# Patient Record
Sex: Female | Born: 1950
Health system: Southern US, Community
[De-identification: ages and names within clinical notes are randomized; demographics above are authoritative.]

## PROBLEM LIST (undated history)

## (undated) DIAGNOSIS — B002 Herpesviral gingivostomatitis and pharyngotonsillitis: Secondary | ICD-10-CM

## (undated) DIAGNOSIS — J309 Allergic rhinitis, unspecified: Secondary | ICD-10-CM

## (undated) DIAGNOSIS — E039 Hypothyroidism, unspecified: Secondary | ICD-10-CM

## (undated) DIAGNOSIS — T7840XA Allergy, unspecified, initial encounter: Secondary | ICD-10-CM

## (undated) DIAGNOSIS — J449 Chronic obstructive pulmonary disease, unspecified: Secondary | ICD-10-CM

## (undated) DIAGNOSIS — H269 Unspecified cataract: Secondary | ICD-10-CM

## (undated) DIAGNOSIS — X32XXXA Exposure to sunlight, initial encounter: Secondary | ICD-10-CM

## (undated) DIAGNOSIS — Z8709 Personal history of other diseases of the respiratory system: Secondary | ICD-10-CM

## (undated) DIAGNOSIS — M8580 Other specified disorders of bone density and structure, unspecified site: Secondary | ICD-10-CM

## (undated) DIAGNOSIS — J45909 Unspecified asthma, uncomplicated: Secondary | ICD-10-CM

## (undated) DIAGNOSIS — T783XXA Angioneurotic edema, initial encounter: Secondary | ICD-10-CM

## (undated) DIAGNOSIS — R911 Solitary pulmonary nodule: Secondary | ICD-10-CM

## (undated) HISTORY — DX: Hypothyroidism, unspecified: E03.9

## (undated) HISTORY — DX: Allergic rhinitis, unspecified: J30.9

## (undated) HISTORY — DX: Allergy, unspecified, initial encounter: T78.40XA

## (undated) HISTORY — PX: COLONOSCOPY: SHX174

## (undated) HISTORY — DX: Angioneurotic edema, initial encounter: T78.3XXA

## (undated) HISTORY — DX: Unspecified cataract: H26.9

## (undated) HISTORY — DX: Other specified disorders of bone density and structure, unspecified site: M85.80

## (undated) HISTORY — PX: OTHER SURGICAL HISTORY: SHX169

## (undated) HISTORY — DX: Chronic obstructive pulmonary disease, unspecified: J44.9

## (undated) HISTORY — DX: Herpesviral gingivostomatitis and pharyngotonsillitis: B00.2

## (undated) HISTORY — DX: Solitary pulmonary nodule: R91.1

## (undated) HISTORY — DX: Exposure to sunlight, initial encounter: X32.XXXA

## (undated) HISTORY — DX: Unspecified asthma, uncomplicated: J45.909

## (undated) HISTORY — DX: Personal history of other diseases of the respiratory system: Z87.09

---
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
## 2006-01-23 DIAGNOSIS — B002 Herpesviral gingivostomatitis and pharyngotonsillitis: Secondary | ICD-10-CM

## 2006-01-23 HISTORY — DX: Herpesviral gingivostomatitis and pharyngotonsillitis: B00.2

## 2008-05-14 LAB — HM MAMMOGRAPHY

## 2009-11-25 LAB — HM MAMMOGRAPHY

## 2011-01-24 DIAGNOSIS — E039 Hypothyroidism, unspecified: Secondary | ICD-10-CM

## 2011-01-24 HISTORY — DX: Hypothyroidism, unspecified: E03.9

## 2011-02-09 LAB — HM MAMMOGRAPHY

## 2011-07-11 DIAGNOSIS — T783XXA Angioneurotic edema, initial encounter: Secondary | ICD-10-CM

## 2011-07-11 HISTORY — DX: Angioneurotic edema, initial encounter: T78.3XXA

## 2012-03-19 LAB — HM MAMMOGRAPHY

## 2012-03-26 LAB — HM MAMMOGRAPHY

## 2013-08-06 ENCOUNTER — Encounter: Payer: Self-pay | Admitting: Internal Medicine

## 2013-09-03 ENCOUNTER — Encounter: Payer: Self-pay | Admitting: Internal Medicine

## 2013-09-11 ENCOUNTER — Ambulatory Visit: Payer: Self-pay | Admitting: Internal Medicine

## 2013-11-06 ENCOUNTER — Ambulatory Visit (INDEPENDENT_AMBULATORY_CARE_PROVIDER_SITE_OTHER): Payer: BC Managed Care – PPO | Admitting: Internal Medicine

## 2013-11-06 ENCOUNTER — Encounter: Payer: Self-pay | Admitting: Internal Medicine

## 2013-11-06 ENCOUNTER — Other Ambulatory Visit (INDEPENDENT_AMBULATORY_CARE_PROVIDER_SITE_OTHER): Payer: BC Managed Care – PPO

## 2013-11-06 VITALS — BP 152/88 | HR 68 | Temp 97.7°F | Ht 66.0 in | Wt 127.5 lb

## 2013-11-06 DIAGNOSIS — Z0000 Encounter for general adult medical examination without abnormal findings: Secondary | ICD-10-CM

## 2013-11-06 DIAGNOSIS — R030 Elevated blood-pressure reading, without diagnosis of hypertension: Secondary | ICD-10-CM | POA: Insufficient documentation

## 2013-11-06 DIAGNOSIS — Z124 Encounter for screening for malignant neoplasm of cervix: Secondary | ICD-10-CM

## 2013-11-06 DIAGNOSIS — B002 Herpesviral gingivostomatitis and pharyngotonsillitis: Secondary | ICD-10-CM | POA: Insufficient documentation

## 2013-11-06 DIAGNOSIS — IMO0001 Reserved for inherently not codable concepts without codable children: Secondary | ICD-10-CM

## 2013-11-06 DIAGNOSIS — E039 Hypothyroidism, unspecified: Secondary | ICD-10-CM

## 2013-11-06 LAB — CBC WITH DIFFERENTIAL/PLATELET
Basophils Absolute: 0 10*3/uL (ref 0.0–0.1)
Basophils Relative: 0.6 % (ref 0.0–3.0)
Eosinophils Absolute: 0.4 10*3/uL (ref 0.0–0.7)
Eosinophils Relative: 6.2 % — ABNORMAL HIGH (ref 0.0–5.0)
HCT: 45.3 % (ref 36.0–46.0)
Hemoglobin: 15.3 g/dL — ABNORMAL HIGH (ref 12.0–15.0)
Lymphocytes Relative: 20.9 % (ref 12.0–46.0)
Lymphs Abs: 1.2 10*3/uL (ref 0.7–4.0)
MCHC: 33.7 g/dL (ref 30.0–36.0)
MCV: 92.8 fl (ref 78.0–100.0)
Monocytes Absolute: 0.5 10*3/uL (ref 0.1–1.0)
Monocytes Relative: 8.5 % (ref 3.0–12.0)
Neutro Abs: 3.7 10*3/uL (ref 1.4–7.7)
Neutrophils Relative %: 63.8 % (ref 43.0–77.0)
Platelets: 245 10*3/uL (ref 150.0–400.0)
RBC: 4.89 Mil/uL (ref 3.87–5.11)
RDW: 12.9 % (ref 11.5–15.5)
WBC: 5.7 10*3/uL (ref 4.0–10.5)

## 2013-11-06 LAB — LIPID PANEL
Cholesterol: 206 mg/dL — ABNORMAL HIGH (ref 0–200)
HDL: 72.9 mg/dL (ref >39.00)
LDL Cholesterol: 122 mg/dL — ABNORMAL HIGH (ref 0–99)
NonHDL: 133.1
Total CHOL/HDL Ratio: 3
Triglycerides: 55 mg/dL (ref 0.0–149.0)
VLDL: 11 mg/dL (ref 0.0–40.0)

## 2013-11-06 LAB — T4, FREE: Free T4: 1.17 ng/dL (ref 0.60–1.60)

## 2013-11-06 LAB — URINALYSIS, ROUTINE W REFLEX MICROSCOPIC
Bilirubin Urine: NEGATIVE
Ketones, ur: NEGATIVE
Leukocytes, UA: NEGATIVE
Nitrite: NEGATIVE
Specific Gravity, Urine: <=1.005 — AB (ref 1.000–1.030)
Total Protein, Urine: NEGATIVE
Urine Glucose: NEGATIVE
Urobilinogen, UA: 0.2 (ref 0.0–1.0)
pH: 7 (ref 5.0–8.0)

## 2013-11-06 LAB — BASIC METABOLIC PANEL
BUN: 13 mg/dL (ref 6–23)
CO2: 24 mEq/L (ref 19–32)
Calcium: 9.4 mg/dL (ref 8.4–10.5)
Chloride: 105 mEq/L (ref 96–112)
Creatinine, Ser: 0.7 mg/dL (ref 0.4–1.2)
GFR: 86.59 mL/min (ref >60.00)
Glucose, Bld: 104 mg/dL — ABNORMAL HIGH (ref 70–99)
Potassium: 3.9 mEq/L (ref 3.5–5.1)
Sodium: 141 mEq/L (ref 135–145)

## 2013-11-06 LAB — HEPATIC FUNCTION PANEL
ALT: 25 U/L (ref 0–35)
AST: 28 U/L (ref 0–37)
Albumin: 4.1 g/dL (ref 3.5–5.2)
Alkaline Phosphatase: 59 U/L (ref 39–117)
Bilirubin, Direct: 0.2 mg/dL (ref 0.0–0.3)
Total Bilirubin: 1.1 mg/dL (ref 0.2–1.2)
Total Protein: 7.5 g/dL (ref 6.0–8.3)

## 2013-11-06 LAB — TSH: TSH: 2.64 u[IU]/mL (ref 0.35–4.50)

## 2013-11-06 NOTE — Patient Instructions (Addendum)
It was good to see you today.  We have reviewed your prior records including labs and tests today  Your annual flu shot was given and/or updated today.  We'll have you screened for colon cancer with COLOGUARD (stool DNA testing) - this packet will be sent to your home by the testing company. Complete instructions as in the box, and we will contact you with the results once available.  Other Health Maintenance reviewed - all other recommended immunizations and age-appropriate screenings are up-to-date.  Test(s) ordered today. Your results will be released to [facility] (or called to you) after review, usually within 72hours after test completion. If any changes need to be made, you will be notified at that same time.  Medications reviewed and updated, no changes recommended at this time. Will look to reduce medications as we are able  we'll make referral to gynecology (or let us know who you would like to see). Our office will contact you regarding appointment(s) once made.  Monitor your blood pressure at home and call if >140/85  Please schedule followup in 12 months for annual exam and labs, call sooner if problems.   Health Maintenance Adopting a healthy lifestyle and getting preventive care can go a long way to promote health and wellness. Talk with your health care provider about what schedule of regular examinations is right for you. This is a good chance for you to check in with your provider about disease prevention and staying healthy. In between checkups, there are plenty of things you can do on your own. Experts have done a lot of research about which lifestyle changes and preventive measures are most likely to keep you healthy. Ask your health care provider for more information. WEIGHT AND DIET  Eat a healthy diet  Be sure to include plenty of vegetables, fruits, low-fat dairy products, and lean protein.  Do not eat a lot of foods high in solid fats, added sugars, or  salt.  Get regular exercise. This is one of the most important things you can do for your health.  Most adults should exercise for at least 150 minutes each week. The exercise should increase your heart rate and make you sweat (moderate-intensity exercise).  Most adults should also do strengthening exercises at least twice a week. This is in addition to the moderate-intensity exercise.  Maintain a healthy weight  Body mass index (BMI) is a measurement that can be used to identify possible weight problems. It estimates body fat based on height and weight. Your health care provider can help determine your BMI and help you achieve or maintain a healthy weight.  For females 43 years of age and older:   A BMI below 18.5 is considered underweight.  A BMI of 18.5 to 24.9 is normal.  A BMI of 25 to 29.9 is considered overweight.  A BMI of 30 and above is considered obese.  Watch levels of cholesterol and blood lipids  You should start having your blood tested for lipids and cholesterol at 64 years of age, then have this test every 5 years.  You may need to have your cholesterol levels checked more often if:  Your lipid or cholesterol levels are high.  You are older than 64 years of age.  You are at high risk for heart disease.  CANCER SCREENING   Lung Cancer  Lung cancer screening is recommended for adults 61-93 years old who are at high risk for lung cancer because of a history of smoking.  A yearly low-dose CT scan of the lungs is recommended for people who:  Currently smoke.  Have quit within the past 15 years.  Have at least a 30-pack-year history of smoking. A pack year is smoking an average of one pack of cigarettes a day for 1 year.  Yearly screening should continue until it has been 15 years since you quit.  Yearly screening should stop if you develop a health problem that would prevent you from having lung cancer treatment.  Breast Cancer  Practice breast  self-awareness. This means understanding how your breasts normally appear and feel.  It also means doing regular breast self-exams. Let your health care provider know about any changes, no matter how small.  If you are in your 20s or 30s, you should have a clinical breast exam (CBE) by a health care provider every 1-3 years as part of a regular health exam.  If you are 46 or older, have a CBE every year. Also consider having a breast X-ray (mammogram) every year.  If you have a family history of breast cancer, talk to your health care provider about genetic screening.  If you are at high risk for breast cancer, talk to your health care provider about having an MRI and a mammogram every year.  Breast cancer gene (BRCA) assessment is recommended for women who have family members with BRCA-related cancers. BRCA-related cancers include:  Breast.  Ovarian.  Tubal.  Peritoneal cancers.  Results of the assessment will determine the need for genetic counseling and BRCA1 and BRCA2 testing. Cervical Cancer Routine pelvic examinations to screen for cervical cancer are no longer recommended for nonpregnant women who are considered low risk for cancer of the pelvic organs (ovaries, uterus, and vagina) and who do not have symptoms. A pelvic examination may be necessary if you have symptoms including those associated with pelvic infections. Ask your health care provider if a screening pelvic exam is right for you.   The Pap test is the screening test for cervical cancer for women who are considered at risk.  If you had a hysterectomy for a problem that was not cancer or a condition that could lead to cancer, then you no longer need Pap tests.  If you are older than 65 years, and you have had normal Pap tests for the past 10 years, you no longer need to have Pap tests.  If you have had past treatment for cervical cancer or a condition that could lead to cancer, you need Pap tests and screening for  cancer for at least 20 years after your treatment.  If you no longer get a Pap test, assess your risk factors if they change (such as having a new sexual partner). This can affect whether you should start being screened again.  Some women have medical problems that increase their chance of getting cervical cancer. If this is the case for you, your health care provider may recommend more frequent screening and Pap tests.  The human papillomavirus (HPV) test is another test that may be used for cervical cancer screening. The HPV test looks for the virus that can cause cell changes in the cervix. The cells collected during the Pap test can be tested for HPV.  The HPV test can be used to screen women 47 years of age and older. Getting tested for HPV can extend the interval between normal Pap tests from three to five years.  An HPV test also should be used to screen women of any  age who have unclear Pap test results.  After 64 years of age, women should have HPV testing as often as Pap tests.  Colorectal Cancer  This type of cancer can be detected and often prevented.  Routine colorectal cancer screening usually begins at 64 years of age and continues through 63 years of age.  Your health care provider may recommend screening at an earlier age if you have risk factors for colon cancer.  Your health care provider may also recommend using home test kits to check for hidden blood in the stool.  A small camera at the end of a tube can be used to examine your colon directly (sigmoidoscopy or colonoscopy). This is done to check for the earliest forms of colorectal cancer.  Routine screening usually begins at age 72.  Direct examination of the colon should be repeated every 5-10 years through 64 years of age. However, you may need to be screened more often if early forms of precancerous polyps or small growths are found. Skin Cancer  Check your skin from head to toe regularly.  Tell your health  care provider about any new moles or changes in moles, especially if there is a change in a mole's shape or color.  Also tell your health care provider if you have a mole that is larger than the size of a pencil eraser.  Always use sunscreen. Apply sunscreen liberally and repeatedly throughout the day.  Protect yourself by wearing long sleeves, pants, a wide-brimmed hat, and sunglasses whenever you are outside. HEART DISEASE, DIABETES, AND HIGH BLOOD PRESSURE   Have your blood pressure checked at least every 1-2 years. High blood pressure causes heart disease and increases the risk of stroke.  If you are between 60 years and 81 years old, ask your health care provider if you should take aspirin to prevent strokes.  Have regular diabetes screenings. This involves taking a blood sample to check your fasting blood sugar level.  If you are at a normal weight and have a low risk for diabetes, have this test once every three years after 64 years of age.  If you are overweight and have a high risk for diabetes, consider being tested at a younger age or more often. PREVENTING INFECTION  Hepatitis B  If you have a higher risk for hepatitis B, you should be screened for this virus. You are considered at high risk for hepatitis B if:  You were born in a country where hepatitis B is common. Ask your health care provider which countries are considered high risk.  Your parents were born in a high-risk country, and you have not been immunized against hepatitis B (hepatitis B vaccine).  You have HIV or AIDS.  You use needles to inject street drugs.  You live with someone who has hepatitis B.  You have had sex with someone who has hepatitis B.  You get hemodialysis treatment.  You take certain medicines for conditions, including cancer, organ transplantation, and autoimmune conditions. Hepatitis C  Blood testing is recommended for:  Everyone born from [birth year] through 1966.  Anyone with known  risk factors for hepatitis C. Sexually transmitted infections (STIs)  You should be screened for sexually transmitted infections (STIs) including gonorrhea and chlamydia if:  You are sexually active and are younger than 64 years of age.  You are older than 64 years of age and your health care provider tells you that you are at risk for this type of infection.  Your sexual  activity has changed since you were last screened and you are at an increased risk for chlamydia or gonorrhea. Ask your health care provider if you are at risk.  If you do not have HIV, but are at risk, it may be recommended that you take a prescription medicine daily to prevent HIV infection. This is called pre-exposure prophylaxis (PrEP). You are considered at risk if:  You are sexually active and do not regularly use condoms or know the HIV status of your partner(s).  You take drugs by injection.  You are sexually active with a partner who has HIV. Talk with your health care provider about whether you are at high risk of being infected with HIV. If you choose to begin PrEP, you should first be tested for HIV. You should then be tested every 3 months for as long as you are taking PrEP.  PREGNANCY   If you are premenopausal and you may become pregnant, ask your health care provider about preconception counseling.  If you may become pregnant, take 400 to 800 micrograms (mcg) of folic acid every day.  If you want to prevent pregnancy, talk to your health care provider about birth control (contraception). OSTEOPOROSIS AND MENOPAUSE   Osteoporosis is a disease in which the bones lose minerals and strength with aging. This can result in serious bone fractures. Your risk for osteoporosis can be identified using a bone density scan.  If you are 27 years of age or older, or if you are at risk for osteoporosis and fractures, ask your health care provider if you should be screened.  Ask your health care provider whether you  should take a calcium or vitamin D supplement to lower your risk for osteoporosis.  Menopause may have certain physical symptoms and risks.  Hormone replacement therapy may reduce some of these symptoms and risks. Talk to your health care provider about whether hormone replacement therapy is right for you.  HOME CARE INSTRUCTIONS   Schedule regular health, dental, and eye exams.  Stay current with your immunizations.   Do not use any tobacco products including cigarettes, chewing tobacco, or electronic cigarettes.  If you are pregnant, do not drink alcohol.  If you are breastfeeding, limit how much and how often you drink alcohol.  Limit alcohol intake to no more than 1 drink per day for nonpregnant women. One drink equals 12 ounces of beer, 5 ounces of wine, or 1 ounces of hard liquor.  Do not use street drugs.  Do not share needles.  Ask your health care provider for help if you need support or information about quitting drugs.  Tell your health care provider if you often feel depressed.  Tell your health care provider if you have ever been abused or do not feel safe at home. Document Released: 07/25/2010 Document Revised: 05/26/2013 Document Reviewed: 12/11/2012 [facility] Patient Information 2015 [place], [place]. This information is not intended to replace advice given to you by your health care provider. Make sure you discuss any questions you have with your health care provider.

## 2013-11-06 NOTE — Progress Notes (Signed)
Pre visit review using our clinic review tool, if applicable. No additional management support is needed unless otherwise documented below in the visit note. 

## 2013-11-06 NOTE — Assessment & Plan Note (Signed)
Remains unchanged on recheck Suspect element of whitecoat hypertension as patient reports home blood pressure 120s over 70s Patient will continue to monitor at home and send copy of log via my chart Patient agrees to contact us if systolic greater than 756 and given family history of hypertension

## 2013-11-06 NOTE — Progress Notes (Signed)
Subjective:    Patient ID: Tina Lucas, female    DOB: 07-30-1950, 63 y.o.   MRN: [id number]  HPI  patient is here today for annual physical. Patient feels well and has no complaints.  Also reviewed chronic medical issues and interval medical events Would very much like to reduce prescription medications, specifically thyroid Embraces holistic approach to health -physically active with conscientious diet  Past Medical History  Diagnosis Date  . Hypothyroid 2013  . History of asthma 1956-2000  . 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 Primary HSV infection of mouth 2008  . Allergic rhinitis    Family History  Problem Relation Age of Onset  . Osteoarthritis Father   . Hypertension Father   . Breast cancer Mother 49  . Leukemia Maternal Aunt 45   History  Substance Use Topics  . Smoking status: Not on file  . Smokeless tobacco: Not on file  . Alcohol Use: Not on file    Review of Systems  Constitutional: Negative for fatigue and unexpected weight change.  Respiratory: Negative for cough, shortness of breath and wheezing.   Cardiovascular: Negative for chest pain, palpitations and leg swelling.  Gastrointestinal: Negative for nausea, abdominal pain and diarrhea.  Neurological: Negative for dizziness, weakness, light-headedness and headaches.  Psychiatric/Behavioral: Negative for dysphoric mood. The patient is not nervous/anxious.   All other systems reviewed and are negative.      Objective:   Physical Exam  BP 152/88  Pulse 68  Temp(Src) 97.7 F (36.5 C) (Oral)  Ht 5\' 6"  (1.676 m)  Wt 127 lb 8 oz (57.834 kg)  BMI 20.59 kg/m2  SpO2 97% Wt Readings from Last 3 Encounters:  11/06/13 127 lb 8 oz (57.834 kg)   Constitutional: She appears well-developed and well-nourished. No distress.  HENT: Head: Normocephalic and atraumatic. Ears: B TMs ok, no erythema or effusion; Nose: Nose normal. Mouth/Throat:  Oropharynx is clear and moist. No oropharyngeal exudate.  Eyes: Conjunctivae and EOM are normal. Pupils are equal, round, and reactive to light. No scleral icterus.  Neck: Normal range of motion. Neck supple. No JVD present. No thyromegaly present.  Cardiovascular: Normal rate, regular rhythm and normal heart sounds.  No murmur heard. No BLE edema. Pulmonary/Chest: Effort normal and breath sounds normal. No respiratory distress. She has no wheezes.  Abdominal: Soft. Bowel sounds are normal. She exhibits no distension. There is no tenderness. no masses GU/breast: defer to gyn Musculoskeletal: Normal range of motion, no joint effusions. No gross deformities Neurological: She is alert and oriented to person, place, and time. No cranial nerve deficit. Coordination, balance, strength, speech and gait are normal.  Skin: eczema type changes below eyebrows and below eyes. Remaining skin is warm and dry. No rash noted. No erythema.  Psychiatric: She has a normal mood and affect. Her behavior is normal. Judgment and thought content normal.    No results found for this basename: WBC,  HGB,  HCT,  PLT,  GLUCOSE,  CHOL,  TRIG,  HDL,  LDLDIRECT,  LDLCALC,  ALT,  AST,  NA,  K,  CL,  CREATININE,  BUN,  CO2,  TSH,  PSA,  INR,  GLUF,  HGBA1C,  MICROALBUR    Patient was never admitted.     Assessment & Plan:   CPX/z00.00 - Patient has been counseled on age-appropriate routine health concerns for screening and prevention. These are reviewed and up-to-date. Immunizations are up-to-date or declined. Labs ordered and reviewed.  Problem  List Items Addressed This Visit   Elevated blood pressure     Remains unchanged on recheck Suspect element of whitecoat hypertension as patient reports home blood pressure 120s over 70s Patient will continue to monitor at home and send copy of log via my chart Patient agrees to contact us if systolic greater than 720 and given family history of hypertension    Hypothyroid        Diagnosed 2013 remains skeptical of need for replacement given complications with skin/rash which onset after initiation of levothyroxin Check TSH and free T4, reduce dose as able No results found for this basename: TSH       Relevant Medications      levothyroxine (SYNTHROID, LEVOTHROID) 50 MCG tablet   Other Relevant Orders      TSH      T4, free    Other Visit Diagnoses   Routine general medical examination at a health care facility    -  Primary    Relevant Orders       Basic metabolic panel       CBC with Differential       Hepatic function panel       Lipid panel       TSH       Urinalysis, Routine w reflex microscopic    Screening for cervical cancer        Relevant Orders       Ambulatory referral to Obstetrics / Gynecology

## 2013-11-06 NOTE — Assessment & Plan Note (Signed)
Diagnosed 2013 remains skeptical of need for replacement given complications with skin/rash which onset after initiation of levothyroxin Check TSH and free T4, reduce dose as able No results found for this basename: TSH

## 2013-11-07 ENCOUNTER — Other Ambulatory Visit: Payer: Self-pay | Admitting: *Deleted

## 2013-11-07 MED ORDER — LEVOTHYROXINE SODIUM 25 MCG PO TABS: 25.0000 ug | ORAL_TABLET | Freq: Every day | ORAL | Status: DC

## 2013-11-07 NOTE — Telephone Encounter (Signed)
Called pt gave her md response concerning labs. MD reduce synthroid to 25 mcg. Pt needing script sent to [facility]...Johny Chess

## 2013-12-12 ENCOUNTER — Encounter: Payer: Self-pay | Admitting: Internal Medicine

## 2014-02-10 ENCOUNTER — Other Ambulatory Visit (INDEPENDENT_AMBULATORY_CARE_PROVIDER_SITE_OTHER): Payer: BC Managed Care – PPO

## 2014-02-10 ENCOUNTER — Encounter: Payer: Self-pay | Admitting: Family

## 2014-02-10 ENCOUNTER — Ambulatory Visit (INDEPENDENT_AMBULATORY_CARE_PROVIDER_SITE_OTHER): Payer: BC Managed Care – PPO | Admitting: Family

## 2014-02-10 VITALS — BP 142/80 | HR 75 | Temp 98.4°F | Resp 18 | Ht 67.5 in | Wt 129.2 lb

## 2014-02-10 DIAGNOSIS — R42 Dizziness and giddiness: Secondary | ICD-10-CM

## 2014-02-10 LAB — TSH: TSH: 3.96 u[IU]/mL (ref 0.35–4.50)

## 2014-02-10 NOTE — Assessment & Plan Note (Signed)
Idiopathic dizziness at this time. Possibly related to previous illness. Obtain thyroid to rule out cause. Dix-Hallpike maneuver with some increased sensation with head straight. Discussed use of over-the-counter meclizine as needed. Follow-up if symptoms worsen or fail to improve.

## 2014-02-10 NOTE — Patient Instructions (Signed)
Thank you for choosing [facility].  Summary/Instructions:  Referrals have been made during this visit. You should expect to hear back from our schedulers in about 7-10 days in regards to establishing an appointment with the specialists we discussed.   If your symptoms worsen or fail to improve, please contact our office for further instruction, or in case of emergency go directly to the emergency room at the closest medical facility.   Dizziness Dizziness is a common problem. It is a feeling of unsteadiness or light-headedness. You may feel like you are about to faint. Dizziness can lead to injury if you stumble or fall. A person of any age group can suffer from dizziness, but dizziness is more common in older adults. CAUSES  Dizziness can be caused by many different things, including:  Middle ear problems.  Standing for too long.  Infections.  An allergic reaction.  Aging.  An emotional response to something, such as the sight of blood.  Side effects of medicines.  Tiredness.  Problems with circulation or blood pressure.  Excessive use of alcohol or medicines, or illegal drug use.  Breathing too fast (hyperventilation).  An irregular heart rhythm (arrhythmia).  A low red blood cell count (anemia).  Pregnancy.  Vomiting, diarrhea, fever, or other illnesses that cause body fluid loss (dehydration).  Diseases or conditions such as Parkinson's disease, high blood pressure (hypertension), diabetes, and thyroid problems.  Exposure to extreme heat. DIAGNOSIS  Your health care provider will ask about your symptoms, perform a physical exam, and perform an electrocardiogram (ECG) to record the electrical activity of your heart. Your health care provider may also perform other heart or blood tests to determine the cause of your dizziness. These may include:  Transthoracic echocardiogram (TTE). During echocardiography, sound waves are used to evaluate how blood flows  through your heart.  Transesophageal echocardiogram (TEE).  Cardiac monitoring. This allows your health care provider to monitor your heart rate and rhythm in real time.  Holter monitor. This is a portable device that records your heartbeat and can help diagnose heart arrhythmias. It allows your health care provider to track your heart activity for several days if needed.  Stress tests by exercise or by giving medicine that makes the heart beat faster. TREATMENT  Treatment of dizziness depends on the cause of your symptoms and can vary greatly. HOME CARE INSTRUCTIONS   Drink enough fluids to keep your urine clear or pale yellow. This is especially important in very hot weather. In older adults, it is also important in cold weather.  Take your medicine exactly as directed if your dizziness is caused by medicines. When taking blood pressure medicines, it is especially important to get up slowly.  Rise slowly from chairs and steady yourself until you feel okay.  In the morning, first sit up on the side of the bed. When you feel okay, stand slowly while holding onto something until you know your balance is fine.  Move your legs often if you need to stand in one place for a long time. Tighten and relax your muscles in your legs while standing.  Have someone stay with you for 1-2 days if dizziness continues to be a problem. Do this until you feel you are well enough to stay alone. Have the person call your health care provider if he or she notices changes in you that are concerning.  Do not drive or use heavy machinery if you feel dizzy.  Do not drink alcohol. [place]  CARE IF:   Your dizziness or light-headedness gets worse.  You feel nauseous or vomit.  You have problems talking, walking, or using your arms, hands, or legs.  You feel weak.  You are not thinking clearly or you have trouble forming sentences. It may take a friend or family member to notice this.  You  have chest pain, abdominal pain, shortness of breath, or sweating.  Your vision changes.  You notice any bleeding.  You have side effects from medicine that seems to be getting worse rather than better. MAKE SURE YOU:   Understand these instructions.  Will watch your condition.  Will get help right away if you are not doing well or get worse. Document Released: 07/05/2000 Document Revised: 01/14/2013 Document Reviewed: 07/29/2010 [facility] Patient Information 2015 [place], [place]. This information is not intended to replace advice given to you by your health care provider. Make sure you discuss any questions you have with your health care provider.

## 2014-02-10 NOTE — Progress Notes (Signed)
Pre visit review using our clinic review tool, if applicable. No additional management support is needed unless otherwise documented below in the visit note. 

## 2014-02-10 NOTE — Progress Notes (Signed)
   Subjective:    Patient ID: Tina Lucas, female    DOB: [birth year], 64 y.o.   MRN: [id number]  Chief Complaint  Patient presents with  . Light headed    has been feeling off balance for about 2 weeks, she said she thinks it might have veritgo, has even gotten so light headed that she has thrown up at one point    HPI:  Tina Lucas is a 64 y.o. female who presents today for an acute dizziness.  Dizziness and associated symptoms of disquilibrium and lightheadednes are described as feeling off balance has been going on for about 1.5 weeks. Intensity has been high enough that she threw up at one point. Indicates she was sick approximately 10-15 days ago.  No Known Allergies   Current Outpatient Prescriptions on File Prior to Visit  Medication Sig Dispense Refill  . Biotin 3 MG TABS Take by mouth 2 (two) times a week.    . Calcium Carbonate-Vitamin D (CALTRATE 600+D) 600-400 MG-UNIT per tablet Take 1 tablet by mouth daily.    . cholecalciferol (VITAMIN D) 400 UNITS TABS tablet Take 400 Units by mouth.    . famciclovir (FAMVIR) 500 MG tablet Take 500 mg by mouth 2 (two) times a week.    . fexofenadine (ALLEGRA) 180 MG tablet Take 180 mg by mouth as needed for allergies or rhinitis.    . Lactobacillus (ACIDOPHILUS/BIFIDUS PO) Take by mouth daily.    Marland Kitchen levothyroxine (SYNTHROID, LEVOTHROID) 25 MCG tablet Take 1 tablet (25 mcg total) by mouth daily before breakfast. 90 tablet 3  . Misc Natural Products (TART CHERRY ADVANCED PO) Take 1,000 mg by mouth.    . Multiple Vitamins-Minerals (ONE-A-DAY WOMENS 50+ ADVANTAGE PO) Take by mouth daily.     No current facility-administered medications on file prior to visit.     Review of Systems  Constitutional: Negative for fever and chills.  HENT: Negative for ear pain.   Respiratory: Negative for chest tightness and shortness of breath.   Cardiovascular: Negative for chest pain, palpitations and leg swelling.  Neurological: Positive for dizziness.  Negative for syncope and weakness.      Objective:    BP 142/80 mmHg  Pulse 75  Temp(Src) 98.4 F (36.9 C) (Oral)  Resp 18  Ht 5' 7.5" (1.715 m)  Wt 129 lb 3.2 oz (58.605 kg)  BMI 19.93 kg/m2  SpO2 95% Nursing note and vital signs reviewed.  Physical Exam  Constitutional: She is oriented to person, place, and time. She appears well-developed and well-nourished. No distress.  HENT:  Right Ear: Hearing, tympanic membrane, external ear and ear canal normal.  Left Ear: Hearing, tympanic membrane, external ear and ear canal normal.  Mouth/Throat: Oropharynx is clear and moist.  Eyes: Conjunctivae and EOM are normal. Pupils are equal, round, and reactive to light.  Cardiovascular: Normal rate, regular rhythm, normal heart sounds and intact distal pulses.   Pulmonary/Chest: Effort normal and breath sounds normal.  Neurological: She is alert and oriented to person, place, and time. She has normal reflexes.  Skin: Skin is warm and dry.  Psychiatric: She has a normal mood and affect. Her behavior is normal. Judgment and thought content normal.       Assessment & Plan:

## 2014-02-20 ENCOUNTER — Encounter: Payer: Self-pay | Admitting: Internal Medicine

## 2014-02-20 DIAGNOSIS — R2689 Other abnormalities of gait and mobility: Secondary | ICD-10-CM

## 2014-02-21 MED ORDER — LEVOTHYROXINE SODIUM 50 MCG PO TABS: 50.0000 ug | ORAL_TABLET | Freq: Every day | ORAL | Status: DC

## 2014-03-03 ENCOUNTER — Telehealth: Payer: Self-pay | Admitting: Family

## 2014-03-03 ENCOUNTER — Other Ambulatory Visit: Payer: Self-pay | Admitting: Family

## 2014-03-03 DIAGNOSIS — R42 Dizziness and giddiness: Secondary | ICD-10-CM

## 2014-03-03 NOTE — Telephone Encounter (Signed)
Referral completed

## 2014-03-03 NOTE — Telephone Encounter (Signed)
Patient would like to see ENT for her vertigo. The physical therapist we sent her to has not called yet and she states the vertigo is worsening. She spoke w/someone at [facility] and was advised they could see her tomorrow, but she would need a referral from [name]. Please advise.

## 2014-03-09 ENCOUNTER — Ambulatory Visit: Payer: BC Managed Care – PPO | Admitting: Sports Medicine

## 2014-04-01 ENCOUNTER — Ambulatory Visit (INDEPENDENT_AMBULATORY_CARE_PROVIDER_SITE_OTHER): Payer: BC Managed Care – PPO | Admitting: Family

## 2014-04-01 ENCOUNTER — Encounter: Payer: Self-pay | Admitting: Family

## 2014-04-01 VITALS — BP 140/88 | HR 77 | Temp 98.0°F | Resp 18 | Wt 131.8 lb

## 2014-04-01 DIAGNOSIS — R51 Headache: Secondary | ICD-10-CM

## 2014-04-01 DIAGNOSIS — R519 Headache, unspecified: Secondary | ICD-10-CM | POA: Insufficient documentation

## 2014-04-01 NOTE — Assessment & Plan Note (Addendum)
Patient describes pressure in her head following treatment with the Eply maneuver. Recommend for evaluation by physical therapy to determine if this is related to the Eply maneuver or other mechanism. Recommend trialing Sudafed for generalized congestion and Naprosyn as needed for inflammation. Follow up if symptoms worsen or fail to improve and further imaging will be considered and possible referral to neurology.

## 2014-04-01 NOTE — Patient Instructions (Addendum)
Thank you for choosing [facility].  Summary/Instructions:  If your symptoms worsen or fail to improve, please contact our office for further instruction, or in case of emergency go directly to the emergency room at the closest medical facility.   Please contact physical therapist for a follow up to determine if this is related to the eply maneuver or muscle skeletal related.   Recommend trying sudafed to if needed or ibuprofen.

## 2014-04-01 NOTE — Progress Notes (Signed)
Subjective:    Patient ID: Tina Lucas, female    DOB: 09-09-1950, 64 y.o.   MRN: [id number]  Chief Complaint  Patient presents with  . Unbalanced    pt states that she is feeling unbalanced still and has head pressure in the back of her head that will not go away, not having dizziness anymore was diagnosed with vertigo, head pressure has been going on for a month     HPI:  Tina Lucas is a 64 y.o. female who presents today for follow up.  Patient was recently seen for dizziness and disequilibrium. She was seen by ENT and Audiology which helped to relieve her symptoms. Indicates that she is no longer feeling dizziness.   Indicates that she continues to experience the associated symptom of feeling unbalanced and has head pressure that is located in the back of her head. This has been going on for about a month. Notes that the pressure is present all the time and is worsened with exercise. Has not taken any medications or any modifying factors.    No Known Allergies   Current Outpatient Prescriptions on File Prior to Visit  Medication Sig Dispense Refill  . Biotin 3 MG TABS Take by mouth 2 (two) times a week.    . Calcium Carbonate-Vitamin D (CALTRATE 600+D) 600-400 MG-UNIT per tablet Take 1 tablet by mouth daily.    . cholecalciferol (VITAMIN D) 400 UNITS TABS tablet Take 400 Units by mouth.    . famciclovir (FAMVIR) 500 MG tablet Take 500 mg by mouth 2 (two) times a week.    . fexofenadine (ALLEGRA) 180 MG tablet Take 180 mg by mouth as needed for allergies or rhinitis.    . Lactobacillus (ACIDOPHILUS/BIFIDUS PO) Take by mouth daily.    Marland Kitchen levothyroxine (SYNTHROID, LEVOTHROID) 50 MCG tablet Take 1 tablet (50 mcg total) by mouth daily before breakfast. 30 tablet 5  . Misc Natural Products (TART CHERRY ADVANCED PO) Take 1,000 mg by mouth.    . Multiple Vitamins-Minerals (ONE-A-DAY WOMENS 50+ ADVANTAGE PO) Take by mouth daily.     No current facility-administered medications on file  prior to visit.    Past Medical History  Diagnosis Date  . Hypothyroid 2013  . History of asthma 1956-2000  . 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 Primary HSV infection of mouth 2008  . Allergic rhinitis     Review of Systems  Constitutional: Negative for fever, chills and activity change.  Musculoskeletal: Negative for neck pain.  Neurological: Negative for tremors, weakness, light-headedness and headaches.      Objective:    BP 140/88 mmHg  Pulse 77  Temp(Src) 98 F (36.7 C) (Oral)  Resp 18  Wt 131 lb 12.8 oz (59.784 kg)  SpO2 99% Nursing note and vital signs reviewed.  Physical Exam  Constitutional: She is oriented to person, place, and time. She appears well-developed and well-nourished. No distress.  Neck: Neck supple.  Cardiovascular: Normal rate, regular rhythm, normal heart sounds and intact distal pulses.   Pulmonary/Chest: Effort normal and breath sounds normal.  Musculoskeletal:  No obvious deformity, discoloration, or edema of neck noted. Pressure is located along cervical and occipital junction just below the occiput. Neck range of motion is full and complete. Patient does describe some increased pressure with forward flexion and lateral bending in the combination of both movements. Negative Spurling's.  Neurological: She is alert and oriented to person, place, and time. She has normal reflexes.  No cranial nerve deficit. She exhibits normal muscle tone. Coordination normal.  Skin: Skin is warm and dry.  Psychiatric: She has a normal mood and affect. Her behavior is normal. Judgment and thought content normal.       Assessment & Plan:

## 2014-04-01 NOTE — Progress Notes (Signed)
Pre visit review using our clinic review tool, if applicable. No additional management support is needed unless otherwise documented below in the visit note. 

## 2014-04-13 ENCOUNTER — Telehealth: Payer: Self-pay | Admitting: Internal Medicine

## 2014-04-13 DIAGNOSIS — R519 Headache, unspecified: Secondary | ICD-10-CM

## 2014-04-13 DIAGNOSIS — R51 Headache: Principal | ICD-10-CM

## 2014-04-13 NOTE — Telephone Encounter (Signed)
Pt called in and said that she would like to have the CT done at [facility] .  The back of her head is still hurting.       She would like to have the ct done asap

## 2014-04-13 NOTE — Telephone Encounter (Signed)
CT ordered. 

## 2014-04-14 NOTE — Telephone Encounter (Signed)
Pt aware.

## 2014-04-16 ENCOUNTER — Encounter: Payer: Self-pay | Admitting: Family

## 2014-04-27 ENCOUNTER — Ambulatory Visit (HOSPITAL_COMMUNITY)
Admission: RE | Admit: 2014-04-27 | Discharge: 2014-04-27 | Disposition: A | Discharge status: Home / Self Care | Payer: BC Managed Care – PPO | Source: Ambulatory Visit | Attending: Family | Admitting: Family

## 2014-04-27 DIAGNOSIS — R51 Headache: Secondary | ICD-10-CM | POA: Diagnosis present

## 2014-04-27 DIAGNOSIS — R519 Headache, unspecified: Secondary | ICD-10-CM

## 2014-04-28 ENCOUNTER — Telehealth: Payer: Self-pay

## 2014-04-28 DIAGNOSIS — R51 Headache: Principal | ICD-10-CM

## 2014-04-28 DIAGNOSIS — R519 Headache, unspecified: Secondary | ICD-10-CM

## 2014-04-28 NOTE — Telephone Encounter (Signed)
Jane with [facility] called and the CT scan for pt is back. The called in due to further evaluation via MRI needed.  Please advise

## 2014-04-28 NOTE — Telephone Encounter (Signed)
Spoke with patient on the phone regarding CT results and questions were answered. MRI order placed.

## 2014-04-29 ENCOUNTER — Encounter: Payer: Self-pay | Admitting: Internal Medicine

## 2014-04-29 NOTE — Telephone Encounter (Signed)
Please check on timing of MRI - sooner than later is certainly appreciated thanks

## 2014-04-30 ENCOUNTER — Other Ambulatory Visit: Payer: Self-pay | Admitting: Family

## 2014-04-30 DIAGNOSIS — R51 Headache: Principal | ICD-10-CM

## 2014-04-30 DIAGNOSIS — R519 Headache, unspecified: Secondary | ICD-10-CM

## 2014-04-30 NOTE — Telephone Encounter (Signed)
Changed the priority of the MRI to Urgent due to sx

## 2014-04-30 NOTE — Telephone Encounter (Signed)
(  continuation of message below) due to sx and impression of CT Scan wo contrast.

## 2014-05-01 ENCOUNTER — Telehealth: Payer: Self-pay | Admitting: Internal Medicine

## 2014-05-01 ENCOUNTER — Other Ambulatory Visit: Payer: Self-pay | Admitting: Family

## 2014-05-01 DIAGNOSIS — R51 Headache: Principal | ICD-10-CM

## 2014-05-01 DIAGNOSIS — R519 Headache, unspecified: Secondary | ICD-10-CM

## 2014-05-01 NOTE — Telephone Encounter (Signed)
She needs a lab order request before her MRI on Monday. Please call [facility]

## 2014-05-01 NOTE — Telephone Encounter (Signed)
Orders placed and message sent to patient.

## 2014-05-01 NOTE — Telephone Encounter (Signed)
Continuing from below: She leaves at 4:30 and you can call the main number [phone]. Whoever answers can help.

## 2014-05-04 ENCOUNTER — Encounter: Payer: Self-pay | Admitting: Family

## 2014-05-04 ENCOUNTER — Ambulatory Visit
Admission: RE | Admit: 2014-05-04 | Discharge: 2014-05-04 | Disposition: A | Discharge status: Home / Self Care | Payer: BC Managed Care – PPO | Source: Ambulatory Visit | Attending: Family | Admitting: Family

## 2014-05-04 ENCOUNTER — Other Ambulatory Visit (INDEPENDENT_AMBULATORY_CARE_PROVIDER_SITE_OTHER): Payer: BC Managed Care – PPO

## 2014-05-04 DIAGNOSIS — R519 Headache, unspecified: Secondary | ICD-10-CM

## 2014-05-04 DIAGNOSIS — R51 Headache: Secondary | ICD-10-CM

## 2014-05-04 LAB — BASIC METABOLIC PANEL
BUN: 14 mg/dL (ref 6–23)
CO2: 31 mEq/L (ref 19–32)
Calcium: 9.7 mg/dL (ref 8.4–10.5)
Chloride: 101 mEq/L (ref 96–112)
Creatinine, Ser: 0.67 mg/dL (ref 0.40–1.20)
GFR: 93.94 mL/min (ref >60.00)
Glucose, Bld: 97 mg/dL (ref 70–99)
Potassium: 3.8 mEq/L (ref 3.5–5.1)
Sodium: 139 mEq/L (ref 135–145)

## 2014-05-04 MED ORDER — GADOBENATE DIMEGLUMINE 529 MG/ML IV SOLN
11.0000 mL | Freq: Once | INTRAVENOUS | Status: AC | PRN
  Administered 2014-05-04: 11 mL via INTRAVENOUS

## 2014-05-20 ENCOUNTER — Encounter: Payer: Self-pay | Admitting: Family

## 2014-05-20 DIAGNOSIS — R51 Headache: Principal | ICD-10-CM

## 2014-05-20 DIAGNOSIS — R519 Headache, unspecified: Secondary | ICD-10-CM

## 2014-06-08 ENCOUNTER — Encounter: Payer: Self-pay | Admitting: Neurology

## 2014-06-08 ENCOUNTER — Ambulatory Visit (INDEPENDENT_AMBULATORY_CARE_PROVIDER_SITE_OTHER): Payer: BC Managed Care – PPO | Admitting: Neurology

## 2014-06-08 VITALS — BP 160/88 | HR 66 | Ht 67.0 in | Wt 131.4 lb

## 2014-06-08 DIAGNOSIS — R202 Paresthesia of skin: Secondary | ICD-10-CM | POA: Diagnosis not present

## 2014-06-08 DIAGNOSIS — F411 Generalized anxiety disorder: Secondary | ICD-10-CM

## 2014-06-08 DIAGNOSIS — G44209 Tension-type headache, unspecified, not intractable: Secondary | ICD-10-CM

## 2014-06-08 NOTE — Progress Notes (Signed)
Tina Lucas Note - Initial Visit   Date: 06/08/2014   Tina Lucas MRN: [id number] DOB: [birth year]   Dear Dr. Asa Lente:  Thank you for your kind referral of Tina Lucas for consultation of head pressure. Although her history is well known to you, please allow Tina Lucas to reiterate it for the purpose of our medical record. The patient was accompanied to the Lucas by husband who also provides collateral information.     History of Present Illness: Tina Lucas is a 64 y.o. right-handed Caucasian female with hypothyroidism presenting for evaluation of dizziness and head pressure.    In January 2016, she has a spell of vertigo and was evaluated by ENT.  She underwent Epley's maneuver which successfully resolved her symptoms.  Starting in February 2016, she developed pressure at the base of her head and imbalance.  There has been no associated falls.  She has a constant sense of heaviness at the base of her head.  No identifiable triggers, exacerbating, or alleviating factors.  She has not taken any tylenol or ibuprofen.  MRI brain did not show any acute findings.    In April 2016, she developed numbness of the right foot which is intermittent.  She stays very active playing sports and has a master's degree in exercise physiology.  She plays pickle ball and plays competitive.  She reports having a very type A personality and extremely worried that she may have ALS or other terrible neurological disease.   Out-side paper records, electronic medical record, and images have been reviewed where available and summarized as:  MRI brain wwo contrast 05/04/2014: Mild chronic white matter changes most likely related to microvascular ischemia. No acute infarct or mass. Midbrain hypodensity appears normal by MRI and appears artifactual on the prior CT. Mild mucosal edema paranasal sinuses.   Labs 02/10/2014:  TSH 3.96  Past Medical History  Diagnosis Date  . Hypothyroid  2013  . History of asthma 1956-2000  . 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 Primary HSV infection of mouth 2008  . Allergic rhinitis     Past Surgical History  Procedure Laterality Date  . Tonsillectomy  1958     Medications:  Current Outpatient Prescriptions on File Prior to Visit  Medication Sig Dispense Refill  . Biotin 3 MG TABS Take by mouth 2 (two) times a week.    . Calcium Carbonate-Vitamin D (CALTRATE 600+D) 600-400 MG-UNIT per tablet Take 1 tablet by mouth daily.    . cholecalciferol (VITAMIN D) 400 UNITS TABS tablet Take 400 Units by mouth.    . famciclovir (FAMVIR) 500 MG tablet Take 500 mg by mouth 2 (two) times a week.    . fexofenadine (ALLEGRA) 180 MG tablet Take 180 mg by mouth as needed for allergies or rhinitis.    . Lactobacillus (ACIDOPHILUS/BIFIDUS PO) Take by mouth daily.    Marland Kitchen levothyroxine (SYNTHROID, LEVOTHROID) 50 MCG tablet Take 1 tablet (50 mcg total) by mouth daily before breakfast. 30 tablet 5  . Misc Natural Products (TART CHERRY ADVANCED PO) Take 1,000 mg by mouth.    . Multiple Vitamins-Minerals (ONE-A-DAY WOMENS 50+ ADVANTAGE PO) Take by mouth daily.     No current facility-administered medications on file prior to visit.    Allergies: No Known Allergies  Family History: Family History  Problem Relation Age of Onset  . Osteoarthritis Father   . Hypertension Father   . Breast cancer Mother 54  . Leukemia Maternal  Aunt 22    Social History: History   Social History  . Marital Status: Married    Spouse Name: N/A  . Number of Children: N/A  . Years of Education: N/A   Occupational History  . Not on file.   Social History Main Topics  . Smoking status: Never Smoker   . Smokeless tobacco: Never Used  . Alcohol Use: 0.0 oz/week    0 Standard drinks or equivalent per week  . Drug Use: No  . Sexual Activity: Not on file   Other Topics Concern  . Not on file   Social  History Narrative   Lives with husband in a one story home.  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    Review of Systems:  CONSTITUTIONAL: No fevers, chills, night sweats, or weight loss.   EYES: No visual changes or eye pain ENT: No hearing changes.  No history of nose bleeds.   RESPIRATORY: No cough, wheezing and shortness of breath.   CARDIOVASCULAR: Negative for chest pain, and palpitations.   GI: Negative for abdominal discomfort, blood in stools or black stools.  No recent change in bowel habits.   GU:  No history of incontinence.   MUSCLOSKELETAL: No history of joint pain or swelling.  No myalgias.   SKIN: Negative for lesions, rash, and itching.   HEMATOLOGY/ONCOLOGY: Negative for prolonged bleeding, bruising easily, and swollen nodes.  No history of cancer.   ENDOCRINE: Negative for cold or heat intolerance, polydipsia or goiter.   PSYCH:  No depression ++anxiety symptoms.   NEURO: As Above.   Vital Signs:  BP 160/88 mmHg  Pulse 66  Ht 5\' 7"  (1.702 m)  Wt 131 lb 6 oz (59.591 kg)  BMI 20.57 kg/m2  SpO2 99%  Neurological Exam: MENTAL STATUS including orientation to time, place, person, recent and remote memory, attention span and concentration, language, and fund of knowledge is normal. She is highly anxious and speech is pressured at times.  CRANIAL NERVES: II:  No visual field defects.  Unremarkable fundi.   III-IV-VI: Pupils equal round and reactive to light.  Normal conjugate, extra-ocular eye movements in all directions of gaze.  No nystagmus.  No ptosis.   V:  Normal facial sensation.  Jaw jerk is absent.   VII:  Normal facial symmetry and movements.  No pathologic facial reflexes.  VIII:  Normal hearing and vestibular function.   IX-X:  Normal palatal movement.   XI:  Normal shoulder shrug and head rotation.   XII:  Normal tongue strength and range of motion, no deviation or fasciculation.  MOTOR:  No atrophy, fasciculations or abnormal movements.   No pronator drift.  Tone is normal.    Right Upper Extremity:    Left Upper Extremity:    Deltoid  5/5   Deltoid  5/5   Biceps  5/5   Biceps  5/5   Triceps  5/5   Triceps  5/5   Wrist extensors  5/5   Wrist extensors  5/5   Wrist flexors  5/5   Wrist flexors  5/5   Finger extensors  5/5   Finger extensors  5/5   Finger flexors  5/5   Finger flexors  5/5   Dorsal interossei  5/5   Dorsal interossei  5/5   Abductor pollicis  5/5   Abductor pollicis  5/5   Tone (Ashworth scale)  0  Tone (Ashworth scale)  0   Right Lower Extremity:  Left Lower Extremity:    Hip flexors  5/5   Hip flexors  5/5   Hip extensors  5/5   Hip extensors  5/5   Knee flexors  5/5   Knee flexors  5/5   Knee extensors  5/5   Knee extensors  5/5   Dorsiflexors  5/5   Dorsiflexors  5/5   Plantarflexors  5/5   Plantarflexors  5/5   Toe extensors  5/5   Toe extensors  5/5   Toe flexors  5/5   Toe flexors  5/5   Tone (Ashworth scale)  0  Tone (Ashworth scale)  0   MSRs:  Right                                                                 Left brachioradialis 2+  brachioradialis 2+  biceps 2+  biceps 2+  triceps 2+  triceps 2+  patellar 2+  patellar 2+  ankle jerk 2+  ankle jerk 2+  Hoffman no  Hoffman no  plantar response down  plantar response down   SENSORY:  Normal and symmetric perception of light touch, pinprick, vibration, and proprioception.  Romberg's sign absent.   COORDINATION/GAIT: Normal finger-to- nose-finger and heel-to-shin.  Intact rapid alternating movements bilaterally.  Able to rise from a chair without using arms.  Gait narrow based and stable. Tandem and stressed gait intact.    IMPRESSION: Ms. Gibbon is a 64 year-old female presenting for evaluation of imbalance and achy pain at the base of her head.  Her neurological exam is entirely normal and non-focal.  I have personally reviewed her MRI brain which does not show any abnormalities.  I reassured the patient that I do no find any  evidence of anything worrisome on her exam.  She may have cervicogenic headaches causing her head pressure, less likely occipital neuralgia.  She would like to try conservative therapies first.  Regarding her right leg paresthesias, with her normal exam etiology is uncertain.  Recommneded EMG of the right leg, but she would like to think about it.   PLAN/RECOMMENDATIONS:  1.  Start using heat or ice to the base of neck 2.  Start using tylenol or ibuprofen for neck discomfort 3.  Consider EMG of the right leg going forward 4.  Return to [facility] in 3 months  The duration of this appointment visit was 40 minutes of face-to-face time with the patient.  Greater than 50% of this time was spent in counseling, explanation of diagnosis, planning of further management, and coordination of care.   Thank you for allowing me to participate in patient's care.  If I can answer any additional questions, I would be pleased to do so.    Sincerely,    Adiba Fargnoli K. Posey Pronto, DO

## 2014-06-08 NOTE — Patient Instructions (Addendum)
1.  Start using heat or ice to the base of your head 2.  Start using tylenol or ibuprofen and see if this helps with your head pressure 3.  For your right foot numbness, please contact my office to reschedule EMG if there is no improvement 4.  Return to clinic in 3 months

## 2014-06-26 ENCOUNTER — Ambulatory Visit: Payer: BC Managed Care – PPO | Admitting: Neurology

## 2014-08-14 ENCOUNTER — Other Ambulatory Visit: Payer: Self-pay | Admitting: Internal Medicine

## 2014-08-27 ENCOUNTER — Telehealth: Payer: Self-pay | Admitting: Internal Medicine

## 2014-08-27 NOTE — Telephone Encounter (Signed)
Patient is requesting a [employer] referral to [facility] / Dr. Kathlen Mody for yearly eye exam.  This is scheduled for September 1st at 1:45pm.

## 2014-08-27 NOTE — Telephone Encounter (Signed)
Patient will bring card in to scan

## 2014-09-09 ENCOUNTER — Ambulatory Visit: Payer: BC Managed Care – PPO | Admitting: Neurology

## 2014-09-09 NOTE — Telephone Encounter (Signed)
Patients insurance does not require a referral.

## 2014-09-17 ENCOUNTER — Telehealth: Payer: Self-pay | Admitting: Internal Medicine

## 2014-09-17 DIAGNOSIS — Z0100 Encounter for examination of eyes and vision without abnormal findings: Secondary | ICD-10-CM

## 2014-09-17 NOTE — Telephone Encounter (Signed)
Referral entered  

## 2014-09-17 NOTE — Telephone Encounter (Signed)
Patient need a referral to [facility] to see Dr Kathlen Mody. Please advise

## 2014-11-09 ENCOUNTER — Ambulatory Visit: Payer: BLUE CROSS/BLUE SHIELD | Admitting: Internal Medicine

## 2014-12-10 ENCOUNTER — Encounter: Payer: BC Managed Care – PPO | Admitting: Internal Medicine

## 2014-12-10 ENCOUNTER — Ambulatory Visit (INDEPENDENT_AMBULATORY_CARE_PROVIDER_SITE_OTHER): Payer: Medicare PPO | Admitting: Internal Medicine

## 2014-12-10 ENCOUNTER — Other Ambulatory Visit: Payer: Self-pay | Admitting: Internal Medicine

## 2014-12-10 ENCOUNTER — Encounter: Payer: Self-pay | Admitting: Internal Medicine

## 2014-12-10 VITALS — BP 144/94 | HR 60 | Temp 97.7°F | Resp 16 | Ht 67.5 in | Wt 127.0 lb

## 2014-12-10 DIAGNOSIS — L500 Allergic urticaria: Secondary | ICD-10-CM | POA: Diagnosis not present

## 2014-12-10 DIAGNOSIS — R233 Spontaneous ecchymoses: Secondary | ICD-10-CM

## 2014-12-10 DIAGNOSIS — R238 Other skin changes: Secondary | ICD-10-CM

## 2014-12-10 DIAGNOSIS — Z0000 Encounter for general adult medical examination without abnormal findings: Secondary | ICD-10-CM | POA: Diagnosis not present

## 2014-12-10 DIAGNOSIS — M810 Age-related osteoporosis without current pathological fracture: Secondary | ICD-10-CM | POA: Insufficient documentation

## 2014-12-10 DIAGNOSIS — M8580 Other specified disorders of bone density and structure, unspecified site: Secondary | ICD-10-CM | POA: Diagnosis not present

## 2014-12-10 DIAGNOSIS — E039 Hypothyroidism, unspecified: Secondary | ICD-10-CM | POA: Diagnosis not present

## 2014-12-10 MED ORDER — SYNTHROID 50 MCG PO TABS: 50.0000 ug | ORAL_TABLET | Freq: Every day | ORAL | Status: DC

## 2014-12-10 NOTE — Patient Instructions (Addendum)
We have reviewed your prior records including labs and tests today.  Test(s) ordered today. Your results will be released to [place] (or called to you) after review, usually within 72hours after test completion. If any changes need to be made, you will be notified at that same time.  All other Health Maintenance issues reviewed.   All recommended immunizations and age-appropriate screenings are up-to-date.  No immunizations administered today.   A dexa scan was ordered.    A referral was ordered for an allergist.  Medications reviewed and updated.  We will try the synthroid brand medication instead of the generic.   Your prescription(s) have been submitted to your pharmacy. Please take as directed and contact our office if you believe you are having problem(s) with the medication(s).    Health Maintenance, Female Adopting a healthy lifestyle and getting preventive care can go a long way to promote health and wellness. Talk with your health care provider about what schedule of regular examinations is right for you. This is a good chance for you to check in with your provider about disease prevention and staying healthy. In between checkups, there are plenty of things you can do on your own. Experts have done a lot of research about which lifestyle changes and preventive measures are most likely to keep you healthy. Ask your health care provider for more information. WEIGHT AND DIET  Eat a healthy diet  Be sure to include plenty of vegetables, fruits, low-fat dairy products, and lean protein.  Do not eat a lot of foods high in solid fats, added sugars, or salt.  Get regular exercise. This is one of the most important things you can do for your health.  Most adults should exercise for at least 150 minutes each week. The exercise should increase your heart rate and make you sweat (moderate-intensity exercise).  Most adults should also do strengthening exercises at least twice a week.  This is in addition to the moderate-intensity exercise.  Maintain a healthy weight  Body mass index (BMI) is a measurement that can be used to identify possible weight problems. It estimates body fat based on height and weight. Your health care provider can help determine your BMI and help you achieve or maintain a healthy weight.  For females 10 years of age and older:   A BMI below 18.5 is considered underweight.  A BMI of 18.5 to 24.9 is normal.  A BMI of 25 to 29.9 is considered overweight.  A BMI of 30 and above is considered obese.  Watch levels of cholesterol and blood lipids  You should start having your blood tested for lipids and cholesterol at 65 years of age, then have this test every 5 years.  You may need to have your cholesterol levels checked more often if:  Your lipid or cholesterol levels are high.  You are older than 65 years of age.  You are at high risk for heart disease.  CANCER SCREENING   Lung Cancer  Lung cancer screening is recommended for adults 10-25 years old who are at high risk for lung cancer because of a history of smoking.  A yearly low-dose CT scan of the lungs is recommended for people who:  Currently smoke.  Have quit within the past 15 years.  Have at least a 30-pack-year history of smoking. A pack year is smoking an average of one pack of cigarettes a day for 1 year.  Yearly screening should continue until it has been 15 years  since you quit.  Yearly screening should stop if you develop a health problem that would prevent you from having lung cancer treatment.  Breast Cancer  Practice breast self-awareness. This means understanding how your breasts normally appear and feel.  It also means doing regular breast self-exams. Let your health care provider know about any changes, no matter how small.  If you are in your 20s or 30s, you should have a clinical breast exam (CBE) by a health care provider every 1-3 years as part of a  regular health exam.  If you are 75 or older, have a CBE every year. Also consider having a breast X-ray (mammogram) every year.  If you have a family history of breast cancer, talk to your health care provider about genetic screening.  If you are at high risk for breast cancer, talk to your health care provider about having an MRI and a mammogram every year.  Breast cancer gene (BRCA) assessment is recommended for women who have family members with BRCA-related cancers. BRCA-related cancers include:  Breast.  Ovarian.  Tubal.  Peritoneal cancers.  Results of the assessment will determine the need for genetic counseling and BRCA1 and BRCA2 testing. Cervical Cancer Your health care provider may recommend that you be screened regularly for cancer of the pelvic organs (ovaries, uterus, and vagina). This screening involves a pelvic examination, including checking for microscopic changes to the surface of your cervix (Pap test). You may be encouraged to have this screening done every 3 years, beginning at age 69.  For women ages 25-65, health care providers may recommend pelvic exams and Pap testing every 3 years, or they may recommend the Pap and pelvic exam, combined with testing for human papilloma virus (HPV), every 5 years. Some types of HPV increase your risk of cervical cancer. Testing for HPV may also be done on women of any age with unclear Pap test results.  Other health care providers may not recommend any screening for nonpregnant women who are considered low risk for pelvic cancer and who do not have symptoms. Ask your health care provider if a screening pelvic exam is right for you.  If you have had past treatment for cervical cancer or a condition that could lead to cancer, you need Pap tests and screening for cancer for at least 20 years after your treatment. If Pap tests have been discontinued, your risk factors (such as having a new sexual partner) need to be reassessed to  determine if screening should resume. Some women have medical problems that increase the chance of getting cervical cancer. In these cases, your health care provider may recommend more frequent screening and Pap tests. Colorectal Cancer  This type of cancer can be detected and often prevented.  Routine colorectal cancer screening usually begins at 65 years of age and continues through 65 years of age.  Your health care provider may recommend screening at an earlier age if you have risk factors for colon cancer.  Your health care provider may also recommend using home test kits to check for hidden blood in the stool.  A small camera at the end of a tube can be used to examine your colon directly (sigmoidoscopy or colonoscopy). This is done to check for the earliest forms of colorectal cancer.  Routine screening usually begins at age 25.  Direct examination of the colon should be repeated every 5-10 years through 65 years of age. However, you may need to be screened more often if early forms  of precancerous polyps or small growths are found. Skin Cancer  Check your skin from head to toe regularly.  Tell your health care provider about any new moles or changes in moles, especially if there is a change in a mole's shape or color.  Also tell your health care provider if you have a mole that is larger than the size of a pencil eraser.  Always use sunscreen. Apply sunscreen liberally and repeatedly throughout the day.  Protect yourself by wearing long sleeves, pants, a wide-brimmed hat, and sunglasses whenever you are outside. HEART DISEASE, DIABETES, AND HIGH BLOOD PRESSURE   High blood pressure causes heart disease and increases the risk of stroke. High blood pressure is more likely to develop in:  People who have blood pressure in the high end of the normal range (130-139/85-89 mm Hg).  People who are overweight or obese.  People who are African American.  If you are 18-39 years of  age, have your blood pressure checked every 3-5 years. If you are 27 years of age or older, have your blood pressure checked every year. You should have your blood pressure measured twice--once when you are at a hospital or clinic, and once when you are not at a hospital or clinic. Record the average of the two measurements. To check your blood pressure when you are not at a hospital or clinic, you can use:  An automated blood pressure machine at a pharmacy.  A home blood pressure monitor.  If you are between 76 years and 45 years old, ask your health care provider if you should take aspirin to prevent strokes.  Have regular diabetes screenings. This involves taking a blood sample to check your fasting blood sugar level.  If you are at a normal weight and have a low risk for diabetes, have this test once every three years after 65 years of age.  If you are overweight and have a high risk for diabetes, consider being tested at a younger age or more often. PREVENTING INFECTION  Hepatitis B  If you have a higher risk for hepatitis B, you should be screened for this virus. You are considered at high risk for hepatitis B if:  You were born in a country where hepatitis B is common. Ask your health care provider which countries are considered high risk.  Your parents were born in a high-risk country, and you have not been immunized against hepatitis B (hepatitis B vaccine).  You have HIV or AIDS.  You use needles to inject street drugs.  You live with someone who has hepatitis B.  You have had sex with someone who has hepatitis B.  You get hemodialysis treatment.  You take certain medicines for conditions, including cancer, organ transplantation, and autoimmune conditions. Hepatitis C  Blood testing is recommended for:  Everyone born from [birth year] through 1966.  Anyone with known risk factors for hepatitis C. Sexually transmitted infections (STIs)  You should be screened for sexually  transmitted infections (STIs) including gonorrhea and chlamydia if:  You are sexually active and are younger than 65 years of age.  You are older than 65 years of age and your health care provider tells you that you are at risk for this type of infection.  Your sexual activity has changed since you were last screened and you are at an increased risk for chlamydia or gonorrhea. Ask your health care provider if you are at risk.  If you do not have HIV, but are at  risk, it may be recommended that you take a prescription medicine daily to prevent HIV infection. This is called pre-exposure prophylaxis (PrEP). You are considered at risk if:  You are sexually active and do not regularly use condoms or know the HIV status of your partner(s).  You take drugs by injection.  You are sexually active with a partner who has HIV. Talk with your health care provider about whether you are at high risk of being infected with HIV. If you choose to begin PrEP, you should first be tested for HIV. You should then be tested every 3 months for as long as you are taking PrEP.  PREGNANCY   If you are premenopausal and you may become pregnant, ask your health care provider about preconception counseling.  If you may become pregnant, take 400 to 800 micrograms (mcg) of folic acid every day.  If you want to prevent pregnancy, talk to your health care provider about birth control (contraception). OSTEOPOROSIS AND MENOPAUSE   Osteoporosis is a disease in which the bones lose minerals and strength with aging. This can result in serious bone fractures. Your risk for osteoporosis can be identified using a bone density scan.  If you are 64 years of age or older, or if you are at risk for osteoporosis and fractures, ask your health care provider if you should be screened.  Ask your health care provider whether you should take a calcium or vitamin D supplement to lower your risk for osteoporosis.  Menopause may have  certain physical symptoms and risks.  Hormone replacement therapy may reduce some of these symptoms and risks. Talk to your health care provider about whether hormone replacement therapy is right for you.  HOME CARE INSTRUCTIONS   Schedule regular health, dental, and eye exams.  Stay current with your immunizations.   Do not use any tobacco products including cigarettes, chewing tobacco, or electronic cigarettes.  If you are pregnant, do not drink alcohol.  If you are breastfeeding, limit how much and how often you drink alcohol.  Limit alcohol intake to no more than 1 drink per day for nonpregnant women. One drink equals 12 ounces of beer, 5 ounces of wine, or 1 ounces of hard liquor.  Do not use street drugs.  Do not share needles.  Ask your health care provider for help if you need support or information about quitting drugs.  Tell your health care provider if you often feel depressed.  Tell your health care provider if you have ever been abused or do not feel safe at home.   This information is not intended to replace advice given to you by your health care provider. Make sure you discuss any questions you have with your health care provider.   Document Released: 07/25/2010 Document Revised: 01/30/2014 Document Reviewed: 12/11/2012 Elsevier Interactive Patient Education [employer].

## 2014-12-10 NOTE — Progress Notes (Signed)
Patient received education resource, including the self-management goal and tool. Patient verbalized understanding. 

## 2014-12-10 NOTE — Progress Notes (Signed)
Subjective:    Patient ID: Tina Lucas, female    DOB: January 16, 1951, 64 y.o.   MRN: [id number]  HPI She is here to establish with a new pcp.  She is here for a physical exam.  She is very active and feels thirty years younger than she is.   In 2013 one day she reacted to the sun/heat.  It started three months after going on thyroid medication - she is unsure if that is related.  She has always been out in the sun exercising and has never had any issues. She went walking for an hour and had redness, hives and severe reaction.  She saw dermatology.  She had been wearing sunscreen and a hat as always.  In 2013 she was tested for autoimmune diseases and everything was normal.  Her dermatologist thought she may have had skin lupus. She was put on prednisone and it helped.   She has continue to react since then whenever she is in the sun or very hot.  She had been playing tennis outside and now only plays pickelball inside - she is very active.     Water irritates her skin.  She has hives and breaks out frequently.  She has itching.  She is following with dermatology - [facility].  Sun and heat triggers something in her body.  She did try methotrexate and had hair loss and went off the medication.  She does not think it helped.  Sweating or Heat causes a slight rash and it Zyree Traynham on her face.  She has seen two different dermatologist and they do not have an answer for her.  She is controlled her symptoms with avoiding the sun/heat, but she still reacts with she gets hot when exercising.  She had a skin biopsy that was negative.  Autoimmune workup recently negative.    She has ecchymosis/bruied areas pop up throughout her body for the past several weeks for no reason.  She does not take any blood thinners, but does take several supplements.  She denies any trauma.      Medications and allergies reviewed with patient and updated if appropriate.  Patient Active Problem List   Diagnosis Date  Noted  . Osteopenia 12/10/2014  . Allergic urticaria 12/10/2014  . Pressure in head 04/01/2014  . Dizziness 02/10/2014  . Elevated blood pressure 11/06/2013  . Hypothyroid   . Primary HSV infection of mouth     Current Outpatient Prescriptions on File Prior to Visit  Medication Sig Dispense Refill  . Calcium Carbonate-Vitamin D (CALTRATE 600+D) 600-400 MG-UNIT per tablet Take 1 tablet by mouth daily.    . cholecalciferol (VITAMIN D) 400 UNITS TABS tablet Take 400 Units by mouth.    . famciclovir (FAMVIR) 500 MG tablet Take 500 mg by mouth 2 (two) times a week.    . Lactobacillus (ACIDOPHILUS/BIFIDUS PO) Take by mouth daily.    . Misc Natural Products (TART CHERRY ADVANCED PO) Take 1,000 mg by mouth.    . Multiple Vitamins-Minerals (ONE-A-DAY WOMENS 50+ ADVANTAGE PO) Take by mouth daily.     No current facility-administered medications on file prior to visit.    Past Medical History  Diagnosis Date  . Hypothyroid 2013  . History of asthma 1956-2000  . 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 Primary HSV infection of mouth 2008  . Allergic rhinitis     Past Surgical History  Procedure Laterality Date  . Tonsillectomy  1958    Social History   Social History  . Marital Status: Married    Spouse Name: N/A  . Number of Children: N/A  . Years of Education: N/A   Social History Main Topics  . Smoking status: Never Smoker   . Smokeless tobacco: Never Used  . Alcohol Use: 0.0 oz/week    0 Standard drinks or equivalent per week  . Drug Use: No  . Sexual Activity: Not Asked   Other Topics Concern  . None   Social History Narrative   Lives with husband in a one story home.  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    Review of Systems  Constitutional: Negative for fever and chills.  HENT: Negative for congestion, sinus pressure and sore throat.   Respiratory: Negative for cough, shortness of breath and  wheezing.   Cardiovascular: Negative for chest pain, palpitations and leg swelling.  Gastrointestinal: Negative for nausea, abdominal pain, diarrhea, constipation and blood in stool.       No GERD  Genitourinary: Negative for dysuria and hematuria.  Musculoskeletal: Positive for arthralgias (mild arthritis). Negative for back pain.  Skin: Positive for rash.  Neurological: Positive for dizziness (intermittent). Negative for headaches.       Posterior head pain at times from muscle tightness in neck - looking up too much from pickelball  Hematological: Bruises/bleeds easily.  Psychiatric/Behavioral: Negative for dysphoric mood. The patient is not nervous/anxious.        Objective:   Filed Vitals:   12/10/14 1105  BP: 144/94  Pulse: 60  Temp: 97.7 F (36.5 C)  Resp: 16   Filed Weights   12/10/14 1105  Weight: 127 lb (57.607 kg)   Body mass index is 19.59 kg/(m^2).   Physical Exam Constitutional: She appears well-developed and well-nourished. No distress.  HENT:  Head: Normocephalic and atraumatic.  Right Ear: External ear normal.  Left Ear: External ear normal.  Mouth/Throat: Oropharynx is clear and moist.  Normal bilateral ear canals and tympanic membranes  Eyes: Conjunctivae and EOM are normal.  Neck: Neck supple. No tracheal deviation present. No thyromegaly present.  No carotid bruit  Cardiovascular: Normal rate, regular rhythm and normal heart sounds.   No murmur heard. Pulmonary/Chest: Effort normal and breath sounds normal. No respiratory distress. She has no wheezes. She has no rales.  Abdominal: Soft. She exhibits no distension. There is no tenderness.  Musculoskeletal: She exhibits no edema.  Lymphadenopathy:    She has no cervical adenopathy.  Skin: Skin is warm and dry. She is not diaphoretic.  quarter sized ecchymosis on right hand without surrounding redness or swelling, nontender Psychiatric: She has a normal mood and affect. Her behavior is normal.          Assessment & Plan:   Physical exam: She had one full colonoscopy and would like to do the cologuard test She will schedule a mammogram dexa ordered Exercises regularly BMI good No concern for substance abuse Screening blood work ordered Vaccines probably all up to date  - need to get old records No ekg today - has very good exercise tolerance - will get one at her next PE  Allergic urticaria ? Cause Started after thyroid medication started - ? Relation or not -- will try different brands of the medication Following with dermatology - will refer to allergy for their input/testing She is controlling her symptoms at this point and avoids the sun/excessive heat when able,but is an avid exerciser  Osteopenia Exercising regularly Taking vitamin D and calcium dexa ordered She does not want to take any medication  Elevated blood pressure Elevated here, but she does monitor it and it has been controlled whenever she checks it Advised her to monitor more regularly for a short period of time to make sure it is controlled and then monitor on occasion to assure it remains controlled since it tends to be elevated at the doctor's office  Hypothyroid Check tfts Will try changing levothyroxine to synthroid to see if that affects her reaction to sun/heat Can also consider trying armour thyroid - unsure if thyroid is related at all  bp normal controlled at home and at pharmacy

## 2014-12-15 ENCOUNTER — Other Ambulatory Visit (INDEPENDENT_AMBULATORY_CARE_PROVIDER_SITE_OTHER): Payer: Medicare PPO

## 2014-12-15 ENCOUNTER — Telehealth: Payer: Self-pay | Admitting: Emergency Medicine

## 2014-12-15 DIAGNOSIS — R238 Other skin changes: Secondary | ICD-10-CM

## 2014-12-15 DIAGNOSIS — E039 Hypothyroidism, unspecified: Secondary | ICD-10-CM

## 2014-12-15 DIAGNOSIS — R233 Spontaneous ecchymoses: Secondary | ICD-10-CM

## 2014-12-15 DIAGNOSIS — Z5181 Encounter for therapeutic drug level monitoring: Secondary | ICD-10-CM

## 2014-12-15 DIAGNOSIS — Z0000 Encounter for general adult medical examination without abnormal findings: Secondary | ICD-10-CM

## 2014-12-15 DIAGNOSIS — M8580 Other specified disorders of bone density and structure, unspecified site: Secondary | ICD-10-CM

## 2014-12-15 LAB — LIPID PANEL
Cholesterol: 200 mg/dL (ref 0–200)
HDL: 82.6 mg/dL (ref >39.00)
LDL Cholesterol: 105 mg/dL — ABNORMAL HIGH (ref 0–99)
NonHDL: 117.48
Total CHOL/HDL Ratio: 2
Triglycerides: 63 mg/dL (ref 0.0–149.0)
VLDL: 12.6 mg/dL (ref 0.0–40.0)

## 2014-12-15 LAB — COMPREHENSIVE METABOLIC PANEL
ALT: 18 U/L (ref 0–35)
AST: 22 U/L (ref 0–37)
Albumin: 4.7 g/dL (ref 3.5–5.2)
Alkaline Phosphatase: 58 U/L (ref 39–117)
BUN: 12 mg/dL (ref 6–23)
CO2: 31 mEq/L (ref 19–32)
Calcium: 9.4 mg/dL (ref 8.4–10.5)
Chloride: 101 mEq/L (ref 96–112)
Creatinine, Ser: 0.67 mg/dL (ref 0.40–1.20)
GFR: 93.76 mL/min (ref >60.00)
Glucose, Bld: 84 mg/dL (ref 70–99)
Potassium: 3.7 mEq/L (ref 3.5–5.1)
Sodium: 140 mEq/L (ref 135–145)
Total Bilirubin: 1 mg/dL (ref 0.2–1.2)
Total Protein: 7.2 g/dL (ref 6.0–8.3)

## 2014-12-15 LAB — CBC WITH DIFFERENTIAL/PLATELET
Basophils Absolute: 0 10*3/uL (ref 0.0–0.1)
Basophils Relative: 0.4 % (ref 0.0–3.0)
Eosinophils Absolute: 0.6 10*3/uL (ref 0.0–0.7)
Eosinophils Relative: 10.9 % — ABNORMAL HIGH (ref 0.0–5.0)
HCT: 45.4 % (ref 36.0–46.0)
Hemoglobin: 15.3 g/dL — ABNORMAL HIGH (ref 12.0–15.0)
Lymphocytes Relative: 26.2 % (ref 12.0–46.0)
Lymphs Abs: 1.3 10*3/uL (ref 0.7–4.0)
MCHC: 33.7 g/dL (ref 30.0–36.0)
MCV: 93.5 fl (ref 78.0–100.0)
Monocytes Absolute: 0.5 10*3/uL (ref 0.1–1.0)
Monocytes Relative: 8.8 % (ref 3.0–12.0)
Neutro Abs: 2.8 10*3/uL (ref 1.4–7.7)
Neutrophils Relative %: 53.7 % (ref 43.0–77.0)
Platelets: 241 10*3/uL (ref 150.0–400.0)
RBC: 4.86 Mil/uL (ref 3.87–5.11)
RDW: 13.1 % (ref 11.5–15.5)
WBC: 5.1 10*3/uL (ref 4.0–10.5)

## 2014-12-15 LAB — PROTIME-INR
INR: 1 ratio (ref 0.8–1.0)
Prothrombin Time: 11.3 s (ref 9.6–13.1)

## 2014-12-15 LAB — URINALYSIS
Bilirubin Urine: NEGATIVE
Ketones, ur: NEGATIVE
Leukocytes, UA: NEGATIVE
Nitrite: NEGATIVE
Specific Gravity, Urine: 1.015 (ref 1.000–1.030)
Total Protein, Urine: NEGATIVE
Urine Glucose: NEGATIVE
Urobilinogen, UA: 0.2 (ref 0.0–1.0)
pH: 7.5 (ref 5.0–8.0)

## 2014-12-15 LAB — T3, FREE: T3, Free: 3.4 pg/mL (ref 2.3–4.2)

## 2014-12-15 LAB — TSH: TSH: 2.84 u[IU]/mL (ref 0.35–4.50)

## 2014-12-15 LAB — T4, FREE: Free T4: 0.91 ng/dL (ref 0.60–1.60)

## 2014-12-15 NOTE — Telephone Encounter (Signed)
Pt requested UA with labs.

## 2014-12-16 ENCOUNTER — Telehealth: Payer: Self-pay | Admitting: Emergency Medicine

## 2014-12-16 NOTE — Telephone Encounter (Signed)
Do you know if pt has ever had the Cologuard done? Is the dx for screening for malignancy.

## 2014-12-16 NOTE — Telephone Encounter (Signed)
Cologuard form faxed.

## 2014-12-16 NOTE — Telephone Encounter (Signed)
I do not think she has ever had it done.  It is for screening of colon cancer.

## 2014-12-16 NOTE — Telephone Encounter (Signed)
-----   Message from Binnie Rail, MD sent at 12/10/2014 11:56 AM EST ----- She wants cologuard - please send form in

## 2014-12-18 LAB — VITAMIN D 1,25 DIHYDROXY
Vitamin D 1, 25 (OH)2 Total: 39 pg/mL (ref 18–72)
Vitamin D2 1, 25 (OH)2: <8 pg/mL
Vitamin D3 1, 25 (OH)2: 39 pg/mL

## 2014-12-20 ENCOUNTER — Encounter: Payer: Self-pay | Admitting: Internal Medicine

## 2014-12-21 ENCOUNTER — Encounter: Payer: Self-pay | Admitting: Internal Medicine

## 2014-12-22 MED ORDER — LEVOTHYROXINE SODIUM 50 MCG PO TABS: 50.0000 ug | ORAL_TABLET | Freq: Every day | ORAL | Status: DC

## 2014-12-25 ENCOUNTER — Encounter: Payer: Self-pay | Admitting: Internal Medicine

## 2015-01-03 LAB — COLOGUARD: Cologuard: NEGATIVE

## 2015-01-25 ENCOUNTER — Telehealth: Payer: Self-pay | Admitting: Internal Medicine

## 2015-01-25 NOTE — Telephone Encounter (Signed)
Please let her know her cologuard test is negative

## 2015-01-28 NOTE — Telephone Encounter (Signed)
Spoke with pt to inform.  

## 2015-02-02 ENCOUNTER — Encounter: Payer: Self-pay | Admitting: Internal Medicine

## 2015-03-31 ENCOUNTER — Ambulatory Visit (INDEPENDENT_AMBULATORY_CARE_PROVIDER_SITE_OTHER): Payer: Medicare Other | Admitting: Podiatry

## 2015-03-31 ENCOUNTER — Encounter: Payer: Self-pay | Admitting: Podiatry

## 2015-03-31 VITALS — BP 161/80 | HR 74 | Resp 16

## 2015-03-31 DIAGNOSIS — B351 Tinea unguium: Secondary | ICD-10-CM | POA: Diagnosis not present

## 2015-03-31 DIAGNOSIS — M79673 Pain in unspecified foot: Secondary | ICD-10-CM | POA: Diagnosis not present

## 2015-03-31 NOTE — Progress Notes (Signed)
   Subjective:    Patient ID: Tina Lucas, female    DOB: 01-27-1950, 65 y.o.   MRN: [id number]  HPI Pt presents with bilateral thick discolored nails   Review of Systems  All other systems reviewed and are negative.      Objective:   Physical Exam        Assessment & Plan:

## 2015-04-01 NOTE — Progress Notes (Signed)
Subjective:     Patient ID: Nissa Campopiano, female   DOB: 19-Jan-1951, 65 y.o.   MRN: [id number]  HPI patient presents with long-term damage of her nailbeds with the hallux nails becoming increasingly incurvated and very difficult for her to cut. Wants to know what options are available   Review of Systems  All other systems reviewed and are negative.      Objective:   Physical Exam  Constitutional: She is oriented to person, place, and time.  Cardiovascular: Intact distal pulses.   Musculoskeletal: Normal range of motion.  Neurological: She is oriented to person, place, and time.  Skin: Skin is warm and dry.  Nursing note and vitals reviewed.  neurovascular status found to be intact muscle strength is adequate with thick nailbeds 1-5 both feet with thick big toenail beds being quite damaged in their appearance but this is been going on for at least 20 years good digital perfusion well oriented 3     Assessment:     Mycotic nail infections 1-5 both feet with trauma as a secondary cause    Plan:     H&P conditions reviewed and debridement accomplished. This will be done periodically as needed and I do not recommend any other treatments and less she really less them but I do not think they will be effective and I did explain that to her

## 2015-04-07 ENCOUNTER — Institutional Professional Consult (permissible substitution): Payer: Medicare PPO | Admitting: Internal Medicine

## 2015-06-22 LAB — HM MAMMOGRAPHY

## 2015-06-25 ENCOUNTER — Encounter: Payer: Self-pay | Admitting: Internal Medicine

## 2015-06-25 ENCOUNTER — Ambulatory Visit (INDEPENDENT_AMBULATORY_CARE_PROVIDER_SITE_OTHER): Payer: Medicare Other | Admitting: Internal Medicine

## 2015-06-25 VITALS — BP 146/84 | HR 57 | Temp 98.5°F | Resp 18 | Wt 127.0 lb

## 2015-06-25 DIAGNOSIS — R42 Dizziness and giddiness: Secondary | ICD-10-CM

## 2015-06-25 DIAGNOSIS — R233 Spontaneous ecchymoses: Secondary | ICD-10-CM

## 2015-06-25 DIAGNOSIS — R51 Headache: Secondary | ICD-10-CM

## 2015-06-25 DIAGNOSIS — R519 Headache, unspecified: Secondary | ICD-10-CM

## 2015-06-25 MED ORDER — CELECOXIB 100 MG PO CAPS: 100.0000 mg | ORAL_CAPSULE | Freq: Two times a day (BID) | ORAL | Status: DC

## 2015-06-25 NOTE — Assessment & Plan Note (Signed)
Related to cervical disc disease controlled with PT Would like to try celebrex - discussed possible side effects

## 2015-06-25 NOTE — Progress Notes (Signed)
Subjective:    Patient ID: Tina Lucas, female    DOB: 10-Oct-1950, 65 y.o.   MRN: [id number]  HPI She is here for follow up.  Right fingers 3-5 lock up/trigger finger:  She has this when she is using her hand - when she plays pickle ball.  She is having neck pain, posterior head pain and some arthritis pain.  She is interested in trying celebrex.    Dizziness, neck/posterior head pressure:  She has two types of vertigo - BPPV and cervical dizziness.  She going to PT and this makes a big difference.  She also has learned how to manage it. She needs a new referral for PT.  Purplish marks on hands:   She gets these purplish marks that look like bruises on her hands.  This has been constant since the fall.  They are never sore. She denies any similar lesions on the rest of her body.    Medications and allergies reviewed with patient and updated if appropriate.  Patient Active Problem List   Diagnosis Date Noted  . Osteopenia 12/10/2014  . Allergic urticaria 12/10/2014  . Pressure in head 04/01/2014  . Dizziness 02/10/2014  . Elevated blood pressure 11/06/2013  . Hypothyroid   . Primary HSV infection of mouth     Current Outpatient Prescriptions on File Prior to Visit  Medication Sig Dispense Refill  . Calcium Carbonate-Vitamin D (CALTRATE 600+D) 600-400 MG-UNIT per tablet Take 1 tablet by mouth daily.    . cholecalciferol (VITAMIN D) 400 UNITS TABS tablet Take 400 Units by mouth.    . fluocinonide cream (LIDEX) [id number] % Apply 1 application topically 2 (two) times daily as needed.     . Lactobacillus (ACIDOPHILUS/BIFIDUS PO) Take by mouth daily.    Marland Kitchen levothyroxine (SYNTHROID, LEVOTHROID) 50 MCG tablet Take 1 tablet (50 mcg total) by mouth daily before breakfast. 90 tablet 3  . Misc Natural Products (TART CHERRY ADVANCED PO) Take 1,000 mg by mouth.    . Multiple Vitamins-Minerals (ONE-A-DAY WOMENS 50+ ADVANTAGE PO) Take by mouth daily.     No current facility-administered medications  on file prior to visit.    Past Medical History  Diagnosis Date  . Hypothyroid 2013  . History of asthma 1956-2000  . 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 Primary HSV infection of mouth 2008  . Allergic rhinitis     Past Surgical History  Procedure Laterality Date  . Tonsillectomy  1958    Social History   Social History  . Marital Status: Married    Spouse Name: N/A  . Number of Children: N/A  . Years of Education: N/A   Social History Main Topics  . Smoking status: Never Smoker   . Smokeless tobacco: Never Used  . Alcohol Use: 0.0 oz/week    0 Standard drinks or equivalent per week  . Drug Use: No  . Sexual Activity: Not on file   Other Topics Concern  . Not on file   Social History Narrative   Lives with husband in a one story home.  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    Family History  Problem Relation Age of Onset  . Osteoarthritis Father   . Hypertension Father   . Breast cancer Mother 58  . Leukemia Maternal Aunt 45    Review of Systems  Musculoskeletal: Positive for arthralgias, neck pain and neck stiffness.  Skin:  Purplish eechymosis marks on b/l posterior hands  -nontender  Neurological: Positive for dizziness and headaches.       Objective:   Filed Vitals:   06/25/15 1337  BP: 146/84  Pulse: 57  Temp: 98.5 F (36.9 C)  Resp: 18   Filed Weights   06/25/15 1337  Weight: 127 lb (57.607 kg)   Body mass index is 19.59 kg/(m^2).   Physical Exam  Constitutional: She appears well-developed and well-nourished. No distress.  Musculoskeletal: She exhibits no edema.  Skin: She is not diaphoretic.  Ecchymotic areas - small on posterior hands, not tender, not raised          Assessment & Plan:   See Problem List for Assessment and Plan of chronic medical problems.

## 2015-06-25 NOTE — Patient Instructions (Signed)
   Medications reviewed and updated.  Changes include trying celebrex 100 mg twice a day as needed.  Your prescription(s) have been submitted to your pharmacy. Please take as directed and contact our office if you believe you are having problem(s) with the medication(s).  A referral was ordered for PT.

## 2015-06-25 NOTE — Assessment & Plan Note (Signed)
Located on hands No trauma ? Related to supplements - thinning skin Will just monitor for now Starting celebrex - discussed this may get worse

## 2015-06-25 NOTE — Assessment & Plan Note (Signed)
Controlled with PT Referral ordered

## 2015-06-25 NOTE — Progress Notes (Signed)
Pre visit review using our clinic review tool, if applicable. No additional management support is needed unless otherwise documented below in the visit note. 

## 2015-08-06 ENCOUNTER — Encounter: Payer: Self-pay | Admitting: Internal Medicine

## 2015-08-06 ENCOUNTER — Ambulatory Visit (INDEPENDENT_AMBULATORY_CARE_PROVIDER_SITE_OTHER)
Admission: RE | Admit: 2015-08-06 | Discharge: 2015-08-06 | Disposition: A | Discharge status: Home / Self Care | Payer: Medicare Other | Source: Ambulatory Visit | Attending: Internal Medicine | Admitting: Internal Medicine

## 2015-08-06 ENCOUNTER — Ambulatory Visit (INDEPENDENT_AMBULATORY_CARE_PROVIDER_SITE_OTHER): Payer: Medicare Other | Admitting: Internal Medicine

## 2015-08-06 VITALS — BP 174/76 | HR 75 | Temp 98.1°F | Resp 16 | Wt 126.0 lb

## 2015-08-06 DIAGNOSIS — IMO0001 Reserved for inherently not codable concepts without codable children: Secondary | ICD-10-CM

## 2015-08-06 DIAGNOSIS — R200 Anesthesia of skin: Secondary | ICD-10-CM | POA: Diagnosis not present

## 2015-08-06 DIAGNOSIS — R030 Elevated blood-pressure reading, without diagnosis of hypertension: Secondary | ICD-10-CM | POA: Diagnosis not present

## 2015-08-06 NOTE — Assessment & Plan Note (Signed)
BP well controlled at pharmacy - white coat hypertension She will continue to monitor her BP at the pharmacy

## 2015-08-06 NOTE — Progress Notes (Signed)
Subjective:    Patient ID: Tina Lucas, female    DOB: March 21, 1950, 65 y.o.   MRN: [id number]  HPI She is here for numbness on her right side.   She started taking one Celebrex 100 mg daily in June.  She thinks it was helping her cervical neck pain.  On July 9th she started to have right sided numbness/heaviness - foot, leg, hand, arm, but denies numbness in the face.  She does have some weakness in the leg/arm.   She denies slurred speech, confusion, weakness in arms/legs.  Last year she had numbness in the right foot and did see neurology - an EMG was considered, but she did not want that. She thinks it did get better at that time.   Her BP is well controlled at the pharmacy.  She does have white coat hypertension.   Medications and allergies reviewed with patient and updated if appropriate.  Patient Active Problem List   Diagnosis Date Noted  . Ecchymoses, spontaneous 06/25/2015  . Osteopenia 12/10/2014  . Allergic urticaria 12/10/2014  . Pressure in head 04/01/2014  . Dizziness 02/10/2014  . Elevated blood pressure 11/06/2013  . Hypothyroid   . Primary HSV infection of mouth     Current Outpatient Prescriptions on File Prior to Visit  Medication Sig Dispense Refill  . Calcium Carbonate-Vitamin D (CALTRATE 600+D) 600-400 MG-UNIT per tablet Take 1 tablet by mouth daily.    . celecoxib (CELEBREX) 100 MG capsule Take 1 capsule (100 mg total) by mouth 2 (two) times daily. (Patient taking differently: Take 100 mg by mouth daily. ) 60 capsule 5  . cholecalciferol (VITAMIN D) 400 UNITS TABS tablet Take 400 Units by mouth.    . fluocinonide cream (LIDEX) [id number] % Apply 1 application topically 2 (two) times daily as needed.     . Lactobacillus (ACIDOPHILUS/BIFIDUS PO) Take by mouth daily.    Marland Kitchen levothyroxine (SYNTHROID, LEVOTHROID) 50 MCG tablet Take 1 tablet (50 mcg total) by mouth daily before breakfast. 90 tablet 3  . Misc Natural Products (TART CHERRY ADVANCED PO) Take 1,000 mg by  mouth.    . Multiple Vitamins-Minerals (ONE-A-DAY WOMENS 50+ ADVANTAGE PO) Take by mouth daily.     No current facility-administered medications on file prior to visit.    Past Medical History  Diagnosis Date  . Hypothyroid 2013  . History of asthma 1956-2000  . 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 Primary HSV infection of mouth 2008  . Allergic rhinitis     Past Surgical History  Procedure Laterality Date  . Tonsillectomy  1958    Social History   Social History  . Marital Status: Married    Spouse Name: N/A  . Number of Children: N/A  . Years of Education: N/A   Social History Main Topics  . Smoking status: Never Smoker   . Smokeless tobacco: Never Used  . Alcohol Use: 0.0 oz/week    0 Standard drinks or equivalent per week  . Drug Use: No  . Sexual Activity: Not on file   Other Topics Concern  . Not on file   Social History Narrative   Lives with husband in a one story home.  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    Family History  Problem Relation Age of Onset  . Osteoarthritis Father   . Hypertension Father   . Breast cancer Mother 36  . Leukemia Maternal Aunt 68  Review of Systems  Constitutional: Negative for fever.  HENT: Negative for trouble swallowing.   Respiratory: Negative for shortness of breath.   Cardiovascular: Negative for chest pain and palpitations.  Neurological: Positive for tremors (right hand only - not new). Negative for speech difficulty.  Psychiatric/Behavioral: Negative for confusion.       Objective:   Filed Vitals:   08/06/15 0903  BP: 174/76  Pulse: 75  Temp: 98.1 F (36.7 C)  Resp: 16   Filed Weights   08/06/15 0903  Weight: 126 lb (57.153 kg)   Body mass index is 19.43 kg/(m^2).   Physical Exam  Constitutional: She is oriented to person, place, and time. She appears well-developed and well-nourished. No distress.  HENT:  Head:  Normocephalic and atraumatic.  Eyes: Conjunctivae are normal.  Cardiovascular: Normal rate, regular rhythm and normal heart sounds.   No murmur heard. Pulmonary/Chest: Effort normal and breath sounds normal. No respiratory distress. She has no wheezes. She has no rales.  Musculoskeletal: She exhibits no edema.  Neurological: She is alert and oriented to person, place, and time. No cranial nerve deficit.  Normal strength all extremities, normal gait Sensation different lateral aspect of right leg and more so in her right foot compared to left.  ? Normal sensation in right arm.  Normal sensation in face/neck  Skin: She is not diaphoretic.  Psychiatric: She has a normal mood and affect. Her behavior is normal.  Hyperactive, which is her baseline          Assessment & Plan:   See Problem List for Assessment and Plan of chronic medical problems.

## 2015-08-06 NOTE — Assessment & Plan Note (Signed)
Right arm/hand/leg/foot, associated with heaviness and subjective weakness (non on exam) Xray of c spine, will hold off on further neck imaging unless her symptom do not improve MRI of head done last year No evidence of a stroke on exam  Ok to continue celebrex - this is not a side effect She will let me know how her symptoms are next week

## 2015-08-06 NOTE — Progress Notes (Signed)
Pre visit review using our clinic review tool, if applicable. No additional management support is needed unless otherwise documented below in the visit note. 

## 2015-08-06 NOTE — Patient Instructions (Addendum)
Have an xray done.  If there is no improvement then let me know and we will consider getting an MRI of your neck.   Medications reviewed and updated.  No changes recommended at this time.

## 2015-08-07 ENCOUNTER — Encounter: Payer: Self-pay | Admitting: Internal Medicine

## 2015-08-19 ENCOUNTER — Other Ambulatory Visit: Payer: Self-pay | Admitting: Internal Medicine

## 2015-09-16 ENCOUNTER — Ambulatory Visit (INDEPENDENT_AMBULATORY_CARE_PROVIDER_SITE_OTHER): Payer: Medicare Other | Admitting: Podiatry

## 2015-09-16 DIAGNOSIS — M79676 Pain in unspecified toe(s): Secondary | ICD-10-CM | POA: Diagnosis not present

## 2015-09-16 DIAGNOSIS — B351 Tinea unguium: Secondary | ICD-10-CM

## 2015-09-16 NOTE — Progress Notes (Signed)
Subjective:     Patient ID: Tina Lucas, female   DOB: 03-31-1950, 65 y.o.   MRN: [id number]  HPI patient presents with elongated nailbeds 1-5 both feet with thick yellow brittle debris and pain   Review of Systems     Objective:   Physical Exam  Neurovascular status intact with patient noted to have yellow brittle nailbeds 1-5 both feet that are painful    Assessment:     Mycotic nail infections with pain 1-5 both feet    Plan:     Debris painful nailbeds 1-5 both feet with no iatrogenic bleeding noted

## 2016-01-06 ENCOUNTER — Encounter: Payer: Self-pay | Admitting: Podiatry

## 2016-01-06 ENCOUNTER — Ambulatory Visit (INDEPENDENT_AMBULATORY_CARE_PROVIDER_SITE_OTHER): Payer: Medicare Other | Admitting: Podiatry

## 2016-01-06 DIAGNOSIS — B351 Tinea unguium: Secondary | ICD-10-CM | POA: Diagnosis not present

## 2016-01-06 NOTE — Progress Notes (Signed)
Subjective:     Patient ID: Tina Lucas, female   DOB: 09-Nov-1950, 65 y.o.   MRN: [id number]  HPI patient is noted to have nail disease 1-5 both feet that are thick and incurvated and she cannot cut   Review of Systems     Objective:   Physical Exam Mycotic nail infection she's not able to cut    Assessment:     Reviewed mycotic nail infection    Plan:     Debridement mycotic nails with no iatrogenic bleeding noted

## 2016-01-13 ENCOUNTER — Other Ambulatory Visit: Payer: Medicare Other

## 2016-02-02 ENCOUNTER — Other Ambulatory Visit (INDEPENDENT_AMBULATORY_CARE_PROVIDER_SITE_OTHER): Payer: Medicare Other

## 2016-02-02 ENCOUNTER — Ambulatory Visit (INDEPENDENT_AMBULATORY_CARE_PROVIDER_SITE_OTHER): Payer: Medicare Other | Admitting: Internal Medicine

## 2016-02-02 ENCOUNTER — Encounter: Payer: Self-pay | Admitting: Internal Medicine

## 2016-02-02 VITALS — BP 150/82 | HR 52 | Temp 98.4°F | Resp 16 | Ht 68.0 in | Wt 127.0 lb

## 2016-02-02 DIAGNOSIS — Z0000 Encounter for general adult medical examination without abnormal findings: Secondary | ICD-10-CM

## 2016-02-02 DIAGNOSIS — M8580 Other specified disorders of bone density and structure, unspecified site: Secondary | ICD-10-CM

## 2016-02-02 DIAGNOSIS — L500 Allergic urticaria: Secondary | ICD-10-CM | POA: Diagnosis not present

## 2016-02-02 DIAGNOSIS — E039 Hypothyroidism, unspecified: Secondary | ICD-10-CM

## 2016-02-02 DIAGNOSIS — R030 Elevated blood-pressure reading, without diagnosis of hypertension: Secondary | ICD-10-CM

## 2016-02-02 LAB — CBC WITH DIFFERENTIAL/PLATELET
Basophils Absolute: 0.1 10*3/uL (ref 0.0–0.1)
Basophils Relative: 0.8 % (ref 0.0–3.0)
Eosinophils Absolute: 0.6 10*3/uL (ref 0.0–0.7)
Eosinophils Relative: 10.4 % — ABNORMAL HIGH (ref 0.0–5.0)
HCT: 43.6 % (ref 36.0–46.0)
Hemoglobin: 15.1 g/dL — ABNORMAL HIGH (ref 12.0–15.0)
Lymphocytes Relative: 23.1 % (ref 12.0–46.0)
Lymphs Abs: 1.4 10*3/uL (ref 0.7–4.0)
MCHC: 34.7 g/dL (ref 30.0–36.0)
MCV: 91.2 fl (ref 78.0–100.0)
Monocytes Absolute: 0.6 10*3/uL (ref 0.1–1.0)
Monocytes Relative: 9.4 % (ref 3.0–12.0)
Neutro Abs: 3.4 10*3/uL (ref 1.4–7.7)
Neutrophils Relative %: 56.3 % (ref 43.0–77.0)
Platelets: 231 10*3/uL (ref 150.0–400.0)
RBC: 4.78 Mil/uL (ref 3.87–5.11)
RDW: 12.7 % (ref 11.5–15.5)
WBC: 6.1 10*3/uL (ref 4.0–10.5)

## 2016-02-02 LAB — COMPREHENSIVE METABOLIC PANEL
ALT: 14 U/L (ref 0–35)
AST: 17 U/L (ref 0–37)
Albumin: 4.7 g/dL (ref 3.5–5.2)
Alkaline Phosphatase: 55 U/L (ref 39–117)
BUN: 13 mg/dL (ref 6–23)
CO2: 30 mEq/L (ref 19–32)
Calcium: 9.5 mg/dL (ref 8.4–10.5)
Chloride: 103 mEq/L (ref 96–112)
Creatinine, Ser: 0.69 mg/dL (ref 0.40–1.20)
GFR: 90.32 mL/min (ref >60.00)
Glucose, Bld: 101 mg/dL — ABNORMAL HIGH (ref 70–99)
Potassium: 3.9 mEq/L (ref 3.5–5.1)
Sodium: 143 mEq/L (ref 135–145)
Total Bilirubin: 0.9 mg/dL (ref 0.2–1.2)
Total Protein: 7.1 g/dL (ref 6.0–8.3)

## 2016-02-02 LAB — LIPID PANEL
Cholesterol: 212 mg/dL — ABNORMAL HIGH (ref 0–200)
HDL: 86.2 mg/dL (ref >39.00)
LDL Cholesterol: 114 mg/dL — ABNORMAL HIGH (ref 0–99)
NonHDL: 125.57
Total CHOL/HDL Ratio: 2
Triglycerides: 56 mg/dL (ref 0.0–149.0)
VLDL: 11.2 mg/dL (ref 0.0–40.0)

## 2016-02-02 LAB — VITAMIN D 25 HYDROXY (VIT D DEFICIENCY, FRACTURES): VITD: 51.02 ng/mL (ref 30.00–100.00)

## 2016-02-02 LAB — TSH: TSH: 2.8 u[IU]/mL (ref 0.35–4.50)

## 2016-02-02 MED ORDER — LEVOTHYROXINE SODIUM 50 MCG PO TABS
50.0000 ug | ORAL_TABLET | Freq: Every day | ORAL | 3 refills | Status: DC
Start: 2016-02-02 — End: 2017-01-31

## 2016-02-02 NOTE — Assessment & Plan Note (Signed)
She checks it regularly and is always well controlled 130/59-141/88 No treatment needed

## 2016-02-02 NOTE — Assessment & Plan Note (Signed)
Treated symptomatically by derm Sees derm annually

## 2016-02-02 NOTE — Assessment & Plan Note (Signed)
Check tsh  Titrate med dose if needed  

## 2016-02-02 NOTE — Progress Notes (Signed)
Subjective:    Patient ID: Tina Lucas, female    DOB: [birth year], 66 y.o.   MRN: [id number]  HPI Here for medicare wellness exam and an annual physical exam.   I have personally reviewed and have noted 1.The patient's medical and social history 2.Their use of alcohol, tobacco or illicit drugs 3.Their current medications and supplements 4.The patient's functional ability including ADL's, fall risks, home safety risks and hearing or visual impairment. 5.Diet and physical activities 6.Evidence for depression or mood disorders 7.Care team reviewed  - dermatology - Dr Amy Martinique, eye - Dr Herbert Deaner   Are there smokers in your home (other than you)? No  Risk Factors Exercise: regular - high intensity Dietary issues discussed: well balanced, lots of fruit and veges; does not eat red meat  Cardiac risk factors: advanced age  Depression Screen  Have you felt down, depressed or hopeless? No  Have you felt little interest or pleasure in doing things?  No  Activities of Daily Living In your present state of health, do you have any difficulty performing the following activities?:  Driving? No Managing money?  No Feeding yourself? No Getting from bed to chair? No Climbing a flight of stairs? No Preparing food and eating?: No Bathing or showering? No Getting dressed: No Getting to/using the toilet? No Moving around from place to place: No In the past year have you fallen or had a near fall?: No   Are you sexually active?  yes  Do you have more than one partner?  No   Hearing Difficulties: No Do you often ask people to speak up or repeat themselves? No Do you experience ringing or noises in your ears? No Do you have difficulty understanding soft or whispered voices? No Vision:              Any change in vision:  no             Up to date with eye exam:   Yes  Memory:  Do you feel that you have a problem with  memory? No  Do you often misplace items? No  Do you feel safe at home?  Yes  Cognitive Testing  Alert, Orientated? Yes  Normal Appearance? Yes  Recall of three objects?  Yes  Can perform simple calculations? Yes  Displays appropriate judgment? Yes  Can read the correct time from a watch face? Yes   Advanced Directives have been discussed with the patient? Yes  - in place   Medications and allergies reviewed with patient and updated if appropriate.  Patient Active Problem List   Diagnosis Date Noted  . Numbness 08/06/2015  . Ecchymoses, spontaneous 06/25/2015  . Osteopenia 12/10/2014  . Allergic urticaria 12/10/2014  . Pressure in head 04/01/2014  . Dizziness 02/10/2014  . Elevated blood pressure 11/06/2013  . Hypothyroid   . Primary HSV infection of mouth     Current Outpatient Prescriptions on File Prior to Visit  Medication Sig Dispense Refill  . Calcium Carbonate-Vitamin D (CALTRATE 600+D) 600-400 MG-UNIT per tablet Take 1 tablet by mouth daily.    . celecoxib (CELEBREX) 100 MG capsule Take 1 capsule (100 mg total) by mouth 2 (two) times daily. (Patient taking differently: Take 100 mg by mouth daily. ) 60 capsule 5  . cholecalciferol (VITAMIN D) 400 UNITS TABS tablet Take 400 Units by mouth.    . fluocinonide cream (LIDEX) [id number] % Apply 1 application topically 2 (two) times daily as needed.     Marland Kitchen  Lactobacillus (ACIDOPHILUS/BIFIDUS PO) Take by mouth daily.    Marland Kitchen levothyroxine (SYNTHROID, LEVOTHROID) 50 MCG tablet TAKE 1 TABLET (50 MCG TOTAL) BY MOUTH DAILY BEFORE BREAKFAST. 90 tablet 1  . Misc Natural Products (TART CHERRY ADVANCED PO) Take 1,000 mg by mouth.    . Multiple Vitamins-Minerals (ONE-A-DAY WOMENS 50+ ADVANTAGE PO) Take by mouth daily.     No current facility-administered medications on file prior to visit.     Past Medical History:  Diagnosis Date  . Allergic rhinitis   . History of asthma 1956-2000  . Hypothyroid 2013  . Primary HSV infection of mouth 2008   . 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 TONSILLECTOMY  1958    Social History   Social History  . Marital status: Married    Spouse name: N/A  . Number of children: N/A  . Years of education: N/A   Social History Main Topics  . Smoking status: Never Smoker  . Smokeless tobacco: Never Used  . Alcohol use 0.0 oz/week  . Drug use: No  . Sexual activity: Not on file   Other Topics Concern  . Not on file   Social History Narrative   Lives with husband in a one story home.  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    Family History  Problem Relation Age of Onset  . Osteoarthritis Father   . Hypertension Father   . Breast cancer Mother 70  . Leukemia Maternal Aunt 45    Review of Systems  Constitutional: Negative for chills and fever.  HENT: Negative for hearing loss and tinnitus.   Eyes: Negative for visual disturbance.  Respiratory: Negative for cough, shortness of breath and wheezing.   Cardiovascular: Negative for chest pain, palpitations and leg swelling.  Gastrointestinal: Negative for abdominal pain, blood in stool, constipation, diarrhea and nausea.       No gerd  Genitourinary: Negative for dysuria and hematuria.  Musculoskeletal: Positive for arthralgias, neck pain and neck stiffness.  Skin: Negative for rash.  Neurological: Positive for dizziness (intermittent). Negative for headaches.  Psychiatric/Behavioral: Negative for dysphoric mood. The patient is not nervous/anxious.        Objective:   Vitals:   02/02/16 0808  BP: (!) 150/82  Pulse: (!) 52  Resp: 16  Temp: 98.4 F (36.9 C)   Filed Weights   02/02/16 0808  Weight: 127 lb (57.6 kg)   Body mass index is 19.31 kg/m.  Wt Readings from Last 3 Encounters:  02/02/16 127 lb (57.6 kg)  08/06/15 126 lb (57.2 kg)  06/25/15 127 lb (57.6 kg)     Physical  Exam Constitutional: She appears well-developed and well-nourished. No distress.  HENT:  Head: Normocephalic and atraumatic.  Right Ear: External ear normal. Normal ear canal and TM Left Ear: External ear normal.  Normal ear canal and TM Mouth/Throat: Oropharynx is clear and moist.  Eyes: Conjunctivae and EOM are normal.  Neck: Neck supple. No tracheal deviation present. No thyromegaly present.  No carotid bruit  Cardiovascular: Normal rate, regular rhythm and normal heart sounds.   No murmur heard.  No edema. Pulmonary/Chest: Effort normal and breath sounds normal. No respiratory distress. She has no wheezes. She has no rales.  Breast: deferred Abdominal: Soft. She exhibits no distension. There is no tenderness.  Lymphadenopathy: She has no cervical adenopathy.  Skin: Skin is warm and dry. She is not  diaphoretic.  Psychiatric: She has a normal mood and affect. Her behavior is normal.         Assessment & Plan:   Wellness Exam: Immunizations - up to date Colonoscopy - had cologuard 12/2014 - repeat in 3 years Mammogram -  Up to date  Dexa - defers dexa, does not want to take medication Gyn - no longer sees Eye exam  Up to date  Hearing loss  none Memory concerns/difficulties  none Independent of ADLs fully Stressed the importance of regular exercise   Patient received copy of preventative screening tests/immunizations recommended for the next 5-10 years.  Physical exam: Screening blood work  ordered Immunizations  All up to date - has records Colonoscopy - had cologuard 12/2014 - repeat in 3 years Mammogram -  Up to date  37 - no longer sees Dexa - defers dexa, does not want to take medication Eye exams   Up to date  Exercise  - regular exercise Weight - normal BMI Skin - no concerns Substance abuse - none  See Problem List for Assessment and Plan of chronic medical problems.  FU annually

## 2016-02-02 NOTE — Assessment & Plan Note (Signed)
Deferred repeat dexa Exercises regularly Taking calcium and vitamin D

## 2016-02-02 NOTE — Patient Instructions (Signed)
Test(s) ordered today. Your results will be released to MyChart (or called to you) after review, usually within 72hours after test completion. If any changes need to be made, you will be notified at that same time.  All other Health Maintenance issues reviewed.   All recommended immunizations and age-appropriate screenings are up-to-date or discussed.  No immunizations administered today.   Medications reviewed and updated.  No changes recommended at this time.  Your prescription(s) have been submitted to your pharmacy. Please take as directed and contact our office if you believe you are having problem(s) with the medication(s).  Please followup in one year for a physical   Health Maintenance, Female Introduction Adopting a healthy lifestyle and getting preventive care can go a long way to promote health and wellness. Talk with your health care provider about what schedule of regular examinations is right for you. This is a good chance for you to check in with your provider about disease prevention and staying healthy. In between checkups, there are plenty of things you can do on your own. Experts have done a lot of research about which lifestyle changes and preventive measures are most likely to keep you healthy. Ask your health care provider for more information. Weight and diet Eat a healthy diet  Be sure to include plenty of vegetables, fruits, low-fat dairy products, and lean protein.  Do not eat a lot of foods high in solid fats, added sugars, or salt.  Get regular exercise. This is one of the most important things you can do for your health.  Most adults should exercise for at least 150 minutes each week. The exercise should increase your heart rate and make you sweat (moderate-intensity exercise).  Most adults should also do strengthening exercises at least twice a week. This is in addition to the moderate-intensity exercise. Maintain a healthy weight  Body mass index (BMI)  is a measurement that can be used to identify possible weight problems. It estimates body fat based on height and weight. Your health care provider can help determine your BMI and help you achieve or maintain a healthy weight.  For females 20 years of age and older:  A BMI below 18.5 is considered underweight.  A BMI of 18.5 to 24.9 is normal.  A BMI of 25 to 29.9 is considered overweight.  A BMI of 30 and above is considered obese. Watch levels of cholesterol and blood lipids  You should start having your blood tested for lipids and cholesterol at 67 years of age, then have this test every 5 years.  You may need to have your cholesterol levels checked more often if:  Your lipid or cholesterol levels are high.  You are older than 67 years of age.  You are at high risk for heart disease. Cancer screening Lung Cancer  Lung cancer screening is recommended for adults 55-80 years old who are at high risk for lung cancer because of a history of smoking.  A yearly low-dose CT scan of the lungs is recommended for people who:  Currently smoke.  Have quit within the past 15 years.  Have at least a 30-pack-year history of smoking. A pack year is smoking an average of one pack of cigarettes a day for 1 year.  Yearly screening should continue until it has been 15 years since you quit.  Yearly screening should stop if you develop a health problem that would prevent you from having lung cancer treatment. Breast Cancer  Practice breast self-awareness. This   means understanding how your breasts normally appear and feel.  It also means doing regular breast self-exams. Let your health care provider know about any changes, no matter how small.  If you are in your 20s or 30s, you should have a clinical breast exam (CBE) by a health care provider every 1-3 years as part of a regular health exam.  If you are 40 or older, have a CBE every year. Also consider having a breast X-ray (mammogram)  every year.  If you have a family history of breast cancer, talk to your health care provider about genetic screening.  If you are at high risk for breast cancer, talk to your health care provider about having an MRI and a mammogram every year.  Breast cancer gene (BRCA) assessment is recommended for women who have family members with BRCA-related cancers. BRCA-related cancers include:  Breast.  Ovarian.  Tubal.  Peritoneal cancers.  Results of the assessment will determine the need for genetic counseling and BRCA1 and BRCA2 testing. Cervical Cancer  Your health care provider may recommend that you be screened regularly for cancer of the pelvic organs (ovaries, uterus, and vagina). This screening involves a pelvic examination, including checking for microscopic changes to the surface of your cervix (Pap test). You may be encouraged to have this screening done every 3 years, beginning at age 21.  For women ages 30-65, health care providers may recommend pelvic exams and Pap testing every 3 years, or they may recommend the Pap and pelvic exam, combined with testing for human papilloma virus (HPV), every 5 years. Some types of HPV increase your risk of cervical cancer. Testing for HPV may also be done on women of any age with unclear Pap test results.  Other health care providers may not recommend any screening for nonpregnant women who are considered low risk for pelvic cancer and who do not have symptoms. Ask your health care provider if a screening pelvic exam is right for you.  If you have had past treatment for cervical cancer or a condition that could lead to cancer, you need Pap tests and screening for cancer for at least 20 years after your treatment. If Pap tests have been discontinued, your risk factors (such as having a new sexual partner) need to be reassessed to determine if screening should resume. Some women have medical problems that increase the chance of getting cervical  cancer. In these cases, your health care provider may recommend more frequent screening and Pap tests. Colorectal Cancer  This type of cancer can be detected and often prevented.  Routine colorectal cancer screening usually begins at 67 years of age and continues through 67 years of age.  Your health care provider may recommend screening at an earlier age if you have risk factors for colon cancer.  Your health care provider may also recommend using home test kits to check for hidden blood in the stool.  A small camera at the end of a tube can be used to examine your colon directly (sigmoidoscopy or colonoscopy). This is done to check for the earliest forms of colorectal cancer.  Routine screening usually begins at age 50.  Direct examination of the colon should be repeated every 5-10 years through 67 years of age. However, you may need to be screened more often if early forms of precancerous polyps or small growths are found. Skin Cancer  Check your skin from head to toe regularly.  Tell your health care provider about any new moles or   changes in moles, especially if there is a change in a mole's shape or color.  Also tell your health care provider if you have a mole that is larger than the size of a pencil eraser.  Always use sunscreen. Apply sunscreen liberally and repeatedly throughout the day.  Protect yourself by wearing long sleeves, pants, a wide-brimmed hat, and sunglasses whenever you are outside. Heart disease, diabetes, and high blood pressure  High blood pressure causes heart disease and increases the risk of stroke. High blood pressure is more likely to develop in:  People who have blood pressure in the high end of the normal range (130-139/85-89 mm Hg).  People who are overweight or obese.  People who are African American.  If you are 18-39 years of age, have your blood pressure checked every 3-5 years. If you are 40 years of age or older, have your blood pressure  checked every year. You should have your blood pressure measured twice-once when you are at a hospital or clinic, and once when you are not at a hospital or clinic. Record the average of the two measurements. To check your blood pressure when you are not at a hospital or clinic, you can use:  An automated blood pressure machine at a pharmacy.  A home blood pressure monitor.  If you are between 55 years and 79 years old, ask your health care provider if you should take aspirin to prevent strokes.  Have regular diabetes screenings. This involves taking a blood sample to check your fasting blood sugar level.  If you are at a normal weight and have a low risk for diabetes, have this test once every three years after 67 years of age.  If you are overweight and have a high risk for diabetes, consider being tested at a younger age or more often. Preventing infection Hepatitis B  If you have a higher risk for hepatitis B, you should be screened for this virus. You are considered at high risk for hepatitis B if:  You were born in a country where hepatitis B is common. Ask your health care provider which countries are considered high risk.  Your parents were born in a high-risk country, and you have not been immunized against hepatitis B (hepatitis B vaccine).  You have HIV or AIDS.  You use needles to inject street drugs.  You live with someone who has hepatitis B.  You have had sex with someone who has hepatitis B.  You get hemodialysis treatment.  You take certain medicines for conditions, including cancer, organ transplantation, and autoimmune conditions. Hepatitis C  Blood testing is recommended for:  Everyone born from 1945 through 1965.  Anyone with known risk factors for hepatitis C. Sexually transmitted infections (STIs)  You should be screened for sexually transmitted infections (STIs) including gonorrhea and chlamydia if:  You are sexually active and are younger than 67  years of age.  You are older than 67 years of age and your health care provider tells you that you are at risk for this type of infection.  Your sexual activity has changed since you were last screened and you are at an increased risk for chlamydia or gonorrhea. Ask your health care provider if you are at risk.  If you do not have HIV, but are at risk, it may be recommended that you take a prescription medicine daily to prevent HIV infection. This is called pre-exposure prophylaxis (PrEP). You are considered at risk if:  You are sexually   active and do not regularly use condoms or know the HIV status of your partner(s).  You take drugs by injection.  You are sexually active with a partner who has HIV. Talk with your health care provider about whether you are at high risk of being infected with HIV. If you choose to begin PrEP, you should first be tested for HIV. You should then be tested every 3 months for as long as you are taking PrEP. Pregnancy  If you are premenopausal and you may become pregnant, ask your health care provider about preconception counseling.  If you may become pregnant, take 400 to 800 micrograms (mcg) of folic acid every day.  If you want to prevent pregnancy, talk to your health care provider about birth control (contraception). Osteoporosis and menopause  Osteoporosis is a disease in which the bones lose minerals and strength with aging. This can result in serious bone fractures. Your risk for osteoporosis can be identified using a bone density scan.  If you are 35 years of age or older, or if you are at risk for osteoporosis and fractures, ask your health care provider if you should be screened.  Ask your health care provider whether you should take a calcium or vitamin D supplement to lower your risk for osteoporosis.  Menopause may have certain physical symptoms and risks.  Hormone replacement therapy may reduce some of these symptoms and risks. Talk to your  health care provider about whether hormone replacement therapy is right for you. Follow these instructions at home:  Schedule regular health, dental, and eye exams.  Stay current with your immunizations.  Do not use any tobacco products including cigarettes, chewing tobacco, or electronic cigarettes.  If you are pregnant, do not drink alcohol.  If you are breastfeeding, limit how much and how often you drink alcohol.  Limit alcohol intake to no more than 1 drink per day for nonpregnant women. One drink equals 12 ounces of beer, 5 ounces of wine, or 1 ounces of hard liquor.  Do not use street drugs.  Do not share needles.  Ask your health care provider for help if you need support or information about quitting drugs.  Tell your health care provider if you often feel depressed.  Tell your health care provider if you have ever been abused or do not feel safe at home. This information is not intended to replace advice given to you by your health care provider. Make sure you discuss any questions you have with your health care provider. Document Released: 07/25/2010 Document Revised: 06/17/2015 Document Reviewed: 10/13/2014  2017 [employer]

## 2016-02-03 ENCOUNTER — Encounter: Payer: Self-pay | Admitting: Internal Medicine

## 2016-04-20 ENCOUNTER — Encounter (HOSPITAL_COMMUNITY): Payer: Self-pay

## 2016-04-20 ENCOUNTER — Observation Stay (HOSPITAL_COMMUNITY)
Admission: EM | Admit: 2016-04-20 | Discharge: 2016-04-21 | Disposition: A | Discharge status: Home / Self Care | Payer: Medicare Other | Attending: Internal Medicine | Admitting: Internal Medicine

## 2016-04-20 ENCOUNTER — Emergency Department (HOSPITAL_COMMUNITY): Payer: Medicare Other

## 2016-04-20 ENCOUNTER — Encounter: Payer: Self-pay | Admitting: Internal Medicine

## 2016-04-20 ENCOUNTER — Ambulatory Visit (INDEPENDENT_AMBULATORY_CARE_PROVIDER_SITE_OTHER): Payer: Medicare Other | Admitting: Internal Medicine

## 2016-04-20 VITALS — BP 176/96 | HR 67 | Temp 98.1°F | Resp 16 | Wt 128.0 lb

## 2016-04-20 DIAGNOSIS — K219 Gastro-esophageal reflux disease without esophagitis: Secondary | ICD-10-CM | POA: Insufficient documentation

## 2016-04-20 DIAGNOSIS — E039 Hypothyroidism, unspecified: Secondary | ICD-10-CM | POA: Diagnosis present

## 2016-04-20 DIAGNOSIS — R079 Chest pain, unspecified: Secondary | ICD-10-CM

## 2016-04-20 DIAGNOSIS — I4510 Unspecified right bundle-branch block: Secondary | ICD-10-CM | POA: Diagnosis not present

## 2016-04-20 DIAGNOSIS — I444 Left anterior fascicular block: Secondary | ICD-10-CM | POA: Insufficient documentation

## 2016-04-20 DIAGNOSIS — Z791 Long term (current) use of non-steroidal anti-inflammatories (NSAID): Secondary | ICD-10-CM | POA: Insufficient documentation

## 2016-04-20 DIAGNOSIS — I10 Essential (primary) hypertension: Secondary | ICD-10-CM | POA: Diagnosis present

## 2016-04-20 DIAGNOSIS — R0789 Other chest pain: Secondary | ICD-10-CM | POA: Diagnosis not present

## 2016-04-20 DIAGNOSIS — I2510 Atherosclerotic heart disease of native coronary artery without angina pectoris: Secondary | ICD-10-CM | POA: Diagnosis not present

## 2016-04-20 DIAGNOSIS — Z79899 Other long term (current) drug therapy: Secondary | ICD-10-CM | POA: Diagnosis not present

## 2016-04-20 LAB — BASIC METABOLIC PANEL
Anion gap: 12 (ref 5–15)
BUN: 9 mg/dL (ref 6–20)
CO2: 25 mmol/L (ref 22–32)
Calcium: 9.6 mg/dL (ref 8.9–10.3)
Chloride: 99 mmol/L — ABNORMAL LOW (ref 101–111)
Creatinine, Ser: 0.7 mg/dL (ref 0.44–1.00)
GFR calc Af Amer: >60 mL/min (ref >60)
GFR calc non Af Amer: >60 mL/min (ref >60)
Glucose, Bld: 113 mg/dL — ABNORMAL HIGH (ref 65–99)
Potassium: 4 mmol/L (ref 3.5–5.1)
Sodium: 136 mmol/L (ref 135–145)

## 2016-04-20 LAB — CBC
HCT: 44.4 % (ref 36.0–46.0)
Hemoglobin: 15.6 g/dL — ABNORMAL HIGH (ref 12.0–15.0)
MCH: 31.5 pg (ref 26.0–34.0)
MCHC: 35.1 g/dL (ref 30.0–36.0)
MCV: 89.5 fL (ref 78.0–100.0)
Platelets: 257 10*3/uL (ref 150–400)
RBC: 4.96 MIL/uL (ref 3.87–5.11)
RDW: 12.4 % (ref 11.5–15.5)
WBC: 6.7 10*3/uL (ref 4.0–10.5)

## 2016-04-20 LAB — I-STAT TROPONIN, ED
Troponin i, poc: 0 ng/mL (ref 0.00–0.08)
Troponin i, poc: 0.02 ng/mL (ref 0.00–0.08)

## 2016-04-20 MED ORDER — VITAMIN D 1000 UNITS PO TABS: 1000.0000 [IU] | ORAL_TABLET | Freq: Every day | ORAL | Status: DC

## 2016-04-20 MED ORDER — ACETAMINOPHEN 325 MG PO TABS
650.0000 mg | ORAL_TABLET | ORAL | Status: DC | PRN
  Administered 2016-04-21: 650 mg via ORAL
  Filled 2016-04-20: qty 2

## 2016-04-20 MED ORDER — FAMOTIDINE 20 MG PO TABS: 20.0000 mg | ORAL_TABLET | Freq: Two times a day (BID) | ORAL | Status: DC

## 2016-04-20 MED ORDER — NITROGLYCERIN 0.4 MG SL SUBL: 0.4000 mg | SUBLINGUAL_TABLET | SUBLINGUAL | Status: DC | PRN

## 2016-04-20 MED ORDER — ASPIRIN 81 MG PO CHEW
324.0000 mg | CHEWABLE_TABLET | Freq: Once | ORAL | Status: AC
  Administered 2016-04-20: 324 mg via ORAL
  Filled 2016-04-20: qty 4

## 2016-04-20 MED ORDER — MAGNESIUM OXIDE 400 (241.3 MG) MG PO TABS: 200.0000 mg | ORAL_TABLET | Freq: Every day | ORAL | Status: DC

## 2016-04-20 MED ORDER — GI COCKTAIL ~~LOC~~
30.0000 mL | Freq: Once | ORAL | Status: AC
  Administered 2016-04-20: 30 mL via ORAL
  Filled 2016-04-20: qty 30

## 2016-04-20 MED ORDER — FLUOCINONIDE 0.05 % EX CREA: 1.0000 "application " | TOPICAL_CREAM | Freq: Two times a day (BID) | CUTANEOUS | Status: DC | PRN

## 2016-04-20 MED ORDER — MAGNESIUM OXIDE 400 MG PO TABS: 200.0000 mg | ORAL_TABLET | Freq: Every day | ORAL | Status: DC

## 2016-04-20 MED ORDER — LABETALOL HCL 5 MG/ML IV SOLN
5.0000 mg | INTRAVENOUS | Status: DC | PRN
Start: 2016-04-20 — End: 2016-04-21

## 2016-04-20 MED ORDER — ASPIRIN EC 81 MG PO TBEC: 81.0000 mg | DELAYED_RELEASE_TABLET | Freq: Every day | ORAL | Status: DC

## 2016-04-20 MED ORDER — ADULT MULTIVITAMIN W/MINERALS CH: 1.0000 | ORAL_TABLET | Freq: Every day | ORAL | Status: DC

## 2016-04-20 MED ORDER — CALCIUM CARBONATE-VITAMIN D 500-200 MG-UNIT PO TABS: 1.0000 | ORAL_TABLET | Freq: Every day | ORAL | Status: DC

## 2016-04-20 MED ORDER — ENOXAPARIN SODIUM 40 MG/0.4ML ~~LOC~~ SOLN: 40.0000 mg | Freq: Every day | SUBCUTANEOUS | Status: DC

## 2016-04-20 MED ORDER — MORPHINE SULFATE (PF) 4 MG/ML IV SOLN: 1.0000 mg | INTRAVENOUS | Status: DC | PRN

## 2016-04-20 MED ORDER — RISAQUAD PO CAPS: 1.0000 | ORAL_CAPSULE | Freq: Every day | ORAL | Status: DC

## 2016-04-20 MED ORDER — ONDANSETRON HCL 4 MG/2ML IJ SOLN: 4.0000 mg | Freq: Four times a day (QID) | INTRAMUSCULAR | Status: DC | PRN

## 2016-04-20 MED ORDER — LEVOTHYROXINE SODIUM 50 MCG PO TABS
50.0000 ug | ORAL_TABLET | Freq: Every day | ORAL | Status: DC
  Administered 2016-04-21: 50 ug via ORAL
  Filled 2016-04-20 (×2): qty 1

## 2016-04-20 NOTE — Assessment & Plan Note (Signed)
Atypical for GERD or cardiac ischemia EKG today is abnormal - no prior for comparison - Sinus  Rhythm, LAFB, LVH, Poor wave progression, possible prior anterior MI Given active chest - abnormal EKG will have her go to the ED - vs alternative being definitive testing next week She agrees

## 2016-04-20 NOTE — ED Triage Notes (Signed)
Per Pt, Pt is coming from home with complaints of indigestion and burping x four days. Pt went to PCP to be evaluated and was sent over due to abnormal EKG. Denies SOB, N/V/D.

## 2016-04-20 NOTE — Progress Notes (Signed)
Subjective:    Patient ID: Tina Lucas, female    DOB: 12-27-1950, 66 y.o.   MRN: [id number]  HPI She is here for an acute visit.   She feels something is different. She has been having GERD for three weeks.  She eats a bland diet and there have been on changes.  She drinks one 5 oz glass of wine daily.  She burps a lot, which seemed to go hand in hand with GERD.  She takes one celebrex daily and this is not new.  She denies otc pain medications.  Her symptoms were intermittent when they first started but are now more constant.   On Monday her chest felt heavy.  She was not sob.  She felt a presence in her chest.  It lasted all day.  She has been exercising daily and it did not make it worse.   Her BP at home is always well controlled - she has white coat htn.  She denies palpitations, edema, headaches, lightheadedness.  She has not had any cold symptoms.    Medications and allergies reviewed with patient and updated if appropriate.  Patient Active Problem List   Diagnosis Date Noted  . Numbness 08/06/2015  . Ecchymoses, spontaneous 06/25/2015  . Osteopenia 12/10/2014  . Allergic urticaria 12/10/2014  . Pressure in head 04/01/2014  . Dizziness 02/10/2014  . Elevated blood pressure reading 11/06/2013  . Hypothyroid   . Primary HSV infection of mouth     Current Outpatient Prescriptions on File Prior to Visit  Medication Sig Dispense Refill  . Calcium Carbonate-Vitamin D (CALTRATE 600+D) 600-400 MG-UNIT per tablet Take 1 tablet by mouth daily.    . celecoxib (CELEBREX) 100 MG capsule Take 1 capsule (100 mg total) by mouth 2 (two) times daily. (Patient taking differently: Take 100 mg by mouth daily. ) 60 capsule 5  . cholecalciferol (VITAMIN D) 400 UNITS TABS tablet Take 400 Units by mouth.    . fluocinonide cream (LIDEX) 7.01 % Apply 1 application topically 2 (two) times daily as needed.     . Lactobacillus (ACIDOPHILUS/BIFIDUS PO) Take by mouth daily.    Marland Kitchen levothyroxine  (SYNTHROID, LEVOTHROID) 50 MCG tablet Take 1 tablet (50 mcg total) by mouth daily before breakfast. 90 tablet 3  . Misc Natural Products (TART CHERRY ADVANCED PO) Take 1,000 mg by mouth.    . Multiple Vitamins-Minerals (ONE-A-DAY WOMENS 50+ ADVANTAGE PO) Take by mouth daily.     No current facility-administered medications on file prior to visit.     Past Medical History:  Diagnosis Date  . Allergic rhinitis   . History of asthma 1956-2000  . Hypothyroid 2013  . Primary HSV infection of mouth 2008  . 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 TONSILLECTOMY  1958    Social History   Social History  . Marital status: Married    Spouse name: N/A  . Number of children: N/A  . Years of education: N/A   Social History Main Topics  . Smoking status: Never Smoker  . Smokeless tobacco: Never Used  . Alcohol use 0.0 oz/week  . Drug use: No  . Sexual activity: Not Asked   Other Topics Concern  . None   Social History Narrative   Lives with husband in a one story home.  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    Family History  Problem  Relation Age of Onset  . Osteoarthritis Father   . Hypertension Father   . Breast cancer Mother 55  . Leukemia Maternal Aunt 45    Review of Systems  Constitutional: Negative for chills and fever.  Respiratory: Negative for cough, shortness of breath and wheezing.   Cardiovascular: Positive for chest pain. Negative for palpitations.  Neurological: Positive for dizziness (chronic, intermittent). Negative for light-headedness and headaches.       Objective:   Vitals:   04/20/16 1045  BP: (!) 176/96  Pulse: 67  Resp: 16  Temp: 98.1 F (36.7 C)   Filed Weights   04/20/16 1045  Weight: 128 lb (58.1 kg)   Body mass index is 19.46 kg/m.  Wt Readings from Last 3 Encounters:  04/20/16 128 lb (58.1 kg)  02/02/16 127  lb (57.6 kg)  08/06/15 126 lb (57.2 kg)     Physical Exam Constitutional: Appears well-developed and well-nourished. No distress.  HENT:  Head: Normocephalic and atraumatic.  Neck: Neck supple. No tracheal deviation present. No thyromegaly present.  No cervical lymphadenopathy Cardiovascular: Normal rate, regular rhythm and normal heart sounds.   No murmur heard. No carotid bruit .  No edema Pulmonary/Chest: chest non tender to palpation, Effort normal and breath sounds normal. No respiratory distress. No has no wheezes. No rales.  Skin: Skin is warm and dry. Not diaphoretic.  Psychiatric: Normal mood and affect. Behavior is normal.         Assessment & Plan:   See Problem List for Assessment and Plan of chronic medical problems.

## 2016-04-20 NOTE — H&P (Signed)
History and Physical    Tina Lucas BVQ:[id number] DOB: 04-25-1950 DOA: 04/20/2016  PCP: Binnie Rail, MD   Patient coming from: Home  Chief Complaint: Chest discomfort   HPI: Tina Lucas is a 66 y.o. female with medical history significant for hypothyroidism and hypertension who presents the emergency department at the direction of her PCP for evaluation of chest discomfort with abnormal EKG. Patient reports that she had been in her usual state of health until approximately 3 weeks ago when she noted the development of intermittent belching and indigestion. She had been taking over-the-counter treatments for this with inconsistent results. There has been no associated nausea or diaphoresis. She does not have a known history of coronary artery disease. She has consistently elevated blood pressures in the clinic, but this has been attributed to "white coat hypertension," and she is not treated for it. She reports playing competitive tennis, practicing or playing in tournaments near-daily with no change in her symptoms until the last couple days. Over the past couple days, she notes becoming fatigued more easily than usual, still able to play tennis for a couple hours before resting. Today, she developed a subtle discomfort in the central chest along with the indigestion symptoms and saw her PCP for evaluation of this. EKG was obtained with some abnormalities reported and no prior available to compare. Patient was directed to the emergency department for further evaluation.   ED Course: Upon arrival to the ED, patient is found to be afebrile, saturating well on room air, hypertensive to 165/92, and with vitals otherwise stable. EKG features a normal sinus rhythm with incomplete right bundle branch block and LAFB. Chest x-ray is negative for acute cardiopulmonary disease, chemistry panel was unremarkable, and CBC is also unremarkable. Troponin is within the normal limits 2 in the emergency department. UDS  is negative and UA is notable for moderate hemoglobin and protein. Patient was treated with 324 mg aspirin in the emergency department. She remained hemodynamically stable and free of any anginal complaints. Cardiology was consulted by the ED physician and advised a medical admission. The patient will be observed on the telemetry unit for ongoing evaluation and management of chest discomfort with EKG abnormality.  Review of Systems:  All other systems reviewed and apart from HPI, are negative.  Past Medical History:  Diagnosis Date  . Allergic rhinitis   . History of asthma 1956-2000  . Hypothyroid 2013  . Primary HSV infection of mouth 2008  . 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 TONSILLECTOMY  1958     reports that she has never smoked. She has never used smokeless tobacco. She reports that she drinks alcohol. She reports that she does not use drugs.  No Known Allergies  Family History  Problem Relation Age of Onset  . Breast cancer Mother 16  . Osteoarthritis Father   . Hypertension Father   . Leukemia Maternal Aunt 45     Prior to Admission medications   Medication Sig Start Date End Date Taking? Authorizing Provider  bismuth subsalicylate (PEPTO BISMOL) 262 MG/15ML suspension Take 5 mLs by mouth every 6 (six) hours as needed.   Yes Historical Provider, MD  Calcium Carbonate-Vitamin D (CALTRATE 600+D) 600-400 MG-UNIT per tablet Take 1 tablet by mouth daily.   Yes Historical Provider, MD  celecoxib (CELEBREX) 100 MG capsule Take 100 mg by mouth daily.   Yes Historical Provider, MD  cholecalciferol (  VITAMIN D) 400 UNITS TABS tablet Take 400 Units by mouth.   Yes Historical Provider, MD  fexofenadine (ALLEGRA) 180 MG tablet Take 180 mg by mouth daily.   Yes Historical Provider, MD  fluocinonide cream (LIDEX) 3.29 % Apply 1 application topically 2 (two) times daily as  needed (FOR PSORIASIS).    Yes Historical Provider, MD  levothyroxine (SYNTHROID, LEVOTHROID) 50 MCG tablet Take 1 tablet (50 mcg total) by mouth daily before breakfast. 02/02/16  Yes Binnie Rail, MD  magnesium oxide (MAG-OX) 400 MG tablet Take 200 mg by mouth daily.   Yes Historical Provider, MD  Multiple Vitamins-Minerals (ONE-A-DAY WOMENS 50+ ADVANTAGE PO) Take by mouth daily.   Yes Historical Provider, MD  Probiotic Product (PROBIOTIC DAILY) CAPS Take 1 capsule by mouth daily.   Yes Historical Provider, MD    Physical Exam: Vitals:   04/20/16 1745 04/20/16 1815 04/20/16 1845 04/20/16 1915  BP: (!) 149/74 (!) 154/87 (!) 147/73 (!) 153/82  Pulse: 74 65 (!) 58 73  Resp: (!) 23 14 13 18   Temp:      TempSrc:      SpO2: 100% 100% 96% 99%  Weight:      Height:          Constitutional: NAD, calm, comfortable Eyes: PERTLA, lids and conjunctivae normal ENMT: Mucous membranes are moist. Posterior pharynx clear of any exudate or lesions.   Neck: normal, supple, no masses, no thyromegaly Respiratory: clear to auscultation bilaterally, no wheezing, no crackles. Normal respiratory effort.   Cardiovascular: S1 & S2 heard, regular rate and rhythm. No extremity edema. No significant JVD. Abdomen: No distension, no tenderness, no masses palpated. Bowel sounds normal.  Musculoskeletal: no clubbing / cyanosis. No joint deformity upper and lower extremities.    Skin: no significant rashes, lesions, ulcers. Warm, dry, well-perfused. Neurologic: CN 2-12 grossly intact. Sensation intact, DTR normal. Strength 5/5 in all 4 limbs.  Psychiatric:  Alert and oriented x 3. Normal mood and affect.     Labs on Admission: I have personally reviewed following labs and imaging studies  CBC:  Recent Labs Lab 04/20/16 1309  WBC 6.7  HGB 15.6*  HCT 44.4  MCV 89.5  PLT 518   Basic Metabolic Panel:  Recent Labs Lab 04/20/16 1309  NA 136  K 4.0  CL 99*  CO2 25  GLUCOSE 113*  BUN 9  CREATININE  0.70  CALCIUM 9.6   GFR: Estimated Creatinine Clearance: 63.4 mL/min (by C-G formula based on SCr of 0.7 mg/dL). Liver Function Tests: No results for input(s): AST, ALT, ALKPHOS, BILITOT, PROT, ALBUMIN in the last 168 hours. No results for input(s): LIPASE, AMYLASE in the last 168 hours. No results for input(s): AMMONIA in the last 168 hours. Coagulation Profile: No results for input(s): INR, PROTIME in the last 168 hours. Cardiac Enzymes: No results for input(s): CKTOTAL, CKMB, CKMBINDEX, TROPONINI in the last 168 hours. BNP (last 3 results) No results for input(s): PROBNP in the last 8760 hours. HbA1C: No results for input(s): HGBA1C in the last 72 hours. CBG: No results for input(s): GLUCAP in the last 168 hours. Lipid Profile: No results for input(s): CHOL, HDL, LDLCALC, TRIG, CHOLHDL, LDLDIRECT in the last 72 hours. Thyroid Function Tests: No results for input(s): TSH, T4TOTAL, FREET4, T3FREE, THYROIDAB in the last 72 hours. Anemia Panel: No results for input(s): VITAMINB12, FOLATE, FERRITIN, TIBC, IRON, RETICCTPCT in the last 72 hours. Urine analysis:    Component Value Date/Time   COLORURINE YELLOW 12/15/2014 1255  APPEARANCEUR CLEAR 12/15/2014 1255   LABSPEC 1.015 12/15/2014 1255   PHURINE 7.5 12/15/2014 1255   GLUCOSEU NEGATIVE 12/15/2014 1255   HGBUR TRACE-LYSED (A) 12/15/2014 1255   BILIRUBINUR NEGATIVE 12/15/2014 1255   KETONESUR NEGATIVE 12/15/2014 1255   UROBILINOGEN 0.2 12/15/2014 1255   NITRITE NEGATIVE 12/15/2014 1255   LEUKOCYTESUR NEGATIVE 12/15/2014 1255   Sepsis Labs: @LABRCNTIP (procalcitonin:4,lacticidven:4) )No results found for this or any previous visit (from the past 240 hour(s)).   Radiological Exams on Admission: Dg Chest 2 View  Result Date: 04/20/2016 CLINICAL DATA:  Intermittent chest pain, worse today. EXAM: CHEST  2 VIEW COMPARISON:  None. FINDINGS: The cardiomediastinal silhouette is within normal limits. The lungs are hyperinflated  without evidence of airspace consolidation, edema, pleural effusion, or pneumothorax. There is moderate thoracolumbar scoliosis. IMPRESSION: 1. No evidence of acute cardiopulmonary disease. 2. Hyperinflation suggesting COPD. Electronically Signed   By: Logan Bores M.D.   On: 04/20/2016 13:35    EKG: Independently reviewed. Sinus rhythm, incomplete RBBB, LAFB.   Assessment/Plan  1. Chest discomfort  - Presents at direction of PCP for 3 wks of intermittent belching, now associated with a vague central chest discomfort and EKG abnormalities without prior available  - There is no anginal complaint on admission  - Initial EKG with SR, incomplete RBBB, and LAFB - Troponin is negative x2 in ED more than 6 hrs apart  - Cardiology was unable to scheduled close outpatient eval and advised medical admission  - Plan to monitor on telemetry for ischemic changes, treat HTN with prn beta-blocker, follow-up cardiology recommendations  - She refuses any additional lab draws   2. Hypertension  - Consistently elevated in outpatient setting and elevated today  - Treating with prn labetalol IVP's for now  - Consider starting antihypertensive    3. Hypothyroidism  - Continue Synthroid   4. GERD - No EGD report on file - Managed with prn Pepto Bismol at home - Pepcid BID for now with prn GI cocktail    DVT prophylaxis: sq Lovenox  Code Status: Full  Family Communication: Husband updated at bedside Disposition Plan: Observe on telemetry Consults called: Cardiology Admission status: Observation    Vianne Bulls, MD [facility] Pager [phone]  If 7PM-7AM, please contact night-coverage [email or web address] Password [facility]  04/20/2016, 7:52 PM

## 2016-04-20 NOTE — Patient Instructions (Signed)
Go to the ED for further evaluation of your chest pain.

## 2016-04-20 NOTE — ED Notes (Signed)
Admitting at bedside 

## 2016-04-20 NOTE — Progress Notes (Signed)
Pre visit review using our clinic review tool, if applicable. No additional management support is needed unless otherwise documented below in the visit note. 

## 2016-04-20 NOTE — ED Provider Notes (Signed)
[facility] DEPT Provider Note   CSN: [id number] Arrival date & time: 04/20/16  1240     History   Chief Complaint Chief Complaint  Patient presents with  . Chest Pain    HPI Tina Lucas is a 66 y.o. female with PMHx of asthma, hypothyroid Presents today for complaints of central pressure in chest x 3 weeks. She reports her chest pressure was at first intermittent and now constant, unchanged, 6/10. She reports associated indigestion, burping, heartburn. She states nothing makes it better or worse. She states she has tried peptobismol with no relief. She denies SOB, nausea, vomiting, diarrhea, numbness, tingling. She denies any association with food or exercise, or activity.  She states she is very active and exercises often. She states she hasn't had anything to eat since breakfast because she decided to come to the ED. Pt referred here by PCP to be evaluated due to abnormal EKG. She denies any new medications. She denies taking any BP medications and states she always gets "white coat syndrome". She denies family hx of cardiac disease. She denies smoking.   The history is provided by the patient. No language interpreter was used.  Chest Pain   Pertinent negatives include no abdominal pain, no cough, no fever, no nausea, no palpitations, no shortness of breath and no vomiting.    Past Medical History:  Diagnosis Date  . Allergic rhinitis   . History of asthma 1956-2000  . Hypothyroid 2013  . Primary HSV infection of mouth 2008  . Sunlight-induced angio-edema-urticaria 07/11/2011   unknown trigger, neg autoimmune workup    Patient Active Problem List   Diagnosis Date Noted  . Chest pain 04/20/2016  . Essential hypertension 04/20/2016  . GERD (gastroesophageal reflux disease) 04/20/2016  . Numbness 08/06/2015  . Ecchymoses, spontaneous 06/25/2015  . Osteopenia 12/10/2014  . Allergic urticaria 12/10/2014  . Pressure in head 04/01/2014  . Dizziness 02/10/2014  . Elevated  blood pressure reading 11/06/2013  . Hypothyroid   . Primary HSV infection of mouth     Past Surgical History:  Procedure Laterality Date  . TONSILLECTOMY  1958    OB History    No data available       Home Medications    Prior to Admission medications   Medication Sig Start Date End Date Taking? Authorizing Provider  bismuth subsalicylate (PEPTO BISMOL) 262 MG/15ML suspension Take 5 mLs by mouth every 6 (six) hours as needed.   Yes Historical Provider, MD  Calcium Carbonate-Vitamin D (CALTRATE 600+D) 600-400 MG-UNIT per tablet Take 1 tablet by mouth daily.   Yes Historical Provider, MD  celecoxib (CELEBREX) 100 MG capsule Take 100 mg by mouth daily.   Yes Historical Provider, MD  cholecalciferol (VITAMIN D) 400 UNITS TABS tablet Take 400 Units by mouth.   Yes Historical Provider, MD  fexofenadine (ALLEGRA) 180 MG tablet Take 180 mg by mouth daily.   Yes Historical Provider, MD  fluocinonide cream (LIDEX) 7.74 % Apply 1 application topically 2 (two) times daily as needed (FOR PSORIASIS).    Yes Historical Provider, MD  levothyroxine (SYNTHROID, LEVOTHROID) 50 MCG tablet Take 1 tablet (50 mcg total) by mouth daily before breakfast. 02/02/16  Yes Binnie Rail, MD  magnesium oxide (MAG-OX) 400 MG tablet Take 200 mg by mouth daily.   Yes Historical Provider, MD  Multiple Vitamins-Minerals (ONE-A-DAY WOMENS 50+ ADVANTAGE PO) Take by mouth daily.   Yes Historical Provider, MD  Probiotic Product (PROBIOTIC DAILY) CAPS Take 1 capsule by mouth daily.  Yes Historical Provider, MD    Family History Family History  Problem Relation Age of Onset  . Breast cancer Mother 12  . Osteoarthritis Father   . Hypertension Father   . Leukemia Maternal Aunt 66    Social History Social History  Substance Use Topics  . Smoking status: Never Smoker  . Smokeless tobacco: Never Used  . Alcohol use 0.0 oz/week     Allergies   Patient has no known allergies.   Review of Systems Review of  Systems  Constitutional: Negative for chills and fever.  Respiratory: Negative for cough and shortness of breath.   Cardiovascular: Positive for chest pain. Negative for palpitations.  Gastrointestinal: Negative for abdominal pain, diarrhea, nausea and vomiting.  Genitourinary: Negative for difficulty urinating and dysuria.  All other systems reviewed and are negative.    Physical Exam Updated Vital Signs BP (!) 153/82   Pulse 73   Temp 97.6 F (36.4 C) (Oral)   Resp 18   Ht 5' 7.5" (1.715 m)   Wt 58.1 kg   SpO2 99%   BMI 19.75 kg/m   Physical Exam  Constitutional: She appears well-developed and well-nourished.  Well appearing. Non-toxic.   HENT:  Head: Normocephalic and atraumatic.  Nose: Nose normal.  Mouth/Throat: Oropharynx is clear and moist.  Eyes: Conjunctivae and EOM are normal. Pupils are equal, round, and reactive to light.  Neck: Normal range of motion.  Cardiovascular: Normal rate, normal heart sounds and intact distal pulses.   No murmur heard. Pulmonary/Chest: Effort normal and breath sounds normal. No respiratory distress. She has no wheezes.  Normal work of breathing. No respiratory distress noted.   Abdominal: Soft. There is no tenderness. There is no rebound and no guarding.  Musculoskeletal: Normal range of motion.  Chest pain is not reproducible.   Neurological: She is alert.  Skin: Skin is warm. Capillary refill takes less than 2 seconds.  Psychiatric: She has a normal mood and affect. Her behavior is normal.  Nursing note and vitals reviewed.    ED Treatments / Results  Labs (all labs ordered are listed, but only abnormal results are displayed) Labs Reviewed  BASIC METABOLIC PANEL - Abnormal; Notable for the following:       Result Value   Chloride 99 (*)    Glucose, Bld 113 (*)    All other components within normal limits  CBC - Abnormal; Notable for the following:    Hemoglobin 15.6 (*)    All other components within normal limits    I-STAT TROPOININ, ED  I-STAT TROPOININ, ED    EKG  EKG Interpretation  Date/Time:  Thursday April 20 2016 12:52:58 EDT Ventricular Rate:  81 PR Interval:  142 QRS Duration: 98 QT Interval:  392 QTC Calculation: 455 R Axis:   -49 Text Interpretation:  Normal sinus rhythm Right atrial enlargement Pulmonary disease pattern Incomplete right bundle branch block Left anterior fascicular block Septal infarct , age undetermined Abnormal ECG Confirmed by RAY MD, Andee Poles [phone]) on 04/20/2016 5:13:33 PM       Radiology Dg Chest 2 View  Result Date: 04/20/2016 CLINICAL DATA:  Intermittent chest pain, worse today. EXAM: CHEST  2 VIEW COMPARISON:  None. FINDINGS: The cardiomediastinal silhouette is within normal limits. The lungs are hyperinflated without evidence of airspace consolidation, edema, pleural effusion, or pneumothorax. There is moderate thoracolumbar scoliosis. IMPRESSION: 1. No evidence of acute cardiopulmonary disease. 2. Hyperinflation suggesting COPD. Electronically Signed   By: Logan Bores M.D.   On:  04/20/2016 13:35    Procedures Procedures (including critical care time)  Medications Ordered in ED Medications  PROBIOTIC DAILY CAPS 1 capsule (not administered)  levothyroxine (SYNTHROID, LEVOTHROID) tablet 50 mcg (not administered)  fluocinonide cream (LIDEX) 3.61 % 1 application (not administered)  Calcium Carbonate-Vitamin D 600-400 MG-UNIT 1 tablet (not administered)  cholecalciferol (VITAMIN D) tablet 400 Units (not administered)  ONE-A-DAY WOMENS 50+ ADVANTAGE TABS 1 capsule (not administered)  aspirin EC tablet 81 mg (not administered)  nitroGLYCERIN (NITROSTAT) SL tablet 0.4 mg (not administered)  acetaminophen (TYLENOL) tablet 650 mg (not administered)  ondansetron (ZOFRAN) injection 4 mg (not administered)  enoxaparin (LOVENOX) injection 40 mg (not administered)  labetalol (NORMODYNE,TRANDATE) injection 5 mg (not administered)  magnesium oxide (MAG-OX) tablet  200 mg (not administered)  gi cocktail (Maalox,Lidocaine,Donnatal) (30 mLs Oral Given 04/20/16 1722)  aspirin chewable tablet 324 mg (324 mg Oral Given 04/20/16 2032)     Initial Impression / Assessment and Plan / ED Course  I have reviewed the triage vital signs and the nursing notes.  Pertinent labs & imaging results that were available during my care of the patient were reviewed by me and considered in my medical decision making (see chart for details).    Pt is a 66 yo with chest pain. She is in NAD, hemodynamically stable, afebrile. No ischemic changes on EKG and pain unchanged with aspirin and GI cocktail here in ED.  Pain not reproducible. Considered ACS, low risk for PE, doubt dissection, doubt esophageal perf.  No evidence of pericarditis.    Labs, xray and visualized by me.  Neg troponin x 2 and low risk for PE. Wells criteria 0. Heart score 3.   Patient also seen and evaluated by Dr. Jeanell Sparrow who agrees with assessment and plan.   18:35 I spoke with Dr. Ellyn Hack regarding having patient seen outpatient tomorrow. He agreed and wanted me to speak with PA Grandville Silos regarding scheduling. 18:50 I spoke with PA Grandville Silos who states she cannot get patient in tomorrow and encouraged hospitalist admit.  19:14 I spoke with Dr. Myna Hidalgo who agreed to have patient admitted for ACS rule out.    Final Clinical Impressions(s) / ED Diagnoses   Final diagnoses:  Chest pain, unspecified type    New Prescriptions New Prescriptions   No medications on file     [place], [place] 04/20/16 2105    Pattricia Boss, MD 04/21/16 [phone]

## 2016-04-21 ENCOUNTER — Observation Stay (HOSPITAL_COMMUNITY): Payer: Medicare Other

## 2016-04-21 ENCOUNTER — Encounter (HOSPITAL_COMMUNITY): Payer: Self-pay | Admitting: Cardiology

## 2016-04-21 DIAGNOSIS — R072 Precordial pain: Secondary | ICD-10-CM

## 2016-04-21 DIAGNOSIS — R079 Chest pain, unspecified: Secondary | ICD-10-CM | POA: Diagnosis not present

## 2016-04-21 LAB — MRSA PCR SCREENING: MRSA by PCR: NEGATIVE

## 2016-04-21 MED ORDER — NITROGLYCERIN 0.4 MG SL SUBL
SUBLINGUAL_TABLET | SUBLINGUAL | Status: AC
  Administered 2016-04-21: 17:00:00
  Filled 2016-04-21: qty 2

## 2016-04-21 MED ORDER — METOPROLOL TARTRATE 50 MG PO TABS
50.0000 mg | ORAL_TABLET | Freq: Once | ORAL | Status: AC
  Administered 2016-04-21: 50 mg via ORAL
  Filled 2016-04-21: qty 1

## 2016-04-21 MED ORDER — IOPAMIDOL (ISOVUE-370) INJECTION 76%
INTRAVENOUS | Status: AC
  Administered 2016-04-21: 80 mL
  Filled 2016-04-21: qty 100

## 2016-04-21 NOTE — Discharge Summary (Signed)
Physician Discharge Summary  Tina Lucas BMW:[id number] DOB: 1951-01-03 DOA: 04/20/2016  PCP: Binnie Rail, MD  Admit date: 04/20/2016 Discharge date: 04/21/2016  Recommendations for Outpatient Follow-up:  1. Pt will need to follow up with PCP in 2-3 weeks post discharge  Discharge Diagnoses:  Principal Problem:   Chest pain Active Problems:   Hypothyroid   Essential hypertension   GERD (gastroesophageal reflux disease)  Discharge Condition: Stable  Diet recommendation: Heart healthy diet discussed in details   History of present illness:  66 y.o. female with medical history significant for hypothyroidism and hypertension who presented to the emergency department at the direction of her PCP for evaluation of chest discomfort with abnormal EKG. Patient reports that she had been in her usual state of health until approximately 3 weeks ago when she noted the development of intermittent belching and indigestion. Cardiology was consulted by the ED physician and advised a medical admission.   Hospital Course:   1. Chest discomfort  - Presented at direction of PCP for 3 wks of intermittent belching - resolved at this point  - Initial EKG with SR, incomplete RBBB, and LAFB - Troponin is negative x2  - Cardiology recommended coronary CTA to evaluate for obstructive CAD  1. Coronary calcium score of 46. This was 50 percentile for age and sex matched control. 2. Normal coronary origin with right dominance. 3. Mild non-obstructive CAD. Ann aggressive risk factor modification is recommended. 4. Moderate concentric LVH. 5. Mildly dilated pulmonary artery measuring 30 x 25 mm.  - cardiology cleared for discharge   2. Hypertension  - outpt follow up  3. Hypothyroidism  - Continue Synthroid   4. GERD - No EGD report on file - Managed with prn Pepto Bismol at home  Procedures/Studies: Dg Chest 2 View  Result Date: 04/20/2016 CLINICAL DATA:  Intermittent chest pain, worse  today. EXAM: CHEST  2 VIEW COMPARISON:  None. FINDINGS: The cardiomediastinal silhouette is within normal limits. The lungs are hyperinflated without evidence of airspace consolidation, edema, pleural effusion, or pneumothorax. There is moderate thoracolumbar scoliosis. IMPRESSION: 1. No evidence of acute cardiopulmonary disease. 2. Hyperinflation suggesting COPD. Electronically Signed   By: Logan Bores M.D.   On: 04/20/2016 13:35   Ct Coronary Morph W/cta Cor W/score W/ca W/cm &/or Wo/cm  Result Date: 04/21/2016 CLINICAL DATA:  34 -year-old female with atypical chest pain. EXAM: Cardiac/Coronary  CT TECHNIQUE: The patient was scanned on a Philips 256 scanner. FINDINGS: A 120 kV prospective scan was triggered in the descending thoracic aorta at 111 HU's. Axial non-contrast 3 mm slices were carried out through the heart. The data set was analyzed on a dedicated work station and scored using the Pioneer. Gantry rotation speed was 270 msecs and collimation was .9 mm. 5 mg of iv Metoprolol and and 0.8 mg of sl NTG was given. The 3D data set was reconstructed in 5% intervals of the 67-82 % of the R-R cycle. Diastolic phases were analyzed on a dedicated work station using MPR, MIP and VRT modes. The patient received 80 cc of contrast. Aorta:  Normal size.  Minimal calcifications.  No dissection. Aortic Valve:  Trileaflet.  No calcifications. Coronary Arteries:  Normal coronary origin.  Right dominance. RCA is a very large dominant artery that gives rise to an a very small acute marginal branch, 3 PDAs and PLVB. There is minimal calcified plaque in proximal to mid RCA associated with 0-25% stenosis. Left main is a large artery that gives rise  to LAD, ramus intermedius and LCX arteries. There is minimal calcified plaque at very distal LM with associated stenosis 0-25%. LAD is a medium size vessel that gives rise to one small diagonal branch. There is no plaque. RI is a very small artery that has no plaque. LCX  is a small non-dominant artery has no plaque. Other findings: Normal pulmonary vein drainage into the left atrium. Normal let atrial appendage without a thrombus. Moderate concentric LVH. Mildly dilated pulmonary artery measuring 30 x 25 mm. IMPRESSION: 1. Coronary calcium score of 46. This was 24 percentile for age and sex matched control. 2. Normal coronary origin with right dominance. 3. Mild non-obstructive CAD. Ann aggressive risk factor modification is recommended. 4. Moderate concentric LVH. 5. Mildly dilated pulmonary artery measuring 30 x 25 mm. Ena Dawley Electronically Signed   By: Ena Dawley   On: 04/21/2016 17:39     Discharge Exam: Vitals:   04/21/16 1220 04/21/16 1700  BP: 137/83 131/83  Pulse: 69 66  Resp: 18 (!) 21  Temp: 97.5 F (36.4 C) 98.2 F (36.8 C)   Vitals:   04/21/16 0800 04/21/16 0834 04/21/16 1220 04/21/16 1700  BP: (!) 150/90 (!) 150/90 137/83 131/83  Pulse: 68 81 69 66  Resp: 14 (!) 24 18 (!) 21  Temp:  97.9 F (36.6 C) 97.5 F (36.4 C) 98.2 F (36.8 C)  TempSrc:  Oral Oral Oral  SpO2: 100% 100% 98% 96%  Weight:      Height:        General: Pt is alert, follows commands appropriately, not in acute distress Cardiovascular: Regular rate and rhythm, S1/S2 +, no murmurs, no rubs, no gallops Respiratory: Clear to auscultation bilaterally, no wheezing, no crackles, no rhonchi Abdominal: Soft, non tender, non distended, bowel sounds +, no guarding Extremities: no edema, no cyanosis, pulses palpable bilaterally DP and PT Neuro: Grossly nonfocal  Discharge Instructions  Discharge Instructions    Diet - low sodium heart healthy    Complete by:  As directed    Increase activity slowly    Complete by:  As directed      Allergies as of 04/21/2016   No Known Allergies     Medication List    TAKE these medications   bismuth subsalicylate 947 ML/46TK suspension Commonly known as:  PEPTO BISMOL Take 5 mLs by mouth every 6 (six) hours as  needed.   CALTRATE 600+D 600-400 MG-UNIT tablet Generic drug:  Calcium Carbonate-Vitamin D Take 1 tablet by mouth daily.   celecoxib 100 MG capsule Commonly known as:  CELEBREX Take 100 mg by mouth daily.   cholecalciferol 400 units Tabs tablet Commonly known as:  VITAMIN D Take 400 Units by mouth.   fexofenadine 180 MG tablet Commonly known as:  ALLEGRA Take 180 mg by mouth daily.   fluocinonide cream 0.05 % Commonly known as:  LIDEX Apply 1 application topically 2 (two) times daily as needed (FOR PSORIASIS).   levothyroxine 50 MCG tablet Commonly known as:  SYNTHROID, LEVOTHROID Take 1 tablet (50 mcg total) by mouth daily before breakfast.   magnesium oxide 400 MG tablet Commonly known as:  MAG-OX Take 200 mg by mouth daily.   ONE-A-DAY WOMENS 50+ ADVANTAGE PO Take by mouth daily.   PROBIOTIC DAILY Caps Take 1 capsule by mouth daily.       Follow-up Information    Binnie Rail, MD Follow up.   Specialty:  Internal Medicine Contact information: [place]  [phone]            The results of significant diagnostics from this hospitalization (including imaging, microbiology, ancillary and laboratory) are listed below for reference.     Microbiology: Recent Results (from the past 240 hour(s))  MRSA PCR Screening     Status: None   Collection Time: 04/21/16 12:07 AM  Result Value Ref Range Status   MRSA by PCR NEGATIVE NEGATIVE Final    Comment:        The GeneXpert MRSA Assay (FDA approved for NASAL specimens only), is one component of a comprehensive MRSA colonization surveillance program. It is not intended to diagnose MRSA infection nor to guide or monitor treatment for MRSA infections.      Labs: Basic Metabolic Panel:  Recent Labs Lab 04/20/16 1309  NA 136  K 4.0  CL 99*  CO2 25  GLUCOSE 113*  BUN 9  CREATININE 0.70  CALCIUM 9.6   CBC:  Recent Labs Lab 04/20/16 1309  WBC 6.7  HGB 15.6*   HCT 44.4  MCV 89.5  PLT 257   SIGNED: Time coordinating discharge:  30 minutes  Faye Ramsay, MD  [facility] 04/21/2016, 6:20 PM Pager [phone]  If 7PM-7AM, please contact night-coverage [email or web address] Password [email or web address]

## 2016-04-21 NOTE — Discharge Instructions (Signed)
Chest Wall Pain Chest wall pain is pain in or around the bones and muscles of your chest. Sometimes, an injury causes this pain. Sometimes, the cause may not be known. This pain may take several weeks or longer to get better. Follow these instructions at home: Pay attention to any changes in your symptoms. Take these actions to help with your pain:  Rest as told by your doctor.  Avoid activities that cause pain. Try not to use your chest, belly (abdominal), or side muscles to lift heavy things.  If directed, apply ice to the painful area:  Put ice in a plastic bag.  Place a towel between your skin and the bag.  Leave the ice on for 20 minutes, 2-3 times per day.  Take over-the-counter and prescription medicines only as told by your doctor.  Do not use tobacco products, including cigarettes, chewing tobacco, and e-cigarettes. If you need help quitting, ask your doctor.  Keep all follow-up visits as told by your doctor. This is important. Contact a doctor if:  You have a fever.  Your chest pain gets worse.  You have new symptoms. Get help right away if:  You feel sick to your stomach (nauseous) or you throw up (vomit).  You feel sweaty or light-headed.  You have a cough with phlegm (sputum) or you cough up blood.  You are short of breath. This information is not intended to replace advice given to you by your health care provider. Make sure you discuss any questions you have with your health care provider. Document Released: 06/28/2007 Document Revised: 06/17/2015 Document Reviewed: 04/06/2014 Elsevier Interactive Patient Education  2017 [employer].

## 2016-04-21 NOTE — Consult Note (Signed)
Cardiology Consult    Patient ID: Tina Lucas MRN: [id number], DOB/AGE: September 05, 1950   Admit date: 04/20/2016 Date of Consult: 04/21/2016  Primary Physician: Binnie Rail, MD Reason for Consult: Chest pain, abnormal EKG Primary Cardiologist: New Requesting Provider: Dr. Audelia Hives  History of Present Illness    Tina Lucas is a 66 y.o. with past medical history significant for hypertension, hypothyroidism and vertigo. She has been having intermittent chest pain over the last 3 weeks that worsened on Monday. We have been asked to consult for evaluation of chest pain and abnormal EKG.  On 04/20/16 the patient presented to her PCP with GERD symptoms over the precious 3 weeks which were intermittent and progressed to be more constant. On Monday she noted a heaviness in her chest. An EKG was done revealing Sinus  Rhythm, LAFB, LVH, Poor wave progression, possible prior anterior MI. Given the patient's chest discomfort and abnormal EKG she was referred to the ED. Her BP was also elevated at 176/96.  She has no known history of MI or cardiac issues and has not seen a cardiologist. No previous cardiac testing. She is very active, playing tournament [place] and working out at a gym regularly. She has had intermittent substernal pressure and unusual frequent burping for the last 3 weeks that she attributed to reflux. She has not taken any H2 blockers or PPI. She tried Pepto bisom once without any relief. On Monday she played in a [employer] tournament and after the second match she noted that the pressure was a little worse and it became constant and steady. She worked out at [employer] on Tuesday despite the discomfort. She has had no shortness of breath, lightheadedness, palpitations, orthopnea, diaphoresis or nausea. The chest pressure slightly increases with deep breath and is uncomfortable with lying flat on her back and slightly improved with leaning forward. She has mild tenderness to palpation around  the lower sternal borders. The pain does not increase with leaning forward. In the ED a GI cocktail made her pain worse.   She started taking celebrex in 01/2015 for arthritis pain in her neck. She has never smoked. She drinks one 5 ounce glass of wine per night. She has no family history of heart disease except that her brother has atrial fib.   Troponins:  0.00, 0.02 EKG: Normal sinus rhythm, 81 bpm, Right atrial enlargement, Pulmonary disease pattern, Incomplete right bundle branch block, Left anterior fascicular block (No previous EKG for comparison) SCr 0.70,  K+ 4.0,  Hgb 15.6 CXR:  No evidence of acute cardiopulmonary disease. Hyperinflation suggesting COPD.  Past Medical History   Past Medical History:  Diagnosis Date  . Allergic rhinitis   . History of asthma 1956-2000  . Hypothyroid 2013  . Primary HSV infection of mouth 2008  . 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 TONSILLECTOMY  1958     Allergies  No Known Allergies  Inpatient Medications    . acidophilus  1 capsule Oral Daily  . aspirin EC  81 mg Oral Daily  . calcium-vitamin D  1 tablet Oral Q breakfast  . cholecalciferol  1,000 Units Oral Daily  . enoxaparin (LOVENOX) injection  40 mg Subcutaneous Daily  . famotidine  20 mg Oral BID  . levothyroxine  50 mcg Oral QAC breakfast  . magnesium oxide  200 mg Oral Daily  . multivitamin with minerals  1 tablet  Oral Daily    Family History    Family History  Problem Relation Age of Onset  . Breast cancer Mother 55  . Osteoarthritis Father   . Hypertension Father   . Leukemia Maternal Aunt 7  . Atrial fibrillation Brother     Social History    Social History   Social History  . Marital status: Married    Spouse name: N/A  . Number of children: N/A  . Years of education: N/A   Occupational History  . Not on file.   Social History Main  Topics  . Smoking status: Never Smoker  . Smokeless tobacco: Never Used  . Alcohol use 0.0 oz/week  . Drug use: No  . Sexual activity: Not on file   Other Topics Concern  . Not on file   Social History Narrative   Lives with husband in a one story home.  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     Review of Systems   General:  No chills, fever, night sweats or weight changes.  Cardiovascular:  Chest pain as above, No dyspnea on exertion, edema, orthopnea, palpitations, paroxysmal nocturnal dyspnea. Dermatological: No rash, lesions/masses Respiratory: No cough, dyspnea Urologic: No hematuria, dysuria Abdominal:   No nausea, vomiting, diarrhea, bright red blood per rectum, melena, or hematemesis Neurologic:  No visual changes, wkns, changes in mental status. All other systems reviewed and are otherwise negative except as noted above.  Physical Exam    Blood pressure 137/83, pulse 69, temperature 97.5 F (36.4 C), temperature source Oral, resp. rate 18, height 5\' 8"  (1.727 m), weight 122 lb 9.2 oz (55.6 kg), SpO2 98 %.  General: Pleasant, NAD Psych: Normal affect. Neuro: Alert and oriented X 3. Moves all extremities spontaneously. HEENT: Normal  Neck: Supple without bruits or JVD. Lungs:  Resp regular and unlabored, CTA. Heart: RRR no s3, s4, or murmurs. Abdomen: Soft, non-tender, non-distended, BS + x 4.  Extremities: No clubbing, cyanosis or edema. DP/PT/Radials 2+ and equal bilaterally.  Labs    Troponin [facility] Test)  Recent Labs  04/20/16 1736  TROPIPOC 0.02   No results for input(s): CKTOTAL, CKMB, TROPONINI in the last 72 hours. Lab Results  Component Value Date   WBC 6.7 04/20/2016   HGB 15.6 (H) 04/20/2016   HCT 44.4 04/20/2016   MCV 89.5 04/20/2016   PLT 257 04/20/2016     Recent Labs Lab 04/20/16 1309  NA 136  K 4.0  CL 99*  CO2 25  BUN 9  CREATININE 0.70  CALCIUM 9.6  GLUCOSE 113*   Lab Results  Component Value Date    CHOL 212 (H) 02/02/2016   HDL 86.20 02/02/2016   LDLCALC 114 (H) 02/02/2016   TRIG 56.0 02/02/2016   No results found for: [facility]   Radiology Studies    Dg Chest 2 View  Result Date: 04/20/2016 CLINICAL DATA:  Intermittent chest pain, worse today. EXAM: CHEST  2 VIEW COMPARISON:  None. FINDINGS: The cardiomediastinal silhouette is within normal limits. The lungs are hyperinflated without evidence of airspace consolidation, edema, pleural effusion, or pneumothorax. There is moderate thoracolumbar scoliosis. IMPRESSION: 1. No evidence of acute cardiopulmonary disease. 2. Hyperinflation suggesting COPD. Electronically Signed   By: Logan Bores M.D.   On: 04/20/2016 13:35    EKG & Cardiac Imaging    EKG:  Normal sinus rhythm, 81 bpm, Right atrial enlargement, Pulmonary disease pattern, Incomplete right bundle branch block, Left anterior fascicular block (No  previous EKG for comparison)   Echocardiogram: pending  Assessment & Plan    Chest pain -No previous cardiac history -Patient's chest pressure is somewhat atypical, mild pressure, worse in certain positions and taking a deep breath and constant since Monday. No associated shortness of breath, lightheadedness, palpitations, diaphoresis or nausea. She has been able to be active through the pain. -EKG shows some non acute changes including left anterior fascicular block and abnormal r wave progression (no previous EKG for comparison) -Troponins are negative for 2 occurrences. CXR is negative.  -Will check echo for wall motion, LV function and valve status.  -I do not think this patient is presenting with ACS.  -Differentials include cardiac ischemia (low level of suspicion), GERD with recent use of celebrex, costochondritis given her very active [employer] playing and working out with weights at the gym, pericarditis given worsened discomfort with deep breath and certain positions (no indication on EKG). -Will check coronary CTA to  evaluate coronary arteries and calcium score. SCr is 0.70  HTN -Pt is noted to have white coat hypertension at her PCP. Reportedly her BP when checked at the pharmacy is well controlled. Not currently treated.  -Blood pressures have been mildly elevated although the patient is quite anxious. Would continue to monitor.  Labetalol prn per IM.   Hypothyroid -Continue synthroid  Signed, Daune Perch, NP-C 04/21/2016, 2:20 PM Pager: [phone]  Patient seen, examined. Available data reviewed. Agree with findings, assessment, and plan as outlined by Daune Perch, NP-C. Alert, pleasant woman in NAD, anxious. Lungs CTA, heart RRR no murmur or gallop, carotids 2+ no bruit, abdomen soft, NT, extremities no edema. Otherwise as above.   EKG show NSR with LAFB pattern, troponin (-) x 2. Symptoms with typical and atypical features. Recommend coronary CTA to evaluate for obstructive CAD. Will establish 18 gauge IV, give metoprolol 50 mg orally x 1 to slow heart rate for the scan, and FU with patient once scan completed.   Sherren Mocha, M.D. 04/21/2016 3:26 PM

## 2016-04-26 NOTE — Progress Notes (Signed)
Subjective:    Patient ID: Tina Lucas, female    DOB: 1950/12/29, 66 y.o.   MRN: [id number]  HPI The patient is here for follow up from the hospital.  She was admitted 04/20/16 - 04/21/16 after seeing me that day for evaluation of chest pain.    She was having intermittent chest pain for three weeks.  It was somewhat atypical chest pain.  Her EKG was sinus rhythm, RBBB, LAFB.  Her toponin was negative x 2.  She had a coronary CTA and her calcium score was 46.  She has mild non obstructive CAD.  She has moderate concentric LVH.  She has a mildly dilated pulmonary artery.    She is still have chest discomfort.  The discomfort is tolerable, but she wants to know what it is from.  It was initially intermittent but now is more constant.  She has increased burping, but denies true reflux symptoms.  Her chest wall is slightly tender, but she has also bee pushing on her chest a lot.  She is very active and can exercise with the pain.   Medications and allergies reviewed with patient and updated if appropriate.  Patient Active Problem List   Diagnosis Date Noted  . Chest pain 04/20/2016  . Essential hypertension 04/20/2016  . GERD (gastroesophageal reflux disease) 04/20/2016  . Numbness 08/06/2015  . Ecchymoses, spontaneous 06/25/2015  . Osteopenia 12/10/2014  . Allergic urticaria 12/10/2014  . Pressure in head 04/01/2014  . Dizziness 02/10/2014  . Elevated blood pressure reading 11/06/2013  . Hypothyroid   . Primary HSV infection of mouth     Current Outpatient Prescriptions on File Prior to Visit  Medication Sig Dispense Refill  . bismuth subsalicylate (PEPTO BISMOL) 262 MG/15ML suspension Take 5 mLs by mouth every 6 (six) hours as needed.    . Calcium Carbonate-Vitamin D (CALTRATE 600+D) 600-400 MG-UNIT per tablet Take 1 tablet by mouth daily.    . celecoxib (CELEBREX) 100 MG capsule Take 100 mg by mouth daily.    . cholecalciferol (VITAMIN D) 400 UNITS TABS tablet Take 400 Units by  mouth.    . fexofenadine (ALLEGRA) 180 MG tablet Take 180 mg by mouth daily.    . fluocinonide cream (LIDEX) 5.36 % Apply 1 application topically 2 (two) times daily as needed (FOR PSORIASIS).     Marland Kitchen levothyroxine (SYNTHROID, LEVOTHROID) 50 MCG tablet Take 1 tablet (50 mcg total) by mouth daily before breakfast. 90 tablet 3  . magnesium oxide (MAG-OX) 400 MG tablet Take 200 mg by mouth daily.    . Multiple Vitamins-Minerals (ONE-A-DAY WOMENS 50+ ADVANTAGE PO) Take by mouth daily.    . Probiotic Product (PROBIOTIC DAILY) CAPS Take 1 capsule by mouth daily.     No current facility-administered medications on file prior to visit.     Past Medical History:  Diagnosis Date  . Allergic rhinitis   . History of asthma 1956-2000  . Hypothyroid 2013  . Primary HSV infection of mouth 2008  . 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 TONSILLECTOMY  1958    Social History   Social History  . Marital status: Married    Spouse name: N/A  . Number of children: N/A  . Years of education: N/A   Social History Main Topics  . Smoking status: Never Smoker  . Smokeless tobacco: Never Used  . Alcohol use 0.0 oz/week  . Drug  use: No  . Sexual activity: Not on file   Other Topics Concern  . Not on file   Social History Narrative   Lives with husband in a one story home.  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    Family History  Problem Relation Age of Onset  . Breast cancer Mother 62  . Osteoarthritis Father   . Hypertension Father   . Leukemia Maternal Aunt 42  . Atrial fibrillation Brother     Review of Systems  Constitutional: Negative for fever.  Respiratory: Negative for cough, shortness of breath and wheezing.   Cardiovascular: Positive for chest pain. Negative for palpitations and leg swelling.  Gastrointestinal: Negative for abdominal pain and nausea.        Objective:   Vitals:   04/27/16 1003  BP: (!) 142/60  Pulse: 71  Temp: 97.7 F (36.5 C)   Wt Readings from Last 3 Encounters:  04/27/16 127 lb 1.9 oz (57.7 kg)  04/21/16 122 lb 9.2 oz (55.6 kg)  04/20/16 128 lb (58.1 kg)   Body mass index is 19.33 kg/m.   Physical Exam    Constitutional: Appears well-developed and well-nourished. No distress.  HENT:  Head: Normocephalic and atraumatic.  Neck: Neck supple. No tracheal deviation present. No thyromegaly present.  No cervical lymphadenopathy Cardiovascular: Normal rate, regular rhythm and normal heart sounds.   No murmur heard. No carotid bruit .  No edema Pulmonary/Chest: Chest wall in sternal area slightly tender with palpation, Effort normal and breath sounds normal. No respiratory distress. No has no wheezes. No rales.  Skin: Skin is warm and dry. Not diaphoretic.  Psychiatric: Normal mood and affect. Behavior is normal.      Assessment & Plan:    See Problem List for Assessment and Plan of chronic medical problems.

## 2016-04-27 ENCOUNTER — Encounter: Payer: Self-pay | Admitting: Internal Medicine

## 2016-04-27 ENCOUNTER — Ambulatory Visit (INDEPENDENT_AMBULATORY_CARE_PROVIDER_SITE_OTHER): Payer: Medicare Other | Admitting: Internal Medicine

## 2016-04-27 VITALS — BP 142/60 | HR 71 | Temp 97.7°F | Ht 68.0 in | Wt 127.1 lb

## 2016-04-27 DIAGNOSIS — R0789 Other chest pain: Secondary | ICD-10-CM | POA: Diagnosis not present

## 2016-04-27 MED ORDER — OMEPRAZOLE 40 MG PO CPDR: 40.0000 mg | DELAYED_RELEASE_CAPSULE | Freq: Every day | ORAL | 3 refills | Status: DC

## 2016-04-27 NOTE — Patient Instructions (Signed)
Try taking the omeprazole for 3 weeks.   Try icing the chest wall.      Costochondritis Costochondritis is swelling and irritation (inflammation) of the tissue (cartilage) that connects your ribs to your breastbone (sternum). This causes pain in the front of your chest. The pain usually starts gradually and involves more than one rib. What are the causes? The exact cause of this condition is not always known. It results from stress on the cartilage where your ribs attach to your sternum. The cause of this stress could be:  Chest injury (trauma).  Exercise or activity, such as lifting.  Severe coughing. What increases the risk? You may be at higher risk for this condition if you:  Are female.  Are 66 years old.  Recently started a new exercise or work activity.  Have low levels of vitamin D.  Have a condition that makes you cough frequently. What are the signs or symptoms? The main symptom of this condition is chest pain. The pain:  Usually starts gradually and can be sharp or dull.  Gets worse with deep breathing, coughing, or exercise.  Gets better with rest.  May be worse when you press on the sternum-rib connection (tenderness). How is this diagnosed? This condition is diagnosed based on your symptoms, medical history, and a physical exam. Your health care provider will check for tenderness when pressing on your sternum. This is the most important finding. You may also have tests to rule out other causes of chest pain. These may include:  A chest X-ray to check for lung problems.  An electrocardiogram (ECG) to see if you have a heart problem that could be causing the pain.  An imaging scan to rule out a chest or rib fracture. How is this treated? This condition usually goes away on its own over time. Your health care provider may prescribe an NSAID to reduce pain and inflammation. Your health care provider may also suggest that you:  Rest and avoid activities  that make pain worse.  Apply heat or cold to the area to reduce pain and inflammation.  Do exercises to stretch your chest muscles. If these treatments do not help, your health care provider may inject a numbing medicine at the sternum-rib connection to help relieve the pain. Follow these instructions at home:  Avoid activities that make pain worse. This includes any activities that use chest, abdominal, and side muscles.  If directed, put ice on the painful area:  Put ice in a plastic bag.  Place a towel between your skin and the bag.  Leave the ice on for 20 minutes, 2-3 times a day.  If directed, apply heat to the affected area as often as told by your health care provider. Use the heat source that your health care provider recommends, such as a moist heat pack or a heating pad.  Place a towel between your skin and the heat source.  Leave the heat on for 20-30 minutes.  Remove the heat if your skin turns bright red. This is especially important if you are unable to feel pain, heat, or cold. You may have a greater risk of getting burned.  Take over-the-counter and prescription medicines only as told by your health care provider.  Return to your normal activities as told by your health care provider. Ask your health care provider what activities are safe for you.  Keep all follow-up visits as told by your health care provider. This is important. Contact a health care provider  if:  You have chills or a fever.  Your pain does not go away or it gets worse.  You have a cough that does not go away (is persistent). Get help right away if:  You have shortness of breath. This information is not intended to replace advice given to you by your health care provider. Make sure you discuss any questions you have with your health care provider. Document Released: 10/19/2004 Document Revised: 07/30/2015 Document Reviewed: 05/05/2015 Elsevier Interactive Patient Education  2017 [employer].

## 2016-04-27 NOTE — Assessment & Plan Note (Signed)
?   Costochondritis vs atypical GERD No cardiac or pulmonary in nature She has components of both - deferred GI/EGD at this time Will try 3 weeks of prilosec to see if that helps and then will get her off medication Treat pain symptomatically - taking celebrex once/day and does not want to increase  -- can apply ice, heat or we can try a topical pain reliever if needed No restrictions for activity

## 2016-04-27 NOTE — Progress Notes (Signed)
Pre visit review using our clinic review tool, if applicable. No additional management support is needed unless otherwise documented below in the visit note. 

## 2016-05-04 ENCOUNTER — Ambulatory Visit (INDEPENDENT_AMBULATORY_CARE_PROVIDER_SITE_OTHER): Payer: Medicare Other | Admitting: Podiatry

## 2016-05-04 DIAGNOSIS — B351 Tinea unguium: Secondary | ICD-10-CM | POA: Diagnosis not present

## 2016-05-07 NOTE — Progress Notes (Signed)
Subjective:     Patient ID: Tina Lucas, female   DOB: Sep 23, 1950, 66 y.o.   MRN: [id number]  HPI patient presents with elongated nails 1-5 bilateral that she cannot cut and they're painful   Review of Systems     Objective:   Physical Exam Neurovascular status intact with patient found have incurvated thickened painful nailbeds 1-5 both feet    Assessment:     Mycotic nail infections with pain 1-5 both feet    Plan:     Debris painful nailbeds 1-5 both feet with no iatrogenic bleeding noted

## 2016-06-12 ENCOUNTER — Other Ambulatory Visit: Payer: Self-pay | Admitting: Internal Medicine

## 2016-08-17 LAB — HM MAMMOGRAPHY

## 2016-08-31 ENCOUNTER — Ambulatory Visit (INDEPENDENT_AMBULATORY_CARE_PROVIDER_SITE_OTHER): Payer: Medicare Other | Admitting: Podiatry

## 2016-08-31 ENCOUNTER — Encounter: Payer: Self-pay | Admitting: Podiatry

## 2016-08-31 DIAGNOSIS — B351 Tinea unguium: Secondary | ICD-10-CM | POA: Diagnosis not present

## 2016-08-31 NOTE — Progress Notes (Signed)
Subjective:    Patient ID: Tina Lucas, female   DOB: 66 y.o.   MRN: [id number]   HPI patient presents stating she has thick nails that she cannot cut    ROS      Objective:  Physical Exam neurovascular status intact with thick yellow brittle nailbeds 1-5 both feet that are not currently painful     Assessment:    Mycotic nail infection 1-5 both feet with no current pain     Plan:     Debride nails 1-5 both feet

## 2016-09-05 ENCOUNTER — Encounter: Payer: Self-pay | Admitting: Internal Medicine

## 2016-09-07 ENCOUNTER — Ambulatory Visit: Payer: Medicare Other | Admitting: Podiatry

## 2016-09-11 ENCOUNTER — Encounter: Payer: Self-pay | Admitting: Internal Medicine

## 2016-12-28 ENCOUNTER — Ambulatory Visit: Payer: Medicare Other | Admitting: Podiatry

## 2016-12-28 ENCOUNTER — Encounter: Payer: Self-pay | Admitting: Podiatry

## 2016-12-28 DIAGNOSIS — B351 Tinea unguium: Secondary | ICD-10-CM | POA: Diagnosis not present

## 2016-12-28 NOTE — Progress Notes (Signed)
Subjective:   Patient ID: Tina Lucas, female   DOB: 66 y.o.   MRN: [id number]   HPI Patient presents with thick toenails 1-5 both feet.   ROS      Objective:  Physical Exam  Neurovascular status intact with thick nailbeds 1-5 both feet.      Assessment:  Mycotic nail infection 1-5 both feet     Plan:  Debrided nailbeds 1-5 both feet with no iatrogenic bleeding and reappoint for routine care

## 2017-01-04 ENCOUNTER — Ambulatory Visit: Payer: Medicare Other | Admitting: Podiatry

## 2017-01-31 ENCOUNTER — Other Ambulatory Visit: Payer: Self-pay | Admitting: Internal Medicine

## 2017-02-14 ENCOUNTER — Encounter: Payer: Self-pay | Admitting: Internal Medicine

## 2017-02-14 ENCOUNTER — Ambulatory Visit: Payer: Medicare Other | Admitting: Internal Medicine

## 2017-02-14 VITALS — BP 144/90 | HR 84 | Temp 97.7°F | Resp 16 | Wt 126.0 lb

## 2017-02-14 DIAGNOSIS — M542 Cervicalgia: Secondary | ICD-10-CM

## 2017-02-14 DIAGNOSIS — R42 Dizziness and giddiness: Secondary | ICD-10-CM | POA: Diagnosis not present

## 2017-02-14 NOTE — Assessment & Plan Note (Addendum)
Increased vertigo/neck stiffness/ balance difficulties over the course of the past few weeks Has not tolerated medications in the past and does not want to try any medication Has tried the Epley maneuver and it was not helpful She is experiencing increased neck stiffness and pressure and discomfort/pressure in the back of her head, which she feels is the cause-this was the cause a few years ago as well She did want to discuss to make sure I did not think anything else is going on and I do not think there is anything else going on We both agree physical therapy would be the best-this has helped in the past She will decide where she wants to go and I will then refer her

## 2017-02-14 NOTE — Progress Notes (Signed)
Subjective:    Patient ID: Tina Lucas, female    DOB: October 13, 1950, 67 y.o.   MRN: [id number]  HPI She is here for an acute visit.    Vertigo:  She has a history of BPPV and cervical vertigo.  January 3rd she woke up with increased dizziness/balance issues.  It has been controlled for a while.  She has done PT extensively in the past.  She has tried the epley maneuver on herself and it did not help.  She does not think this vertigo is BPPV - it is from the suboccipital region.  She has had intermittent neck stiffness and can feel a lot of pressure in the back of head/upper neck.  PT has helped with this in the past.    She has not exercised in the past two weeks.  She has tried intermittently to exercise since the beginning of the month and it increases her symptoms.    She is frustrated by not being able to exercise.  She is extremely active.  Medications and allergies reviewed with patient and updated if appropriate.  Patient Active Problem List   Diagnosis Date Noted  . Chest pain 04/20/2016  . Essential hypertension 04/20/2016  . GERD (gastroesophageal reflux disease) 04/20/2016  . Numbness 08/06/2015  . Ecchymoses, spontaneous 06/25/2015  . Osteopenia 12/10/2014  . Allergic urticaria 12/10/2014  . Pressure in head 04/01/2014  . Dizziness 02/10/2014  . Elevated blood pressure reading 11/06/2013  . Hypothyroid   . Primary HSV infection of mouth     Current Outpatient Medications on File Prior to Visit  Medication Sig Dispense Refill  . Calcium Carbonate-Vitamin D (CALTRATE 600+D) 600-400 MG-UNIT per tablet Take 1 tablet by mouth daily.    . celecoxib (CELEBREX) 100 MG capsule Take 100 mg by mouth daily.    . cholecalciferol (VITAMIN D) 400 UNITS TABS tablet Take 400 Units by mouth.    . fexofenadine (ALLEGRA) 180 MG tablet Take 180 mg by mouth daily.    . fluocinonide cream (LIDEX) 4.19 % Apply 1 application topically 2 (two) times daily as needed (FOR PSORIASIS).     Marland Kitchen  levothyroxine (SYNTHROID, LEVOTHROID) 50 MCG tablet Take 1 tablet (50 mcg total) by mouth daily before breakfast. -- Office visit needed for further refills 90 tablet 0  . magnesium oxide (MAG-OX) 400 MG tablet Take 125 mg by mouth daily.     . Multiple Vitamins-Minerals (ONE-A-DAY WOMENS 50+ ADVANTAGE PO) Take by mouth daily.    . Probiotic Product (PROBIOTIC DAILY) CAPS Take 1 capsule by mouth daily.     No current facility-administered medications on file prior to visit.     Past Medical History:  Diagnosis Date  . Allergic rhinitis   . History of asthma 1956-2000  . Hypothyroid 2013  . Primary HSV infection of mouth 2008  . 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 TONSILLECTOMY  1958    Social History   Socioeconomic History  . Marital status: Married    Spouse name: Not on file  . Number of children: Not on file  . Years of education: Not on file  . Highest education level: Not on file  Social Needs  . Financial resource strain: Not on file  . Food insecurity - worry: Not on file  . Food insecurity - inability: Not on file  . Transportation needs - medical: Not on file  .  Transportation needs - non-medical: Not on file  Occupational History  . Not on file  Tobacco Use  . Smoking status: Never Smoker  . Smokeless tobacco: Never Used  Substance and Sexual Activity  . Alcohol use: Yes    Alcohol/week: 0.0 oz  . Drug use: No  . Sexual activity: Not on file  Other Topics Concern  . Not on file  Social History Narrative   Lives with husband in a one story home.  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    Family History  Problem Relation Age of Onset  . Breast cancer Mother 48  . Osteoarthritis Father   . Hypertension Father   . Leukemia Maternal Aunt 55  . Atrial fibrillation Brother     Review of Systems  Constitutional: Negative for  fever.  HENT: Negative for congestion and ear pain.   Eyes: Negative for visual disturbance.  Musculoskeletal: Positive for gait problem (related to dizziness) and neck pain.  Neurological: Positive for dizziness. Negative for numbness and headaches.       Objective:   Vitals:   02/14/17 0817  BP: (!) 144/90  Pulse: 84  Resp: 16  Temp: 97.7 F (36.5 C)  SpO2: 98%   Wt Readings from Last 3 Encounters:  02/14/17 126 lb (57.2 kg)  04/27/16 127 lb 1.9 oz (57.7 kg)  04/21/16 122 lb 9.2 oz (55.6 kg)   Body mass index is 19.16 kg/m.   Physical Exam    Constitutional: Appears well-developed and well-nourished. No distress.  HENT:  Head: Normocephalic and atraumatic.  Msk: no tenderness in upper back, c-spine and occipital region of head Neuro: CN II-XII intact, neurological exam nonfocal Skin: Skin is warm and dry. Not diaphoretic.  Psychiatric: Normal mood and affect. Behavior is normal.      Assessment & Plan:    See Problem List for Assessment and Plan of chronic medical problems.

## 2017-02-14 NOTE — Patient Instructions (Signed)
Let me know where you want to go to PT and I will put in a referral.

## 2017-02-21 ENCOUNTER — Encounter: Payer: Self-pay | Admitting: Physical Therapy

## 2017-02-21 ENCOUNTER — Other Ambulatory Visit: Payer: Self-pay

## 2017-02-21 ENCOUNTER — Ambulatory Visit: Payer: Medicare Other | Attending: Internal Medicine | Admitting: Physical Therapy

## 2017-02-21 DIAGNOSIS — M542 Cervicalgia: Secondary | ICD-10-CM | POA: Diagnosis present

## 2017-02-21 DIAGNOSIS — M62838 Other muscle spasm: Secondary | ICD-10-CM | POA: Insufficient documentation

## 2017-02-21 NOTE — Therapy (Signed)
[facility] [address], [place], [place], [address] Phone: [phone]   Fax:  [phone]  Physical Therapy Evaluation  Patient Details  Name: Tina Lucas MRN: [id number] Date of Birth: [birth year] Referring Provider: Binnie Rail, MD   Encounter Date: 02/21/2017  PT End of Session - 02/21/17 1149    Visit Number  1    Date for PT Re-Evaluation  04/18/17    Authorization Type  medicare    PT Start Time  1055    PT Stop Time  1145    PT Time Calculation (min)  50 min    Activity Tolerance  Treatment limited secondary to medical complications (Comment)    Behavior During Therapy  [facility] for tasks assessed/performed       Past Medical History:  Diagnosis Date  . Allergic rhinitis   . History of asthma 1956-2000  . Hypothyroid 2013  . Primary HSV infection of mouth 2008  . 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 TONSILLECTOMY  1958    There were no vitals filed for this visit.   Subjective Assessment - 02/21/17 1059    Subjective  I had BPPV since Feb 2016.  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  She walks regularly .  States she played Monday for the first time in a couple of weeks, but I became very tight.  My neck is very tight which is connected to feeling dizzy.  The BPPV has been corrected but the cervical tension has been going on for about the same amount of time.    Limitations  Walking athletic activities    Currently in Pain?  Yes    Pain Score  5     Pain Location  Neck    Pain Orientation  Mid;Upper    Pain Descriptors / Indicators  Pressure;Heaviness    Pain Type  Acute pain    Pain Onset  1 to 4 weeks ago    Pain Frequency  Intermittent    Aggravating Factors   sleeping, playing pickle ball    Pain Relieving Factors  nothing    Effect of Pain on Daily Activities   doing recreation activities         [facility] PT Assessment - 02/21/17 0001      Assessment   Medical Diagnosis  R42 (ICD-10-CM) - Vertigo; M54.2 (ICD-10-CM) - Neck pain    Referring Provider  Binnie Rail, MD    Onset Date/Surgical Date  01/25/17 for this recent flare up    Prior Therapy  yes      Precautions   Precautions  None      Restrictions   Weight Bearing Restrictions  No      Balance Screen   Has the patient fallen in the past 6 months  No      [place] residence    Living Arrangements  Spouse/significant other      Prior Function   Level of Independence  Independent      Cognition   Overall Cognitive Status  Within Functional Limits for tasks assessed      Observation/Other Assessments   Focus on Therapeutic Outcomes (FOTO)   47% limited      Posture/Postural Control   Posture/Postural Control  Postural limitations    Postural Limitations  Rounded Shoulders;Forward head;Increased thoracic kyphosis;Posterior  pelvic tilt scoliosis      ROM / Strength   AROM / PROM / Strength  AROM;PROM      AROM   Overall AROM Comments  all AROM cervical and lumbar increased dizziness    AROM Assessment Site  Shoulder;Cervical;Lumbar    Right Shoulder Flexion  -- WNL    Right Shoulder ABduction  -- WNL    Left Shoulder Flexion  -- WNL    Left Shoulder ABduction  -- WNL    Cervical Flexion  50    Cervical Extension  68 causes dizziness    Cervical - Right Side Bend  22    Cervical - Left Side Bend  10    Cervical - Right Rotation  55    Cervical - Left Rotation  65    Lumbar Flexion  50% limited    Lumbar Extension  50% limited      PROM   Overall PROM Comments  bilateral hip IR 50% limited      Flexibility   Soft Tissue Assessment /Muscle Length  yes    Hamstrings  50% limited      Palpation   Palpation comment  tight suboccipitals, cervial paraspinals, upper traps, scalenes      Special Tests    Special Tests  Cervical     Cervical Tests  Dictraction      Distraction Test   Findngs  Negative    Comment  no change in dizziness but it relieves pressure      Bed Mobility   Bed Mobility  Rolling Right;Right Sidelying to Sit    Rolling Right  7: Independent;Other (comment) holds head with hand to stabilize    Right Sidelying to Sit  7: Independent;Other (comment) holds head with hand to stabilize      Ambulation/Gait   Gait Pattern  Within Functional Limits      High Level Balance   High Level Balance Activites  Other (comment)    High Level Balance Comments  able to perform SLS 10+ seconds bilaterally without LOB or UE movements             Objective measurements completed on examination: See above findings.              PT Education - 02/21/17 1201    Education provided  Yes    Education Details  cervical ROM    Person(s) Educated  Patient    Methods  Explanation;Demonstration;Handout;Verbal cues    Comprehension  Verbalized understanding;Returned demonstration       PT Short Term Goals - 02/21/17 1302      PT SHORT TERM GOAL #1   Title  pt ind with initial HEP    Time  4    Period  Weeks    Status  New    Target Date  03/21/17      PT SHORT TERM GOAL #2   Title  pt will be able to perform bed mobility without using hands to support head    Time  4    Period  Weeks    Status  New    Target Date  03/21/17        PT Long Term Goals - 02/21/17 1305      PT LONG TERM GOAL #1   Title  pt will be ind with advanced HEP    Time  8    Period  Weeks    Status  New    Target Date  04/18/17      PT LONG TERM GOAL #2   Title  pt will be able to return to pickle ball without experiencing onset of neck pain/heaviness after playing    Time  8    Period  Weeks    Status  New    Target Date  04/18/17      PT LONG TERM GOAL #3   Title  FOTO < or = to 34% limited    Time  8    Period  Weeks    Status  New    Target Date  04/18/17      PT LONG TERM GOAL #4   Title   pt demonstrates improved cervical rotation to 65 deg bilaterally for improved ability to navigate traffic while driving    Period  Weeks    Status  New    Target Date  04/18/17             Plan - 02/21/17 1255    Clinical Impression Statement  Patient presents to clinic due to onset of cervical pain and stiffness that is associated with dizziness.  She is having difficulty with cervical movements in all directions causing increased symptoms of dizziness.  Pt demonstrates guarding of cervical region with all movements.  She uses UE support for neck during bed mobility.  Pt has postural limitations as mentioned above with decreased thoracic extension.  She has limited hip, lumbar, thoracic and cervical ROM both actively and passively.  Pt has muscle spasms in suboccipitals, cervical parapsinals, upper trap and scalenes.  Pt has hamstring flexibilty limited by 50%.  Based on symptoms, PT suspects cervicogenic dizziness.  Pt will benefit from skilled PT to address impairments in order to return to all recreational activities.    History and Personal Factors relevant to plan of care:  acute onset of chronic issue, history of BPPV    Clinical Presentation  Evolving    Clinical Decision Making  Moderate    Rehab Potential  Good    PT Frequency  2x / week reduce to 1x    PT Duration  8 weeks    PT Treatment/Interventions  ADLs/Self Care Home Management;Biofeedback;Cryotherapy;Electrical Stimulation;Moist Heat;Traction;Ultrasound;Gait training;Stair training;Functional mobility training;Therapeutic activities;Therapeutic exercise;Balance training;Neuromuscular re-education;Patient/family education;Manual techniques;Passive range of motion;Dry needling;Taping;Vestibular;Canalith Repostioning    PT Next Visit Plan  STM to cervical region, cervical traction, review initial HEP    PT Home Exercise Plan  progress as needed; patient likes to have pictures    Consulted and Agree with Plan of Care  Patient        Patient will benefit from skilled therapeutic intervention in order to improve the following deficits and impairments:  Pain, Postural dysfunction, Dizziness, Increased muscle spasms  Visit Diagnosis: Other muscle spasm  Cervicalgia     Problem List Patient Active Problem List   Diagnosis Date Noted  . Chest pain 04/20/2016  . Essential hypertension 04/20/2016  . GERD (gastroesophageal reflux disease) 04/20/2016  . Numbness 08/06/2015  . Ecchymoses, spontaneous 06/25/2015  . Osteopenia 12/10/2014  . Allergic urticaria 12/10/2014  . Pressure in head 04/01/2014  . Dizziness 02/10/2014  . Elevated blood pressure reading 11/06/2013  . Hypothyroid   . Primary HSV infection of mouth     Zannie Cove, PT 02/21/2017, 2:16 PM  [facility] [address] [place], [place], [address] Phone: [phone]   Fax:  [phone]  Name: Tina Lucas MRN: [id number] Date of Birth: 1950/03/22

## 2017-02-21 NOTE — Patient Instructions (Signed)
Do the following movements keeping you eye gaze straight ahead:  Upper Cervical Rotation    Rotate head slowly from side to side as if saying "no". Do not turn head completely to either side. Keep motion small Repeat __10__ times per set. Do _1___ sets per session. Do __2__ sessions per day.  http://orth.exer.us/375   Copyright  VHI. All rights reserved.   NECK: Cervical Extension    Raise head up. Look toward ceiling. _10__ reps per set, _1__ sets per day, __2_ days per week  Copyright  VHI. All rights reserved.  Nod: Cervical Flexion    Nod head, tipping chin down. Tighten muscles in the back of throat. Do _10__ times, _2__ times per day.  http://ss.exer.us/191   Copyright  VHI. All rights reserved.   [facility] [address] [place], [place] [address] Phone # [phone] Fax [phone]

## 2017-02-27 ENCOUNTER — Encounter: Payer: Self-pay | Admitting: Physical Therapy

## 2017-02-27 ENCOUNTER — Ambulatory Visit: Payer: Medicare Other | Attending: Internal Medicine | Admitting: Physical Therapy

## 2017-02-27 DIAGNOSIS — M542 Cervicalgia: Secondary | ICD-10-CM | POA: Diagnosis present

## 2017-02-27 DIAGNOSIS — M62838 Other muscle spasm: Secondary | ICD-10-CM | POA: Diagnosis not present

## 2017-02-27 NOTE — Patient Instructions (Signed)
Nod: Cervical Flexion    Nod head, tipping chin down. Tighten muscles in the back of throat. Do lying down on your back Do _10__ times, holding for 5 sec, do _1__ times per day.  http://ss.exer.us/191   Copyright  VHI. All rights reserved.   Log Roll    Log roll and try to relax neck before sitting up.  Drop legs off the bed and lift body up to the side   Copyright  VHI. All rights reserved.    [facility] [address], [place], [place] [address] Phone # [phone] Fax [phone]

## 2017-02-27 NOTE — Therapy (Signed)
[facility] [address], [place], [place], [address] Phone: [phone]   Fax:  [phone]  Physical Therapy Treatment  Patient Details  Name: Tina Lucas MRN: [id number] Date of Birth: [birth year] Referring Provider: Binnie Rail, MD   Encounter Date: 02/27/2017  PT End of Session - 02/27/17 1413    Visit Number  2    Date for PT Re-Evaluation  04/18/17    Authorization Type  medicare    PT Start Time  1231    PT Stop Time  1317    PT Time Calculation (min)  46 min    Activity Tolerance  Treatment limited secondary to medical complications (Comment)    Behavior During Therapy  [facility] for tasks assessed/performed       Past Medical History:  Diagnosis Date  . Allergic rhinitis   . History of asthma 1956-2000  . Hypothyroid 2013  . Primary HSV infection of mouth 2008  . 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 TONSILLECTOMY  1958    There were no vitals filed for this visit.  Subjective Assessment - 02/27/17 1410    Subjective  I had more tension in my neck after doing the exercises on HEP so I only did them twice.  This last weekend I was more active, then I played pickleball and was able to play 6 matches which I haven't done in a while.  I still feel my neck stiffness.  My dizziness is about 6/10 today.    Limitations  Walking sports    Currently in Pain?  No/denies                      [facility] Adult PT Treatment/Exercise - 02/27/17 0001      Self-Care   Self-Care  Other Self-Care Comments    Other Self-Care Comments   reviewed initial HEP and educated in doing smaller movements      Therapeutic Activites    Therapeutic Activities  Other Therapeutic Activities    Other Therapeutic Activities  rolling and supine to sit - educated and performed log roll      Exercises   Exercises   Neck      Neck Exercises: Supine   Capital Flexion  5 reps;5 secs      Manual Therapy   Manual Therapy  Myofascial release;Manual Traction    Myofascial Release  cervical and cranial MFR, suboccipitals, cervical paraspinals    Manual Traction  cervical traction             PT Education - 02/27/17 1317    Education provided  Yes    Education Details  nod and log roll    Person(s) Educated  Patient    Methods  Explanation;Demonstration    Comprehension  Verbalized understanding       PT Short Term Goals - 02/21/17 1302      PT SHORT TERM GOAL #1   Title  pt ind with initial HEP    Time  4    Period  Weeks    Status  New    Target Date  03/21/17      PT SHORT TERM GOAL #2   Title  pt will be able to perform bed mobility without using hands to support head    Time  4    Period  Weeks    Status  New    Target Date  03/21/17        PT Long Term Goals - 02/21/17 1305      PT LONG TERM GOAL #1   Title  pt will be ind with advanced HEP    Time  8    Period  Weeks    Status  New    Target Date  04/18/17      PT LONG TERM GOAL #2   Title  pt will be able to return to pickle ball without experiencing onset of neck pain/heaviness after playing    Time  8    Period  Weeks    Status  New    Target Date  04/18/17      PT LONG TERM GOAL #3   Title  FOTO < or = to 34% limited    Time  8    Period  Weeks    Status  New    Target Date  04/18/17      PT LONG TERM GOAL #4   Title  pt demonstrates improved cervical rotation to 65 deg bilaterally for improved ability to navigate traffic while driving    Period  Weeks    Status  New    Target Date  04/18/17            Plan - 02/27/17 1419    Clinical Impression Statement  Patient responded well to myofascial release and cervical traction with decreased tension palpated after treatment.  Pt reported feeling less tension and tightness in neck after the treatment.  She needed some review to HEP. She also was  able to demonstrate log roll and reported having less dizziness than she usually has.  Pt will benefit from skilled PT to work on neck mobility as well as head movements without increased dizziness and muscle spasms    PT Treatment/Interventions  ADLs/Self Care Home Management;Biofeedback;Cryotherapy;Electrical Stimulation;Moist Heat;Traction;Ultrasound;Gait training;Stair training;Functional mobility training;Therapeutic activities;Therapeutic exercise;Balance training;Neuromuscular re-education;Patient/family education;Manual techniques;Passive range of motion;Dry needling;Taping;Vestibular;Canalith Repostioning    PT Next Visit Plan  MFR and STM to cervical region, f/u on exercises and progress/adjust as needed    PT Home Exercise Plan  progress as needed; patient likes to have pictures    Consulted and Agree with Plan of Care  Patient       Patient will benefit from skilled therapeutic intervention in order to improve the following deficits and impairments:  Pain, Postural dysfunction, Dizziness, Increased muscle spasms  Visit Diagnosis: Other muscle spasm  Cervicalgia     Problem List Patient Active Problem List   Diagnosis Date Noted  . Chest pain 04/20/2016  . Essential hypertension 04/20/2016  . GERD (gastroesophageal reflux disease) 04/20/2016  . Numbness 08/06/2015  . Ecchymoses, spontaneous 06/25/2015  . Osteopenia 12/10/2014  . Allergic urticaria 12/10/2014  . Pressure in head 04/01/2014  . Dizziness 02/10/2014  . Elevated blood pressure reading 11/06/2013  . Hypothyroid   . Primary HSV infection of mouth     Zannie Cove, PT 02/27/2017, 2:24 PM  [facility] [address], [place], [place], [address] Phone: [phone]   Fax:  [phone]  Name: Tawna Alwin MRN: [id number] Date of Birth: 1950-07-08

## 2017-03-01 ENCOUNTER — Encounter: Payer: Self-pay | Admitting: Physical Therapy

## 2017-03-01 ENCOUNTER — Ambulatory Visit: Payer: Medicare Other | Admitting: Physical Therapy

## 2017-03-01 DIAGNOSIS — M62838 Other muscle spasm: Secondary | ICD-10-CM

## 2017-03-01 DIAGNOSIS — M542 Cervicalgia: Secondary | ICD-10-CM

## 2017-03-01 NOTE — Therapy (Signed)
[facility] [address] [place], [place], [address] Phone: [phone]   Fax:  [phone]  Physical Therapy Treatment  Patient Details  Name: Tina Lucas MRN: [id number] Date of Birth: 21-Aug-1950 Referring Provider: Binnie Rail, MD   Encounter Date: 03/01/2017  PT End of Session - 03/01/17 1530    Visit Number  3    Date for PT Re-Evaluation  04/18/17    Authorization Type  medicare    PT Start Time  1445    PT Stop Time  1532    PT Time Calculation (min)  47 min    Activity Tolerance  Treatment limited secondary to medical complications (Comment)    Behavior During Therapy  [facility] for tasks assessed/performed       Past Medical History:  Diagnosis Date  . Allergic rhinitis   . History of asthma 1956-2000  . Hypothyroid 2013  . Primary HSV infection of mouth 2008  . 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 TONSILLECTOMY  1958    There were no vitals filed for this visit.  Subjective Assessment - 03/01/17 1449    Subjective  Pt states she had great relief after the last session and had no pressure in the back of the head the rest of the day and all through the night.  She had a little pain come back after doing it in the bed.      Limitations  Walking sports    Currently in Pain?  No/denies                      [facility] Adult PT Treatment/Exercise - 03/01/17 0001      Self-Care   Other Self-Care Comments   reviewed HEP and pt to focus on cervical rotation and getting up from bed without using UE      Manual Therapy   Manual Therapy  Myofascial release;Manual Traction    Myofascial Release  cervical and cranial MFR, suboccipitals, cervical paraspinals               PT Short Term Goals - 03/01/17 1629      PT SHORT TERM GOAL #1   Title  pt ind with initial HEP    Baseline  still working on learning    Time  4    Period  Weeks    Status  On-going      PT SHORT TERM GOAL #2   Title  pt will be able to perform bed mobility without using hands to support head    Time  4    Period  Weeks    Status  On-going        PT Long Term Goals - 02/21/17 1305      PT LONG TERM GOAL #1   Title  pt will be ind with advanced HEP    Time  8    Period  Weeks    Status  New    Target Date  04/18/17      PT LONG TERM GOAL #2   Title  pt will be able to return to pickle ball without experiencing onset of neck pain/heaviness after playing    Time  8    Period  Weeks    Status  New    Target Date  04/18/17      PT LONG TERM GOAL #  3   Title  FOTO < or = to 34% limited    Time  8    Period  Weeks    Status  New    Target Date  04/18/17      PT LONG TERM GOAL #4   Title  pt demonstrates improved cervical rotation to 65 deg bilaterally for improved ability to navigate traffic while driving    Period  Weeks    Status  New    Target Date  04/18/17            Plan - 03/01/17 1621    Clinical Impression Statement  Patient responded well to myofascial release today.  She continues to have some dizziness when sitting up.  Pt was educated on doing rotation with gaze stabilizations for her HEP.  Pt educated on expected progression and that she will have to work through some dizziness and muscle tightness.  Pt will benefit from continued skilled PT to work on improved ROM and reduced dizziness and muscle spasms.    PT Treatment/Interventions  ADLs/Self Care Home Management;Biofeedback;Cryotherapy;Electrical Stimulation;Moist Heat;Traction;Ultrasound;Gait training;Stair training;Functional mobility training;Therapeutic activities;Therapeutic exercise;Balance training;Neuromuscular re-education;Patient/family education;Manual techniques;Passive range of motion;Dry needling;Taping;Vestibular;Canalith Repostioning    PT Next Visit Plan  MFR and STM to cervical  region, f/u on exercises and progress/adjust as needed    Consulted and Agree with Plan of Care  Patient       Patient will benefit from skilled therapeutic intervention in order to improve the following deficits and impairments:  Pain, Postural dysfunction, Dizziness, Increased muscle spasms  Visit Diagnosis: Other muscle spasm  Cervicalgia     Problem List Patient Active Problem List   Diagnosis Date Noted  . Chest pain 04/20/2016  . Essential hypertension 04/20/2016  . GERD (gastroesophageal reflux disease) 04/20/2016  . Numbness 08/06/2015  . Ecchymoses, spontaneous 06/25/2015  . Osteopenia 12/10/2014  . Allergic urticaria 12/10/2014  . Pressure in head 04/01/2014  . Dizziness 02/10/2014  . Elevated blood pressure reading 11/06/2013  . Hypothyroid   . Primary HSV infection of mouth     Zannie Cove, PT 03/01/2017, 4:35 PM  [facility] [address], [place], [place], [address] Phone: [phone]   Fax:  [phone]  Name: Tina Lucas MRN: [id number] Date of Birth: 01-24-1950

## 2017-03-06 ENCOUNTER — Ambulatory Visit: Payer: Medicare Other | Admitting: Physical Therapy

## 2017-03-06 ENCOUNTER — Encounter: Payer: Self-pay | Admitting: Physical Therapy

## 2017-03-06 DIAGNOSIS — M62838 Other muscle spasm: Secondary | ICD-10-CM | POA: Diagnosis not present

## 2017-03-06 DIAGNOSIS — M542 Cervicalgia: Secondary | ICD-10-CM

## 2017-03-06 DIAGNOSIS — R739 Hyperglycemia, unspecified: Secondary | ICD-10-CM | POA: Insufficient documentation

## 2017-03-06 NOTE — Patient Instructions (Addendum)
Check at your pharmacy to see if you have had a second pneumonia vaccine (pneumovax).  Consider a tetanus vaccine and shingles vaccine.   Test(s) ordered today. Your results will be released to Brady (or called to you) after review, usually within 72hours after test completion. If any changes need to be made, you will be notified at that same time.  All other Health Maintenance issues reviewed.   All recommended immunizations and age-appropriate screenings are up-to-date or discussed.  No immunizations administered today.   Medications reviewed and updated.  No changes recommended at this time.  Your prescription(s) have been submitted to your pharmacy. Please take as directed and contact our office if you believe you are having problem(s) with the medication(s).  Please followup in one year   Health Maintenance, Female Adopting a healthy lifestyle and getting preventive care can go a long way to promote health and wellness. Talk with your health care provider about what schedule of regular examinations is right for you. This is a good chance for you to check in with your provider about disease prevention and staying healthy. In between checkups, there are plenty of things you can do on your own. Experts have done a lot of research about which lifestyle changes and preventive measures are most likely to keep you healthy. Ask your health care provider for more information. Weight and diet Eat a healthy diet  Be sure to include plenty of vegetables, fruits, low-fat dairy products, and lean protein.  Do not eat a lot of foods high in solid fats, added sugars, or salt.  Get regular exercise. This is one of the most important things you can do for your health. ? Most adults should exercise for at least 150 minutes each week. The exercise should increase your heart rate and make you sweat (moderate-intensity exercise). ? Most adults should also do strengthening exercises at least twice a  week. This is in addition to the moderate-intensity exercise.  Maintain a healthy weight  Body mass index (BMI) is a measurement that can be used to identify possible weight problems. It estimates body fat based on height and weight. Your health care provider can help determine your BMI and help you achieve or maintain a healthy weight.  For females 30 years of age and older: ? A BMI below 18.5 is considered underweight. ? A BMI of 18.5 to 24.9 is normal. ? A BMI of 25 to 29.9 is considered overweight. ? A BMI of 30 and above is considered obese.  Watch levels of cholesterol and blood lipids  You should start having your blood tested for lipids and cholesterol at 68 years of age, then have this test every 5 years.  You may need to have your cholesterol levels checked more often if: ? Your lipid or cholesterol levels are high. ? You are older than 68 years of age. ? You are at high risk for heart disease.  Cancer screening Lung Cancer  Lung cancer screening is recommended for adults 35-58 years old who are at high risk for lung cancer because of a history of smoking.  A yearly low-dose CT scan of the lungs is recommended for people who: ? Currently smoke. ? Have quit within the past 15 years. ? Have at least a 30-pack-year history of smoking. A pack year is smoking an average of one pack of cigarettes a day for 1 year.  Yearly screening should continue until it has been 15 years since you quit.  Yearly screening  should stop if you develop a health problem that would prevent you from having lung cancer treatment.  Breast Cancer  Practice breast self-awareness. This means understanding how your breasts normally appear and feel.  It also means doing regular breast self-exams. Let your health care provider know about any changes, no matter how small.  If you are in your 20s or 30s, you should have a clinical breast exam (CBE) by a health care provider every 1-3 years as part of a  regular health exam.  If you are 28 or older, have a CBE every year. Also consider having a breast X-ray (mammogram) every year.  If you have a family history of breast cancer, talk to your health care provider about genetic screening.  If you are at high risk for breast cancer, talk to your health care provider about having an MRI and a mammogram every year.  Breast cancer gene (BRCA) assessment is recommended for women who have family members with BRCA-related cancers. BRCA-related cancers include: ? Breast. ? Ovarian. ? Tubal. ? Peritoneal cancers.  Results of the assessment will determine the need for genetic counseling and BRCA1 and BRCA2 testing.  Cervical Cancer Your health care provider may recommend that you be screened regularly for cancer of the pelvic organs (ovaries, uterus, and vagina). This screening involves a pelvic examination, including checking for microscopic changes to the surface of your cervix (Pap test). You may be encouraged to have this screening done every 3 years, beginning at age 47.  For women ages 55-65, health care providers may recommend pelvic exams and Pap testing every 3 years, or they may recommend the Pap and pelvic exam, combined with testing for human papilloma virus (HPV), every 5 years. Some types of HPV increase your risk of cervical cancer. Testing for HPV may also be done on women of any age with unclear Pap test results.  Other health care providers may not recommend any screening for nonpregnant women who are considered low risk for pelvic cancer and who do not have symptoms. Ask your health care provider if a screening pelvic exam is right for you.  If you have had past treatment for cervical cancer or a condition that could lead to cancer, you need Pap tests and screening for cancer for at least 20 years after your treatment. If Pap tests have been discontinued, your risk factors (such as having a new sexual partner) need to be reassessed to  determine if screening should resume. Some women have medical problems that increase the chance of getting cervical cancer. In these cases, your health care provider may recommend more frequent screening and Pap tests.  Colorectal Cancer  This type of cancer can be detected and often prevented.  Routine colorectal cancer screening usually begins at 68 years of age and continues through 68 years of age.  Your health care provider may recommend screening at an earlier age if you have risk factors for colon cancer.  Your health care provider may also recommend using home test kits to check for hidden blood in the stool.  A small camera at the end of a tube can be used to examine your colon directly (sigmoidoscopy or colonoscopy). This is done to check for the earliest forms of colorectal cancer.  Routine screening usually begins at age 74.  Direct examination of the colon should be repeated every 5-10 years through 68 years of age. However, you may need to be screened more often if early forms of precancerous polyps or  small growths are found.  Skin Cancer  Check your skin from head to toe regularly.  Tell your health care provider about any new moles or changes in moles, especially if there is a change in a mole's shape or color.  Also tell your health care provider if you have a mole that is larger than the size of a pencil eraser.  Always use sunscreen. Apply sunscreen liberally and repeatedly throughout the day.  Protect yourself by wearing long sleeves, pants, a wide-brimmed hat, and sunglasses whenever you are outside.  Heart disease, diabetes, and high blood pressure  High blood pressure causes heart disease and increases the risk of stroke. High blood pressure is more likely to develop in: ? People who have blood pressure in the high end of the normal range (130-139/85-89 mm Hg). ? People who are overweight or obese. ? People who are African American.  If you are 18-39 years  of age, have your blood pressure checked every 3-5 years. If you are 59 years of age or older, have your blood pressure checked every year. You should have your blood pressure measured twice-once when you are at a hospital or clinic, and once when you are not at a hospital or clinic. Record the average of the two measurements. To check your blood pressure when you are not at a hospital or clinic, you can use: ? An automated blood pressure machine at a pharmacy. ? A home blood pressure monitor.  If you are between 75 years and 73 years old, ask your health care provider if you should take aspirin to prevent strokes.  Have regular diabetes screenings. This involves taking a blood sample to check your fasting blood sugar level. ? If you are at a normal weight and have a low risk for diabetes, have this test once every three years after 68 years of age. ? If you are overweight and have a high risk for diabetes, consider being tested at a younger age or more often. Preventing infection Hepatitis B  If you have a higher risk for hepatitis B, you should be screened for this virus. You are considered at high risk for hepatitis B if: ? You were born in a country where hepatitis B is common. Ask your health care provider which countries are considered high risk. ? Your parents were born in a high-risk country, and you have not been immunized against hepatitis B (hepatitis B vaccine). ? You have HIV or AIDS. ? You use needles to inject street drugs. ? You live with someone who has hepatitis B. ? You have had sex with someone who has hepatitis B. ? You get hemodialysis treatment. ? You take certain medicines for conditions, including cancer, organ transplantation, and autoimmune conditions.  Hepatitis C  Blood testing is recommended for: ? Everyone born from [birth year] through 1966. ? Anyone with known risk factors for hepatitis C.  Sexually transmitted infections (STIs)  You should be screened for  sexually transmitted infections (STIs) including gonorrhea and chlamydia if: ? You are sexually active and are younger than 68 years of age. ? You are older than 68 years of age and your health care provider tells you that you are at risk for this type of infection. ? Your sexual activity has changed since you were last screened and you are at an increased risk for chlamydia or gonorrhea. Ask your health care provider if you are at risk.  If you do not have HIV, but are at risk, it  may be recommended that you take a prescription medicine daily to prevent HIV infection. This is called pre-exposure prophylaxis (PrEP). You are considered at risk if: ? You are sexually active and do not regularly use condoms or know the HIV status of your partner(s). ? You take drugs by injection. ? You are sexually active with a partner who has HIV.  Talk with your health care provider about whether you are at high risk of being infected with HIV. If you choose to begin PrEP, you should first be tested for HIV. You should then be tested every 3 months for as long as you are taking PrEP. Pregnancy  If you are premenopausal and you may become pregnant, ask your health care provider about preconception counseling.  If you may become pregnant, take 400 to 800 micrograms (mcg) of folic acid every day.  If you want to prevent pregnancy, talk to your health care provider about birth control (contraception). Osteoporosis and menopause  Osteoporosis is a disease in which the bones lose minerals and strength with aging. This can result in serious bone fractures. Your risk for osteoporosis can be identified using a bone density scan.  If you are 85 years of age or older, or if you are at risk for osteoporosis and fractures, ask your health care provider if you should be screened.  Ask your health care provider whether you should take a calcium or vitamin D supplement to lower your risk for osteoporosis.  Menopause may  have certain physical symptoms and risks.  Hormone replacement therapy may reduce some of these symptoms and risks. Talk to your health care provider about whether hormone replacement therapy is right for you. Follow these instructions at home:  Schedule regular health, dental, and eye exams.  Stay current with your immunizations.  Do not use any tobacco products including cigarettes, chewing tobacco, or electronic cigarettes.  If you are pregnant, do not drink alcohol.  If you are breastfeeding, limit how much and how often you drink alcohol.  Limit alcohol intake to no more than 1 drink per day for nonpregnant women. One drink equals 12 ounces of beer, 5 ounces of wine, or 1 ounces of hard liquor.  Do not use street drugs.  Do not share needles.  Ask your health care provider for help if you need support or information about quitting drugs.  Tell your health care provider if you often feel depressed.  Tell your health care provider if you have ever been abused or do not feel safe at home. This information is not intended to replace advice given to you by your health care provider. Make sure you discuss any questions you have with your health care provider. Document Released: 07/25/2010 Document Revised: 06/17/2015 Document Reviewed: 10/13/2014 Elsevier Interactive Patient Education  [employer].

## 2017-03-06 NOTE — Therapy (Signed)
[facility] [address] [place], [place], [address] Phone: [phone]   Fax:  [phone]  Physical Therapy Treatment  Patient Details  Name: Tina Lucas MRN: [id number] Date of Birth: 21-Apr-1950 Referring Provider: Binnie Rail, MD   Encounter Date: 03/06/2017  PT End of Session - 03/06/17 1751    Visit Number  4    Date for PT Re-Evaluation  04/18/17    Authorization Type  medicare    PT Start Time  1618    PT Stop Time  1706    PT Time Calculation (min)  48 min    Activity Tolerance  Treatment limited secondary to medical complications (Comment)    Behavior During Therapy  [facility] for tasks assessed/performed       Past Medical History:  Diagnosis Date  . Allergic rhinitis   . History of asthma 1956-2000  . Hypothyroid 2013  . Primary HSV infection of mouth 2008  . 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 TONSILLECTOMY  1958    There were no vitals filed for this visit.  Subjective Assessment - 03/06/17 1634    Subjective  I didn't feel as good as I did after the last session. Pt only did exercises 1x and was nervous to do them any more.    Currently in Pain?  No/denies                      [facility] Adult PT Treatment/Exercise - 03/06/17 0001      Self-Care   Other Self-Care Comments   reviewed HEP and bed mobility      Neuro Re-ed    Neuro Re-ed Details   full body rotation with fixed gazed in seated      Manual Therapy   Myofascial Release  cervical and cranial MFR, suboccipitals, cervical paraspinals    Manual Traction  cervical traction               PT Short Term Goals - 03/01/17 1629      PT SHORT TERM GOAL #1   Title  pt ind with initial HEP    Baseline  still working on learning    Time  4    Period  Weeks    Status  On-going      PT SHORT TERM GOAL  #2   Title  pt will be able to perform bed mobility without using hands to support head    Time  4    Period  Weeks    Status  On-going        PT Long Term Goals - 02/21/17 1305      PT LONG TERM GOAL #1   Title  pt will be ind with advanced HEP    Time  8    Period  Weeks    Status  New    Target Date  04/18/17      PT LONG TERM GOAL #2   Title  pt will be able to return to pickle ball without experiencing onset of neck pain/heaviness after playing    Time  8    Period  Weeks    Status  New    Target Date  04/18/17      PT LONG TERM GOAL #3   Title  FOTO < or = to 34% limited    Time  8    Period  Weeks    Status  New    Target Date  04/18/17      PT LONG TERM GOAL #4   Title  pt demonstrates improved cervical rotation to 65 deg bilaterally for improved ability to navigate traffic while driving    Period  Weeks    Status  New    Target Date  04/18/17            Plan - 03/06/17 1726    Clinical Impression Statement  Pt states she felt better after manual techniques today.  Pt was able to perform exercise today without increased dizziness.  Pt performed modified cervical rotation with rotation of body while keeping head straight.  Pt will benefit from skilled PT to work on cervical ROM and manual to decrease muscle spasms.    PT Treatment/Interventions  ADLs/Self Care Home Management;Biofeedback;Cryotherapy;Electrical Stimulation;Moist Heat;Traction;Ultrasound;Gait training;Stair training;Functional mobility training;Therapeutic activities;Therapeutic exercise;Balance training;Neuromuscular re-education;Patient/family education;Manual techniques;Passive range of motion;Dry needling;Taping;Vestibular;Canalith Repostioning    PT Next Visit Plan  cervical traction, progress exercises as tolerated, cervical rotation and flex/ext with gaze fixed or on moving target    Consulted and Agree with Plan of Care  Patient       Patient will benefit from skilled therapeutic  intervention in order to improve the following deficits and impairments:  Pain, Postural dysfunction, Dizziness, Increased muscle spasms  Visit Diagnosis: Other muscle spasm  Cervicalgia     Problem List Patient Active Problem List   Diagnosis Date Noted  . Chest pain 04/20/2016  . Essential hypertension 04/20/2016  . GERD (gastroesophageal reflux disease) 04/20/2016  . Numbness 08/06/2015  . Ecchymoses, spontaneous 06/25/2015  . Osteopenia 12/10/2014  . Allergic urticaria 12/10/2014  . Pressure in head 04/01/2014  . Dizziness 02/10/2014  . Elevated blood pressure reading 11/06/2013  . Hypothyroid   . Primary HSV infection of mouth     Zannie Cove, PT 03/06/2017, 5:51 PM  [facility] [address], [place], [place], [address] Phone: [phone]   Fax:  [phone]  Name: Tina Lucas MRN: [id number] Date of Birth: 05-13-1950

## 2017-03-06 NOTE — Patient Instructions (Addendum)
Sit in a chair that turns.  Keep your head and eyes straight ahead and turn the chair so your body is turning side to side - 10x each side   Trigger Point Dry Needling  . What is Trigger Point Dry Needling (DN)? o DN is a physical therapy technique used to treat muscle pain and dysfunction. Specifically, DN helps deactivate muscle trigger points (muscle knots).  o A thin filiform needle is used to penetrate the skin and stimulate the underlying trigger point. The goal is for a local twitch response (LTR) to occur and for the trigger point to relax. No medication of any kind is injected during the procedure.   . What Does Trigger Point Dry Needling Feel Like?  o The procedure feels different for each individual patient. Some patients report that they do not actually feel the needle enter the skin and overall the process is not painful. Very mild bleeding may occur. However, many patients feel a deep cramping in the muscle in which the needle was inserted. This is the local twitch response.   Marland Kitchen How Will I feel after the treatment? o Soreness is normal, and the onset of soreness may not occur for a few hours. Typically this soreness does not last longer than two days.  o Bruising is uncommon, however; ice can be used to decrease any possible bruising.  o In rare cases feeling tired or nauseous after the treatment is normal. In addition, your symptoms may get worse before they get better, this period will typically not last longer than 24 hours.   . What Can I do After My Treatment? o Increase your hydration by drinking more water for the next 24 hours. o You may place ice or heat on the areas treated that have become sore, however, do not use heat on inflamed or bruised areas. Heat often brings more relief post needling. o You can continue your regular activities, but vigorous activity is not recommended initially after the treatment for 24 hours. o DN is best combined with other physical therapy  such as strengthening, stretching, and other therapies.    [facility] [address], [place], [place] [address] Phone # [phone] Fax [phone]    [facility] [address], [place], [place] [address] Phone # [phone] Fax [phone]

## 2017-03-06 NOTE — Progress Notes (Signed)
Subjective:    Patient ID: Tina Lucas, female    DOB: [birth year], 67 y.o.   MRN: [id number]  HPI She is here for a physical exam.   She is doing PT for her neck pain and vertigo.  There is some improvement, but she still has some symptoms.    Her grandson is sick.   Yesterday she started to have some congestion and very mild cough.    BP when she monitors it is good - 126/73, 107/71, 127/76, 111/67, 116/74, 130/76, 131/76, 126/71.    Medications and allergies reviewed with patient and updated if appropriate.  Patient Active Problem List   Diagnosis Date Noted  . Hyperglycemia 03/06/2017  . Chest pain 04/20/2016  . GERD (gastroesophageal reflux disease) 04/20/2016  . Numbness 08/06/2015  . Osteopenia 12/10/2014  . Allergic urticaria 12/10/2014  . Pressure in head 04/01/2014  . Dizziness 02/10/2014  . Elevated blood pressure reading 11/06/2013  . Hypothyroid   . Primary HSV infection of mouth     Current Outpatient Medications on File Prior to Visit  Medication Sig Dispense Refill  . Calcium Carbonate-Vitamin D (CALTRATE 600+D) 600-400 MG-UNIT per tablet Take 1 tablet by mouth daily.    . celecoxib (CELEBREX) 100 MG capsule Take 100 mg by mouth daily.    . cholecalciferol (VITAMIN D) 400 UNITS TABS tablet Take 400 Units by mouth.    . fexofenadine (ALLEGRA) 180 MG tablet Take 180 mg by mouth daily.    . fluocinonide cream (LIDEX) 8.24 % Apply 1 application topically 2 (two) times daily as needed (FOR PSORIASIS).     . Magnesium 250 MG TABS Take 125 mg by mouth daily.    . Multiple Vitamins-Minerals (ONE-A-DAY WOMENS 50+ ADVANTAGE PO) Take by mouth daily.    . Probiotic Product (PROBIOTIC DAILY) CAPS Take 1 capsule by mouth daily.     No current facility-administered medications on file prior to visit.     Past Medical History:  Diagnosis Date  . Allergic rhinitis   . History of asthma 1956-2000  . Hypothyroid 2013  . Primary HSV infection of mouth 2008  .  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 TONSILLECTOMY  1958    Social History   Socioeconomic History  . Marital status: Married    Spouse name: None  . Number of children: None  . Years of education: None  . Highest education level: None  Social Needs  . Financial resource strain: None  . Food insecurity - worry: None  . Food insecurity - inability: None  . Transportation needs - medical: None  . Transportation needs - non-medical: None  Occupational History  . None  Tobacco Use  . Smoking status: Never Smoker  . Smokeless tobacco: Never Used  Substance and Sexual Activity  . Alcohol use: Yes    Alcohol/week: 0.0 oz  . Drug use: No  . Sexual activity: None  Other Topics Concern  . None  Social History Narrative   Lives with husband in a one story home.  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    Family History  Problem Relation Age of Onset  . Breast cancer Mother 16  . Osteoarthritis Father   . Hypertension Father   . Leukemia Maternal Aunt 32  . Atrial fibrillation Brother     Review of Systems  Constitutional: Negative for chills and fever.  HENT: Positive for postnasal drip.   Eyes: Negative for visual disturbance.  Respiratory: Positive for cough (minimal). Negative for shortness of breath and wheezing.   Cardiovascular: Negative for chest pain, palpitations and leg swelling.  Gastrointestinal: Negative for abdominal pain, blood in stool, constipation, diarrhea and nausea.  Genitourinary: Negative for dysuria and hematuria.  Musculoskeletal: Positive for arthralgias (arthritis), neck pain and neck stiffness.  Skin: Negative for color change and rash.  Neurological: Positive for dizziness (intermittent). Negative for headaches.  Psychiatric/Behavioral: Negative for dysphoric mood. The patient is not nervous/anxious.        Objective:    Vitals:   03/07/17 0822  BP: (!) 152/88  Pulse: 66  Resp: 16  Temp: 98.5 F (36.9 C)  SpO2: 99%   Filed Weights   03/07/17 0822  Weight: 125 lb (56.7 kg)   Body mass index is 19.01 kg/m.  Wt Readings from Last 3 Encounters:  03/07/17 125 lb (56.7 kg)  02/14/17 126 lb (57.2 kg)  04/27/16 127 lb 1.9 oz (57.7 kg)     Physical Exam Constitutional: She appears well-developed and well-nourished. No distress.  HENT:  Head: Normocephalic and atraumatic.  Right Ear: External ear normal. Normal ear canal and TM Left Ear: External ear normal.  Normal ear canal and TM Mouth/Throat: Oropharynx is clear and moist.  Eyes: Conjunctivae and EOM are normal.  Neck: Neck supple. No tracheal deviation present. No thyromegaly present.  No carotid bruit  Cardiovascular: Normal rate, regular rhythm and normal heart sounds.   No murmur heard.  No edema. Pulmonary/Chest: Effort normal and breath sounds normal. No respiratory distress. She has no wheezes. She has no rales.  Breast: deferred Abdominal: Soft. She exhibits no distension. There is no tenderness.  Lymphadenopathy: She has no cervical adenopathy.  Skin: Skin is warm and dry. She is not diaphoretic.  Psychiatric: She has a normal mood and affect. Her behavior is normal.        Assessment & Plan:   Physical exam: Screening blood work ordered Immunizations   shingles vaccine, Pneumovax and tetanus discussed Colonoscopy   -Colo guard done 2016-repeat this year Mammogram up-to-date    Gyn -  No longer sees gyn Dexa   she deferred - will think about it Eye exams    Up to date  Exercise   Regular - gym, pickle ball   Weight    Normal  BMI Skin   No concerns  - sees derm annually Substance abuse   none  See Problem List for Assessment and Plan of chronic medical problems.   Follow-up annually

## 2017-03-07 ENCOUNTER — Encounter: Payer: Self-pay | Admitting: Internal Medicine

## 2017-03-07 ENCOUNTER — Ambulatory Visit: Payer: Medicare Other | Admitting: Physical Therapy

## 2017-03-07 ENCOUNTER — Other Ambulatory Visit (INDEPENDENT_AMBULATORY_CARE_PROVIDER_SITE_OTHER): Payer: Medicare Other

## 2017-03-07 ENCOUNTER — Ambulatory Visit (INDEPENDENT_AMBULATORY_CARE_PROVIDER_SITE_OTHER): Payer: Medicare Other | Admitting: Internal Medicine

## 2017-03-07 ENCOUNTER — Encounter: Payer: Medicare Other | Admitting: Physical Therapy

## 2017-03-07 VITALS — BP 152/88 | HR 66 | Temp 98.5°F | Resp 16 | Ht 68.0 in | Wt 125.0 lb

## 2017-03-07 DIAGNOSIS — R42 Dizziness and giddiness: Secondary | ICD-10-CM

## 2017-03-07 DIAGNOSIS — E039 Hypothyroidism, unspecified: Secondary | ICD-10-CM

## 2017-03-07 DIAGNOSIS — M8580 Other specified disorders of bone density and structure, unspecified site: Secondary | ICD-10-CM

## 2017-03-07 DIAGNOSIS — Z0000 Encounter for general adult medical examination without abnormal findings: Secondary | ICD-10-CM

## 2017-03-07 DIAGNOSIS — R030 Elevated blood-pressure reading, without diagnosis of hypertension: Secondary | ICD-10-CM

## 2017-03-07 DIAGNOSIS — R739 Hyperglycemia, unspecified: Secondary | ICD-10-CM

## 2017-03-07 LAB — COMPREHENSIVE METABOLIC PANEL
ALT: 14 U/L (ref 0–35)
AST: 17 U/L (ref 0–37)
Albumin: 4.6 g/dL (ref 3.5–5.2)
Alkaline Phosphatase: 58 U/L (ref 39–117)
BUN: 11 mg/dL (ref 6–23)
CO2: 31 mEq/L (ref 19–32)
Calcium: 9.5 mg/dL (ref 8.4–10.5)
Chloride: 101 mEq/L (ref 96–112)
Creatinine, Ser: 0.7 mg/dL (ref 0.40–1.20)
GFR: 88.54 mL/min (ref >60.00)
Glucose, Bld: 98 mg/dL (ref 70–99)
Potassium: 4.6 mEq/L (ref 3.5–5.1)
Sodium: 140 mEq/L (ref 135–145)
Total Bilirubin: 1 mg/dL (ref 0.2–1.2)
Total Protein: 7.1 g/dL (ref 6.0–8.3)

## 2017-03-07 LAB — CBC WITH DIFFERENTIAL/PLATELET
Basophils Absolute: 0.1 10*3/uL (ref 0.0–0.1)
Basophils Relative: 0.9 % (ref 0.0–3.0)
Eosinophils Absolute: 0.5 10*3/uL (ref 0.0–0.7)
Eosinophils Relative: 8.4 % — ABNORMAL HIGH (ref 0.0–5.0)
HCT: 44.3 % (ref 36.0–46.0)
Hemoglobin: 15.2 g/dL — ABNORMAL HIGH (ref 12.0–15.0)
Lymphocytes Relative: 14.9 % (ref 12.0–46.0)
Lymphs Abs: 0.9 10*3/uL (ref 0.7–4.0)
MCHC: 34.3 g/dL (ref 30.0–36.0)
MCV: 91.9 fl (ref 78.0–100.0)
Monocytes Absolute: 0.8 10*3/uL (ref 0.1–1.0)
Monocytes Relative: 13.2 % — ABNORMAL HIGH (ref 3.0–12.0)
Neutro Abs: 3.6 10*3/uL (ref 1.4–7.7)
Neutrophils Relative %: 62.6 % (ref 43.0–77.0)
Platelets: 228 10*3/uL (ref 150.0–400.0)
RBC: 4.81 Mil/uL (ref 3.87–5.11)
RDW: 12.9 % (ref 11.5–15.5)
WBC: 5.8 10*3/uL (ref 4.0–10.5)

## 2017-03-07 LAB — LIPID PANEL
Cholesterol: 195 mg/dL (ref 0–200)
HDL: 79 mg/dL (ref >39.00)
LDL Cholesterol: 98 mg/dL (ref 0–99)
NonHDL: 115.81
Total CHOL/HDL Ratio: 2
Triglycerides: 89 mg/dL (ref 0.0–149.0)
VLDL: 17.8 mg/dL (ref 0.0–40.0)

## 2017-03-07 LAB — VITAMIN D 25 HYDROXY (VIT D DEFICIENCY, FRACTURES): VITD: 54.68 ng/mL (ref 30.00–100.00)

## 2017-03-07 LAB — HEMOGLOBIN A1C: Hgb A1c MFr Bld: 5.1 % (ref 4.6–6.5)

## 2017-03-07 LAB — TSH: TSH: 2.63 u[IU]/mL (ref 0.35–4.50)

## 2017-03-07 MED ORDER — LEVOTHYROXINE SODIUM 50 MCG PO TABS: 50.0000 ug | ORAL_TABLET | Freq: Every day | ORAL | 3 refills | Status: DC

## 2017-03-07 NOTE — Assessment & Plan Note (Signed)
Improved with PT Will continue PT Combo of BPPV and cervical vertigo

## 2017-03-07 NOTE — Assessment & Plan Note (Addendum)
a1c

## 2017-03-07 NOTE — Assessment & Plan Note (Signed)
Check vitamin d level Exercising regularly Taking vitamin d, calcium Thinking about a dexa

## 2017-03-07 NOTE — Assessment & Plan Note (Signed)
BP well controled at home White coat htn She will continue to monitor at home/gym No need for medication

## 2017-03-08 ENCOUNTER — Encounter: Payer: Self-pay | Admitting: Physical Therapy

## 2017-03-08 ENCOUNTER — Ambulatory Visit: Payer: Medicare Other | Admitting: Physical Therapy

## 2017-03-08 DIAGNOSIS — M542 Cervicalgia: Secondary | ICD-10-CM

## 2017-03-08 DIAGNOSIS — M62838 Other muscle spasm: Secondary | ICD-10-CM

## 2017-03-08 NOTE — Therapy (Signed)
[facility] [address], [place], [place], [address] Phone: [phone]   Fax:  [phone]  Physical Therapy Treatment  Patient Details  Name: Tina Lucas MRN: [id number] Date of Birth: 06-08-1950 Referring Provider: Binnie Rail, MD   Encounter Date: 03/08/2017  PT End of Session - 03/08/17 1528    Visit Number  5    Date for PT Re-Evaluation  04/18/17    Authorization Type  medicare    PT Start Time  1528    PT Stop Time  1617    PT Time Calculation (min)  49 min    Activity Tolerance  Treatment limited secondary to medical complications (Comment)    Behavior During Therapy  [facility] for tasks assessed/performed       Past Medical History:  Diagnosis Date  . Allergic rhinitis   . History of asthma 1956-2000  . Hypothyroid 2013  . Primary HSV infection of mouth 2008  . 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 TONSILLECTOMY  1958    There were no vitals filed for this visit.  Subjective Assessment - 03/08/17 1531    Subjective  I played pickleball and felt good playing.  I did the exercises yesterday afternoon and it bothered the back of my head 10 minutes after doing them.  Today I felt less dizzy.    Currently in Pain?  No/denies                      [facility] Adult PT Treatment/Exercise - 03/08/17 0001      Neuro Re-ed    Neuro Re-ed Details   full body rotation with fixed gazed in seated; gaze forward only turning head side to side and up and down - 10x each tactile and verbal cues      Neck Exercises: Theraband   Shoulder External Rotation  20 reps;Red      Modalities   Modalities  Traction      Traction   Type of Traction  Cervical    Min (lbs)  5    Max (lbs)  12    Hold Time  60    Rest Time  10    Time  15      Manual Therapy   Myofascial Release  cervical and  cranial MFR, suboccipitals, cervical paraspinals    Manual Traction  cervical traction             PT Education - 03/08/17 1721    Education provided  Yes    Education Details  bilateral ER    Person(s) Educated  Patient    Methods  Explanation;Demonstration;Handout;Verbal cues;Tactile cues    Comprehension  Verbalized understanding;Returned demonstration       PT Short Term Goals - 03/01/17 1629      PT SHORT TERM GOAL #1   Title  pt ind with initial HEP    Baseline  still working on learning    Time  4    Period  Weeks    Status  On-going      PT SHORT TERM GOAL #2   Title  pt will be able to perform bed mobility without using hands to support head    Time  4    Period  Weeks    Status  On-going        PT Long Term Goals -  02/21/17 1305      PT LONG TERM GOAL #1   Title  pt will be ind with advanced HEP    Time  8    Period  Weeks    Status  New    Target Date  04/18/17      PT LONG TERM GOAL #2   Title  pt will be able to return to pickle ball without experiencing onset of neck pain/heaviness after playing    Time  8    Period  Weeks    Status  New    Target Date  04/18/17      PT LONG TERM GOAL #3   Title  FOTO < or = to 34% limited    Time  8    Period  Weeks    Status  New    Target Date  04/18/17      PT LONG TERM GOAL #4   Title  pt demonstrates improved cervical rotation to 65 deg bilaterally for improved ability to navigate traffic while driving    Period  Weeks    Status  New    Target Date  04/18/17            Plan - 03/08/17 1528    Clinical Impression Statement  Patient was dizzy getting up from supine today.  She was able to demonstrate HEP but needed some corrections on technique to make sure head position was correct and that she was engaging the right muscles.  Pt continues to need skilled PT to work on improved cervical ROM and vertigo symptoms.    PT Treatment/Interventions  ADLs/Self Care Home  Management;Biofeedback;Cryotherapy;Electrical Stimulation;Moist Heat;Traction;Ultrasound;Gait training;Stair training;Functional mobility training;Therapeutic activities;Therapeutic exercise;Balance training;Neuromuscular re-education;Patient/family education;Manual techniques;Passive range of motion;Dry needling;Taping;Vestibular;Canalith Repostioning    PT Next Visit Plan  cervical traction, progress exercises as tolerated, cervical rotation and flex/ext with gaze fixed or on moving target    Consulted and Agree with Plan of Care  Patient       Patient will benefit from skilled therapeutic intervention in order to improve the following deficits and impairments:  Pain, Postural dysfunction, Dizziness, Increased muscle spasms  Visit Diagnosis: Other muscle spasm  Cervicalgia     Problem List Patient Active Problem List   Diagnosis Date Noted  . Hyperglycemia 03/06/2017  . GERD (gastroesophageal reflux disease) 04/20/2016  . Numbness 08/06/2015  . Osteopenia 12/10/2014  . Allergic urticaria 12/10/2014  . Pressure in head 04/01/2014  . Dizziness 02/10/2014  . Elevated blood pressure reading 11/06/2013  . Hypothyroid   . Primary HSV infection of mouth     Zannie Cove, PT 03/08/2017, 5:27 PM  [facility] [address] [place], [place], [address] Phone: [phone]   Fax:  [phone]  Name: Tina Lucas MRN: [id number] Date of Birth: Sep 20, 1950

## 2017-03-08 NOTE — Patient Instructions (Signed)
   Bilateral Shoulder External Rotation  Standing or sitting with upright posture, hold the middle of a piece of theraband with both hands. With your elbows at your side and bent at 90 degree angle, pull your hands outward and hold for 3 seconds. You should feel you shoulders roll backward and your chest move forward.  Repeat 20x  [facility] [address] [place], [place] [address] Phone # [phone] Fax [phone]

## 2017-03-09 ENCOUNTER — Encounter: Payer: Medicare Other | Admitting: Physical Therapy

## 2017-03-13 ENCOUNTER — Ambulatory Visit: Payer: Medicare Other | Admitting: Physical Therapy

## 2017-03-13 ENCOUNTER — Encounter: Payer: Self-pay | Admitting: Physical Therapy

## 2017-03-13 DIAGNOSIS — M542 Cervicalgia: Secondary | ICD-10-CM

## 2017-03-13 DIAGNOSIS — M62838 Other muscle spasm: Secondary | ICD-10-CM

## 2017-03-13 NOTE — Therapy (Signed)
[facility] [address] [place], [place], [address] Phone: [phone]   Fax:  [phone]  Physical Therapy Treatment  Patient Details  Name: Tina Lucas MRN: [id number] Date of Birth: 1950/09/24 Referring Provider: Binnie Rail, MD   Encounter Date: 03/13/2017  PT End of Session - 03/13/17 1230    Visit Number  6    Date for PT Re-Evaluation  04/18/17    Authorization Type  medicare    PT Start Time  1230    PT Stop Time  1315    PT Time Calculation (min)  45 min    Activity Tolerance  Treatment limited secondary to medical complications (Comment)    Behavior During Therapy  [facility] for tasks assessed/performed       Past Medical History:  Diagnosis Date  . Allergic rhinitis   . History of asthma 1956-2000  . Hypothyroid 2013  . Primary HSV infection of mouth 2008  . 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 TONSILLECTOMY  1958    There were no vitals filed for this visit.  Subjective Assessment - 03/13/17 1318    Subjective  I played pickel ball and felt really good today.  I believe the exercises are helping but head nods in supine are bothering me and when I get my hair done it does something to me and that is the worst.    Currently in Pain?  No/denies                      [facility] Adult PT Treatment/Exercise - 03/13/17 0001      Neuro Re-ed    Neuro Re-ed Details   reviewed and performed exercises in HEP; added trunk flexion with head and gaze fixed forward 10reps; educated on BBQ maneuver for reduced vertigo symptoms in extension      Traction   Type of Traction  Cervical    Min (lbs)  5    Max (lbs)  13    Hold Time  60    Rest Time  10    Time  15             PT Education - 03/13/17 1308    Education provided  Yes    Education Details  BBQ maneuver, forward  flexion and cervical extension    Person(s) Educated  Patient    Methods  Explanation;Demonstration;Handout;Verbal cues    Comprehension  Verbalized understanding;Returned demonstration       PT Short Term Goals - 03/13/17 1313      PT SHORT TERM GOAL #1   Title  pt ind with initial HEP    Time  4    Period  Weeks    Status  Achieved      PT SHORT TERM GOAL #2   Title  pt will be able to perform bed mobility without using hands to support head    Time  4    Period  Weeks    Status  Achieved        PT Long Term Goals - 02/21/17 1305      PT LONG TERM GOAL #1   Title  pt will be ind with advanced HEP    Time  8    Period  Weeks    Status  New    Target Date  04/18/17  PT LONG TERM GOAL #2   Title  pt will be able to return to pickle ball without experiencing onset of neck pain/heaviness after playing    Time  8    Period  Weeks    Status  New    Target Date  04/18/17      PT LONG TERM GOAL #3   Title  FOTO < or = to 34% limited    Time  8    Period  Weeks    Status  New    Target Date  04/18/17      PT LONG TERM GOAL #4   Title  pt demonstrates improved cervical rotation to 65 deg bilaterally for improved ability to navigate traffic while driving    Period  Weeks    Status  New    Target Date  04/18/17            Plan - 03/13/17 1311    Clinical Impression Statement  Patient is responding well to HEP and has achieved her short term goals at this time.  She was able to understand additions to exercises and was educated on how to perform BBQ maneuver for relief with symptoms when doing cervical extension.      PT Treatment/Interventions  ADLs/Self Care Home Management;Biofeedback;Cryotherapy;Electrical Stimulation;Moist Heat;Traction;Ultrasound;Gait training;Stair training;Functional mobility training;Therapeutic activities;Therapeutic exercise;Balance training;Neuromuscular re-education;Patient/family education;Manual techniques;Passive range of  motion;Dry needling;Taping;Vestibular;Canalith Repostioning    PT Next Visit Plan  f/u on cervical traction, progress exercises as tolerated, cervical rotation and flex/ext with gaze fixed or on moving target    Consulted and Agree with Plan of Care  Patient       Patient will benefit from skilled therapeutic intervention in order to improve the following deficits and impairments:  Pain, Postural dysfunction, Dizziness, Increased muscle spasms  Visit Diagnosis: Other muscle spasm  Cervicalgia     Problem List Patient Active Problem List   Diagnosis Date Noted  . Hyperglycemia 03/06/2017  . GERD (gastroesophageal reflux disease) 04/20/2016  . Numbness 08/06/2015  . Osteopenia 12/10/2014  . Allergic urticaria 12/10/2014  . Pressure in head 04/01/2014  . Dizziness 02/10/2014  . Elevated blood pressure reading 11/06/2013  . Hypothyroid   . Primary HSV infection of mouth     Zannie Cove, PT 03/13/2017, 1:20 PM  [facility] [address] [place], [place], [address] Phone: [phone]   Fax:  [phone]  Name: Tina Lucas MRN: [id number] Date of Birth: 1950/12/29

## 2017-03-13 NOTE — Patient Instructions (Signed)
   [place] BBQ maneuver   Trunk flexion    Keep head forward, bend from the waist, keeping head forward the entire time 10 reps every day  [facility] [address], [place], [place] [address] Phone # [phone] Fax [phone]

## 2017-03-15 ENCOUNTER — Ambulatory Visit: Payer: Medicare Other | Admitting: Physical Therapy

## 2017-03-15 ENCOUNTER — Encounter: Payer: Self-pay | Admitting: Physical Therapy

## 2017-03-15 DIAGNOSIS — M62838 Other muscle spasm: Secondary | ICD-10-CM | POA: Diagnosis not present

## 2017-03-15 DIAGNOSIS — M542 Cervicalgia: Secondary | ICD-10-CM

## 2017-03-15 NOTE — Therapy (Signed)
[facility] [address], [place], [place], [address] Phone: [phone]   Fax:  [phone]  Physical Therapy Treatment  Patient Details  Name: Tina Lucas MRN: [id number] Date of Birth: 17-Dec-1950 Referring Provider: Binnie Rail, MD   Encounter Date: 03/15/2017  PT End of Session - 03/15/17 1447    Visit Number  7    Date for PT Re-Evaluation  04/18/17    Authorization Type  medicare    PT Start Time  1444    PT Stop Time  1540    PT Time Calculation (min)  56 min    Activity Tolerance  Treatment limited secondary to medical complications (Comment)    Behavior During Therapy  [facility] for tasks assessed/performed       Past Medical History:  Diagnosis Date  . Allergic rhinitis   . History of asthma 1956-2000  . Hypothyroid 2013  . Primary HSV infection of mouth 2008  . 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 TONSILLECTOMY  1958    There were no vitals filed for this visit.  Subjective Assessment - 03/15/17 1717    Subjective  Still feeling dizzy when getting out of bed and going to the hair dresser is the worst.  The exercises are not bothering me.    Currently in Pain?  No/denies                      [facility] Adult PT Treatment/Exercise - 03/15/17 0001      Neuro Re-ed    Neuro Re-ed Details   performed balance and standing exercises o nfoam mat      Neck Exercises: Standing   Other Standing Exercises  turning trunk with head straight; turning head with gaze fixed, forward flexion head and eyes fixed - 10 each way      Traction   Type of Traction  Cervical    Min (lbs)  5    Max (lbs)  15    Hold Time  60    Rest Time  10    Time  15      Manual Therapy   Myofascial Release  cervical and cranial MFR, suboccipitals, cervical paraspinals    Manual Traction  cervical traction                PT Short Term Goals - 03/13/17 1313      PT SHORT TERM GOAL #1   Title  pt ind with initial HEP    Time  4    Period  Weeks    Status  Achieved      PT SHORT TERM GOAL #2   Title  pt will be able to perform bed mobility without using hands to support head    Time  4    Period  Weeks    Status  Achieved        PT Long Term Goals - 02/21/17 1305      PT LONG TERM GOAL #1   Title  pt will be ind with advanced HEP    Time  8    Period  Weeks    Status  New    Target Date  04/18/17      PT LONG TERM GOAL #2   Title  pt will be able to return to pickle ball without experiencing onset of neck pain/heaviness  after playing    Time  8    Period  Weeks    Status  New    Target Date  04/18/17      PT LONG TERM GOAL #3   Title  FOTO < or = to 34% limited    Time  8    Period  Weeks    Status  New    Target Date  04/18/17      PT LONG TERM GOAL #4   Title  pt demonstrates improved cervical rotation to 65 deg bilaterally for improved ability to navigate traffic while driving    Period  Weeks    Status  New    Target Date  04/18/17            Plan - 03/15/17 1718    Clinical Impression Statement  Patient is able to progress to standing on foam mat during exercises today.  She is still having symptoms with sitting up.  Patient is responding well to cervical traction and soft tissue techniques with reduced tightness in head.  Pt will benefit from skilled PT to progress balance and head movements with increased cervical ROM    PT Treatment/Interventions  ADLs/Self Care Home Management;Biofeedback;Cryotherapy;Electrical Stimulation;Moist Heat;Traction;Ultrasound;Gait training;Stair training;Functional mobility training;Therapeutic activities;Therapeutic exercise;Balance training;Neuromuscular re-education;Patient/family education;Manual techniques;Passive range of motion;Dry needling;Taping;Vestibular;Canalith Repostioning    PT Next Visit Plan  cervical  traction, progress exercises as tolerated, cervical rotation and flex/ext with gaze fixed    Recommended Other Services  orders signed    Consulted and Agree with Plan of Care  Patient       Patient will benefit from skilled therapeutic intervention in order to improve the following deficits and impairments:  Pain, Postural dysfunction, Dizziness, Increased muscle spasms  Visit Diagnosis: Cervicalgia  Other muscle spasm     Problem List Patient Active Problem List   Diagnosis Date Noted  . Hyperglycemia 03/06/2017  . GERD (gastroesophageal reflux disease) 04/20/2016  . Numbness 08/06/2015  . Osteopenia 12/10/2014  . Allergic urticaria 12/10/2014  . Pressure in head 04/01/2014  . Dizziness 02/10/2014  . Elevated blood pressure reading 11/06/2013  . Hypothyroid   . Primary HSV infection of mouth     Zannie Cove, PT 03/15/2017, 5:24 PM  [facility] [address], [place], [place], [address] Phone: [phone]   Fax:  [phone]  Name: Tina Lucas MRN: [id number] Date of Birth: August 29, 1950

## 2017-03-16 ENCOUNTER — Encounter: Payer: Self-pay | Admitting: Internal Medicine

## 2017-03-21 ENCOUNTER — Ambulatory Visit: Payer: Medicare Other | Admitting: Physical Therapy

## 2017-03-21 DIAGNOSIS — M62838 Other muscle spasm: Secondary | ICD-10-CM | POA: Diagnosis not present

## 2017-03-21 DIAGNOSIS — M542 Cervicalgia: Secondary | ICD-10-CM

## 2017-03-21 NOTE — Therapy (Signed)
[facility] [address] [place], [place], [address] Phone: [phone]   Fax:  [phone]  Physical Therapy Treatment  Patient Details  Name: Tina Lucas MRN: [id number] Date of Birth: 09-24-1950 Referring Provider: Binnie Rail, MD   Encounter Date: 03/21/2017  PT End of Session - 03/21/17 1232    Visit Number  8    Date for PT Re-Evaluation  04/18/17    Authorization Type  medicare    PT Start Time  1145    PT Stop Time  1240    PT Time Calculation (min)  55 min    Activity Tolerance  Treatment limited secondary to medical complications (Comment)    Behavior During Therapy  [facility] for tasks assessed/performed       Past Medical History:  Diagnosis Date  . Allergic rhinitis   . History of asthma 1956-2000  . Hypothyroid 2013  . Primary HSV infection of mouth 2008  . 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 TONSILLECTOMY  1958    There were no vitals filed for this visit.  Subjective Assessment - 03/21/17 1223    Subjective  Pt reports she felt better after doing the BBQ maneuver.  She did it a second time after doing housework and then states it didn't feel as good.  She states going to the hair dresser wasn't as bad    Currently in Pain?  No/denies                      [facility] Adult PT Treatment/Exercise - 03/21/17 0001      Self-Care   Other Self-Care Comments   reviewed HEP and adjusted; review BBQ maneuver      Traction   Type of Traction  Cervical    Min (lbs)  5    Max (lbs)  16    Hold Time  60    Rest Time  10    Time  15      Manual Therapy   Manual Therapy  Soft tissue mobilization    Soft tissue mobilization  right rhomboids and upper trap trigger point release    Myofascial Release  cervical and cranial MFR, suboccipitals, cervical paraspinals    Manual  Traction  cervical traction              PT Education - 03/21/17 1231    Education provided  Yes    Education Details  BBQ instructions on you tube    Person(s) Educated  Patient    Methods  Explanation;Handout    Comprehension  Verbalized understanding       PT Short Term Goals - 03/13/17 1313      PT SHORT TERM GOAL #1   Title  pt ind with initial HEP    Time  4    Period  Weeks    Status  Achieved      PT SHORT TERM GOAL #2   Title  pt will be able to perform bed mobility without using hands to support head    Time  4    Period  Weeks    Status  Achieved        PT Long Term Goals - 03/21/17 1351      PT LONG TERM GOAL #1   Title  pt will be ind with advanced HEP    Time  8    Period  Weeks    Status  On-going      PT LONG TERM GOAL #2   Title  pt will be able to return to pickle ball without experiencing onset of neck pain/heaviness after playing    Time  8    Period  Weeks    Status  On-going      PT LONG TERM GOAL #3   Title  FOTO < or = to 34% limited    Time  8    Period  Weeks    Status  On-going      PT LONG TERM GOAL #4   Title  pt demonstrates improved cervical rotation to 65 deg bilaterally for improved ability to navigate traffic while driving    Time  8    Period  Weeks    Status  On-going            Plan - 03/21/17 1348    Clinical Impression Statement  Patient needed more direction with the BBQ maneuver, but was able to understand how to perform correctly and was given more directions to look at for further descriptions.  Pt was able to add increased pull on traction.  She continues to have some dizziness when sitting up from lying down.  She will benefit from skilled PT to continue to progress cervical motion for return to full function.    PT Treatment/Interventions  ADLs/Self Care Home Management;Biofeedback;Cryotherapy;Electrical Stimulation;Moist Heat;Traction;Ultrasound;Gait training;Stair training;Functional mobility  training;Therapeutic activities;Therapeutic exercise;Balance training;Neuromuscular re-education;Patient/family education;Manual techniques;Passive range of motion;Dry needling;Taping;Vestibular;Canalith Repostioning    PT Next Visit Plan  cervical traction, progress exercises as tolerated, cervical rotation and flex/ext with gaze fixed    Consulted and Agree with Plan of Care  Patient       Patient will benefit from skilled therapeutic intervention in order to improve the following deficits and impairments:  Pain, Postural dysfunction, Dizziness, Increased muscle spasms  Visit Diagnosis: Cervicalgia  Other muscle spasm     Problem List Patient Active Problem List   Diagnosis Date Noted  . Hyperglycemia 03/06/2017  . GERD (gastroesophageal reflux disease) 04/20/2016  . Numbness 08/06/2015  . Osteopenia 12/10/2014  . Allergic urticaria 12/10/2014  . Pressure in head 04/01/2014  . Dizziness 02/10/2014  . Elevated blood pressure reading 11/06/2013  . Hypothyroid   . Primary HSV infection of mouth     Zannie Cove, PT 03/21/2017, 1:52 PM  [facility] [address], [place], [place], [address] Phone: [phone]   Fax:  [phone]  Name: Tina Lucas MRN: [id number] Date of Birth: 03-06-1950

## 2017-03-21 NOTE — Patient Instructions (Signed)
   You can use these instructions on [facility] [address], [place], [place] [address] Phone # [phone] Fax [phone]

## 2017-03-28 ENCOUNTER — Ambulatory Visit: Payer: Medicare Other | Attending: Internal Medicine | Admitting: Physical Therapy

## 2017-03-28 DIAGNOSIS — M542 Cervicalgia: Secondary | ICD-10-CM | POA: Insufficient documentation

## 2017-03-28 DIAGNOSIS — M62838 Other muscle spasm: Secondary | ICD-10-CM | POA: Diagnosis present

## 2017-03-28 NOTE — Therapy (Signed)
[facility] [address], [place], [place], [address] Phone: [phone]   Fax:  [phone]  Physical Therapy Treatment  Patient Details  Name: Tina Lucas MRN: [id number] Date of Birth: [birth year] Referring Provider: Binnie Rail, MD   Encounter Date: 03/28/2017  PT End of Session - 03/28/17 1238    Visit Number  9    Date for PT Re-Evaluation  04/18/17    Authorization Type  medicare    PT Start Time  1150    PT Stop Time  1243    PT Time Calculation (min)  53 min    Activity Tolerance  Treatment limited secondary to medical complications (Comment)    Behavior During Therapy  [facility] for tasks assessed/performed       Past Medical History:  Diagnosis Date  . Allergic rhinitis   . History of asthma 1956-2000  . Hypothyroid 2013  . Primary HSV infection of mouth 2008  . 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 TONSILLECTOMY  1958    There were no vitals filed for this visit.  Subjective Assessment - 03/28/17 1237    Subjective  Pt states she has been better overall.  States she was dizzy after doing the exercises today.    Currently in Pain?  No/denies                      [facility] Adult PT Treatment/Exercise - 03/28/17 0001      Traction   Type of Traction  Cervical    Min (lbs)  5    Max (lbs)  17    Hold Time  60    Rest Time  10    Time  15      Manual Therapy   Soft tissue mobilization  subclavius, scalenes, anterior deltoid (right side); bilateral cerival suboccipitals cervical paraspinals    Myofascial Release  cervical and cranial MFR, suboccipitals, cervical paraspinals    Manual Traction  cervical traction                PT Short Term Goals - 03/13/17 1313      PT SHORT TERM GOAL #1   Title  pt ind with initial HEP    Time  4    Period  Weeks    Status   Achieved      PT SHORT TERM GOAL #2   Title  pt will be able to perform bed mobility without using hands to support head    Time  4    Period  Weeks    Status  Achieved        PT Long Term Goals - 03/21/17 1351      PT LONG TERM GOAL #1   Title  pt will be ind with advanced HEP    Time  8    Period  Weeks    Status  On-going      PT LONG TERM GOAL #2   Title  pt will be able to return to pickle ball without experiencing onset of neck pain/heaviness after playing    Time  8    Period  Weeks    Status  On-going      PT LONG TERM GOAL #3   Title  FOTO < or = to 34% limited    Time  8    Period  Weeks    Status  On-going      PT LONG TERM GOAL #4   Title  pt demonstrates improved cervical rotation to 65 deg bilaterally for improved ability to navigate traffic while driving    Time  8    Period  Weeks    Status  On-going            Plan - 03/28/17 1319    Clinical Impression Statement  Pt had increased tension in right shoulder and had muscle spasms in right scalenes, anterior deltoid and subclavius.  Pt responded well to [facility] therapy with decreased discomfort and tightness reported after treatment.  Pt continue to have dizziness upon sitting up from supine but states it is getting better.  Pt will benefit from skilled PT to work on functional movements with increased head ROM.    PT Treatment/Interventions  ADLs/Self Care Home Management;Biofeedback;Cryotherapy;Electrical Stimulation;Moist Heat;Traction;Ultrasound;Gait training;Stair training;Functional mobility training;Therapeutic activities;Therapeutic exercise;Balance training;Neuromuscular re-education;Patient/family education;Manual techniques;Passive range of motion;Dry needling;Taping;Vestibular;Canalith Repostioning    PT Next Visit Plan  cervical traction, progress exercises as tolerated, cervical rotation and flex/ext with gaze fixed    Consulted and Agree with Plan of Care  Patient       Patient will  benefit from skilled therapeutic intervention in order to improve the following deficits and impairments:  Pain, Postural dysfunction, Dizziness, Increased muscle spasms  Visit Diagnosis: Cervicalgia  Other muscle spasm     Problem List Patient Active Problem List   Diagnosis Date Noted  . Hyperglycemia 03/06/2017  . GERD (gastroesophageal reflux disease) 04/20/2016  . Numbness 08/06/2015  . Osteopenia 12/10/2014  . Allergic urticaria 12/10/2014  . Pressure in head 04/01/2014  . Dizziness 02/10/2014  . Elevated blood pressure reading 11/06/2013  . Hypothyroid   . Primary HSV infection of mouth     Zannie Cove, PT 03/28/2017, 1:22 PM  [facility] [address] [place], [place], [address] Phone: [phone]   Fax:  [phone]  Name: Tina Lucas MRN: [id number] Date of Birth: 09/21/1950

## 2017-04-04 ENCOUNTER — Ambulatory Visit: Payer: Medicare Other | Admitting: Physical Therapy

## 2017-04-04 ENCOUNTER — Encounter: Payer: Self-pay | Admitting: Physical Therapy

## 2017-04-04 DIAGNOSIS — M542 Cervicalgia: Secondary | ICD-10-CM

## 2017-04-04 DIAGNOSIS — M62838 Other muscle spasm: Secondary | ICD-10-CM

## 2017-04-04 NOTE — Patient Instructions (Signed)
This week do the following:  1.   Standing on one leg, keep eyes forward and turn head side to side - 10x on each leg  2.   SIT TO STAND - NO SUPPORT  Start by scooting close to the front of the chair.  Next, lean forward at your trunk and reach forward with your arms and rise to standing without using your hands to push off from the chair or other object.   Use your arms as a counter-balance by reaching forward when in sitting and lower them as you approach standing.   2 sets of 10/day   3. [facility] maneuver  [facility] [address], [place], [place] [address] Phone # [phone] Fax [phone]

## 2017-04-04 NOTE — Therapy (Signed)
[facility] [address] [place], [place], [address] Phone: [phone]   Fax:  [phone]  Physical Therapy Treatment  Patient Details  Name: Tina Lucas MRN: [id number] Date of Birth: April 02, 1950 Referring Provider: Binnie Rail, MD   Encounter Date: 04/04/2017  PT End of Session - 04/04/17 1104    Visit Number  10    Date for PT Re-Evaluation  04/18/17    Authorization Type  medicare    PT Start Time  1103    PT Stop Time  1145    PT Time Calculation (min)  42 min    Activity Tolerance  Treatment limited secondary to medical complications (Comment)    Behavior During Therapy  [facility] for tasks assessed/performed       Past Medical History:  Diagnosis Date  . Allergic rhinitis   . History of asthma 1956-2000  . Hypothyroid 2013  . Primary HSV infection of mouth 2008  . 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 TONSILLECTOMY  1958    There were no vitals filed for this visit.  Subjective Assessment - 04/04/17 1142    Subjective  Patient reports feeling better overall, but still feels dizzy getting up in the morning and doing too much "back and forth".  She reports she has had less dizziness when getting lobs in pickleball.    Currently in Pain?  No/denies                      [facility] Adult PT Treatment/Exercise - 04/04/17 0001      Neuro Re-ed    Neuro Re-ed Details   sit to stand with posture alignment hips back and cervical spine neutral looking forward, single leg standing with head turns gaze fixed - 10x each      Traction   Type of Traction  Cervical    Min (lbs)  5    Max (lbs)  17    Hold Time  60    Rest Time  10    Time  15             PT Education - 04/04/17 1139    Education provided  Yes    Education Details  HEP    Person(s) Educated  Patient    Methods   Explanation;Demonstration    Comprehension  Verbalized understanding;Returned demonstration       PT Short Term Goals - 03/13/17 1313      PT SHORT TERM GOAL #1   Title  pt ind with initial HEP    Time  4    Period  Weeks    Status  Achieved      PT SHORT TERM GOAL #2   Title  pt will be able to perform bed mobility without using hands to support head    Time  4    Period  Weeks    Status  Achieved        PT Long Term Goals - 03/21/17 1351      PT LONG TERM GOAL #1   Title  pt will be ind with advanced HEP    Time  8    Period  Weeks    Status  On-going      PT LONG TERM GOAL #2   Title  pt will be able to return to pickle ball without experiencing onset  of neck pain/heaviness after playing    Time  8    Period  Weeks    Status  On-going      PT LONG TERM GOAL #3   Title  FOTO < or = to 34% limited    Time  8    Period  Weeks    Status  On-going      PT LONG TERM GOAL #4   Title  pt demonstrates improved cervical rotation to 65 deg bilaterally for improved ability to navigate traffic while driving    Time  8    Period  Weeks    Status  On-going            Plan - 04/04/17 1143    Clinical Impression Statement  Pt has 38% limitation down from 47% on eval.  Pt is doing better overall and was able to progress exercises today.  Pt is benefitting from traction that is relieving some symptoms of stiffness.  She will benefit from skilled PT to continue working on improved cervical movements with less dizziness.    PT Treatment/Interventions  ADLs/Self Care Home Management;Biofeedback;Cryotherapy;Electrical Stimulation;Moist Heat;Traction;Ultrasound;Gait training;Stair training;Functional mobility training;Therapeutic activities;Therapeutic exercise;Balance training;Neuromuscular re-education;Patient/family education;Manual techniques;Passive range of motion;Dry needling;Taping;Vestibular;Canalith Repostioning    PT Next Visit Plan  cervical traction, progress  exercises as tolerated, cervical rotation and flex/ext with gaze fixed    Consulted and Agree with Plan of Care  Patient       Patient will benefit from skilled therapeutic intervention in order to improve the following deficits and impairments:  Pain, Postural dysfunction, Dizziness, Increased muscle spasms  Visit Diagnosis: Cervicalgia  Other muscle spasm     Problem List Patient Active Problem List   Diagnosis Date Noted  . Hyperglycemia 03/06/2017  . GERD (gastroesophageal reflux disease) 04/20/2016  . Numbness 08/06/2015  . Osteopenia 12/10/2014  . Allergic urticaria 12/10/2014  . Pressure in head 04/01/2014  . Dizziness 02/10/2014  . Elevated blood pressure reading 11/06/2013  . Hypothyroid   . Primary HSV infection of mouth     Zannie Cove, PT 04/04/2017, 11:46 AM  [facility] [address], [place], [place], [address] Phone: [phone]   Fax:  [phone]  Name: Tina Lucas MRN: [id number] Date of Birth: 1950/08/01

## 2017-04-11 ENCOUNTER — Encounter: Payer: Self-pay | Admitting: Physical Therapy

## 2017-04-11 ENCOUNTER — Ambulatory Visit: Payer: Medicare Other | Admitting: Physical Therapy

## 2017-04-11 DIAGNOSIS — M542 Cervicalgia: Secondary | ICD-10-CM

## 2017-04-11 DIAGNOSIS — M62838 Other muscle spasm: Secondary | ICD-10-CM

## 2017-04-11 NOTE — Therapy (Signed)
[facility] [address] [place], [place], [address] Phone: [phone]   Fax:  [phone]  Physical Therapy Treatment  Patient Details  Name: Tina Lucas MRN: [id number] Date of Birth: 02-Mar-1950 Referring Provider: Binnie Rail, MD   Encounter Date: 04/11/2017  PT End of Session - 04/11/17 1150    Visit Number  11    Date for PT Re-Evaluation  04/18/17    Authorization Type  medicare    PT Start Time  1102    PT Stop Time  1152    PT Time Calculation (min)  50 min    Activity Tolerance  Patient tolerated treatment well    Behavior During Therapy  [facility] for tasks assessed/performed       Past Medical History:  Diagnosis Date  . Allergic rhinitis   . History of asthma 1956-2000  . Hypothyroid 2013  . Primary HSV infection of mouth 2008  . 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 TONSILLECTOMY  1958    There were no vitals filed for this visit.  Subjective Assessment - 04/11/17 1109    Subjective  Patient states she has done the exercises and feeling good with everything.  She states the traction may have caused a strain in her neck    Currently in Pain?  No/denies                      [facility] Adult PT Treatment/Exercise - 04/11/17 0001      Neuro Re-ed    Neuro Re-ed Details   standing with head turns, single leg on and off foam, breaks as needed when feeling dizzy, review BBQ      Traction   Type of Traction  Cervical    Min (lbs)  5    Max (lbs)  17    Hold Time  60    Rest Time  10    Time  15             PT Education - 04/11/17 1149    Education provided  Yes    Education Details  reviewed BBQ and single leg without UE support    Person(s) Educated  Patient    Methods  Explanation;Demonstration;Handout;Verbal cues    Comprehension  Verbalized  understanding;Returned demonstration       PT Short Term Goals - 03/13/17 1313      PT SHORT TERM GOAL #1   Title  pt ind with initial HEP    Time  4    Period  Weeks    Status  Achieved      PT SHORT TERM GOAL #2   Title  pt will be able to perform bed mobility without using hands to support head    Time  4    Period  Weeks    Status  Achieved        PT Long Term Goals - 04/11/17 1153      PT LONG TERM GOAL #1   Title  pt will be ind with advanced HEP    Time  8    Period  Weeks    Status  On-going      PT LONG TERM GOAL #2   Title  pt will be able to return to pickle ball without experiencing onset of neck pain/heaviness after playing    Baseline  able  to do last week    Time  8    Period  Weeks    Status  Achieved      PT LONG TERM GOAL #3   Title  FOTO < or = to 34% limited    Baseline  38%    Time  8    Period  Weeks    Status  Partially Met      PT LONG TERM GOAL #4   Title  pt demonstrates improved cervical rotation to 65 deg bilaterally for improved ability to navigate traffic while driving    Time  8    Period  Weeks    Status  On-going            Plan - 04/11/17 1151    Clinical Impression Statement  Pt has been making steady progress.  She reports her best day of pickelball since starting PT last week.  Pt is able to progress exercises and is not having as severe symptoms with exercises.  Pt will benefit from skilled PT to cotinue working on cervical ROM and cervical traction for reduced muscle spasms.    PT Treatment/Interventions  ADLs/Self Care Home Management;Biofeedback;Cryotherapy;Electrical Stimulation;Moist Heat;Traction;Ultrasound;Gait training;Stair training;Functional mobility training;Therapeutic activities;Therapeutic exercise;Balance training;Neuromuscular re-education;Patient/family education;Manual techniques;Passive range of motion;Dry needling;Taping;Vestibular;Canalith Repostioning    PT Next Visit Plan  cervical traction,  progress exercises as tolerated, cervical rotation and flex/ext with and without gaze fixed    Consulted and Agree with Plan of Care  Patient       Patient will benefit from skilled therapeutic intervention in order to improve the following deficits and impairments:  Pain, Postural dysfunction, Dizziness, Increased muscle spasms  Visit Diagnosis: Cervicalgia  Other muscle spasm     Problem List Patient Active Problem List   Diagnosis Date Noted  . Hyperglycemia 03/06/2017  . GERD (gastroesophageal reflux disease) 04/20/2016  . Numbness 08/06/2015  . Osteopenia 12/10/2014  . Allergic urticaria 12/10/2014  . Pressure in head 04/01/2014  . Dizziness 02/10/2014  . Elevated blood pressure reading 11/06/2013  . Hypothyroid   . Primary HSV infection of mouth     Zannie Cove, PT 04/11/2017, 12:00 PM  [facility] [address] [place], [place], [address] Phone: [phone]   Fax:  [phone]  Name: Tina Lucas MRN: [id number] Date of Birth: Oct 16, 1950

## 2017-04-18 ENCOUNTER — Encounter: Payer: Self-pay | Admitting: Podiatry

## 2017-04-18 ENCOUNTER — Ambulatory Visit: Payer: Medicare Other | Admitting: Podiatry

## 2017-04-18 DIAGNOSIS — B351 Tinea unguium: Secondary | ICD-10-CM | POA: Diagnosis not present

## 2017-04-18 NOTE — Progress Notes (Signed)
Subjective:   Patient ID: Tina Lucas, female   DOB: 67 y.o.   MRN: [id number]   HPI Patient presents with elongated nails 1-5 both feet that are thick brittle and patient cannot cut   ROS      Objective:  Physical Exam  Neurovascular status intact with thick yellow brittle nailbeds 1-5 both feet that are painful     Assessment:  Chronic mycotic nail infections 1-5 both feet with pain     Plan:  Debride painful nailbeds 1-5 both feet with no iatrogenic bleeding noted

## 2017-04-19 ENCOUNTER — Ambulatory Visit: Payer: Medicare Other | Admitting: Physical Therapy

## 2017-04-19 DIAGNOSIS — M542 Cervicalgia: Secondary | ICD-10-CM

## 2017-04-19 DIAGNOSIS — M62838 Other muscle spasm: Secondary | ICD-10-CM

## 2017-04-19 NOTE — Therapy (Signed)
[facility] [address], [place], [place], [address] Phone: [phone]   Fax:  [phone]  Physical Therapy Treatment  Patient Details  Name: Tina Lucas MRN: [id number] Date of Birth: 10/02/1950 Referring Provider: Binnie Rail, MD   Encounter Date: 04/19/2017  PT End of Session - 04/19/17 1543    Visit Number  12    Date for PT Re-Evaluation  05/31/17    Authorization Type  medicare    PT Start Time  1532    PT Stop Time  1616    PT Time Calculation (min)  44 min    Activity Tolerance  Patient tolerated treatment well    Behavior During Therapy  [facility] for tasks assessed/performed       Past Medical History:  Diagnosis Date  . Allergic rhinitis   . History of asthma 1956-2000  . Hypothyroid 2013  . Primary HSV infection of mouth 2008  . 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 TONSILLECTOMY  1958    There were no vitals filed for this visit.  Subjective Assessment - 04/19/17 1717    Subjective  Patient reports she had a very bad week.  She is now feeling soreness thoughout left pectoral region . She had very bad vertigo when performing the BBQ from the printout last session. She did the one from the youtube channel and then felt better 2 days after that.  States she is still getting symptoms but overall feels improvements.    Limitations  Other (comment);House hold activities gong to [employer], getting out of bed    Currently in Pain?  No/denies         [facility] PT Assessment - 04/19/17 0001      AROM   Cervical - Right Rotation  70    Cervical - Left Rotation  75            No data recorded       [facility] Adult PT Treatment/Exercise - 04/19/17 0001      Manual Therapy   Myofascial Release  cervical and cranial MFR, suboccipitals, cervical paraspinals    Manual Traction   cervical traction                PT Short Term Goals - 03/13/17 1313      PT SHORT TERM GOAL #1   Title  pt ind with initial HEP    Time  4    Period  Weeks    Status  Achieved      PT SHORT TERM GOAL #2   Title  pt will be able to perform bed mobility without using hands to support head    Time  4    Period  Weeks    Status  Achieved        PT Long Term Goals - 04/19/17 1552      PT LONG TERM GOAL #1   Title  pt will be ind with advanced HEP    Time  8    Period  Weeks    Status  On-going    Target Date  05/31/17      PT LONG TERM GOAL #2   Title  pt will be able to get out of bed without increased symptoms of dizziness    Time  6    Period  Weeks    Status  New  Target Date  05/31/17      PT LONG TERM GOAL #3   Title  FOTO < or = to 34% limited    Baseline  38%    Time  6    Period  Weeks    Status  Partially Met    Target Date  05/31/17      PT LONG TERM GOAL #4   Title  pt demonstrates improved cervical rotation to 65 deg bilaterally for improved ability to navigate traffic while driving    Baseline  70 deg right; 75 deg  left    Time  --    Status  Achieved      PT LONG TERM GOAL #5   Title  Pt will be able to manage acute symptoms of dizziness using BBQ and eply maneuvers as needed    Time  6    Period  Weeks    Status  New    Target Date  05/31/17            Plan - 04/19/17 1628    Clinical Impression Statement  Patient has achieved 2 long term goals of increased cervical AROM and ability to play pickle ball.  She is now playing 8 games and doing well with minimal vertigo episodes and decreased heaviness in the back of her head.  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  She had recent episode of severe dizziness which went away after a few days.  Pt reports still having some episodes of severe heaviness in the back of the head.  Pt has shown slow and steady progress for a complicated  problem.  PT has reason to believe that she will continue to make improvements and recommends PT as mentioned below to address remaining functional goals.    Rehab Potential  Good    PT Frequency  1x / week    PT Duration  6 weeks    PT Treatment/Interventions  ADLs/Self Care Home Management;Biofeedback;Cryotherapy;Electrical Stimulation;Moist Heat;Traction;Ultrasound;Gait training;Stair training;Functional mobility training;Therapeutic activities;Therapeutic exercise;Balance training;Neuromuscular re-education;Patient/family education;Manual techniques;Passive range of motion;Dry needling;Taping;Vestibular;Canalith Repostioning    PT Next Visit Plan  MFR, thoracic extension, STM to shoulder girdle and upper thoracic region for improved mobility, cervical traction, progress exercises as tolerated, cervical rotation and flex/ext with and without gaze fixed    Consulted and Agree with Plan of Care  Patient       Patient will benefit from skilled therapeutic intervention in order to improve the following deficits and impairments:  Pain, Postural dysfunction, Dizziness, Increased muscle spasms  Visit Diagnosis: Cervicalgia  Other muscle spasm     Problem List Patient Active Problem List   Diagnosis Date Noted  . Hyperglycemia 03/06/2017  . GERD (gastroesophageal reflux disease) 04/20/2016  . Numbness 08/06/2015  . Osteopenia 12/10/2014  . Allergic urticaria 12/10/2014  . Pressure in head 04/01/2014  . Dizziness 02/10/2014  . Elevated blood pressure reading 11/06/2013  . Hypothyroid   . Primary HSV infection of mouth     Zannie Cove, PT 04/19/2017, 5:33 PM  [facility] [address], [place], [place], [address] Phone: [phone]   Fax:  [phone]  Name: Tina Lucas MRN: [id number] Date of Birth: Jul 13, 1950

## 2017-04-24 ENCOUNTER — Ambulatory Visit: Payer: Medicare Other | Attending: Internal Medicine | Admitting: Physical Therapy

## 2017-04-24 ENCOUNTER — Encounter: Payer: Self-pay | Admitting: Physical Therapy

## 2017-04-24 DIAGNOSIS — M542 Cervicalgia: Secondary | ICD-10-CM | POA: Diagnosis present

## 2017-04-24 DIAGNOSIS — M62838 Other muscle spasm: Secondary | ICD-10-CM | POA: Insufficient documentation

## 2017-04-24 NOTE — Therapy (Signed)
[facility] [address], [place], [place], [address] Phone: [phone]   Fax:  [phone]  Physical Therapy Treatment  Patient Details  Name: Feven Alderfer MRN: [id number] Date of Birth: [birth year] Referring Provider: Binnie Rail, MD   Encounter Date: 04/24/2017  PT End of Session - 04/24/17 1507    Visit Number  13    Date for PT Re-Evaluation  05/31/17    Authorization Type  medicare    PT Start Time  1405    PT Stop Time  1450    PT Time Calculation (min)  45 min    Activity Tolerance  Patient tolerated treatment well    Behavior During Therapy  [facility] for tasks assessed/performed       Past Medical History:  Diagnosis Date  . Allergic rhinitis   . History of asthma 1956-2000  . Hypothyroid 2013  . Primary HSV infection of mouth 2008  . 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 TONSILLECTOMY  1958    There were no vitals filed for this visit.  Subjective Assessment - 04/24/17 1448    Subjective  Pt states she has been feeling much better since last treatment.  The chest pain is better.  States until today hasn't had the dizziness or back of the head.      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  No/denies                       [facility] Adult PT Treatment/Exercise - 04/24/17 0001      Neuro Re-ed    Neuro Re-ed Details   sigle leg standing with head turns gaze fixed and unfixed - 10x each      Manual Therapy   Myofascial Release  cervical and cranial MFR, suboccipitals, cervical paraspinals    Manual Traction  cervical traction                PT Short Term Goals - 03/13/17 1313      PT SHORT TERM GOAL #1   Title  pt ind with initial HEP    Time  4    Period  Weeks    Status  Achieved      PT SHORT  TERM GOAL #2   Title  pt will be able to perform bed mobility without using hands to support head    Time  4    Period  Weeks    Status  Achieved        PT Long Term Goals - 04/24/17 1640      PT LONG TERM GOAL #1   Title  pt will be ind with advanced HEP    Time  8    Period  Weeks    Status  On-going      PT LONG TERM GOAL #2   Title  pt will be able to get out of bed without increased symptoms of dizziness    Baseline  felt good all week except today    Time  6    Period  Weeks    Status  On-going      PT LONG TERM GOAL #3   Title  FOTO < or = to 34% limited    Time  6    Period  Weeks  Status  Partially Met      PT LONG TERM GOAL #5   Title  Pt will be able to manage acute symptoms of dizziness using BBQ and eply maneuvers as needed    Baseline  did well with BBQ and felt better last week    Time  6    Period  Weeks    Status  On-going            Plan - 04/24/17 1508    Clinical Impression Statement  Pt did well last week with manageing her symptoms.  She has been challenged with single leg exercise and was able to demonstrate correctly.  Pt was educated and performed standing with head turns without fixed gaze.  She had good response from myofascial release to suboccipitals today and felt more relaxed during manual treatment . Pt has complicated condition involving BPPV in additon to cervicogenic dizziness which can take time to improve.  She will benefit from skilled PT to continue to progress exercises while managing symptoms of muscle tension and stiffness that seem to increase her dizziness.    PT Treatment/Interventions  ADLs/Self Care Home Management;Biofeedback;Cryotherapy;Electrical Stimulation;Moist Heat;Traction;Ultrasound;Gait training;Stair training;Functional mobility training;Therapeutic activities;Therapeutic exercise;Balance training;Neuromuscular re-education;Patient/family education;Manual techniques;Passive range of motion;Dry  needling;Taping;Vestibular;Canalith Repostioning    PT Next Visit Plan  MFR, thoracic extension, STM to shoulder girdle and upper thoracic region for improved mobility, cervical traction, progress exercises as tolerated, cervical rotation and flex/ext with and without gaze fixed single leg    Consulted and Agree with Plan of Care  Patient       Patient will benefit from skilled therapeutic intervention in order to improve the following deficits and impairments:  Pain, Postural dysfunction, Dizziness, Increased muscle spasms  Visit Diagnosis: Cervicalgia  Other muscle spasm     Problem List Patient Active Problem List   Diagnosis Date Noted  . Hyperglycemia 03/06/2017  . GERD (gastroesophageal reflux disease) 04/20/2016  . Numbness 08/06/2015  . Osteopenia 12/10/2014  . Allergic urticaria 12/10/2014  . Pressure in head 04/01/2014  . Dizziness 02/10/2014  . Elevated blood pressure reading 11/06/2013  . Hypothyroid   . Primary HSV infection of mouth     Zannie Cove, PT 04/24/2017, 4:51 PM  [facility] [address], [place], [place], [address] Phone: [phone]   Fax:  [phone]  Name: Melanye Hiraldo MRN: [id number] Date of Birth: 07/30/1950

## 2017-04-27 ENCOUNTER — Other Ambulatory Visit: Payer: Medicare Other

## 2017-04-30 ENCOUNTER — Telehealth: Payer: Self-pay | Admitting: Emergency Medicine

## 2017-04-30 NOTE — Telephone Encounter (Signed)
Called patient to schedule AWV. Patient declined at this time. 

## 2017-05-02 ENCOUNTER — Ambulatory Visit: Payer: Medicare Other | Admitting: Physical Therapy

## 2017-05-02 DIAGNOSIS — M542 Cervicalgia: Secondary | ICD-10-CM | POA: Diagnosis not present

## 2017-05-02 DIAGNOSIS — M62838 Other muscle spasm: Secondary | ICD-10-CM

## 2017-05-02 NOTE — Therapy (Signed)
[facility] [address] [place], [place], [address] Phone: [phone]   Fax:  [phone]  Physical Therapy Treatment  Patient Details  Name: Tina Lucas MRN: [id number] Date of Birth: [birth year] Referring Provider: Binnie Rail, MD   Encounter Date: 05/02/2017  PT End of Session - 05/02/17 1106    Visit Number  14    Date for PT Re-Evaluation  05/31/17    Authorization Type  medicare    PT Start Time  1105    PT Stop Time  1145    PT Time Calculation (min)  40 min    Activity Tolerance  Patient tolerated treatment well    Behavior During Therapy  [facility] for tasks assessed/performed       Past Medical History:  Diagnosis Date  . Allergic rhinitis   . History of asthma 1956-2000  . Hypothyroid 2013  . Primary HSV infection of mouth 2008  . 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 TONSILLECTOMY  1958    There were no vitals filed for this visit.  Subjective Assessment - 05/02/17 1108    Subjective  pt states she has been better than last week.  States she is still having difficulty with the single leg exercises.      Limitations  Other (comment);House hold activities getting out of bed    Currently in Pain?  No/denies                       [facility] Adult PT Treatment/Exercise - 05/02/17 0001      Neuro Re-ed    Neuro Re-ed Details   single leg standing head turns with fixed and nonfixed gaze; single leg on foam with fixed and nonfixed gaze - 3x each side had slightly increased symptoms after      Traction   Type of Traction  Cervical    Min (lbs)  5    Max (lbs)  17    Hold Time  60    Rest Time  10    Time  10      Manual Therapy   Myofascial Release  cervical and cranial MFR, suboccipitals, cervical paraspinals               PT Short Term Goals - 03/13/17 1313      PT SHORT TERM GOAL #1   Title  pt ind with initial HEP    Time  4    Period  Weeks    Status  Achieved      PT SHORT TERM GOAL #2   Title  pt will be able to perform bed mobility without using hands to support head    Time  4    Period  Weeks    Status  Achieved        PT Long Term Goals - 04/24/17 1640      PT LONG TERM GOAL #1   Title  pt will be ind with advanced HEP    Time  8    Period  Weeks    Status  On-going      PT LONG TERM GOAL #2   Title  pt will be able to get out of bed without increased symptoms of dizziness    Baseline  felt good all week except today    Time  6    Period  Weeks    Status  On-going      PT LONG TERM GOAL #3   Title  FOTO < or = to 34% limited    Time  6    Period  Weeks    Status  Partially Met      PT LONG TERM GOAL #5   Title  Pt will be able to manage acute symptoms of dizziness using BBQ and eply maneuvers as needed    Baseline  did well with BBQ and felt better last week    Time  6    Period  Weeks    Status  On-going            Plan - 05/02/17 1143    Clinical Impression Statement  Pt is doing well with HEP and making some progress towards more challenging balance exercises.  This week was overall improved from previous week where she had a set back.  Pt still experiences dizziness in general.  Progress continues to be slow, but today was first time attempting traction and then myofascial release.  Pt was able to increase difficulty of balance exercises with standing on foam mat and single leg..  Pt will benefit from skilled PT to work on reduction of vetigo for improved function.    Rehab Potential  Good    PT Treatment/Interventions  ADLs/Self Care Home Management;Biofeedback;Cryotherapy;Electrical Stimulation;Moist Heat;Traction;Ultrasound;Gait training;Stair training;Functional mobility training;Therapeutic activities;Therapeutic exercise;Balance training;Neuromuscular re-education;Patient/family education;Manual  techniques;Passive range of motion;Dry needling;Taping;Vestibular;Canalith Repostioning    PT Next Visit Plan  re-eval next? MFR, thoracic extension, STM to shoulder girdle and upper thoracic region for improved mobility, cervical traction, progress exercises as tolerated, cervical rotation and flex/ext with and without gaze fixed single leg    PT Home Exercise Plan  progress as needed; patient likes to have pictures    Consulted and Agree with Plan of Care  Patient       Patient will benefit from skilled therapeutic intervention in order to improve the following deficits and impairments:  Pain, Postural dysfunction, Dizziness, Increased muscle spasms  Visit Diagnosis: Cervicalgia  Other muscle spasm     Problem List Patient Active Problem List   Diagnosis Date Noted  . Hyperglycemia 03/06/2017  . GERD (gastroesophageal reflux disease) 04/20/2016  . Numbness 08/06/2015  . Osteopenia 12/10/2014  . Allergic urticaria 12/10/2014  . Pressure in head 04/01/2014  . Dizziness 02/10/2014  . Elevated blood pressure reading 11/06/2013  . Hypothyroid   . Primary HSV infection of mouth     Zannie Cove, PT 05/02/2017, 2:04 PM  [facility] [address] [place], [place], [address] Phone: [phone]   Fax:  [phone]  Name: Staci Dack MRN: [id number] Date of Birth: February 15, 1950

## 2017-05-09 ENCOUNTER — Ambulatory Visit: Payer: Medicare Other | Admitting: Physical Therapy

## 2017-05-09 ENCOUNTER — Encounter: Payer: Self-pay | Admitting: Physical Therapy

## 2017-05-09 DIAGNOSIS — M542 Cervicalgia: Secondary | ICD-10-CM | POA: Diagnosis not present

## 2017-05-09 DIAGNOSIS — M62838 Other muscle spasm: Secondary | ICD-10-CM

## 2017-05-09 NOTE — Therapy (Signed)
[facility] [address] [place], [place], [address] Phone: [phone]   Fax:  [phone]  Physical Therapy Treatment  Patient Details  Name: Tina Lucas MRN: [id number] Date of Birth: Jan 28, 1950 Referring Provider: Binnie Rail, MD   Encounter Date: 05/09/2017  PT End of Session - 05/09/17 1234    Visit Number  15    Date for PT Re-Evaluation  05/31/17    Authorization Type  medicare    PT Start Time  9833    PT Stop Time  1315    PT Time Calculation (min)  40 min    Activity Tolerance  Patient tolerated treatment well    Behavior During Therapy  [facility] for tasks assessed/performed       Past Medical History:  Diagnosis Date  . Allergic rhinitis   . History of asthma 1956-2000  . Hypothyroid 2013  . Primary HSV infection of mouth 2008  . 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 TONSILLECTOMY  1958    There were no vitals filed for this visit.  Subjective Assessment - 05/09/17 2001    Subjective  Pt states she has been doing much better and that has felt the best it has in a while.  No complaints other than slight dizziness when getting up after sleeping or lying down.    Limitations  Other (comment);House hold activities    Currently in Pain?  No/denies                       [facility] Adult PT Treatment/Exercise - 05/09/17 0001      Neuro Re-ed    Neuro Re-ed Details   single leg standing on grey disc - needs 1 UE support and small head turns to fatigue Rt/Lt LE      Traction   Type of Traction  Cervical    Min (lbs)  5    Max (lbs)  17    Hold Time  60    Rest Time  10    Time  10      Manual Therapy   Myofascial Release  cervical and cranial MFR, suboccipitals, cervical paraspinals               PT Short Term Goals - 03/13/17 1313      PT SHORT TERM GOAL #1   Title  pt ind with initial HEP    Time  4    Period  Weeks    Status  Achieved      PT SHORT TERM GOAL #2   Title  pt will be able to perform bed mobility without using hands to support head    Time  4    Period  Weeks    Status  Achieved        PT Long Term Goals - 04/24/17 1640      PT LONG TERM GOAL #1   Title  pt will be ind with advanced HEP    Time  8    Period  Weeks    Status  On-going      PT LONG TERM GOAL #2   Title  pt will be able to get out of bed without increased symptoms of dizziness    Baseline  felt good all week except today    Time  6    Period  Weeks  Status  On-going      PT LONG TERM GOAL #3   Title  FOTO < or = to 34% limited    Time  6    Period  Weeks    Status  Partially Met      PT LONG TERM GOAL #5   Title  Pt will be able to manage acute symptoms of dizziness using BBQ and eply maneuvers as needed    Baseline  did well with BBQ and felt better last week    Time  6    Period  Weeks    Status  On-going            Plan - 05/09/17 2003    Clinical Impression Statement  Pt is making slow and steady progress due to complicated diagnosis of cervicogenic vertigo which is difficult and can take a long time to treat.  Pt is able to do single leg balance currently without increased dizziness.  Pt is having eye surgery in 2 weeks and will benefit from skilled PT to continue to work on blance with head movements and challenging vestibular system as well as continue to work on release of cervical fascial restrictions and decompression.   Most likely will plan to request re-certification to continue to treat at next visit.    PT Treatment/Interventions  ADLs/Self Care Home Management;Biofeedback;Cryotherapy;Electrical Stimulation;Moist Heat;Traction;Ultrasound;Gait training;Stair training;Functional mobility training;Therapeutic activities;Therapeutic exercise;Balance training;Neuromuscular re-education;Patient/family education;Manual  techniques;Passive range of motion;Dry needling;Taping;Vestibular;Canalith Repostioning    PT Next Visit Plan  re-eval next, MFR, thoracic extension, STM to shoulder girdle and upper thoracic region for improved mobility, cervical traction, progress exercises as tolerated, cervical rotation and flex/ext with and without gaze fixed single leg    Consulted and Agree with Plan of Care  Patient       Patient will benefit from skilled therapeutic intervention in order to improve the following deficits and impairments:  Pain, Postural dysfunction, Dizziness, Increased muscle spasms  Visit Diagnosis: Cervicalgia  Other muscle spasm     Problem List Patient Active Problem List   Diagnosis Date Noted  . Hyperglycemia 03/06/2017  . GERD (gastroesophageal reflux disease) 04/20/2016  . Numbness 08/06/2015  . Osteopenia 12/10/2014  . Allergic urticaria 12/10/2014  . Pressure in head 04/01/2014  . Dizziness 02/10/2014  . Elevated blood pressure reading 11/06/2013  . Hypothyroid   . Primary HSV infection of mouth     Zannie Cove, PT 05/09/2017, 8:11 PM  [facility] [address], [place], [place], [address] Phone: [phone]   Fax:  [phone]  Name: Tina Lucas MRN: [id number] Date of Birth: 02-14-1950

## 2017-05-14 ENCOUNTER — Ambulatory Visit: Payer: Medicare Other | Admitting: Family

## 2017-05-14 ENCOUNTER — Encounter: Payer: Self-pay | Admitting: Family

## 2017-05-14 ENCOUNTER — Ambulatory Visit (INDEPENDENT_AMBULATORY_CARE_PROVIDER_SITE_OTHER)
Admission: RE | Admit: 2017-05-14 | Discharge: 2017-05-14 | Disposition: A | Discharge status: Home / Self Care | Payer: Medicare Other | Source: Ambulatory Visit | Attending: Family | Admitting: Family

## 2017-05-14 VITALS — BP 140/76 | HR 68 | Temp 98.4°F | Ht 68.0 in | Wt 125.0 lb

## 2017-05-14 DIAGNOSIS — R05 Cough: Secondary | ICD-10-CM | POA: Diagnosis not present

## 2017-05-14 DIAGNOSIS — R0789 Other chest pain: Secondary | ICD-10-CM

## 2017-05-14 DIAGNOSIS — K219 Gastro-esophageal reflux disease without esophagitis: Secondary | ICD-10-CM

## 2017-05-14 DIAGNOSIS — R059 Cough, unspecified: Secondary | ICD-10-CM

## 2017-05-14 MED ORDER — OMEPRAZOLE 40 MG PO CPDR: 40.0000 mg | DELAYED_RELEASE_CAPSULE | Freq: Every day | ORAL | 3 refills | Status: DC

## 2017-05-14 NOTE — Progress Notes (Signed)
Karsynn Deweese is a 67 y.o. female with the following history as recorded in EpicCare:  Patient Active Problem List   Diagnosis Date Noted  . Hyperglycemia 03/06/2017  . GERD (gastroesophageal reflux disease) 04/20/2016  . Numbness 08/06/2015  . Osteopenia 12/10/2014  . Allergic urticaria 12/10/2014  . Pressure in head 04/01/2014  . Dizziness 02/10/2014  . Elevated blood pressure reading 11/06/2013  . Hypothyroid   . Primary HSV infection of mouth     Current Outpatient Medications  Medication Sig Dispense Refill  . Calcium Carbonate-Vitamin D (CALTRATE 600+D) 600-400 MG-UNIT per tablet Take 1 tablet by mouth daily.    . celecoxib (CELEBREX) 100 MG capsule Take 100 mg by mouth daily.    . cholecalciferol (VITAMIN D) 400 UNITS TABS tablet Take 400 Units by mouth.    . fexofenadine (ALLEGRA) 180 MG tablet Take 180 mg by mouth daily.    . fluocinonide cream (LIDEX) 7.37 % Apply 1 application topically 2 (two) times daily as needed (FOR PSORIASIS).     Marland Kitchen levothyroxine (SYNTHROID, LEVOTHROID) 50 MCG tablet Take 1 tablet (50 mcg total) by mouth daily before breakfast. 90 tablet 3  . Magnesium 250 MG TABS Take 125 mg by mouth daily.    Marland Kitchen moxifloxacin (VIGAMOX) 0.5 % ophthalmic solution     . Multiple Vitamins-Minerals (ONE-A-DAY WOMENS 50+ ADVANTAGE PO) Take by mouth daily.    . prednisoLONE acetate (PRED FORTE) 1 % ophthalmic suspension     . Probiotic Product (PROBIOTIC DAILY) CAPS Take 1 capsule by mouth daily.    Marland Kitchen omeprazole (PRILOSEC) 40 MG capsule Take 1 capsule (40 mg total) by mouth daily. 30 capsule 3   No current facility-administered medications for this visit.     Allergies: Patient has no known allergies.  Past Medical History:  Diagnosis Date  . Allergic rhinitis   . History of asthma 1956-2000  . Hypothyroid 2013  . Primary HSV infection of mouth 2008  . Sunlight-induced angio-edema-urticaria 07/11/2011   unknown trigger, neg autoimmune workup    Past Surgical History:   Procedure Laterality Date  . TONSILLECTOMY  1958    Family History  Problem Relation Age of Onset  . Breast cancer Mother 45  . Osteoarthritis Father   . Hypertension Father   . Leukemia Maternal Aunt 41  . Atrial fibrillation Brother     Social History   Tobacco Use  . Smoking status: Never Smoker  . Smokeless tobacco: Never Used  Substance Use Topics  . Alcohol use: Yes    Alcohol/week: 0.0 oz    Subjective:  Patient presents with concerns for recurrent reflux; current episode of reflux present x 2-3 weeks; similar symptoms last spring and responded to Omeprazole; describes sensation of "burning in chest" and dry cough; increased burping; no chest pain or shortness of breath on exertion- had extensive cardiac work-up last year for similar symptoms; patient is adamant that she does not want to get EKG today but is agreeable to a chest x-ray today; patient has prescription for Omeprazole 40 mg but has not re-started her medication; is up to date on colon screening; no changes in bowel movements; no coffee grounds emesis;   Objective:  Vitals:   05/14/17 1341  BP: 140/76  Pulse: 68  Temp: 98.4 F (36.9 C)  TempSrc: Oral  SpO2: 98%  Weight: 125 lb (56.7 kg)  Height: 5\' 8"  (1.727 m)    General: Well developed, well nourished, in no acute distress  Skin : Warm and dry.  Head: Normocephalic and atraumatic  Lungs: Respirations unlabored; clear to auscultation bilaterally without wheeze, rales, rhonchi  CVS exam: normal rate and regular rhythm.  Abdomen: Soft; nontender; nondistended; normoactive bowel sounds; no masses or hepatosplenomegaly  Neurologic: Alert and oriented; speech intact; face symmetrical; moves all extremities well; CNII-XII intact without focal deficit  Assessment:  1. Cough   2. Atypical chest pain   3. Gastroesophageal reflux disease, esophagitis presence not specified     Plan:  Suspect that all of patient's symptoms are related to uncontrolled reflux;  I did recommend updating EKG in the office and she declines; she does agree to updating CXR today; encouraged her to re-start her Omeprazole- refill updated; I have encouraged patient to consider seeing GI in follow-up and be evaluated for need for endoscopy; explained to her that need to determine recurrent source of the reflux and to rule out any underlying infection; she agreed to consider after she has her cataract surgery next week- notes that she can only "handle one thing on her plate at a time."  No follow-ups on file.  Orders Placed This Encounter  Procedures  . DG Chest 2 View    Standing Status:   Future    Number of Occurrences:   1    Standing Expiration Date:   07/15/2018    Order Specific Question:   Reason for Exam (SYMPTOM  OR DIAGNOSIS REQUIRED)    Answer:   cough    Order Specific Question:   Preferred imaging location?    Answer:   [facility]    Order Specific Question:   Radiology Contrast Protocol - do NOT remove file path    Answer:   [email or web address]  . Ambulatory referral to Gastroenterology    Referral Priority:   Routine    Referral Type:   Consultation    Referral Reason:   Specialty Services Required    Number of Visits Requested:   1    Requested Prescriptions   Signed Prescriptions Disp Refills  . omeprazole (PRILOSEC) 40 MG capsule 30 capsule 3    Sig: Take 1 capsule (40 mg total) by mouth daily.

## 2017-05-15 ENCOUNTER — Ambulatory Visit: Payer: Medicare Other | Admitting: Physical Therapy

## 2017-05-16 NOTE — Progress Notes (Signed)
(  FYI) Spoke with patient today and she is feeling much better. She did have an episode with early with the medication but she is better now. Wanted to thank you for being so nice and professional as well. And she did see her xray this morning so all set.

## 2017-06-12 ENCOUNTER — Other Ambulatory Visit: Payer: Self-pay | Admitting: Internal Medicine

## 2017-06-14 ENCOUNTER — Encounter: Payer: Self-pay | Admitting: Physical Therapy

## 2017-06-14 ENCOUNTER — Ambulatory Visit: Payer: Medicare Other | Attending: Internal Medicine | Admitting: Physical Therapy

## 2017-06-14 DIAGNOSIS — M542 Cervicalgia: Secondary | ICD-10-CM | POA: Diagnosis not present

## 2017-06-14 DIAGNOSIS — M62838 Other muscle spasm: Secondary | ICD-10-CM | POA: Diagnosis present

## 2017-06-14 NOTE — Therapy (Signed)
[facility] [address], [place], [place], [address] Phone: [phone]   Fax:  [phone]  Physical Therapy Treatment  Patient Details  Name: Tina Lucas MRN: [id number] Date of Birth: 1950/06/20 Referring Provider: Binnie Rail, MD   Encounter Date: 06/14/2017  PT End of Session - 06/14/17 1241    Visit Number  16    Number of Visits  20    Date for PT Re-Evaluation  07/26/17    Authorization Type  medicare; use KX    PT Start Time  5726    PT Stop Time  1315    PT Time Calculation (min)  40 min    Activity Tolerance  Patient tolerated treatment well    Behavior During Therapy  [facility] for tasks assessed/performed       Past Medical History:  Diagnosis Date  . Allergic rhinitis   . History of asthma 1956-2000  . Hypothyroid 2013  . Primary HSV infection of mouth 2008  . 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 TONSILLECTOMY  1958    There were no vitals filed for this visit.  Subjective Assessment - 06/14/17 1243    Subjective  Pt had a setback due to a cataract surgery mishap caused from a faulty lense.     Limitations  Other (comment);House hold activities    Patient Stated Goals  be able to return to pickleball without stiffness and vertigo    Currently in Pain?  No/denies                       [facility] Adult PT Treatment/Exercise - 06/14/17 0001      Self-Care   Other Self-Care Comments   reviewed HEP as seen in chart      Neck Exercises: Seated   Other Seated Exercise  cervical ROM rotation, extension/flexion      Traction   Type of Traction  Cervical    Min (lbs)  5    Max (lbs)  17    Hold Time  60    Rest Time  10    Time  13               PT Short Term Goals - 03/13/17 1313      PT SHORT TERM GOAL #1   Title  pt ind with initial HEP    Time  4    Period  Weeks    Status  Achieved      PT SHORT TERM GOAL #2   Title  pt will be able to perform bed mobility without using hands to support head    Time  4    Period  Weeks    Status  Achieved        PT Long Term Goals - 06/14/17 1247      PT LONG TERM GOAL #1   Title  pt will be ind with advanced HEP    Time  8    Period  Weeks    Status  On-going    Target Date  07/26/17      PT LONG TERM GOAL #2   Title  pt will be able to get out of bed without increased symptoms of dizziness    Baseline  feels 80-85% better    Time  6    Period  Weeks  Status  On-going    Target Date  07/26/17      PT LONG TERM GOAL #3   Title  FOTO < or = to 34% limited    Baseline  37%    Period  Weeks    Status  On-going    Target Date  07/26/17      PT LONG TERM GOAL #4   Title  pt demonstrates improved cervical rotation to 65 deg bilaterally for improved ability to navigate traffic while driving    Baseline  70 deg right; 75 deg  left    Time  8    Period  Weeks    Status  Achieved      PT LONG TERM GOAL #5   Title  Pt will be able to manage acute symptoms of dizziness using BBQ and eply maneuvers as needed    Time  6    Period  Weeks    Status  Achieved      Additional Long Term Goals   Additional Long Term Goals  Yes      PT LONG TERM GOAL #6   Title  Pt will be able to play 7 competative pickleball matches with an 80% confidence level due to managed vertigo symptoms    Baseline  50% confidence 06/14/17    Time  6    Period  Weeks    Status  New    Target Date  07/26/17      PT LONG TERM GOAL #7   Title  pt will report no greater 4/10 symptoms of vertigo and neck stiffness    Baseline  6/10 is a bad day and 2/10 is a good day     Time  6    Period  Weeks    Status  New    Target Date  07/26/17            Plan - 06/14/17 1328    Clinical Impression Statement  Pt has made good and consistent progress with cervicogenic dizziness symptoms with traction and manaual  techniques.  she had a setback where she could not perform exercises and maneuvers due to cataract surgery that did not have the expected outcomes.  Pt will benefit from skilled PT to continue to work towards functional goals so she can return to functional and recreational activities and maintain her active and healthy lifestyle.    Rehab Potential  Good    PT Frequency  1x / week    PT Duration  6 weeks    PT Treatment/Interventions  ADLs/Self Care Home Management;Biofeedback;Cryotherapy;Electrical Stimulation;Moist Heat;Traction;Ultrasound;Gait training;Stair training;Functional mobility training;Therapeutic activities;Therapeutic exercise;Balance training;Neuromuscular re-education;Patient/family education;Manual techniques;Passive range of motion;Dry needling;Taping;Vestibular;Canalith Repostioning    PT Next Visit Plan  MFR, thoracic extension, STM to shoulder girdle and upper thoracic region for improved mobility, cervical traction, progress exercises as tolerated, cervical rotation and flex/ext with and without gaze fixed single leg    Recommended Other Services  re-cert order filed 7/56/43    Consulted and Agree with Plan of Care  Patient       Patient will benefit from skilled therapeutic intervention in order to improve the following deficits and impairments:  Pain, Postural dysfunction, Dizziness, Increased muscle spasms  Visit Diagnosis: Cervicalgia  Other muscle spasm     Problem List Patient Active Problem List   Diagnosis Date Noted  . Hyperglycemia 03/06/2017  . GERD (gastroesophageal reflux disease) 04/20/2016  . Numbness 08/06/2015  . Osteopenia 12/10/2014  . Allergic urticaria  12/10/2014  . Pressure in head 04/01/2014  . Dizziness 02/10/2014  . Elevated blood pressure reading 11/06/2013  . Hypothyroid   . Primary HSV infection of mouth     Zannie Cove, PT 06/14/2017, 1:36 PM  [facility] [address], [place], [place], [address] Phone: [phone]   Fax:  [phone]  Name: Tina Lucas MRN: [id number] Date of Birth: [birth year]

## 2017-06-20 ENCOUNTER — Ambulatory Visit: Payer: Medicare Other | Admitting: Physical Therapy

## 2017-06-20 DIAGNOSIS — M62838 Other muscle spasm: Secondary | ICD-10-CM

## 2017-06-20 DIAGNOSIS — M542 Cervicalgia: Secondary | ICD-10-CM | POA: Diagnosis not present

## 2017-06-20 NOTE — Therapy (Signed)
[facility] [address], [place], [place], [address] Phone: [phone]   Fax:  [phone]  Physical Therapy Treatment  Patient Details  Name: Tina Lucas MRN: [id number] Date of Birth: [birth year] Referring Provider: Binnie Rail, MD   Encounter Date: 06/20/2017  PT End of Session - 06/20/17 1503    Visit Number  17    Number of Visits  20    Date for PT Re-Evaluation  07/26/17    Authorization Type  medicare; use KX    PT Start Time  1937    PT Stop Time  1457    PT Time Calculation (min)  52 min    Activity Tolerance  Patient tolerated treatment well    Behavior During Therapy  [facility] for tasks assessed/performed       Past Medical History:  Diagnosis Date  . Allergic rhinitis   . History of asthma 1956-2000  . Hypothyroid 2013  . Primary HSV infection of mouth 2008  . 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 TONSILLECTOMY  1958    There were no vitals filed for this visit.  Subjective Assessment - 06/20/17 1506    Subjective  I had to go to the eye doctor.  I have been feeling a little more dizzy with my eye being worse.  I'm not sure if it is the eye or playing pickleball that is causing the problem now, there are so many variables.    Patient Stated Goals  be able to return to pickleball without stiffness and vertigo    Currently in Pain?  No/denies                       [facility] Adult PT Treatment/Exercise - 06/20/17 0001      Traction   Type of Traction  Cervical    Min (lbs)  5    Max (lbs)  17    Hold Time  60    Rest Time  10    Time  15      Manual Therapy   Myofascial Release  cervical and cranial MFR, suboccipitals, cervical paraspinals               PT Short Term Goals - 03/13/17 1313      PT SHORT TERM GOAL #1   Title  pt ind with initial HEP     Time  4    Period  Weeks    Status  Achieved      PT SHORT TERM GOAL #2   Title  pt will be able to perform bed mobility without using hands to support head    Time  4    Period  Weeks    Status  Achieved        PT Long Term Goals - 06/14/17 1247      PT LONG TERM GOAL #1   Title  pt will be ind with advanced HEP    Time  8    Period  Weeks    Status  On-going    Target Date  07/26/17      PT LONG TERM GOAL #2   Title  pt will be able to get out of bed without increased symptoms of dizziness    Baseline  feels 80-85% better    Time  6    Period  Weeks  Status  On-going    Target Date  07/26/17      PT LONG TERM GOAL #3   Title  FOTO < or = to 34% limited    Baseline  37%    Period  Weeks    Status  On-going    Target Date  07/26/17      PT LONG TERM GOAL #4   Title  pt demonstrates improved cervical rotation to 65 deg bilaterally for improved ability to navigate traffic while driving    Baseline  70 deg right; 75 deg  left    Time  8    Period  Weeks    Status  Achieved      PT LONG TERM GOAL #5   Title  Pt will be able to manage acute symptoms of dizziness using BBQ and eply maneuvers as needed    Time  6    Period  Weeks    Status  Achieved      Additional Long Term Goals   Additional Long Term Goals  Yes      PT LONG TERM GOAL #6   Title  Pt will be able to play 7 competative pickleball matches with an 80% confidence level due to managed vertigo symptoms    Baseline  50% confidence 06/14/17    Time  6    Period  Weeks    Status  New    Target Date  07/26/17      PT LONG TERM GOAL #7   Title  pt will report no greater 4/10 symptoms of vertigo and neck stiffness    Baseline  6/10 is a bad day and 2/10 is a good day     Time  6    Period  Weeks    Status  New    Target Date  07/26/17            Plan - 06/20/17 1504    Clinical Impression Statement  Pt was having more muscle spasms since eye doctor appointment today.  She was feeling  tightness in theback of the head.  Pt reports reduced symtpoms after the myofascial release and was able to turn her head more easily.  Pt will benefit from skilled PT to addres soft tissue impairments and return to recreational activities.    PT Treatment/Interventions  ADLs/Self Care Home Management;Biofeedback;Cryotherapy;Electrical Stimulation;Moist Heat;Traction;Ultrasound;Gait training;Stair training;Functional mobility training;Therapeutic activities;Therapeutic exercise;Balance training;Neuromuscular re-education;Patient/family education;Manual techniques;Passive range of motion;Dry needling;Taping;Vestibular;Canalith Repostioning    PT Next Visit Plan  MFR, thoracic extension, STM to shoulder girdle and upper thoracic region for improved mobility, cervical traction, progress exercises as tolerated, cervical rotation and flex/ext with and without gaze fixed single leg    PT Home Exercise Plan  progress as needed; patient likes to have pictures    Consulted and Agree with Plan of Care  Patient       Patient will benefit from skilled therapeutic intervention in order to improve the following deficits and impairments:  Pain, Postural dysfunction, Dizziness, Increased muscle spasms  Visit Diagnosis: Cervicalgia  Other muscle spasm     Problem List Patient Active Problem List   Diagnosis Date Noted  . Hyperglycemia 03/06/2017  . GERD (gastroesophageal reflux disease) 04/20/2016  . Numbness 08/06/2015  . Osteopenia 12/10/2014  . Allergic urticaria 12/10/2014  . Pressure in head 04/01/2014  . Dizziness 02/10/2014  . Elevated blood pressure reading 11/06/2013  . Hypothyroid   . Primary HSV infection of mouth  Zannie Cove, PT 06/20/2017, 3:10 PM  [facility] [address], [place], [place], [address] Phone: [phone]   Fax:  [phone]  Name: Daaiyah Baumert MRN: [id number] Date of Birth: 1950-12-09

## 2017-06-28 ENCOUNTER — Ambulatory Visit: Payer: Medicare Other | Attending: Internal Medicine | Admitting: Physical Therapy

## 2017-06-28 ENCOUNTER — Encounter: Payer: Self-pay | Admitting: Physical Therapy

## 2017-06-28 DIAGNOSIS — M62838 Other muscle spasm: Secondary | ICD-10-CM | POA: Diagnosis present

## 2017-06-28 DIAGNOSIS — M542 Cervicalgia: Secondary | ICD-10-CM | POA: Diagnosis not present

## 2017-06-28 NOTE — Therapy (Signed)
[facility] [address], [place], [place], [address] Phone: [phone]   Fax:  [phone]  Physical Therapy Treatment  Patient Details  Name: Tina Lucas MRN: [id number] Date of Birth: 02-27-1950 Referring Provider: Binnie Rail, MD   Encounter Date: 06/28/2017  PT End of Session - 06/28/17 1257    Visit Number  18    Number of Visits  20    Date for PT Re-Evaluation  07/26/17    Authorization Type  medicare; use KX    PT Start Time  1233    PT Stop Time  1313    PT Time Calculation (min)  40 min    Activity Tolerance  Patient tolerated treatment well    Behavior During Therapy  [facility] for tasks assessed/performed       Past Medical History:  Diagnosis Date  . Allergic rhinitis   . 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 History of asthma 1956-2000  . Hypothyroid 2013  . Primary HSV infection of mouth 2008  . 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 TONSILLECTOMY  1958    There were no vitals filed for this visit.  Subjective Assessment - 06/28/17 1243    Subjective  I felt like the treatment last time did well.  I played pickelball.  I don't have to pause as long after getting up.     Currently in Pain?  No/denies                       [facility] Adult PT Treatment/Exercise - 06/28/17 0001      Traction   Type of Traction  Cervical    Min (lbs)  5    Max (lbs)  17    Hold Time  60    Rest Time  10    Time  15      Manual Therapy   Myofascial Release  cervical and cranial MFR, suboccipitals, cervical paraspinals               PT Short Term Goals - 03/13/17 1313      PT SHORT TERM GOAL #1   Title  pt ind with initial HEP    Time  4    Period  Weeks    Status  Achieved      PT SHORT TERM GOAL #2   Title  pt will be able to  perform bed mobility without using hands to support head    Time  4    Period  Weeks    Status  Achieved        PT Long Term Goals - 06/28/17 1244      PT LONG TERM GOAL #1   Title  pt will be ind with advanced HEP    Time  6    Period  Weeks    Status  On-going      PT LONG TERM GOAL #2   Title  pt will be able to get out of bed without increased symptoms of dizziness    Time  6    Period  Weeks    Status  On-going      PT LONG TERM GOAL #3   Title  FOTO < or = to 34% limited    Time  6    Period  Weeks  Status  On-going      PT LONG TERM GOAL #6   Title  Pt will be able to play 7 competative pickleball matches with an 80% confidence level due to managed vertigo symptoms    Baseline  steadily there for 3 hours    Time  6    Period  Weeks    Status  On-going      PT LONG TERM GOAL #7   Title  pt will report no greater 4/10 symptoms of vertigo and neck stiffness    Baseline  has not had much vertigo this last week 1/10    Time  6    Period  Weeks    Status  On-going            Plan - 06/28/17 1257    Clinical Impression Statement  Pt responds well to myofascial release.  Pt has shown good improvements this last week with minimal vertigo symptoms.  Pt will benefit from skilled PT to work on soft tissue adhesions so she is able to fully participate in all activities.    PT Treatment/Interventions  ADLs/Self Care Home Management;Biofeedback;Cryotherapy;Electrical Stimulation;Moist Heat;Traction;Ultrasound;Gait training;Stair training;Functional mobility training;Therapeutic activities;Therapeutic exercise;Balance training;Neuromuscular re-education;Patient/family education;Manual techniques;Passive range of motion;Dry needling;Taping;Vestibular;Canalith Repostioning    PT Next Visit Plan  MFR cervcial and suboccipitals, thoracic extension, STM to shoulder girdle and upper thoracic region for improved mobility, cervical traction, progress exercises as tolerated,  cervical rotation and flex/ext with and without gaze fixed single leg    Consulted and Agree with Plan of Care  Patient       Patient will benefit from skilled therapeutic intervention in order to improve the following deficits and impairments:  Pain, Postural dysfunction, Dizziness, Increased muscle spasms  Visit Diagnosis: Cervicalgia  Other muscle spasm     Problem List Patient Active Problem List   Diagnosis Date Noted  . Hyperglycemia 03/06/2017  . GERD (gastroesophageal reflux disease) 04/20/2016  . Numbness 08/06/2015  . Osteopenia 12/10/2014  . Allergic urticaria 12/10/2014  . Pressure in head 04/01/2014  . Dizziness 02/10/2014  . Elevated blood pressure reading 11/06/2013  . Hypothyroid   . Primary HSV infection of mouth     Zannie Cove, PT 06/28/2017, 1:31 PM  [facility] [address] [place], [place], [address] Phone: [phone]   Fax:  [phone]  Name: Tina Lucas MRN: [id number] Date of Birth: 1950/09/28

## 2017-06-29 ENCOUNTER — Encounter: Payer: Self-pay | Admitting: Family

## 2017-07-04 ENCOUNTER — Ambulatory Visit: Payer: Medicare Other | Admitting: Physical Therapy

## 2017-07-04 DIAGNOSIS — M542 Cervicalgia: Secondary | ICD-10-CM

## 2017-07-04 DIAGNOSIS — M62838 Other muscle spasm: Secondary | ICD-10-CM

## 2017-07-04 NOTE — Therapy (Signed)
[facility] [address] [place], [place], [address] Phone: [phone]   Fax:  [phone]  Physical Therapy Treatment  Patient Details  Name: Tina Lucas MRN: [id number] Date of Birth: 1950/09/24 Referring Provider: Binnie Rail, MD   Encounter Date: 07/04/2017  PT End of Session - 07/04/17 1246    Visit Number  19    Number of Visits  20    Date for PT Re-Evaluation  07/26/17    Authorization Type  medicare; use KX    PT Start Time  1232    PT Stop Time  1313    PT Time Calculation (min)  41 min    Activity Tolerance  Patient tolerated treatment well    Behavior During Therapy  [facility] for tasks assessed/performed       Past Medical History:  Diagnosis Date  . Allergic rhinitis   . 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 History of asthma 1956-2000  . Hypothyroid 2013  . Primary HSV infection of mouth 2008  . 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 TONSILLECTOMY  1958    There were no vitals filed for this visit.  Subjective Assessment - 07/04/17 1239    Subjective  I noticed that turning to look behind me especially to the right causes me to have more dizziness.  I had an episode when rolling to the right when I was in bed last night.    Limitations  Other (comment);House hold activities    Patient Stated Goals  be able to return to pickleball without stiffness and vertigo    Currently in Pain?  No/denies                       [facility] Adult PT Treatment/Exercise - 07/04/17 0001      Traction   Type of Traction  Cervical    Min (lbs)  5    Max (lbs)  17    Hold Time  60    Rest Time  10    Time  15      Manual Therapy   Myofascial Release  cervical and cranial MFR, suboccipitals, cervical paraspinals      STM to right rhomboids and  pecs, SCM         PT Short Term Goals - 03/13/17 1313      PT SHORT TERM GOAL #1   Title  pt ind with initial HEP    Time  4    Period  Weeks    Status  Achieved      PT SHORT TERM GOAL #2   Title  pt will be able to perform bed mobility without using hands to support head    Time  4    Period  Weeks    Status  Achieved        PT Long Term Goals - 06/28/17 1244      PT LONG TERM GOAL #1   Title  pt will be ind with advanced HEP    Time  6    Period  Weeks    Status  On-going      PT LONG TERM GOAL #2   Title  pt will be able to get out of bed without increased symptoms of dizziness    Time  6  Period  Weeks    Status  On-going      PT LONG TERM GOAL #3   Title  FOTO < or = to 34% limited    Time  6    Period  Weeks    Status  On-going      PT LONG TERM GOAL #6   Title  Pt will be able to play 7 competative pickleball matches with an 80% confidence level due to managed vertigo symptoms    Baseline  steadily there for 3 hours    Time  6    Period  Weeks    Status  On-going      PT LONG TERM GOAL #7   Title  pt will report no greater 4/10 symptoms of vertigo and neck stiffness    Baseline  has not had much vertigo this last week 1/10    Time  6    Period  Weeks    Status  On-going            Plan - 07/04/17 1247    Clinical Impression Statement  Pt continues to respond well to treatment and progress is noticed with working out fascial restrictions.  Pt was more tight along perscp and upper traps on right side.  She does not tolerate a lot of pressure but gets a good release with gentle myofascial release  pt will benefit from skilled PT to cotninue working to soft tissue release for decreased symptoms of cervicogenic dizziness.    PT Treatment/Interventions  ADLs/Self Care Home Management;Biofeedback;Cryotherapy;Electrical Stimulation;Moist Heat;Traction;Ultrasound;Gait training;Stair training;Functional mobility training;Therapeutic  activities;Therapeutic exercise;Balance training;Neuromuscular re-education;Patient/family education;Manual techniques;Passive range of motion;Dry needling;Taping;Vestibular;Canalith Repostioning    PT Next Visit Plan  discharge next most likely, MFR and traction, re-assess    Consulted and Agree with Plan of Care  Patient       Patient will benefit from skilled therapeutic intervention in order to improve the following deficits and impairments:  Pain, Postural dysfunction, Dizziness, Increased muscle spasms  Visit Diagnosis: Cervicalgia  Other muscle spasm     Problem List Patient Active Problem List   Diagnosis Date Noted  . Hyperglycemia 03/06/2017  . GERD (gastroesophageal reflux disease) 04/20/2016  . Numbness 08/06/2015  . Osteopenia 12/10/2014  . Allergic urticaria 12/10/2014  . Pressure in head 04/01/2014  . Dizziness 02/10/2014  . Elevated blood pressure reading 11/06/2013  . Hypothyroid   . Primary HSV infection of mouth     Zannie Cove, PT 07/04/2017, 1:00 PM  [facility] [address], [place], [place], [address] Phone: [phone]   Fax:  [phone]  Name: Tina Lucas MRN: [id number] Date of Birth: [birth year]

## 2017-07-05 ENCOUNTER — Encounter: Payer: Self-pay | Admitting: Family

## 2017-07-05 ENCOUNTER — Telehealth: Payer: Self-pay

## 2017-07-05 NOTE — Telephone Encounter (Signed)
(  FYI)  Patient sent following e-mail while you were out. Just wanted to send this to you as an FYI incase anything ever came back or happened.  ---- Message -----    From: Tina Lucas    Sent: 07/05/2017 10:27 AM EDT      To: Marrian Salvage, FNP Subject: Non-Urgent Medical Question  I had an appt with You on April 22,2019. Dr. Quay Burow was on vacation that week. Heartburn issues that I requested a prescription (Omeprazole DR40mg ) which I had received last year from Dr.Burns. I only took 3 pills( Medicine didn't agree with me.).Marland KitchenMarland Kitchenbut problem resolved itself & I was FINE!  I had cataract surgery on April 30... A defective lens was placed in my eye that required extra Surgery & I have been dealing with that Issue. I DON'T wish to have an endoscopy at this time . Dr. Quay Burow KNOWS how conscientious I am about My health & fitness so I will consult her when I feel I need this procedure. Thanks for your concern. Sincerely, Mrs. Tina Lucas  (My response)  Currently Mickel Baas is out of the office today and tomorrow. I will pass this message along to her as an FYI but this should be fine if you wish to hold off for now. Please make sure you however do follow up with Dr. Quay Burow regarding this matter should your symptoms return.

## 2017-07-12 ENCOUNTER — Ambulatory Visit: Payer: Medicare Other | Admitting: Physical Therapy

## 2017-07-12 ENCOUNTER — Encounter: Payer: Self-pay | Admitting: Physical Therapy

## 2017-07-12 DIAGNOSIS — M542 Cervicalgia: Secondary | ICD-10-CM | POA: Diagnosis not present

## 2017-07-12 DIAGNOSIS — M62838 Other muscle spasm: Secondary | ICD-10-CM

## 2017-07-12 NOTE — Therapy (Signed)
[facility] [address] [place], [place], [address] Phone: [phone]   Fax:  [phone]  Physical Therapy Treatment  Patient Details  Name: Tina Lucas MRN: [id number] Date of Birth: Feb 12, 1950 Referring Provider: Binnie Rail, MD   Encounter Date: 07/12/2017  PT End of Session - 07/12/17 1244    Visit Number  20    Number of Visits  20    Date for PT Re-Evaluation  07/26/17    Authorization Type  medicare; use KX    PT Start Time  2620    PT Stop Time  1317    PT Time Calculation (min)  42 min    Activity Tolerance  Patient tolerated treatment well    Behavior During Therapy  [facility] for tasks assessed/performed       Past Medical History:  Diagnosis Date  . Allergic rhinitis   . 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 History of asthma 1956-2000  . Hypothyroid 2013  . Primary HSV infection of mouth 2008  . 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 TONSILLECTOMY  1958    There were no vitals filed for this visit.  Subjective Assessment - 07/12/17 1352    Subjective  Pt reports overall feeling better and was able to manage an increase in pain in the back of the head with the BBQ maneuver.  Pt states she is a little more dizzy today but not bad and just came from pickelball    Patient Stated Goals  be able to return to pickleball without stiffness and vertigo    Currently in Pain?  No/denies                       [facility] Adult PT Treatment/Exercise - 07/12/17 0001      Traction   Type of Traction  Cervical    Min (lbs)  5    Max (lbs)  17    Hold Time  60    Rest Time  10    Time  15      Manual Therapy   Myofascial Release  cervical and cranial MFR, suboccipitals, cervical paraspinals               PT Short Term Goals - 03/13/17  1313      PT SHORT TERM GOAL #1   Title  pt ind with initial HEP    Time  4    Period  Weeks    Status  Achieved      PT SHORT TERM GOAL #2   Title  pt will be able to perform bed mobility without using hands to support head    Time  4    Period  Weeks    Status  Achieved        PT Long Term Goals - 07/12/17 1249      PT LONG TERM GOAL #1   Title  pt will be ind with advanced HEP    Time  6    Period  Weeks    Status  Achieved      PT LONG TERM GOAL #2   Title  pt will be able to get out of bed without increased symptoms of dizziness    Time  6    Period  Weeks    Status  Partially Met      PT LONG TERM GOAL #3   Title  FOTO < or = to 34% limited    Baseline  42%    Time  6    Period  Weeks    Status  Not Met      PT LONG TERM GOAL #4   Title  pt demonstrates improved cervical rotation to 65 deg bilaterally for improved ability to navigate traffic while driving    Baseline  70 deg right; 75 deg  left    Time  8    Period  Weeks    Status  Achieved      PT LONG TERM GOAL #5   Title  Pt will be able to manage acute symptoms of dizziness using BBQ and eply maneuvers as needed    Status  Achieved      PT LONG TERM GOAL #6   Title  Pt will be able to play 7 competative pickleball matches with an 80% confidence level due to managed vertigo symptoms    Time  6    Status  Achieved      PT LONG TERM GOAL #7   Title  pt will report no greater 4/10 symptoms of vertigo and neck stiffness    Baseline  still low this week    Time  6    Period  Weeks    Status  Achieved            Plan - 07/12/17 1355    Clinical Impression Statement  Pt is ind with HEP at this time and able to return to full activities. She continues to have some dizziness every so often, but is much better.  Pt has met most of her goals as shown above..  She is recommended to d/c from PT with HEP today.    PT Treatment/Interventions  ADLs/Self Care Home  Management;Biofeedback;Cryotherapy;Electrical Stimulation;Moist Heat;Traction;Ultrasound;Gait training;Stair training;Functional mobility training;Therapeutic activities;Therapeutic exercise;Balance training;Neuromuscular re-education;Patient/family education;Manual techniques;Passive range of motion;Dry needling;Taping;Vestibular;Canalith Repostioning    PT Next Visit Plan  discharged today    PT Home Exercise Plan  progress as needed; patient likes to have pictures    Consulted and Agree with Plan of Care  Patient       Patient will benefit from skilled therapeutic intervention in order to improve the following deficits and impairments:  Pain, Postural dysfunction, Dizziness, Increased muscle spasms  Visit Diagnosis: Cervicalgia  Other muscle spasm     Problem List Patient Active Problem List   Diagnosis Date Noted  . Hyperglycemia 03/06/2017  . GERD (gastroesophageal reflux disease) 04/20/2016  . Numbness 08/06/2015  . Osteopenia 12/10/2014  . Allergic urticaria 12/10/2014  . Pressure in head 04/01/2014  . Dizziness 02/10/2014  . Elevated blood pressure reading 11/06/2013  . Hypothyroid   . Primary HSV infection of mouth     Zannie Cove, PT 07/12/2017, 5:34 PM  [facility] [address] [place], [place], [address] Phone: [phone]   Fax:  [phone]  Name: Tina Lucas MRN: [id number] Date of Birth: [birth year]  PHYSICAL THERAPY DISCHARGE SUMMARY  Visits from Start of Care: 20  Current functional level related to goals / functional outcomes: See above   Remaining deficits: See above   Education / Equipment: HEP  Plan: Patient agrees to discharge.  Patient goals were partially met. Patient is being discharged due to being pleased with the current functional level.  ?????    Zannie Cove, PT  07/12/17 5:35 PM

## 2017-08-26 NOTE — Progress Notes (Signed)
Subjective:    Patient ID: Tina Lucas, female    DOB: Jun 12, 1950, 67 y.o.   MRN: [id number]  HPI The patient is here for follow up.  B/l cataract surgery - one in April with defentve lens, then left eye in June.  She is still having issues with her eyes and this is caused a lot of increased stress.  She did end up changing eye doctors and feels like everything is moving in the right direction.  She has needed to use eyedrops for prolonged period of time, especially Pred forte.  Started having a sore throat - she can not remember the last time she had a sore throat.  It started July 25th.  She also had a dry cough.  She gargled with warm salt water 2-3 times a day - it helped.  She saw a little bit of green but it was not heavy.  She took delsym for a few nights.  Yesterday she was fine.  Later in the afternoon she started to have sore throat.  It feels better today.  She notices the throat when she swallows.  She also had a little bit of blood from her right nostril and it feels irritated.  She was unsure if it was related to the eyedrops or if she had an infection..   She states very mild nasal congestion, rhinorrhea and a mild dry cough.  She denies any significant sinus pain, pressure or fevers.  She denies any wheezing or shortness of breath.    Vertigo, neck pain:   She needs a referral for PT.  She has vertigo that is BPPV and cervical vertigo in nature.  Her last PT was in June.  She feels she is doing much better, but because of the chronic arthritis and neck pain she wants to do some maintenance physical therapy.  Medications and allergies reviewed with patient and updated if appropriate.  Patient Active Problem List   Diagnosis Date Noted  . Hyperglycemia 03/06/2017  . GERD (gastroesophageal reflux disease) 04/20/2016  . Numbness 08/06/2015  . Osteopenia 12/10/2014  . Allergic urticaria 12/10/2014  . Pressure in head 04/01/2014  . Dizziness 02/10/2014  . Elevated blood  pressure reading 11/06/2013  . Hypothyroid   . Primary HSV infection of mouth     Current Outpatient Medications on File Prior to Visit  Medication Sig Dispense Refill  . Calcium Carbonate-Vitamin D (CALTRATE 600+D) 600-400 MG-UNIT per tablet Take 1 tablet by mouth daily.    . celecoxib (CELEBREX) 100 MG capsule TAKE ONE CAPSULE BY MOUTH TWICE A DAY (Patient taking differently: TAKE ONE CAPSULE BY MOUTH ONCE A DAY) 60 capsule 4  . cholecalciferol (VITAMIN D) 400 UNITS TABS tablet Take 400 Units by mouth.    . fexofenadine (ALLEGRA) 180 MG tablet Take 180 mg by mouth daily.    . fluocinonide cream (LIDEX) 2.95 % Apply 1 application topically 2 (two) times daily as needed (FOR PSORIASIS).     Marland Kitchen levothyroxine (SYNTHROID, LEVOTHROID) 50 MCG tablet Take 1 tablet (50 mcg total) by mouth daily before breakfast. 90 tablet 3  . Magnesium 250 MG TABS Take 125 mg by mouth daily.    . Multiple Vitamins-Minerals (ONE-A-DAY WOMENS 50+ ADVANTAGE PO) Take by mouth daily.    . prednisoLONE acetate (PRED FORTE) 1 % ophthalmic suspension     . Probiotic Product (PROBIOTIC DAILY) CAPS Take 1 capsule by mouth daily.     No current facility-administered medications on file prior to visit.  Past Medical History:  Diagnosis Date  . Allergic rhinitis   . 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 History of asthma 1956-2000  . Hypothyroid 2013  . Primary HSV infection of mouth 2008  . 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 TONSILLECTOMY  1958    Social History   Socioeconomic History  . Marital status: Married    Spouse name: Not on file  . Number of children: Not on file  . Years of education: Not on file  . Highest education level: Not on file  Occupational History  . Not on file  Social Needs  . Financial resource strain: Not on file  . Food  insecurity:    Worry: Not on file    Inability: Not on file  . Transportation needs:    Medical: Not on file    Non-medical: Not on file  Tobacco Use  . Smoking status: Never Smoker  . Smokeless tobacco: Never Used  Substance and Sexual Activity  . Alcohol use: Yes    Alcohol/week: 0.0 oz  . Drug use: No  . Sexual activity: Not on file  Lifestyle  . Physical activity:    Days per week: Not on file    Minutes per session: Not on file  . Stress: Not on file  Relationships  . Social connections:    Talks on phone: Not on file    Gets together: Not on file    Attends religious service: Not on file    Active member of club or organization: Not on file    Attends meetings of clubs or organizations: Not on file    Relationship status: Not on file  Other Topics Concern  . Not on file  Social History Narrative   Lives with husband in a one story home.  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    Family History  Problem Relation Age of Onset  . Breast cancer Mother 12  . Osteoarthritis Father   . Hypertension Father   . Leukemia Maternal Aunt 77  . Atrial fibrillation Brother     Review of Systems  Constitutional: Negative for chills and fever.  HENT: Positive for congestion (mild), rhinorrhea and sore throat. Negative for sinus pressure and trouble swallowing (mild pain with swallowing).   Respiratory: Positive for cough (dry). Negative for shortness of breath and wheezing.        Objective:   Vitals:   08/27/17 1340  BP: 136/82  Pulse: 70  Resp: 16  Temp: 98.5 F (36.9 C)  SpO2: 98%   BP Readings from Last 3 Encounters:  08/27/17 136/82  05/14/17 140/76  03/07/17 (!) 152/88   Wt Readings from Last 3 Encounters:  08/27/17 127 lb (57.6 kg)  05/14/17 125 lb (56.7 kg)  03/07/17 125 lb (56.7 kg)   Body mass index is 19.31 kg/m.   Physical Exam    GENERAL APPEARANCE: Appears stated age, well appearing, NAD EYES: conjunctiva clear, no  icterus HEENT: bilateral tympanic membranes and ear canals normal, oropharynx with mild erythema, no thyromegaly, trachea midline, no cervical or supraclavicular lymphadenopathy LUNGS: Clear to auscultation without wheeze or crackles, unlabored breathing, good air entry bilaterally CARDIOVASCULAR: Normal S1,S2 without murmurs, no edema SKIN: Warm, dry       Assessment & Plan:    See Problem List for Assessment and Plan of  chronic medical problems.

## 2017-08-27 ENCOUNTER — Encounter: Payer: Self-pay | Admitting: Internal Medicine

## 2017-08-27 ENCOUNTER — Ambulatory Visit: Payer: Medicare Other | Admitting: Internal Medicine

## 2017-08-27 DIAGNOSIS — M542 Cervicalgia: Secondary | ICD-10-CM

## 2017-08-27 DIAGNOSIS — R42 Dizziness and giddiness: Secondary | ICD-10-CM

## 2017-08-27 DIAGNOSIS — G8929 Other chronic pain: Secondary | ICD-10-CM

## 2017-08-27 DIAGNOSIS — J029 Acute pharyngitis, unspecified: Secondary | ICD-10-CM | POA: Diagnosis not present

## 2017-08-27 NOTE — Patient Instructions (Signed)
A referral was ordered for PT.    Try nasal saline spray.  You can use vaseline or aquaphor inside the nose as needed.

## 2017-08-27 NOTE — Assessment & Plan Note (Signed)
Still with mild symptoms-chronic in nature Will refer to PT

## 2017-08-27 NOTE — Assessment & Plan Note (Signed)
Chronic in nature Exacerbates vertigo Refer to PT

## 2017-08-27 NOTE — Assessment & Plan Note (Signed)
Nasal irritation No obvious bacterial infection Her symptoms are likely related to prolonged use of eyedrops, but may also have some allergies or mild viral infection Symptomatic treatment Continue fexofenadine Saline nasal spray or Aquaphor/Vaseline into the nasal area Symptomatic treatment for sore throat Call if no improvement or if symptoms worsen

## 2017-08-29 ENCOUNTER — Ambulatory Visit: Payer: Medicare Other | Admitting: Podiatry

## 2017-08-29 ENCOUNTER — Encounter: Payer: Self-pay | Admitting: Podiatry

## 2017-08-29 DIAGNOSIS — M79675 Pain in left toe(s): Secondary | ICD-10-CM

## 2017-08-29 DIAGNOSIS — B351 Tinea unguium: Secondary | ICD-10-CM

## 2017-08-29 DIAGNOSIS — M79674 Pain in right toe(s): Secondary | ICD-10-CM | POA: Diagnosis not present

## 2017-08-29 DIAGNOSIS — Q828 Other specified congenital malformations of skin: Secondary | ICD-10-CM | POA: Diagnosis not present

## 2017-08-30 NOTE — Progress Notes (Signed)
Subjective:   Patient ID: Tina Lucas, female   DOB: 67 y.o.   MRN: [id number]   HPI Patient presents with elongated nailbeds 1-5 both feet that she cannot cut and they are occasionally tender   ROS      Objective:  Physical Exam  Neurovascular status unchanged with thick yellow brittle nailbeds 1-5 both feet that she cannot take care of and they do get occasionally tender     Assessment:  Mycotic infection with nail disease 1-5 both feet     Plan:  Debride painful nailbeds 1-5 both feet with no iatrogenic bleeding

## 2017-09-12 ENCOUNTER — Ambulatory Visit: Payer: Medicare Other | Attending: Internal Medicine | Admitting: Physical Therapy

## 2017-09-12 ENCOUNTER — Encounter: Payer: Self-pay | Admitting: Internal Medicine

## 2017-09-12 DIAGNOSIS — M542 Cervicalgia: Secondary | ICD-10-CM | POA: Insufficient documentation

## 2017-09-12 DIAGNOSIS — M6281 Muscle weakness (generalized): Principal | ICD-10-CM

## 2017-09-12 DIAGNOSIS — M62838 Other muscle spasm: Secondary | ICD-10-CM | POA: Diagnosis present

## 2017-09-12 DIAGNOSIS — R29898 Other symptoms and signs involving the musculoskeletal system: Secondary | ICD-10-CM

## 2017-09-13 ENCOUNTER — Encounter: Payer: Self-pay | Admitting: Physical Therapy

## 2017-09-13 NOTE — Therapy (Signed)
[facility] [address] [place], [place], [address] Phone: [phone]   Fax:  [phone]  Physical Therapy Evaluation  Patient Details  Name: Tina Lucas MRN: [id number] Date of Birth: 1950/07/19 Referring Provider: Binnie Rail   Encounter Date: 09/12/2017  PT End of Session - 09/13/17 1432    Visit Number  1    Number of Visits  10    Date for PT Re-Evaluation  12/06/17    Authorization Type  UHC medicare    PT Start Time  1102    PT Stop Time  1146    PT Time Calculation (min)  44 min    Activity Tolerance  Patient tolerated treatment well    Behavior During Therapy  [facility] for tasks assessed/performed       Past Medical History:  Diagnosis Date  . Allergic rhinitis   . 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 History of asthma 1956-2000  . Hypothyroid 2013  . Primary HSV infection of mouth 2008  . 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 TONSILLECTOMY  1958    There were no vitals filed for this visit.   Subjective Assessment - 09/13/17 1419    Subjective  My neck is stiff and swollen along the right side.  I have been having dizziness for 5-7 seconds turning right in bed.  I am also having pain in my right thumb.      Pertinent History  chronic vertigo, BPPV    Limitations  House hold activities;Other (comment)   sports   Patient Stated Goals  reduce pain and dizziness to return to pickle ball    Currently in Pain?  No/denies   only pain and dizzy with certain movement        [facility] PT Assessment - 09/13/17 0001      Assessment   Medical Diagnosis  R42 (ICD-10-CM) - Dizziness; M54.2,G89.29 (ICD-10-CM) - Chronic neck pain    Referring Provider  Billey Gosling J    Prior Therapy  Yes      Precautions   Precautions  None      Restrictions   Weight  Bearing Restrictions  No      Balance Screen   Has the patient fallen in the past 6 months  No      [place] residence    Living Arrangements  Spouse/significant other      Prior Function   Level of [place]  Retired    Leisure  Dentist and walking      Cognition   Overall Cognitive Status  Within Functional Limits for tasks assessed      Observation/Other Assessments   Focus on Therapeutic Outcomes (FOTO)   50% limited      Posture/Postural Control   Posture/Postural Control  Postural limitations    Postural Limitations  Rounded Shoulders;Increased thoracic kyphosis;Increased lumbar lordosis;Forward head   scoliosis S- curve     AROM   Cervical Flexion  60    Cervical Extension  65    Cervical - Right Side Bend  35    Cervical - Left Side Bend  30    Cervical - Right Rotation  55    Cervical - Left Rotation  55   +pain on Rt side  Strength   Overall Strength Comments  shoulder MMT 5/5 bilaterally execpt below    Right Shoulder Flexion  4+/5    Right Shoulder Horizontal ABduction  4+/5    Left Shoulder Flexion  4+/5    Left Shoulder Horizontal ABduction  --   4+/5     Palpation   Spinal mobility  Hypomobility of T4-10    Palpation comment  tight pecs, suboccipitals, cervical paraspinals trigger point on Rt side, scalenes, upper traps, levator, thoracic paraspinals (Rt>Lt for all cervical muscles)      Special Tests    Special Tests  Cervical    Cervical Tests  Dictraction      Distraction Test   Findngs  Positive    Comment  reduced symptoms      Ambulation/Gait   Gait Pattern  Within Functional Limits                Objective measurements completed on examination: See above findings.      [facility] Adult PT Treatment/Exercise - 09/13/17 0001      Manual Therapy   Manual Therapy  Myofascial release    Myofascial Release  cervical paraspinals and suboccipitals                PT Short Term Goals - 09/13/17 1435      PT SHORT TERM GOAL #1   Title  pt ind with initial HEP    Time  4    Period  Weeks    Status  New    Target Date  10/10/17      PT SHORT TERM GOAL #2   Title  pt will be able to perform bed mobility without dizziness    Time  4    Period  Weeks    Status  New    Target Date  10/10/17        PT Long Term Goals - 09/13/17 1436      PT LONG TERM GOAL #1   Title  pt will be ind with advanced HEP    Time  8    Period  Weeks    Status  New    Target Date  11/07/17      PT LONG TERM GOAL #2   Title  Pt will report 50% less dizziness during pickle ball games    Time  8    Period  Weeks    Status  New    Target Date  11/07/17      PT LONG TERM GOAL #3   Title  FOTO < or = to 36% limited    Baseline  50%    Time  8    Period  Weeks    Status  New    Target Date  11/07/17      PT LONG TERM GOAL #4   Title  pt demonstrates improved cervical rotation to 65 deg bilaterally for improved ability to navigate traffic while driving    Time  8    Period  Weeks    Status  New    Target Date  11/07/17      PT LONG TERM GOAL #5   Title  Pt will report 50% less muscle spasms in her neck so she can tolerate playing 3 or more games of pickle ball to return to her normal activities.    Time  8    Period  Weeks    Status  New    Target Date  11/07/17             Plan - 09/13/17 1734    Clinical Impression Statement  Pt presents to clinic due to neck pain and dizziness.  Pt has been treated at our clinic for these issues of cervicogenic vertigo.  Since her last episode she has regressed and has had a decline in her normal activities.  Pt demonstrates cervical and UE weaknesses and decreased ROM.  Pt has muscle spasms.  Symptoms of dizziness arise with turning right and looking overhead.  Pt has postural abnormalities as mentioned above.  Pt will benefit from skilled PT to address impairments and return to maximum  functional activities.      History and Personal Factors relevant to plan of care:  chronic pain and dizziness, eye surgeries including one cataract surgery with complications    Clinical Presentation  Evolving    Clinical Presentation due to:  pt has demonstrated regression and progressing symptoms over time    Clinical Decision Making  Moderate    Rehab Potential  Good    Clinical Impairments Affecting Rehab Potential  chonic condition    PT Frequency  2x / week    PT Duration  8 weeks    PT Treatment/Interventions  ADLs/Self Care Home Management;Biofeedback;Cryotherapy;Electrical Stimulation;Iontophoresis 4mg /ml Dexamethasone;Moist Heat;Ultrasound;Therapeutic activities;Therapeutic exercise;Balance training;Neuromuscular re-education;Patient/family education;Manual techniques;Taping;Dry needling;Passive range of motion    PT Next Visit Plan  STM, thoracic mobility, breathing technique    Recommended Other Services  eval 09/13/17    Consulted and Agree with Plan of Care  Patient       Patient will benefit from skilled therapeutic intervention in order to improve the following deficits and impairments:  Pain, Dizziness, Increased fascial restricitons, Decreased strength, Decreased range of motion, Postural dysfunction, Increased muscle spasms  Visit Diagnosis: Cervicalgia  Other muscle spasm     Problem List Patient Active Problem List   Diagnosis Date Noted  . Chronic neck pain 08/27/2017  . Sore throat 08/27/2017  . Hyperglycemia 03/06/2017  . GERD (gastroesophageal reflux disease) 04/20/2016  . Numbness 08/06/2015  . Osteopenia 12/10/2014  . Allergic urticaria 12/10/2014  . Pressure in head 04/01/2014  . Dizziness 02/10/2014  . Elevated blood pressure reading 11/06/2013  . Hypothyroid   . Primary HSV infection of mouth     Zannie Cove, PT 09/13/2017, 5:42 PM  [facility] [address] [place],  [place], [address] Phone: [phone]   Fax:  [phone]  Name: Tina Lucas MRN: [id number] Date of Birth: 1950-01-28

## 2017-09-18 ENCOUNTER — Ambulatory Visit: Payer: Medicare Other | Admitting: Physical Therapy

## 2017-09-18 ENCOUNTER — Encounter: Payer: Self-pay | Admitting: Physical Therapy

## 2017-09-18 DIAGNOSIS — M62838 Other muscle spasm: Secondary | ICD-10-CM

## 2017-09-18 DIAGNOSIS — M542 Cervicalgia: Secondary | ICD-10-CM

## 2017-09-18 NOTE — Addendum Note (Signed)
Addended by: Lovett Calender D on: 09/18/2017 01:20 PM   Modules accepted: Orders

## 2017-09-18 NOTE — Therapy (Signed)
[facility] [address] [place], [place], [address] Phone: [phone]   Fax:  [phone]  Physical Therapy Treatment  Patient Details  Name: Tina Lucas MRN: [id number] Date of Birth: 1950/05/26 Referring Provider: Binnie Rail, MD   Encounter Date: 09/18/2017  PT End of Session - 09/18/17 1102    Visit Number  2    Number of Visits  10    Date for PT Re-Evaluation  12/06/17    Authorization Type  UHC medicare    PT Start Time  1102    PT Stop Time  1145    PT Time Calculation (min)  43 min    Activity Tolerance  Patient tolerated treatment well    Behavior During Therapy  [facility] for tasks assessed/performed       Past Medical History:  Diagnosis Date  . Allergic rhinitis   . 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 History of asthma 1956-2000  . Hypothyroid 2013  . Primary HSV infection of mouth 2008  . 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 TONSILLECTOMY  1958    There were no vitals filed for this visit.  Subjective Assessment - 09/18/17 1108    Subjective  Pt has been having right thumb pain and gripping to play pickle ball as well as when cutting things for food prep such as apple slices. Denies pain presently, but is induced with MMT.    Patient Stated Goals  reduce pain and dizziness to return to pickle ball    Currently in Pain?  No/denies         [facility] PT Assessment - 09/18/17 0001      Assessment   Medical Diagnosis  M62.81 (ICD-10-CM) - Thumb weakness    Referring Provider  Binnie Rail, MD    Prior Therapy  No      Precautions   Precautions  None      Restrictions   Weight Bearing Restrictions  No      Balance Screen   Has the patient fallen in the past 6 months  No      [place] residence    Living Arrangements  Spouse/significant other      Prior Function   Level of [place]  Retired    Leisure  pickel ball and walking      Cognition   Overall Cognitive Status  Within Functional Limits for tasks assessed      ROM / Strength   AROM / PROM / Strength  Strength      AROM   AROM Assessment Site  Thumb    Right/Left Thumb  Right;Left    Right Thumb Opposition  --   abduction 45 deg   Left Thumb Opposition  --   abduction 52 deg     Strength   Right/Left hand  --   Rt thumb MMT abduction painful 4/5   Right Hand Grip (lbs)  58   +painful   Left Hand Grip (lbs)  57      Palpation   Palpation comment  thenar muscles tight and tender, pollicus flexor and abductor tendons irritated and inflamed                   [facility] Adult PT Treatment/Exercise - 09/18/17  0001      Exercises   Exercises  Neck;Hand      Neck Exercises: Seated   Other Seated Exercise  thoracic extension and rotation - 10x 5 sec hold      Hand Exercises   Other Hand Exercises  thumb extension with rubber band      Manual Therapy   Myofascial Release  cervical paraspinals and suboccipitals, SCM and scalenes Rt side             PT Education - 09/18/17 1301    Education Details   Access Code: [email or web address]     Person(s) Educated  Patient    Methods  Explanation;Demonstration;Handout    Comprehension  Verbalized understanding;Returned demonstration       PT Short Term Goals - 09/18/17 1311      PT SHORT TERM GOAL #1   Title  pt ind with initial HEP    Status  On-going      PT SHORT TERM GOAL #2   Title  pt will be able to perform bed mobility without dizziness    Status  On-going      PT SHORT TERM GOAL #3   Title  Pt will demonstrate grip strength on Rt hand 65lb or more without pain    Time  4    Period  Weeks    Status  New    Target Date  10/16/17        PT Long Term Goals - 09/18/17 1309      Additional Long Term Goals    Additional Long Term Goals  Yes      PT LONG TERM GOAL #6   Title  Pt will be able to play 2 consecutive games of pickleball without increased pain in her thumb    Time  8    Period  Weeks    Status  New    Target Date  11/07/17            Plan - 09/18/17 1302    Clinical Impression Statement  Pt presents to PT with another order for thumb weakness.  Re-eval completed and goals updated for additional order.  She demonsrates weakness in right thumb and grip strength.  Pt has pain with MMT of thumb and grip Rt hand and tender to palpation of thenar muscles at the [facility] and MCP joints. She has decreased ROM as mentioned above.  Pt responded well to manual therapy for muscle spasms in cervical region today.  She will benefit from skilled PT to continue to address impairments as mentioned above according to POC.    Rehab Potential  Good    PT Frequency  2x / week    PT Duration  8 weeks    PT Treatment/Interventions  ADLs/Self Care Home Management;Biofeedback;Cryotherapy;Electrical Stimulation;Iontophoresis 4mg /ml Dexamethasone;Moist Heat;Ultrasound;Therapeutic activities;Therapeutic exercise;Balance training;Neuromuscular re-education;Patient/family education;Manual techniques;Taping;Dry needling;Passive range of motion    PT Next Visit Plan  STM, thoracic mobility, breathing technique, wrist and hand strengthening and STM    Consulted and Agree with Plan of Care  Patient       Patient will benefit from skilled therapeutic intervention in order to improve the following deficits and impairments:  Pain, Dizziness, Increased fascial restricitons, Decreased strength, Decreased range of motion, Postural dysfunction, Increased muscle spasms  Visit Diagnosis: Cervicalgia  Other muscle spasm     Problem List Patient Active Problem List   Diagnosis Date Noted  . Chronic neck pain 08/27/2017  . Sore throat 08/27/2017  .  Hyperglycemia 03/06/2017  . GERD (gastroesophageal reflux disease)  04/20/2016  . Numbness 08/06/2015  . Osteopenia 12/10/2014  . Allergic urticaria 12/10/2014  . Pressure in head 04/01/2014  . Dizziness 02/10/2014  . Elevated blood pressure reading 11/06/2013  . Hypothyroid   . Primary HSV infection of mouth     Zannie Cove, PT 09/18/2017, 1:14 PM  [facility] [address] [place], [place], [address] Phone: [phone]   Fax:  [phone]  Name: Raffaela Ladley MRN: [id number] Date of Birth: 1950/04/07

## 2017-09-18 NOTE — Patient Instructions (Signed)
Access Code: [email or web address]  URL: [email or web address]  Date: 09/18/2017  Prepared by: Lovett Calender   Exercises  Finger Exension with Putty - 10 reps - 3 sets - 1x daily - 7x weekly  Seated Thoracic Lumbar Extension - 10 reps - 3 sets - 1x daily - 7x weekly

## 2017-09-21 ENCOUNTER — Ambulatory Visit: Payer: Medicare Other | Admitting: Physical Therapy

## 2017-09-21 ENCOUNTER — Encounter: Payer: Self-pay | Admitting: Physical Therapy

## 2017-09-21 DIAGNOSIS — M542 Cervicalgia: Secondary | ICD-10-CM

## 2017-09-21 DIAGNOSIS — M62838 Other muscle spasm: Secondary | ICD-10-CM

## 2017-09-21 NOTE — Therapy (Signed)
[facility] [address], [place], [place], [address] Phone: [phone]   Fax:  [phone]  Physical Therapy Treatment  Patient Details  Name: Tina Lucas MRN: [id number] Date of Birth: [birth year] Referring Provider: Binnie Rail, MD   Encounter Date: 09/21/2017  PT End of Session - 09/21/17 1016    Visit Number  3    Number of Visits  10    Date for PT Re-Evaluation  12/06/17    Authorization Type  UHC medicare    PT Start Time  0930    PT Stop Time  1015    PT Time Calculation (min)  45 min    Activity Tolerance  Patient tolerated treatment well    Behavior During Therapy  [facility] for tasks assessed/performed       Past Medical History:  Diagnosis Date  . Allergic rhinitis   . 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 History of asthma 1956-2000  . Hypothyroid 2013  . Primary HSV infection of mouth 2008  . 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 TONSILLECTOMY  1958    There were no vitals filed for this visit.  Subjective Assessment - 09/21/17 0935    Currently in Pain?  Yes    Pain Score  5     Pain Location  Hand    Pain Orientation  Right    Pain Descriptors / Indicators  Aching                       [facility] Adult PT Treatment/Exercise - 09/21/17 0001      Self-Care   Self-Care  Other Self-Care Comments    Other Self-Care Comments   ionto information and HEP      Iontophoresis   Type of Iontophoresis  Dexamethasone    Location  right lateral thumb MCP joint    Dose  1.87mL   #1   Time  4-6 hour release      Manual Therapy   Manual Therapy  Soft tissue mobilization    Soft tissue mobilization  wrist extensors and thenar muscle    Myofascial Release  cervical paraspinals and suboccipitals, SCM and scalenes Rt side              PT Education - 09/21/17 1001    Education Details  ionto instruction and instructions on using rice for hand exercise    Person(s) Educated  Patient    Methods  Explanation;Demonstration;Handout;Verbal cues    Comprehension  Verbalized understanding;Returned demonstration       PT Short Term Goals - 09/18/17 1311      PT SHORT TERM GOAL #1   Title  pt ind with initial HEP    Status  On-going      PT SHORT TERM GOAL #2   Title  pt will be able to perform bed mobility without dizziness    Status  On-going      PT SHORT TERM GOAL #3   Title  Pt will demonstrate grip strength on Rt hand 65lb or more without pain    Time  4    Period  Weeks    Status  New    Target Date  10/16/17        PT Long Term Goals - 09/18/17 1309      Additional Long  Term Goals   Additional Long Term Goals  Yes      PT LONG TERM GOAL #6   Title  Pt will be able to play 2 consecutive games of pickleball without increased pain in her thumb    Time  8    Period  Weeks    Status  New    Target Date  11/07/17            Plan - 09/21/17 1122    Clinical Impression Statement  Pt has muscle spasm and trigger points in wrist extensors and thenar muscle on right hand. She was educated in using rice at home for exercises.  Suggested to heat rice in microwave for soothing joints.  Pt continues to have muscle spasm in suboccipitals, cerivcal paraspinals and scalenes..  Pt responded well from treatmnet today and was given information on ionto    PT Treatment/Interventions  ADLs/Self Care Home Management;Biofeedback;Cryotherapy;Electrical Stimulation;Iontophoresis 4mg /ml Dexamethasone;Moist Heat;Ultrasound;Therapeutic activities;Therapeutic exercise;Balance training;Neuromuscular re-education;Patient/family education;Manual techniques;Taping;Dry needling;Passive range of motion    PT Next Visit Plan  STM, thoracic mobility, breathing technique, wrist and hand strengthening and STM, f/u on ionto  #1       Patient will benefit from skilled therapeutic intervention in order to improve the following deficits and impairments:  Pain, Dizziness, Increased fascial restricitons, Decreased strength, Decreased range of motion, Postural dysfunction, Increased muscle spasms  Visit Diagnosis: Cervicalgia  Other muscle spasm     Problem List Patient Active Problem List   Diagnosis Date Noted  . Chronic neck pain 08/27/2017  . Sore throat 08/27/2017  . Hyperglycemia 03/06/2017  . GERD (gastroesophageal reflux disease) 04/20/2016  . Numbness 08/06/2015  . Osteopenia 12/10/2014  . Allergic urticaria 12/10/2014  . Pressure in head 04/01/2014  . Dizziness 02/10/2014  . Elevated blood pressure reading 11/06/2013  . Hypothyroid   . Primary HSV infection of mouth     Zannie Cove, PT 09/21/2017, 11:34 AM  [facility] [address], [place], [place], [address] Phone: [phone]   Fax:  [phone]  Name: Tina Lucas MRN: [id number] Date of Birth: [birth year]

## 2017-09-21 NOTE — Patient Instructions (Addendum)

## 2017-09-25 ENCOUNTER — Ambulatory Visit: Payer: Medicare Other | Attending: Internal Medicine | Admitting: Physical Therapy

## 2017-09-25 ENCOUNTER — Encounter: Payer: Self-pay | Admitting: Physical Therapy

## 2017-09-25 DIAGNOSIS — M62838 Other muscle spasm: Secondary | ICD-10-CM | POA: Diagnosis present

## 2017-09-25 DIAGNOSIS — M542 Cervicalgia: Secondary | ICD-10-CM | POA: Insufficient documentation

## 2017-09-25 NOTE — Therapy (Signed)
[facility] [address], [place], [place], [address] Phone: [phone]   Fax:  [phone]  Physical Therapy Treatment  Patient Details  Name: Tina Lucas MRN: [id number] Date of Birth: 03-23-1950 Referring Provider: Binnie Rail, MD   Encounter Date: 09/25/2017  PT End of Session - 09/25/17 1401    Visit Number  4    Number of Visits  10    Date for PT Re-Evaluation  12/06/17    Authorization Type  UHC medicare    PT Start Time  1401    PT Stop Time  1440    PT Time Calculation (min)  39 min    Activity Tolerance  Patient tolerated treatment well    Behavior During Therapy  [facility] for tasks assessed/performed       Past Medical History:  Diagnosis Date  . Allergic rhinitis   . 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 History of asthma 1956-2000  . Hypothyroid 2013  . Primary HSV infection of mouth 2008  . 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 TONSILLECTOMY  1958    There were no vitals filed for this visit.  Subjective Assessment - 09/25/17 1405    Subjective  Pt states she has not played pickleball.  Yesterday if flared up, not sure why.      Currently in Pain?  Yes    Pain Score  7     Pain Location  Hand    Pain Orientation  Right    Pain Descriptors / Indicators  Aching    Pain Type  Acute pain    Pain Onset  More than a month ago    Pain Frequency  Intermittent    Aggravating Factors   grabbing anything    Effect of Pain on Daily Activities  can't play pickleball    Multiple Pain Sites  No                               PT Education - 09/25/17 1446    Education Details   Access Code: [email or web address]     Person(s) Educated  Patient    Methods  Explanation;Demonstration;Handout    Comprehension  Verbalized understanding;Returned  demonstration       PT Short Term Goals - 09/18/17 1311      PT SHORT TERM GOAL #1   Title  pt ind with initial HEP    Status  On-going      PT SHORT TERM GOAL #2   Title  pt will be able to perform bed mobility without dizziness    Status  On-going      PT SHORT TERM GOAL #3   Title  Pt will demonstrate grip strength on Rt hand 65lb or more without pain    Time  4    Period  Weeks    Status  New    Target Date  10/16/17        PT Long Term Goals - 09/25/17 1413      PT LONG TERM GOAL #1   Title  pt will be ind with advanced HEP    Status  On-going      PT LONG TERM GOAL #2   Title  Pt will report 50% less dizziness during pickle ball games  Baseline  not playing due to hand pain    Status  On-going      PT LONG TERM GOAL #3   Title  FOTO < or = to 36% limited      PT LONG TERM GOAL #5   Title  Pt will report 50% less muscle spasms in her neck so she can tolerate playing 3 or more games of pickle ball to return to her normal activities.    Status  On-going      PT LONG TERM GOAL #6   Title  Pt will be able to play 2 consecutive games of pickleball without increased pain in her thumb    Status  On-going              Patient will benefit from skilled therapeutic intervention in order to improve the following deficits and impairments:     Visit Diagnosis: Cervicalgia  Other muscle spasm     Problem List Patient Active Problem List   Diagnosis Date Noted  . Chronic neck pain 08/27/2017  . Sore throat 08/27/2017  . Hyperglycemia 03/06/2017  . GERD (gastroesophageal reflux disease) 04/20/2016  . Numbness 08/06/2015  . Osteopenia 12/10/2014  . Allergic urticaria 12/10/2014  . Pressure in head 04/01/2014  . Dizziness 02/10/2014  . Elevated blood pressure reading 11/06/2013  . Hypothyroid   . Primary HSV infection of mouth     Zannie Cove, PT 09/25/2017, 5:40 PM  [facility] [address] [place], [place], [address] Phone: [phone]   Fax:  [phone]  Name: Tina Lucas MRN: [id number] Date of Birth: Apr 23, 1950

## 2017-09-25 NOTE — Patient Instructions (Signed)
Access Code: [email or web address]  URL: [email or web address]  Date: 09/25/2017  Prepared by: Lovett Calender   Exercises  Finger Exension with Putty - 10 reps - 3 sets - 1x daily - 7x weekly  Seated Wrist Flexion with Dumbbell - 10 reps - 3 sets - 1x daily - 7x weekly  Seated Wrist Extension with Dumbbell - 10 reps - 3 sets - 1x daily - 7x weekly  Seated Bicep Curls Neutral with Dumbbells - 10 reps - 3 sets - 1x daily - 7x weekly  Seated Thoracic Lumbar Extension - 10 reps - 3 sets - 1x daily - 7x weekly  Seated Thoracic Flexion and Rotation with Arms Crossed - 10 reps - 3 sets - 1x daily - 7x weekly

## 2017-09-26 ENCOUNTER — Ambulatory Visit: Payer: Medicare Other | Admitting: Physical Therapy

## 2017-09-26 DIAGNOSIS — M62838 Other muscle spasm: Secondary | ICD-10-CM

## 2017-09-26 DIAGNOSIS — M542 Cervicalgia: Secondary | ICD-10-CM

## 2017-09-26 NOTE — Therapy (Signed)
[facility] [address], [place], [place], [address] Phone: [phone]   Fax:  [phone]  Physical Therapy Treatment  Patient Details  Name: Tina Lucas MRN: [id number] Date of Birth: 01-28-1950 Referring Provider: Binnie Rail, MD   Encounter Date: 09/26/2017  PT End of Session - 09/26/17 1712    Visit Number  5    Number of Visits  10    Date for PT Re-Evaluation  12/06/17    Authorization Type  UHC medicare    PT Start Time  5093    PT Stop Time  1616    PT Time Calculation (min)  45 min    Activity Tolerance  Patient tolerated treatment well    Behavior During Therapy  [facility] for tasks assessed/performed       Past Medical History:  Diagnosis Date  . Allergic rhinitis   . 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 History of asthma 1956-2000  . Hypothyroid 2013  . Primary HSV infection of mouth 2008  . 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 TONSILLECTOMY  1958    There were no vitals filed for this visit.  Subjective Assessment - 09/26/17 1719    Subjective  Pt has not played pickle ball.  It felt better withthe ionto but still hurts when I do stuff.  Pt states she is feeling tension in her neck that makes it hard to move.    Patient Stated Goals  reduce pain and dizziness to return to pickle ball    Currently in Pain?  No/denies                               PT Education - 09/26/17 1712    Education Details  thumb brace info    Person(s) Educated  Patient    Methods  Explanation;Demonstration;Handout;Verbal cues    Comprehension  Verbalized understanding;Returned demonstration       PT Short Term Goals - 09/18/17 1311      PT SHORT TERM GOAL #1   Title  pt ind with initial HEP    Status  On-going      PT SHORT TERM GOAL #2    Title  pt will be able to perform bed mobility without dizziness    Status  On-going      PT SHORT TERM GOAL #3   Title  Pt will demonstrate grip strength on Rt hand 65lb or more without pain    Time  4    Period  Weeks    Status  New    Target Date  10/16/17        PT Long Term Goals - 09/25/17 1413      PT LONG TERM GOAL #1   Title  pt will be ind with advanced HEP    Status  On-going      PT LONG TERM GOAL #2   Title  Pt will report 50% less dizziness during pickle ball games    Baseline  not playing due to hand pain    Status  On-going      PT LONG TERM GOAL #3   Title  FOTO < or = to 36% limited      PT LONG TERM GOAL #5   Title  Pt will report  50% less muscle spasms in her neck so she can tolerate playing 3 or more games of pickle ball to return to her normal activities.    Status  On-going      PT LONG TERM GOAL #6   Title  Pt will be able to play 2 consecutive games of pickleball without increased pain in her thumb    Status  On-going            Plan - 09/26/17 1713    Clinical Impression Statement  Pt did well with functional exercises today.  She needed minimal cues for wrist position and demonstrates improved body awareness.  She was given a band to practice resistance with racket techniques.  Pt reports relief from ionto.  She continues to have tightness alongside right side of neck and gets relief and more ROM  after manual techniques.  Pt will continue to benefit from skilled PT.     PT Treatment/Interventions  ADLs/Self Care Home Management;Biofeedback;Cryotherapy;Electrical Stimulation;Iontophoresis 4mg /ml Dexamethasone;Moist Heat;Ultrasound;Therapeutic activities;Therapeutic exercise;Balance training;Neuromuscular re-education;Patient/family education;Manual techniques;Taping;Dry needling;Passive range of motion    PT Next Visit Plan  STM, thoracic mobility, breathing technique, wrist and hand strengthening and STM, f/u on ionto #3    PT Home Exercise  Plan  [id number]     Consulted and Agree with Plan of Care  Patient       Patient will benefit from skilled therapeutic intervention in order to improve the following deficits and impairments:  Pain, Dizziness, Increased fascial restricitons, Decreased strength, Decreased range of motion, Postural dysfunction, Increased muscle spasms  Visit Diagnosis: Cervicalgia  Other muscle spasm     Problem List Patient Active Problem List   Diagnosis Date Noted  . Chronic neck pain 08/27/2017  . Sore throat 08/27/2017  . Hyperglycemia 03/06/2017  . GERD (gastroesophageal reflux disease) 04/20/2016  . Numbness 08/06/2015  . Osteopenia 12/10/2014  . Allergic urticaria 12/10/2014  . Pressure in head 04/01/2014  . Dizziness 02/10/2014  . Elevated blood pressure reading 11/06/2013  . Hypothyroid   . Primary HSV infection of mouth     Zannie Cove, PT 09/26/2017, 5:20 PM  [facility] [address], [place], [place], [address] Phone: [phone]   Fax:  [phone]  Name: Tina Lucas MRN: [id number] Date of Birth: Jun 30, 1950

## 2017-09-28 ENCOUNTER — Encounter: Payer: Medicare Other | Admitting: Physical Therapy

## 2017-10-02 ENCOUNTER — Encounter: Payer: Self-pay | Admitting: Physical Therapy

## 2017-10-02 ENCOUNTER — Ambulatory Visit: Payer: Medicare Other | Admitting: Physical Therapy

## 2017-10-02 DIAGNOSIS — M62838 Other muscle spasm: Secondary | ICD-10-CM

## 2017-10-02 DIAGNOSIS — M542 Cervicalgia: Secondary | ICD-10-CM | POA: Diagnosis not present

## 2017-10-02 NOTE — Therapy (Signed)
[facility] [address], [place], [place], [address] Phone: [phone]   Fax:  [phone]  Physical Therapy Treatment  Patient Details  Name: Tina Lucas MRN: [id number] Date of Birth: 1950/04/02 Referring Provider: Binnie Rail, MD   Encounter Date: 10/02/2017  PT End of Session - 10/02/17 1232    Visit Number  6    Number of Visits  10    Date for PT Re-Evaluation  12/06/17    Authorization Type  [employer]HC medicare    PT Start Time  1232    PT Stop Time  1318    PT Time Calculation (min)  46 min    Activity Tolerance  Patient tolerated treatment well    Behavior During Therapy  [facility] for tasks assessed/performed       Past Medical History:  Diagnosis Date  . Allergic rhinitis   . 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 History of asthma 1956-2000  . Hypothyroid 2013  . Primary HSV infection of mouth 2008  . 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 TONSILLECTOMY  1958    There were no vitals filed for this visit.  Subjective Assessment - 10/02/17 1357    Subjective  Pt states she has not played pickle ball and her thumb is feeling better.  The bands felt like hurt her back around the bra line.  Denies pain currently    Currently in Pain?  No/denies                       [facility] Adult PT Treatment/Exercise - 10/02/17 0001      Self-Care   Other Self-Care Comments   educated on using semi-firm ball to do self massage on thenar muscles      Neck Exercises: Seated   Other Seated Exercise  thoracic extension - 10x 5 sec hold      Wrist Exercises   Other wrist exercises  supination and pronation - 2 lb weight x 5 with increased pain in thumb; no weight x 15; pt demonstrating pickle ball with paddle and ball against the wall      Iontophoresis   Type  of Iontophoresis  Dexamethasone    Location  right lateral thumb MCP joint    Dose  1.68mL   #3   Time  4-6 hour release      Manual Therapy   Soft tissue mobilization  wrist extensors, flexors and thenar muscles; thoracic paraspinals Rt T10-12               PT Short Term Goals - 09/18/17 1311      PT SHORT TERM GOAL #1   Title  pt ind with initial HEP    Status  On-going      PT SHORT TERM GOAL #2   Title  pt will be able to perform bed mobility without dizziness    Status  On-going      PT SHORT TERM GOAL #3   Title  Pt will demonstrate grip strength on Rt hand 65lb or more without pain    Time  4    Period  Weeks    Status  New    Target Date  10/16/17        PT Long Term Goals - 09/25/17 1413      PT  LONG TERM GOAL #1   Title  pt will be ind with advanced HEP    Status  On-going      PT LONG TERM GOAL #2   Title  Pt will report 50% less dizziness during pickle ball games    Baseline  not playing due to hand pain    Status  On-going      PT LONG TERM GOAL #3   Title  FOTO < or = to 36% limited      PT LONG TERM GOAL #5   Title  Pt will report 50% less muscle spasms in her neck so she can tolerate playing 3 or more games of pickle ball to return to her normal activities.    Status  On-going      PT LONG TERM GOAL #6   Title  Pt will be able to play 2 consecutive games of pickleball without increased pain in her thumb    Status  On-going            Plan - 10/02/17 1242    Clinical Impression Statement  Pt had trigger points in wrist extensor, flexor and brachioradialis as well as thoracic paraspinals at T10-12.  Improved motion and decreased pain after soft tissue release.  Pt was educated and performed new exercises.  She continues to get improvements with ionto.  Pt was educated in resuming sports after thumb feels better and will be educated in slow return.  Pt will benefit from skilled PT to continue working toward functional goals.    PT  Treatment/Interventions  ADLs/Self Care Home Management;Biofeedback;Cryotherapy;Electrical Stimulation;Iontophoresis 4mg /ml Dexamethasone;Moist Heat;Ultrasound;Therapeutic activities;Therapeutic exercise;Balance training;Neuromuscular re-education;Patient/family education;Manual techniques;Taping;Dry needling;Passive range of motion    PT Next Visit Plan  STM, thoracic mobility, breathing technique, wrist and hand strengthening and STM, f/u on ionto #4    PT Home Exercise Plan  [email or web address]     Consulted and Agree with Plan of Care  Patient       Patient will benefit from skilled therapeutic intervention in order to improve the following deficits and impairments:  Pain, Dizziness, Increased fascial restricitons, Decreased strength, Decreased range of motion, Postural dysfunction, Increased muscle spasms  Visit Diagnosis: Cervicalgia  Other muscle spasm     Problem List Patient Active Problem List   Diagnosis Date Noted  . Chronic neck pain 08/27/2017  . Sore throat 08/27/2017  . Hyperglycemia 03/06/2017  . GERD (gastroesophageal reflux disease) 04/20/2016  . Numbness 08/06/2015  . Osteopenia 12/10/2014  . Allergic urticaria 12/10/2014  . Pressure in head 04/01/2014  . Dizziness 02/10/2014  . Elevated blood pressure reading 11/06/2013  . Hypothyroid   . Primary HSV infection of mouth     Zannie Cove, PT 10/02/2017, 5:47 PM  [facility] [address] [place], [place], [address] Phone: [phone]   Fax:  [phone]  Name: Tina Lucas MRN: [id number] Date of Birth: 14-Nov-1950

## 2017-10-04 ENCOUNTER — Ambulatory Visit: Payer: Medicare Other | Admitting: Physical Therapy

## 2017-10-04 ENCOUNTER — Encounter: Payer: Self-pay | Admitting: Physical Therapy

## 2017-10-04 DIAGNOSIS — M542 Cervicalgia: Secondary | ICD-10-CM | POA: Diagnosis not present

## 2017-10-04 DIAGNOSIS — M62838 Other muscle spasm: Secondary | ICD-10-CM

## 2017-10-04 NOTE — Therapy (Signed)
[facility] [address] [place], [place], [address] Phone: [phone]   Fax:  [phone]  Physical Therapy Treatment  Patient Details  Name: Tina Lucas MRN: [id number] Date of Birth: Jun 20, 1950 Referring Provider: Binnie Rail, MD   Encounter Date: 10/04/2017  PT End of Session - 10/04/17 1255    Visit Number  7    Number of Visits  10    Date for PT Re-Evaluation  12/06/17    Authorization Type  [employer]HC medicare    PT Start Time  1233    PT Stop Time  1313    PT Time Calculation (min)  40 min    Activity Tolerance  Patient tolerated treatment well    Behavior During Therapy  [facility] for tasks assessed/performed       Past Medical History:  Diagnosis Date  . Allergic rhinitis   . 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 History of asthma 1956-2000  . Hypothyroid 2013  . Primary HSV infection of mouth 2008  . 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 TONSILLECTOMY  1958    There were no vitals filed for this visit.  Subjective Assessment - 10/04/17 1256    Subjective  Pt has not played pickle ball.  She is having tightness in middle back.  She is feeling better but some days the thumb still hurts.  She is still having tension in the neck but doesn't notice it as much because of her hand hurting.      Patient Stated Goals  reduce pain and dizziness to return to pickle ball    Currently in Pain?  No/denies                       [facility] Adult PT Treatment/Exercise - 10/04/17 0001      Self-Care   Other Self-Care Comments   reviewed HEP; educated on getting putty for strengthening grip muscles      Modalities   Modalities  Traction      Traction   Type of Traction  Cervical    Min (lbs)  5    Max (lbs)  15    Hold Time  60    Rest Time  10    Time   15      Manual Therapy   Soft tissue mobilization  thoracic paraspinals and intercostals on Rt side               PT Short Term Goals - 09/18/17 1311      PT SHORT TERM GOAL #1   Title  pt ind with initial HEP    Status  On-going      PT SHORT TERM GOAL #2   Title  pt will be able to perform bed mobility without dizziness    Status  On-going      PT SHORT TERM GOAL #3   Title  Pt will demonstrate grip strength on Rt hand 65lb or more without pain    Time  4    Period  Weeks    Status  New    Target Date  10/16/17        PT Long Term Goals - 09/25/17 1413      PT LONG TERM GOAL #1   Title  pt will be ind with advanced HEP  Status  On-going      PT LONG TERM GOAL #2   Title  Pt will report 50% less dizziness during pickle ball games    Baseline  not playing due to hand pain    Status  On-going      PT LONG TERM GOAL #3   Title  FOTO < or = to 36% limited      PT LONG TERM GOAL #5   Title  Pt will report 50% less muscle spasms in her neck so she can tolerate playing 3 or more games of pickle ball to return to her normal activities.    Status  On-going      PT LONG TERM GOAL #6   Title  Pt will be able to play 2 consecutive games of pickleball without increased pain in her thumb    Status  On-going            Plan - 10/04/17 1310    Clinical Impression Statement  Pt had trigger points and tenderness along Rt thoracic and into intercostal muscles.  Pt felt better after manual treatment and able to move more easily.  Pt had good release from traction and neck seems to be doing better overall.  She has been having lower levels of pain in wrist and was educated on returning to pickle ball.  Pt will benefit fromskilled PT to continue to work on safe return to full activity    PT Treatment/Interventions  ADLs/Self Care Home Management;Biofeedback;Cryotherapy;Electrical Stimulation;Iontophoresis 4mg /ml Dexamethasone;Moist Heat;Ultrasound;Therapeutic  activities;Therapeutic exercise;Balance training;Neuromuscular re-education;Patient/family education;Manual techniques;Taping;Dry needling;Passive range of motion    PT Next Visit Plan  STM, thoracic mobility, breathing technique, wrist and hand strengthening and STM, f/u on pickle ball    PT Home Exercise Plan  [email or web address]     Consulted and Agree with Plan of Care  Patient       Patient will benefit from skilled therapeutic intervention in order to improve the following deficits and impairments:  Pain, Dizziness, Increased fascial restricitons, Decreased strength, Decreased range of motion, Postural dysfunction, Increased muscle spasms  Visit Diagnosis: Cervicalgia  Other muscle spasm     Problem List Patient Active Problem List   Diagnosis Date Noted  . Chronic neck pain 08/27/2017  . Sore throat 08/27/2017  . Hyperglycemia 03/06/2017  . GERD (gastroesophageal reflux disease) 04/20/2016  . Numbness 08/06/2015  . Osteopenia 12/10/2014  . Allergic urticaria 12/10/2014  . Pressure in head 04/01/2014  . Dizziness 02/10/2014  . Elevated blood pressure reading 11/06/2013  . Hypothyroid   . Primary HSV infection of mouth     Zannie Cove, PT 10/04/2017, 1:24 PM  [facility] [address] [place], [place], [address] Phone: [phone]   Fax:  [phone]  Name: Tina Lucas MRN: [id number] Date of Birth: Oct 01, 1950

## 2017-10-11 ENCOUNTER — Ambulatory Visit: Payer: Medicare Other | Admitting: Physical Therapy

## 2017-10-11 DIAGNOSIS — M542 Cervicalgia: Secondary | ICD-10-CM

## 2017-10-11 DIAGNOSIS — M62838 Other muscle spasm: Secondary | ICD-10-CM

## 2017-10-11 NOTE — Therapy (Signed)
[facility] [address] [place], [place], [address] Phone: [phone]   Fax:  [phone]  Physical Therapy Treatment  Patient Details  Name: Tina Lucas MRN: [id number] Date of Birth: Nov 13, 1950 Referring Provider: Binnie Rail, MD   Encounter Date: 10/11/2017  PT End of Session - 10/11/17 1452    Visit Number  8    Number of Visits  10    Date for PT Re-Evaluation  12/06/17    Authorization Type  [employer]HC medicare    PT Start Time  1448    PT Stop Time  1528    PT Time Calculation (min)  40 min    Activity Tolerance  Patient tolerated treatment well    Behavior During Therapy  [facility] for tasks assessed/performed       Past Medical History:  Diagnosis Date  . Allergic rhinitis   . 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 History of asthma 1956-2000  . Hypothyroid 2013  . Primary HSV infection of mouth 2008  . 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 TONSILLECTOMY  1958    There were no vitals filed for this visit.  Subjective Assessment - 10/11/17 1453    Subjective  Pt states she played Wednesday and Thursday and played 7 matches in one day.  States it felt good but it was aching.  She went today to play and 3 matches.  She states today it wasn't as sore as it was after yesterday.  Today not painful.  States her mid-back and the vertigo is better.      Limitations  House hold activities;Other (comment)    Patient Stated Goals  reduce pain and dizziness to return to pickle ball    Currently in Pain?  No/denies                       [facility] Adult PT Treatment/Exercise - 10/11/17 0001      Exercises   Other Exercises   wrist flexion and extension for review of HEP; grip strength using putty demonstrated- 10x each      Traction   Type of Traction   Cervical    Min (lbs)  5    Max (lbs)  16    Hold Time  60    Rest Time  10    Time  15      Manual Therapy   Myofascial Release  cervical paraspinals and suboccipitals, SCM and scalenes Rt side               PT Short Term Goals - 09/18/17 1311      PT SHORT TERM GOAL #1   Title  pt ind with initial HEP    Status  On-going      PT SHORT TERM GOAL #2   Title  pt will be able to perform bed mobility without dizziness    Status  On-going      PT SHORT TERM GOAL #3   Title  Pt will demonstrate grip strength on Rt hand 65lb or more without pain    Time  4    Period  Weeks    Status  New    Target Date  10/16/17        PT Long Term Goals - 09/25/17 1413      PT  LONG TERM GOAL #1   Title  pt will be ind with advanced HEP    Status  On-going      PT LONG TERM GOAL #2   Title  Pt will report 50% less dizziness during pickle ball games    Baseline  not playing due to hand pain    Status  On-going      PT LONG TERM GOAL #3   Title  FOTO < or = to 36% limited      PT LONG TERM GOAL #5   Title  Pt will report 50% less muscle spasms in her neck so she can tolerate playing 3 or more games of pickle ball to return to her normal activities.    Status  On-going      PT LONG TERM GOAL #6   Title  Pt will be able to play 2 consecutive games of pickleball without increased pain in her thumb    Status  On-going            Plan - 10/11/17 1512    Clinical Impression Statement  Pt is doing well and was able to return to some of her normal activities.  She is progressing towards goals with reduced pain and dizziness.  Pt will benefit from skilled PT to cotinue working towards funcitonal goals according to current POC.    PT Treatment/Interventions  ADLs/Self Care Home Management;Biofeedback;Cryotherapy;Electrical Stimulation;Iontophoresis 4mg /ml Dexamethasone;Moist Heat;Ultrasound;Therapeutic activities;Therapeutic exercise;Balance training;Neuromuscular  re-education;Patient/family education;Manual techniques;Taping;Dry needling;Passive range of motion    PT Next Visit Plan  STM, thoracic mobility, breathing technique, wrist and hand strengthening and STM, f/u return to pickle ball    Consulted and Agree with Plan of Care  Patient       Patient will benefit from skilled therapeutic intervention in order to improve the following deficits and impairments:  Pain, Dizziness, Increased fascial restricitons, Decreased strength, Decreased range of motion, Postural dysfunction, Increased muscle spasms  Visit Diagnosis: Other muscle spasm  Cervicalgia     Problem List Patient Active Problem List   Diagnosis Date Noted  . Chronic neck pain 08/27/2017  . Sore throat 08/27/2017  . Hyperglycemia 03/06/2017  . GERD (gastroesophageal reflux disease) 04/20/2016  . Numbness 08/06/2015  . Osteopenia 12/10/2014  . Allergic urticaria 12/10/2014  . Pressure in head 04/01/2014  . Dizziness 02/10/2014  . Elevated blood pressure reading 11/06/2013  . Hypothyroid   . Primary HSV infection of mouth     Zannie Cove 10/11/2017, 3:14 PM  [facility] [address], [place], [place], [address] Phone: [phone]   Fax:  [phone]  Name: Tina Lucas MRN: [id number] Date of Birth: 1951/01/04

## 2017-10-18 ENCOUNTER — Encounter: Payer: Self-pay | Admitting: Physical Therapy

## 2017-10-18 ENCOUNTER — Ambulatory Visit: Payer: Medicare Other | Admitting: Physical Therapy

## 2017-10-18 DIAGNOSIS — M542 Cervicalgia: Secondary | ICD-10-CM

## 2017-10-18 DIAGNOSIS — M62838 Other muscle spasm: Secondary | ICD-10-CM

## 2017-10-18 NOTE — Therapy (Signed)
[facility] [address] [place], [place], [address] Phone: [phone]   Fax:  [phone]  Physical Therapy Treatment  Patient Details  Name: Tina Lucas MRN: [id number] Date of Birth: 01-Dec-1950 Referring Provider (PT): Binnie Rail, MD   Encounter Date: 10/18/2017  PT End of Session - 10/18/17 1331    Visit Number  9    Number of Visits  10    Date for PT Re-Evaluation  12/06/17    Authorization Type  [employer] medicare    PT Start Time  1250    PT Stop Time  1315    PT Time Calculation (min)  25 min    Activity Tolerance  Patient tolerated treatment well    Behavior During Therapy  [facility] for tasks assessed/performed       Past Medical History:  Diagnosis Date  . Allergic rhinitis   . 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 History of asthma 1956-2000  . Hypothyroid 2013  . Primary HSV infection of mouth 2008  . 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 TONSILLECTOMY  1958    There were no vitals filed for this visit.  Subjective Assessment - 10/18/17 1323    Subjective  Pt reports she played pickle ball the last two days and her wrist is better.  There is a little soreness in the thumb.  Denies pain at rest.  She is having tension in the back of the head that feels like the onset of vertigo.    Pertinent History  chronic vertigo, BPPV    Limitations  House hold activities;Other (comment)    Currently in Pain?  No/denies                       [facility] Adult PT Treatment/Exercise - 10/18/17 0001      Manual Therapy   Myofascial Release  cervical paraspinals and suboccipitals, SCM and scalenes Rt side               PT Short Term Goals - 10/18/17 1329      PT SHORT TERM GOAL #1   Title  pt ind with initial HEP    Status  Achieved      PT  SHORT TERM GOAL #2   Title  pt will be able to perform bed mobility without dizziness    Status  On-going      PT SHORT TERM GOAL #3   Title  Pt will demonstrate grip strength on Rt hand 65lb or more without pain    Status  On-going        PT Long Term Goals - 10/18/17 1329      PT LONG TERM GOAL #1   Title  pt will be ind with advanced HEP    Status  On-going      PT LONG TERM GOAL #2   Title  Pt will report 50% less dizziness during pickle ball games    Baseline  able to play without interruptions the last two days    Status  On-going      PT LONG TERM GOAL #3   Title  FOTO < or = to 36% limited    Status  On-going      PT LONG TERM GOAL #4   Title  pt demonstrates improved cervical rotation to  65 deg bilaterally for improved ability to navigate traffic while driving    Status  On-going      PT LONG TERM GOAL #5   Title  Pt will report 50% less muscle spasms in her neck so she can tolerate playing 3 or more games of pickle ball to return to her normal activities.    Status  On-going      PT LONG TERM GOAL #6   Title  Pt will be able to play 2 consecutive games of pickleball without increased pain in her thumb    Status  Achieved            Plan - 10/18/17 1326    Clinical Impression Statement  Pt is demonstrating progress towards her goals and has been able to return to normal activities this week .  She continues to have muscle spasms in her neck that improved with soft tissue treatment today.  Pt had less time today due to running late. Pt will benefit from skilled PT to continue progressing per POC.    PT Treatment/Interventions  ADLs/Self Care Home Management;Biofeedback;Cryotherapy;Electrical Stimulation;Iontophoresis 4mg /ml Dexamethasone;Moist Heat;Ultrasound;Therapeutic activities;Therapeutic exercise;Balance training;Neuromuscular re-education;Patient/family education;Manual techniques;Taping;Dry needling;Passive range of motion    PT Next Visit Plan  STM,  thoracic mobility, breathing technique, wrist and hand strengthening and STM, f/u return to pickle ball    PT Home Exercise Plan  [id number]     Consulted and Agree with Plan of Care  Patient       Patient will benefit from skilled therapeutic intervention in order to improve the following deficits and impairments:  Pain, Dizziness, Increased fascial restricitons, Decreased strength, Decreased range of motion, Postural dysfunction, Increased muscle spasms  Visit Diagnosis: Other muscle spasm  Cervicalgia     Problem List Patient Active Problem List   Diagnosis Date Noted  . Chronic neck pain 08/27/2017  . Sore throat 08/27/2017  . Hyperglycemia 03/06/2017  . GERD (gastroesophageal reflux disease) 04/20/2016  . Numbness 08/06/2015  . Osteopenia 12/10/2014  . Allergic urticaria 12/10/2014  . Pressure in head 04/01/2014  . Dizziness 02/10/2014  . Elevated blood pressure reading 11/06/2013  . Hypothyroid   . Primary HSV infection of mouth     Zannie Cove, PT 10/18/2017, 1:32 PM  [facility] [address], [place], [place], [address] Phone: [phone]   Fax:  [phone]  Name: Latrecia Capito MRN: [id number] Date of Birth: [birth year]

## 2017-10-25 ENCOUNTER — Ambulatory Visit: Payer: Medicare Other | Attending: Internal Medicine | Admitting: Physical Therapy

## 2017-10-25 ENCOUNTER — Other Ambulatory Visit: Payer: Self-pay | Admitting: Internal Medicine

## 2017-10-25 ENCOUNTER — Encounter: Payer: Self-pay | Admitting: Physical Therapy

## 2017-10-25 DIAGNOSIS — M542 Cervicalgia: Secondary | ICD-10-CM | POA: Diagnosis present

## 2017-10-25 DIAGNOSIS — M62838 Other muscle spasm: Secondary | ICD-10-CM | POA: Insufficient documentation

## 2017-10-25 NOTE — Therapy (Signed)
[facility] [address], [place], [place], [address] Phone: [phone]   Fax:  [phone]  Physical Therapy Treatment Progress Note Reporting Period 10/25/17  to 09/13/17  See note below for Objective Data and Assessment of Progress/Goals.      Patient Details  Name: Tina Lucas MRN: [id number] Date of Birth: 1950-12-17 Referring Provider (PT): Binnie Rail, MD   Encounter Date: 10/25/2017  PT End of Session - 10/25/17 1245    Visit Number  10    Number of Visits  10    Date for PT Re-Evaluation  12/06/17    Authorization Type  [employer] medicare    PT Start Time  1234    PT Stop Time  1314    PT Time Calculation (min)  40 min    Activity Tolerance  Patient tolerated treatment well    Behavior During Therapy  [facility] for tasks assessed/performed       Past Medical History:  Diagnosis Date  . Allergic rhinitis   . 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 History of asthma 1956-2000  . Hypothyroid 2013  . Primary HSV infection of mouth 2008  . 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 TONSILLECTOMY  1958    There were no vitals filed for this visit.  Subjective Assessment - 10/25/17 1243    Subjective  Pt has been having more heaviness in back of the head.  She hasn't played pickle ball this week due to needing to help husband with doctor's visits.  Pt states the thumb and hand is better.    Pertinent History  chronic vertigo, BPPV    Limitations  House hold activities;Other (comment)    Patient Stated Goals  reduce pain and dizziness to return to pickle ball    Currently in Pain?  No/denies         [facility] PT Assessment - 10/25/17 0001      Assessment   Medical Diagnosis  M62.81 (ICD-10-CM) - Thumb weakness    Referring Provider (PT)  Binnie Rail, MD       AROM   Cervical - Right Rotation  60    Cervical - Left Rotation  62                   [facility] Adult PT Treatment/Exercise - 10/25/17 0001      Self-Care   Other Self-Care Comments   educated on home traction unit for over the doorway      Traction   Type of Traction  Cervical    Min (lbs)  5    Max (lbs)  16    Hold Time  60    Rest Time  10    Time  15      Manual Therapy   Myofascial Release  cervical paraspinals and suboccipitals, SCM and scalenes bilat             PT Education - 10/25/17 1345    Education Details  traction info    Person(s) Educated  Patient    Methods  Explanation;Handout    Comprehension  Verbalized understanding       PT Short Term Goals - 10/18/17 1329      PT SHORT TERM GOAL #1   Title  pt ind with initial HEP    Status  Achieved  PT SHORT TERM GOAL #2   Title  pt will be able to perform bed mobility without dizziness    Status  On-going      PT SHORT TERM GOAL #3   Title  Pt will demonstrate grip strength on Rt hand 65lb or more without pain    Status  On-going        PT Long Term Goals - 10/25/17 1246      PT LONG TERM GOAL #1   Title  pt will be ind with advanced HEP    Status  On-going    Target Date  12/06/17      PT LONG TERM GOAL #2   Title  Pt will report 50% less dizziness during pickle ball games    Baseline  having 6/10 pressure on the back of the head today; yesterday there was no problem playing pickle ball    Status  On-going    Target Date  12/06/17      PT LONG TERM GOAL #3   Title  FOTO < or = to 36% limited    Baseline  34%    Time  8    Period  Weeks    Status  Achieved    Target Date  12/06/17      PT LONG TERM GOAL #4   Title  pt demonstrates improved cervical rotation to 65 deg bilaterally for improved ability to navigate traffic while driving    Status  On-going    Target Date  12/06/17      PT LONG TERM GOAL #5   Title  Pt will report 50% less muscle spasms in her neck so  she can tolerate playing 3 or more games of pickle ball to return to her normal activities.    Baseline  able to play 6 matches but is now feeling a lot of pressure in head    Status  On-going    Target Date  12/06/17      PT LONG TERM GOAL #6   Title  Pt will be able to play 2 consecutive games of pickleball without increased pain in her thumb   able to play 6 matches of pickle ball    Status  Achieved    Target Date  12/06/17            Plan - 10/25/17 1358    Clinical Impression Statement  Pt has made a lot of progress with reduced dizziness and cervical symptoms.  She has increased cervical rotation.  She is currently manageing her hand pain symptoms with HEP.  Pt is now just having some soreness that is effecting her and is the sensation that leads to her cervicogenic dizziness.  She will benefit from skilled PT to continue to improve soft tissue and cervical ROM in order to meet functional goals and enable pt to manage at home and get back to full function.    PT Treatment/Interventions  ADLs/Self Care Home Management;Biofeedback;Cryotherapy;Electrical Stimulation;Iontophoresis 4mg /ml Dexamethasone;Moist Heat;Ultrasound;Therapeutic activities;Therapeutic exercise;Balance training;Neuromuscular re-education;Patient/family education;Manual techniques;Taping;Dry needling;Passive range of motion    PT Next Visit Plan  STM, thoracic mobility, breathing technique, wrist and hand strengthening and STM, f/u return to pickle ball    Recommended Other Services  progress note and recert sent 03/01/72    Consulted and Agree with Plan of Care  Patient       Patient will benefit from skilled therapeutic intervention in order to improve the following deficits and impairments:  Pain, Dizziness, Increased  fascial restricitons, Decreased strength, Decreased range of motion, Postural dysfunction, Increased muscle spasms  Visit Diagnosis: Other muscle spasm - Plan: PT plan of care  cert/re-cert  Cervicalgia - Plan: PT plan of care cert/re-cert     Problem List Patient Active Problem List   Diagnosis Date Noted  . Chronic neck pain 08/27/2017  . Sore throat 08/27/2017  . Hyperglycemia 03/06/2017  . GERD (gastroesophageal reflux disease) 04/20/2016  . Numbness 08/06/2015  . Osteopenia 12/10/2014  . Allergic urticaria 12/10/2014  . Pressure in head 04/01/2014  . Dizziness 02/10/2014  . Elevated blood pressure reading 11/06/2013  . Hypothyroid   . Primary HSV infection of mouth     Zannie Cove, PT 10/25/2017, 2:59 PM  [facility] [address] [place], [place], [address] Phone: [phone]   Fax:  [phone]  Name: Tina Lucas MRN: [id number] Date of Birth: 1950-02-16

## 2017-10-31 ENCOUNTER — Ambulatory Visit: Payer: Medicare Other | Admitting: Physical Therapy

## 2017-11-08 ENCOUNTER — Encounter: Payer: Self-pay | Admitting: Physical Therapy

## 2017-11-08 ENCOUNTER — Ambulatory Visit: Payer: Medicare Other | Admitting: Physical Therapy

## 2017-11-08 ENCOUNTER — Encounter

## 2017-11-08 DIAGNOSIS — M542 Cervicalgia: Secondary | ICD-10-CM

## 2017-11-08 DIAGNOSIS — M62838 Other muscle spasm: Secondary | ICD-10-CM

## 2017-11-08 NOTE — Therapy (Signed)
[facility] [address], [place], [place], [address] Phone: [phone]   Fax:  [phone]  Physical Therapy Treatment  Patient Details  Name: Tina Lucas MRN: [id number] Date of Birth: 05/26/1950 Referring Provider (PT): Binnie Rail, MD   Encounter Date: 11/08/2017  PT End of Session - 11/08/17 1240    Visit Number  11    Number of Visits  20    Date for PT Re-Evaluation  12/06/17    Authorization Type  [employer] medicare    PT Start Time  1238    PT Stop Time  1318    PT Time Calculation (min)  40 min    Activity Tolerance  Patient tolerated treatment well    Behavior During Therapy  [facility] for tasks assessed/performed       Past Medical History:  Diagnosis Date  . Allergic rhinitis   . 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 History of asthma 1956-2000  . Hypothyroid 2013  . Primary HSV infection of mouth 2008  . 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 TONSILLECTOMY  1958    There were no vitals filed for this visit.  Subjective Assessment - 11/08/17 1241    Subjective  Pt states she felt good after previous treatment.  She is still having the back of head tension and some dizziness but overall improving.  Reports her wrist is doing well with the exercises.    Patient Stated Goals  reduce pain and dizziness to return to pickle ball    Currently in Pain?  No/denies                       [facility] Adult PT Treatment/Exercise - 11/08/17 0001      Self-Care   Other Self-Care Comments   review thoracic rotation in sitting      Traction   Type of Traction  Cervical    Min (lbs)  5    Max (lbs)  16    Hold Time  60    Rest Time  10    Time  15      Manual Therapy   Myofascial Release  respiratory diaphragm, shoulder girdle, occipital decompression  and stretch               PT Short Term Goals - 10/18/17 1329      PT SHORT TERM GOAL #1   Title  pt ind with initial HEP    Status  Achieved      PT SHORT TERM GOAL #2   Title  pt will be able to perform bed mobility without dizziness    Status  On-going      PT SHORT TERM GOAL #3   Title  Pt will demonstrate grip strength on Rt hand 65lb or more without pain    Status  On-going        PT Long Term Goals - 10/25/17 1246      PT LONG TERM GOAL #1   Title  pt will be ind with advanced HEP    Status  On-going    Target Date  12/06/17      PT LONG TERM GOAL #2   Title  Pt will report 50% less dizziness during pickle ball games    Baseline  having 6/10 pressure on the back  of the head today; yesterday there was no problem playing pickle ball    Status  On-going    Target Date  12/06/17      PT LONG TERM GOAL #3   Title  FOTO < or = to 36% limited    Baseline  34%    Time  8    Period  Weeks    Status  Achieved    Target Date  12/06/17      PT LONG TERM GOAL #4   Title  pt demonstrates improved cervical rotation to 65 deg bilaterally for improved ability to navigate traffic while driving    Status  On-going    Target Date  12/06/17      PT LONG TERM GOAL #5   Title  Pt will report 50% less muscle spasms in her neck so she can tolerate playing 3 or more games of pickle ball to return to her normal activities.    Baseline  able to play 6 matches but is now feeling a lot of pressure in head    Status  On-going    Target Date  12/06/17      PT LONG TERM GOAL #6   Title  Pt will be able to play 2 consecutive games of pickleball without increased pain in her thumb   able to play 6 matches of pickle ball    Status  Achieved    Target Date  12/06/17            Plan - 11/08/17 1251    Clinical Impression Statement  Pt responded well to change in myfascial techniques today.  She needed some review of thoracic rotation techniques for isolating thoracic  movements  Pt has pressure on back of head and dizziness after traction today.  This was alleviated after myofascial techniques. Pt will benefit from skilled PT to work on mobility and soft tissue restrictions.    PT Treatment/Interventions  ADLs/Self Care Home Management;Biofeedback;Cryotherapy;Electrical Stimulation;Iontophoresis 4mg /ml Dexamethasone;Moist Heat;Ultrasound;Therapeutic activities;Therapeutic exercise;Balance training;Neuromuscular re-education;Patient/family education;Manual techniques;Taping;Dry needling;Passive range of motion    PT Next Visit Plan  myofascial release to diaphragms and cranial region, STM cervical region, thoracic mobility, breathing techniques, cervical traction    PT Home Exercise Plan  [id number]     Consulted and Agree with Plan of Care  Patient       Patient will benefit from skilled therapeutic intervention in order to improve the following deficits and impairments:  Pain, Dizziness, Increased fascial restricitons, Decreased strength, Decreased range of motion, Postural dysfunction, Increased muscle spasms  Visit Diagnosis: Other muscle spasm  Cervicalgia     Problem List Patient Active Problem List   Diagnosis Date Noted  . Chronic neck pain 08/27/2017  . Sore throat 08/27/2017  . Hyperglycemia 03/06/2017  . GERD (gastroesophageal reflux disease) 04/20/2016  . Numbness 08/06/2015  . Osteopenia 12/10/2014  . Allergic urticaria 12/10/2014  . Pressure in head 04/01/2014  . Dizziness 02/10/2014  . Elevated blood pressure reading 11/06/2013  . Hypothyroid   . Primary HSV infection of mouth     Zannie Cove, PT 11/08/2017, 1:35 PM  [facility] [address], [place], [place], [address] Phone: [phone]   Fax:  [phone]  Name: Tina Lucas MRN: [id number] Date of Birth: 1951-01-12

## 2017-11-15 ENCOUNTER — Ambulatory Visit: Payer: Medicare Other | Admitting: Physical Therapy

## 2017-11-15 ENCOUNTER — Encounter: Payer: Self-pay | Admitting: Physical Therapy

## 2017-11-15 DIAGNOSIS — M62838 Other muscle spasm: Secondary | ICD-10-CM

## 2017-11-15 DIAGNOSIS — M542 Cervicalgia: Secondary | ICD-10-CM

## 2017-11-15 NOTE — Therapy (Signed)
[facility] [address], [place], [place], [address] Phone: [phone]   Fax:  [phone]  Physical Therapy Treatment  Patient Details  Name: Tina Lucas MRN: [id number] Date of Birth: [birth year] Referring Provider (PT): Binnie Rail, MD   Encounter Date: 11/15/2017  PT End of Session - 11/15/17 1240    Visit Number  12    Number of Visits  20    Date for PT Re-Evaluation  12/06/17    Authorization Type  [employer] medicare    PT Start Time  1238    PT Stop Time  1318    PT Time Calculation (min)  40 min    Activity Tolerance  Patient tolerated treatment well    Behavior During Therapy  [facility] for tasks assessed/performed       Past Medical History:  Diagnosis Date  . Allergic rhinitis   . 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 History of asthma 1956-2000  . Hypothyroid 2013  . Primary HSV infection of mouth 2008  . 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 TONSILLECTOMY  1958    There were no vitals filed for this visit.  Subjective Assessment - 11/15/17 1605    Subjective  Pt states she hasn't had noticeable dizziness other than a little when getting hair done since previous session.     Pertinent History  chronic vertigo, BPPV    Limitations  House hold activities;Other (comment)    Patient Stated Goals  reduce pain and dizziness to return to pickle ball    Currently in Pain?  No/denies                       [facility] Adult PT Treatment/Exercise - 11/15/17 0001      Manual Therapy   Myofascial Release  thoracic inlet shoulder girdle, occipital decompression and stretch, frontal, temporals, parietals, sphenoid, madibular releases               PT Short Term Goals - 10/18/17 1329      PT SHORT TERM GOAL #1   Title  pt ind with initial HEP     Status  Achieved      PT SHORT TERM GOAL #2   Title  pt will be able to perform bed mobility without dizziness    Status  On-going      PT SHORT TERM GOAL #3   Title  Pt will demonstrate grip strength on Rt hand 65lb or more without pain    Status  On-going        PT Long Term Goals - 10/25/17 1246      PT LONG TERM GOAL #1   Title  pt will be ind with advanced HEP    Status  On-going    Target Date  12/06/17      PT LONG TERM GOAL #2   Title  Pt will report 50% less dizziness during pickle ball games    Baseline  having 6/10 pressure on the back of the head today; yesterday there was no problem playing pickle ball    Status  On-going    Target Date  12/06/17      PT LONG TERM GOAL #3   Title  FOTO < or = to 36% limited    Baseline  34%    Time  8    Period  Weeks    Status  Achieved    Target Date  12/06/17      PT LONG TERM GOAL #4   Title  pt demonstrates improved cervical rotation to 65 deg bilaterally for improved ability to navigate traffic while driving    Status  On-going    Target Date  12/06/17      PT LONG TERM GOAL #5   Title  Pt will report 50% less muscle spasms in her neck so she can tolerate playing 3 or more games of pickle ball to return to her normal activities.    Baseline  able to play 6 matches but is now feeling a lot of pressure in head    Status  On-going    Target Date  12/06/17      PT LONG TERM GOAL #6   Title  Pt will be able to play 2 consecutive games of pickleball without increased pain in her thumb   able to play 6 matches of pickle ball    Status  Achieved    Target Date  12/06/17            Plan - 11/15/17 1607    Clinical Impression Statement  Pt has had some improvements since previous session.  She responded well to fascial release techniques with addition of cranial releases today.  Pt will benefit from skilled PT to address soft tissue restrictions related to cervicogenic dizziness.    PT Treatment/Interventions   ADLs/Self Care Home Management;Biofeedback;Cryotherapy;Electrical Stimulation;Iontophoresis 4mg /ml Dexamethasone;Moist Heat;Ultrasound;Therapeutic activities;Therapeutic exercise;Balance training;Neuromuscular re-education;Patient/family education;Manual techniques;Taping;Dry needling;Passive range of motion    PT Next Visit Plan  f/u on response to myofascial release to diaphragms and cranial region, STM cervical region, thoracic mobility, breathing techniques, cervical traction    PT Home Exercise Plan  [id number]     Consulted and Agree with Plan of Care  Patient       Patient will benefit from skilled therapeutic intervention in order to improve the following deficits and impairments:  Pain, Dizziness, Increased fascial restricitons, Decreased strength, Decreased range of motion, Postural dysfunction, Increased muscle spasms  Visit Diagnosis: Other muscle spasm  Cervicalgia     Problem List Patient Active Problem List   Diagnosis Date Noted  . Chronic neck pain 08/27/2017  . Sore throat 08/27/2017  . Hyperglycemia 03/06/2017  . GERD (gastroesophageal reflux disease) 04/20/2016  . Numbness 08/06/2015  . Osteopenia 12/10/2014  . Allergic urticaria 12/10/2014  . Pressure in head 04/01/2014  . Dizziness 02/10/2014  . Elevated blood pressure reading 11/06/2013  . Hypothyroid   . Primary HSV infection of mouth     Zannie Cove, PT 11/15/2017, 4:09 PM  [facility] [address] [place], [place], [address] Phone: [phone]   Fax:  [phone]  Name: Tina Lucas MRN: [id number] Date of Birth: 03/03/1950

## 2017-11-22 ENCOUNTER — Encounter: Payer: Self-pay | Admitting: Physical Therapy

## 2017-11-22 ENCOUNTER — Ambulatory Visit: Payer: Medicare Other | Admitting: Physical Therapy

## 2017-11-22 DIAGNOSIS — M62838 Other muscle spasm: Secondary | ICD-10-CM | POA: Diagnosis not present

## 2017-11-22 DIAGNOSIS — M542 Cervicalgia: Secondary | ICD-10-CM

## 2017-11-22 NOTE — Therapy (Signed)
[facility] [address] [place], [place], [address] Phone: [phone]   Fax:  [phone]  Physical Therapy Treatment  Patient Details  Name: Tina Lucas MRN: [id number] Date of Birth: [birth year] Referring Provider (PT): Binnie Rail, MD   Encounter Date: 11/22/2017  PT End of Session - 11/22/17 1229    Visit Number  13    Number of Visits  20    Date for PT Re-Evaluation  12/06/17    Authorization Type  [employer] medicare    PT Start Time  1229    PT Stop Time  1314    PT Time Calculation (min)  45 min    Activity Tolerance  Patient tolerated treatment well    Behavior During Therapy  [facility] for tasks assessed/performed       Past Medical History:  Diagnosis Date  . Allergic rhinitis   . 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 History of asthma 1956-2000  . Hypothyroid 2013  . Primary HSV infection of mouth 2008  . 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 TONSILLECTOMY  1958    There were no vitals filed for this visit.  Subjective Assessment - 11/22/17 1229    Subjective  I feel like the head and the dizziness has been less since last time.      Limitations  House hold activities;Other (comment)    Patient Stated Goals  reduce pain and dizziness to return to pickle ball    Currently in Pain?  No/denies                       [facility] Adult PT Treatment/Exercise - 11/22/17 0001      Manual Therapy   Myofascial Release  repiratory diaphragm, thoracic inlet shoulder girdle, hyoid, occipital decompression and stretch, frontal, temporals, parietals, sphenoid, madibular releases               PT Short Term Goals - 10/18/17 1329      PT SHORT TERM GOAL #1   Title  pt ind with initial HEP    Status  Achieved      PT SHORT TERM GOAL #2   Title  pt will be able to perform bed mobility without dizziness    Status  On-going      PT SHORT TERM GOAL #3   Title  Pt will demonstrate grip strength on Rt hand 65lb or more without pain    Status  On-going        PT Long Term Goals - 10/25/17 1246      PT LONG TERM GOAL #1   Title  pt will be ind with advanced HEP    Status  On-going    Target Date  12/06/17      PT LONG TERM GOAL #2   Title  Pt will report 50% less dizziness during pickle ball games    Baseline  having 6/10 pressure on the back of the head today; yesterday there was no problem playing pickle ball    Status  On-going    Target Date  12/06/17      PT LONG TERM GOAL #3   Title  FOTO < or = to 36% limited    Baseline  34%    Time  8    Period  Weeks  Status  Achieved    Target Date  12/06/17      PT LONG TERM GOAL #4   Title  pt demonstrates improved cervical rotation to 65 deg bilaterally for improved ability to navigate traffic while driving    Status  On-going    Target Date  12/06/17      PT LONG TERM GOAL #5   Title  Pt will report 50% less muscle spasms in her neck so she can tolerate playing 3 or more games of pickle ball to return to her normal activities.    Baseline  able to play 6 matches but is now feeling a lot of pressure in head    Status  On-going    Target Date  12/06/17      PT LONG TERM GOAL #6   Title  Pt will be able to play 2 consecutive games of pickleball without increased pain in her thumb   able to play 6 matches of pickle ball    Status  Achieved    Target Date  12/06/17            Plan - 11/22/17 1400    Clinical Impression Statement  Pt had a little more dizziness when sititng up today, but she was in a more flat position . Pt experienced good results after treatment last week with decreased heaviness behind her head and decreased dizziness.  Pt will benefit from skilled PT to continue to work on improved fascial mobility.    PT Treatment/Interventions   ADLs/Self Care Home Management;Biofeedback;Cryotherapy;Electrical Stimulation;Iontophoresis 4mg /ml Dexamethasone;Moist Heat;Ultrasound;Therapeutic activities;Therapeutic exercise;Balance training;Neuromuscular re-education;Patient/family education;Manual techniques;Taping;Dry needling;Passive range of motion    PT Next Visit Plan  continue myofascial protocol    PT Home Exercise Plan  [email or web address]     Consulted and Agree with Plan of Care  Patient       Patient will benefit from skilled therapeutic intervention in order to improve the following deficits and impairments:  Pain, Dizziness, Increased fascial restricitons, Decreased strength, Decreased range of motion, Postural dysfunction, Increased muscle spasms  Visit Diagnosis: Other muscle spasm  Cervicalgia     Problem List Patient Active Problem List   Diagnosis Date Noted  . Chronic neck pain 08/27/2017  . Sore throat 08/27/2017  . Hyperglycemia 03/06/2017  . GERD (gastroesophageal reflux disease) 04/20/2016  . Numbness 08/06/2015  . Osteopenia 12/10/2014  . Allergic urticaria 12/10/2014  . Pressure in head 04/01/2014  . Dizziness 02/10/2014  . Elevated blood pressure reading 11/06/2013  . Hypothyroid   . Primary HSV infection of mouth     Wayne Both 11/22/2017, 2:04 PM  [facility] [address] [place], [place], [address] Phone: [phone]   Fax:  [phone]  Name: Tina Lucas MRN: [id number] Date of Birth: 02-19-1950

## 2017-11-29 ENCOUNTER — Ambulatory Visit: Payer: Medicare Other | Attending: Internal Medicine | Admitting: Physical Therapy

## 2017-11-29 DIAGNOSIS — M62838 Other muscle spasm: Secondary | ICD-10-CM | POA: Diagnosis present

## 2017-11-29 DIAGNOSIS — M542 Cervicalgia: Secondary | ICD-10-CM | POA: Diagnosis present

## 2017-11-29 NOTE — Therapy (Signed)
[facility] [address], [place], [place], [address] Phone: [phone]   Fax:  [phone]  Physical Therapy Treatment  Patient Details  Name: Tina Lucas MRN: [id number] Date of Birth: 1950/02/01 Referring Provider (PT): Binnie Rail, MD   Encounter Date: 11/29/2017  PT End of Session - 11/29/17 1528    Visit Number  14    Number of Visits  20    Date for PT Re-Evaluation  12/06/17    Authorization Type  [employer] medicare    PT Start Time  1528    PT Stop Time  1615    PT Time Calculation (min)  47 min    Activity Tolerance  Patient tolerated treatment well    Behavior During Therapy  [facility] for tasks assessed/performed       Past Medical History:  Diagnosis Date  . Allergic rhinitis   . 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 History of asthma 1956-2000  . Hypothyroid 2013  . Primary HSV infection of mouth 2008  . 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 TONSILLECTOMY  1958    There were no vitals filed for this visit.  Subjective Assessment - 11/29/17 1532    Subjective  I could tell the neck is a little tight because of playing pickle ball.  Just a touch of dizziness when I get up in the morning.  I     Pertinent History  chronic vertigo, BPPV    Limitations  House hold activities;Other (comment)    Patient Stated Goals  reduce pain and dizziness to return to pickle ball         [facility] PT Assessment - 11/29/17 0001      Assessment   Medical Diagnosis  M62.81 (ICD-10-CM) - Thumb weakness    Referring Provider (PT)  Binnie Rail, MD      AROM   Cervical - Right Rotation  60    Cervical - Left Rotation  65                   [facility] Adult PT Treatment/Exercise - 11/29/17 0001      Manual Therapy   Myofascial Release  repiratory diaphragm,  thoracic inlet shoulder girdle, hyoid, occipital, frontal, temporals, parietals, sphenoid               PT Short Term Goals - 10/18/17 1329      PT SHORT TERM GOAL #1   Title  pt ind with initial HEP    Status  Achieved      PT SHORT TERM GOAL #2   Title  pt will be able to perform bed mobility without dizziness    Status  On-going      PT SHORT TERM GOAL #3   Title  Pt will demonstrate grip strength on Rt hand 65lb or more without pain    Status  On-going        PT Long Term Goals - 11/29/17 1540      PT LONG TERM GOAL #1   Title  pt will be ind with advanced HEP    Status  On-going      PT LONG TERM GOAL #2   Title  Pt will report 50% less dizziness during pickle ball games    Baseline  I am not feeling any dizziness going to  pickle ball    Status  Achieved      PT LONG TERM GOAL #3   Title  FOTO < or = to 36% limited    Baseline  34%    Status  Achieved      PT LONG TERM GOAL #4   Title  pt demonstrates improved cervical rotation to 65 deg bilaterally for improved ability to navigate traffic while driving    Baseline  60 deg right; 65 left    Time  6    Period  Weeks    Status  Partially Met    Target Date  01/10/18      PT LONG TERM GOAL #5   Title  Pt will report 50% less muscle spasms in her neck so she can tolerate playing 3 or more games of pickle ball to return to her normal activities.    Baseline  able to play 7 very competitive games    Status  Achieved      PT LONG TERM GOAL #6   Title  Pt will be able to play 2 consecutive games of pickleball without increased pain in her thumb    Status  Achieved      PT LONG TERM GOAL #7   Title  Pt will report no heaviness or headache feeling in the back of the head for >2 days/week    Baseline  this week was the best it has been but usually feels every day    Time  6    Period  Weeks    Status  New    Target Date  01/10/18            Plan - 11/29/17 1550    Clinical Impression Statement   Pt is having less pain in the back of her head that has improved since doing the myofascial releases.  Pt has made significant improvements since starting PT.  Pt has still not met all of her functional goals for cervical rotation and still gets increased symtpoms with cervical rotation to end range.  We have done several treatments of fascial release techniques that seem to be making a difference in patient's symptoms and it would be beneficial to continue as appropriate so that patient does not regress at this time.  Pt will benefit from skilled PT to address soft tissue impairments and return to maximum functional and recreational activities as part of a healthy lifestyle.      PT Treatment/Interventions  ADLs/Self Care Home Management;Biofeedback;Cryotherapy;Electrical Stimulation;Iontophoresis 50m/ml Dexamethasone;Moist Heat;Ultrasound;Therapeutic activities;Therapeutic exercise;Balance training;Neuromuscular re-education;Patient/family education;Manual techniques;Taping;Dry needling;Passive range of motion    PT Next Visit Plan  continue myofascial protocol, check ROM cervical rotation    Recommended Other Services  PN 153/61 re-cert 144/31   Consulted and Agree with Plan of Care  Patient       Patient will benefit from skilled therapeutic intervention in order to improve the following deficits and impairments:  Pain, Dizziness, Increased fascial restricitons, Decreased strength, Decreased range of motion, Postural dysfunction, Increased muscle spasms  Visit Diagnosis: Other muscle spasm  Cervicalgia     Problem List Patient Active Problem List   Diagnosis Date Noted  . Chronic neck pain 08/27/2017  . Sore throat 08/27/2017  . Hyperglycemia 03/06/2017  . GERD (gastroesophageal reflux disease) 04/20/2016  . Numbness 08/06/2015  . Osteopenia 12/10/2014  . Allergic urticaria 12/10/2014  . Pressure in head 04/01/2014  . Dizziness 02/10/2014  . Elevated blood pressure reading  11/06/2013  .  Hypothyroid   . Primary HSV infection of mouth     Zannie Cove, PT 11/29/2017, 5:04 PM  [facility] [address], [place], [place], [address] Phone: [phone]   Fax:  [phone]  Name: Tina Lucas MRN: [id number] Date of Birth: [birth year]

## 2017-12-13 ENCOUNTER — Ambulatory Visit: Payer: Medicare Other | Admitting: Physical Therapy

## 2017-12-13 DIAGNOSIS — M62838 Other muscle spasm: Secondary | ICD-10-CM | POA: Diagnosis not present

## 2017-12-13 DIAGNOSIS — M542 Cervicalgia: Secondary | ICD-10-CM

## 2017-12-13 NOTE — Therapy (Signed)
[facility] [address], [place], [place], [address] Phone: [phone]   Fax:  [phone]  Physical Therapy Treatment  Patient Details  Name: Tina Lucas MRN: [id number] Date of Birth: [birth year] Referring Provider (PT): Binnie Rail, MD   Encounter Date: 12/13/2017  PT End of Session - 12/13/17 1455    Visit Number  15    Number of Visits  20    Date for PT Re-Evaluation  01/10/18    Authorization Type  [employer] medicare    PT Start Time  1450    PT Stop Time  1531    PT Time Calculation (min)  41 min    Activity Tolerance  Patient tolerated treatment well    Behavior During Therapy  [facility] for tasks assessed/performed       Past Medical History:  Diagnosis Date  . Allergic rhinitis   . 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 History of asthma 1956-2000  . Hypothyroid 2013  . Primary HSV infection of mouth 2008  . 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 TONSILLECTOMY  1958    There were no vitals filed for this visit.  Subjective Assessment - 12/13/17 1725    Subjective  I have felt much better.  I want to know if there is anything else I can do for my thumb because it is still bothering me a little, but I can manage if I have to.  I still have a small amount of dizziness at times, but able to play 7 really good games of pickle ball today and no issues since last visit.     Pertinent History  chronic vertigo, BPPV    Limitations  House hold activities;Other (comment)    Patient Stated Goals  reduce pain and dizziness to return to pickle ball    Currently in Pain?  No/denies                       Tina Lucas, Tina Lucas Adult PT Treatment/Exercise - 12/13/17 0001      Hand Exercises   Other Hand Exercises  thumb ext, abd, and opposition to index finger - 10x each       Manual Therapy   Myofascial Release  cranial occipital, temporal, frontal, parietal, sphenoid fascial release               PT Short Term Goals - 10/18/17 1329      PT SHORT TERM GOAL #1   Title  pt ind with initial HEP    Status  Achieved      PT SHORT TERM GOAL #2   Title  pt will be able to perform bed mobility without dizziness    Status  On-going      PT SHORT TERM GOAL #3   Title  Pt will demonstrate grip strength on Rt hand 65lb or more without pain    Status  On-going        PT Long Term Goals - 11/29/17 1540      PT LONG TERM GOAL #1   Title  pt will be ind with advanced HEP    Status  On-going      PT LONG TERM GOAL #2   Title  Pt will report 50% less dizziness during pickle ball games    Baseline  I am not  feeling any dizziness going to pickle ball    Status  Achieved      PT LONG TERM GOAL #3   Title  FOTO < or = to 36% limited    Baseline  34%    Status  Achieved      PT LONG TERM GOAL #4   Title  pt demonstrates improved cervical rotation to 65 deg bilaterally for improved ability to navigate traffic while driving    Baseline  60 deg right; 65 left    Time  6    Period  Weeks    Status  Partially Met    Target Date  01/10/18      PT LONG TERM GOAL #5   Title  Pt will report 50% less muscle spasms in her neck so she can tolerate playing 3 or more games of pickle ball to return to her normal activities.    Baseline  able to play 7 very competitive games    Status  Achieved      PT LONG TERM GOAL #6   Title  Pt will be able to play 2 consecutive games of pickleball without increased pain in her thumb    Status  Achieved      PT LONG TERM GOAL #7   Title  Pt will report no heaviness or headache feeling in the back of the head for >2 days/week    Baseline  this week was the best it has been but usually feels every day    Time  6    Period  Weeks    Status  New    Target Date  01/10/18            Plan - 12/13/17 1726     Clinical Impression Statement  Pt is doing well and has reduced appointments down to every other week.  She may have relapse of symptoms so we are weaning off therapy gradually.  She had good fascial release with treatment today.  Pt continue to have soreness of thumb and was educated on AROM to add to HEP.    PT Treatment/Interventions  ADLs/Self Care Home Management;Biofeedback;Cryotherapy;Electrical Stimulation;Iontophoresis 24m/ml Dexamethasone;Moist Heat;Ultrasound;Therapeutic activities;Therapeutic exercise;Balance training;Neuromuscular re-education;Patient/family education;Manual techniques;Taping;Dry needling;Passive range of motion    PT Next Visit Plan  continue myofascial protocol, check ROM cervical rotation    PT Home Exercise Plan  [email or web address]    Consulted and Agree with Plan of Care  Patient       Patient will benefit from skilled therapeutic intervention in order to improve the following deficits and impairments:  Pain, Dizziness, Increased fascial restricitons, Decreased strength, Decreased range of motion, Postural dysfunction, Increased muscle spasms  Visit Diagnosis: Other muscle spasm  Cervicalgia     Problem List Patient Active Problem List   Diagnosis Date Noted  . Chronic neck pain 08/27/2017  . Sore throat 08/27/2017  . Hyperglycemia 03/06/2017  . GERD (gastroesophageal reflux disease) 04/20/2016  . Numbness 08/06/2015  . Osteopenia 12/10/2014  . Allergic urticaria 12/10/2014  . Pressure in head 04/01/2014  . Dizziness 02/10/2014  . Elevated blood pressure reading 11/06/2013  . Hypothyroid   . Primary HSV infection of mouth     JZannie Cove PT 12/13/2017, 5:31 PM  [facility] [address] [place] [address]Phone: [phone]  Fax:  [phone] Name: Tina CleverlyMRN: [id number] of Birth: 09/14/1950-03-12

## 2017-12-18 ENCOUNTER — Encounter: Payer: Medicare Other | Admitting: Physical Therapy

## 2017-12-25 ENCOUNTER — Ambulatory Visit: Payer: Medicare Other | Attending: Internal Medicine | Admitting: Physical Therapy

## 2017-12-25 DIAGNOSIS — M62838 Other muscle spasm: Secondary | ICD-10-CM | POA: Insufficient documentation

## 2017-12-25 DIAGNOSIS — M542 Cervicalgia: Secondary | ICD-10-CM | POA: Diagnosis present

## 2017-12-25 NOTE — Therapy (Signed)
[facility] [address], [place], [place], [address] Phone: [phone]   Fax:  [phone]  Physical Therapy Treatment  Patient Details  Name: Tina Lucas MRN: [id number] Date of Birth: 08/10/1950 Referring Provider (PT): Binnie Rail, MD   Encounter Date: 12/25/2017  PT End of Session - 12/25/17 1234    Visit Number  16    Number of Visits  20    Date for PT Re-Evaluation  01/10/18    Authorization Type  [employer] medicare    PT Start Time  7341    PT Stop Time  1315    PT Time Calculation (min)  44 min    Activity Tolerance  Patient tolerated treatment well    Behavior During Therapy  [facility] for tasks assessed/performed       Past Medical History:  Diagnosis Date  . Allergic rhinitis   . 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 History of asthma 1956-2000  . Hypothyroid 2013  . Primary HSV infection of mouth 2008  . 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 TONSILLECTOMY  1958    There were no vitals filed for this visit.  Subjective Assessment - 12/25/17 1237    Subjective  What we did the last time was good.  I didn't have any issues until yesterday I am having the ache in the back of the head.      Patient Stated Goals  reduce pain and dizziness to return to pickle ball    Currently in Pain?  Yes   no number given, just "aches"   Pain Location  Head    Pain Descriptors / Indicators  Aching;Dull                       [facility] Adult PT Treatment/Exercise - 12/25/17 0001      Manual Therapy   Soft tissue mobilization  cervical paraspinals and upper traps bilateral    Myofascial Release  cranial occipital, temporal, frontal, parietal, sphenoid fascial release               PT Short Term Goals - 10/18/17 1329      PT SHORT TERM GOAL #1   Title  pt ind with initial HEP    Status  Achieved      PT SHORT TERM GOAL #2   Title  pt will be able to perform bed mobility without dizziness    Status  On-going      PT SHORT TERM GOAL #3   Title  Pt will demonstrate grip strength on Rt hand 65lb or more without pain    Status  On-going        PT Long Term Goals - 11/29/17 1540      PT LONG TERM GOAL #1   Title  pt will be ind with advanced HEP    Status  On-going      PT LONG TERM GOAL #2   Title  Pt will report 50% less dizziness during pickle ball games    Baseline  I am not feeling any dizziness going to pickle ball    Status  Achieved      PT LONG TERM GOAL #3   Title  FOTO < or = to 36% limited    Baseline  34%    Status  Achieved  PT LONG TERM GOAL #4   Title  pt demonstrates improved cervical rotation to 65 deg bilaterally for improved ability to navigate traffic while driving    Baseline  60 deg right; 65 left    Time  6    Period  Weeks    Status  Partially Met    Target Date  01/10/18      PT LONG TERM GOAL #5   Title  Pt will report 50% less muscle spasms in her neck so she can tolerate playing 3 or more games of pickle ball to return to her normal activities.    Baseline  able to play 7 very competitive games    Status  Achieved      PT LONG TERM GOAL #6   Title  Pt will be able to play 2 consecutive games of pickleball without increased pain in her thumb    Status  Achieved      PT LONG TERM GOAL #7   Title  Pt will report no heaviness or headache feeling in the back of the head for >2 days/week    Baseline  this week was the best it has been but usually feels every day    Time  6    Period  Weeks    Status  New    Target Date  01/10/18            Plan - 12/25/17 1357    Clinical Impression Statement  Pt was able to go 2 weeks without ache in back of the head and feeling the onset of dizziness.  Pt began to feel it more yesterday.  She continues to respond well to fascial release  and will benefit from skilled PT for manual techniques to reduce symptoms of cervicogenic dizziness so she can continue to participate in normal activities.    PT Treatment/Interventions  ADLs/Self Care Home Management;Biofeedback;Cryotherapy;Electrical Stimulation;Iontophoresis 6m/ml Dexamethasone;Moist Heat;Ultrasound;Therapeutic activities;Therapeutic exercise;Balance training;Neuromuscular re-education;Patient/family education;Manual techniques;Taping;Dry needling;Passive range of motion    PT Next Visit Plan  continue myofascial protocol, check ROM cervical rotation    PT Home Exercise Plan  [id number]    Consulted and Agree with Plan of Care  Patient       Patient will benefit from skilled therapeutic intervention in order to improve the following deficits and impairments:  Pain, Dizziness, Increased fascial restricitons, Decreased strength, Decreased range of motion, Postural dysfunction, Increased muscle spasms  Visit Diagnosis: Other muscle spasm  Cervicalgia     Problem List Patient Active Problem List   Diagnosis Date Noted  . Chronic neck pain 08/27/2017  . Sore throat 08/27/2017  . Hyperglycemia 03/06/2017  . GERD (gastroesophageal reflux disease) 04/20/2016  . Numbness 08/06/2015  . Osteopenia 12/10/2014  . Allergic urticaria 12/10/2014  . Pressure in head 04/01/2014  . Dizziness 02/10/2014  . Elevated blood pressure reading 11/06/2013  . Hypothyroid   . Primary HSV infection of mouth     JZannie Cove PT 12/25/2017, 2:00 PM  [facility] [address] [place] [address]Phone: [phone]  Fax:  [phone] Name: Tina GuthridgeMRN: [id number] of Birth: [birth year]

## 2017-12-27 ENCOUNTER — Ambulatory Visit: Payer: Medicare Other | Admitting: Podiatry

## 2018-01-03 ENCOUNTER — Ambulatory Visit: Payer: Medicare Other | Admitting: Podiatry

## 2018-01-03 ENCOUNTER — Encounter: Payer: Self-pay | Admitting: Podiatry

## 2018-01-03 ENCOUNTER — Ambulatory Visit: Payer: Medicare Other | Admitting: Physical Therapy

## 2018-01-03 ENCOUNTER — Encounter: Payer: Self-pay | Admitting: Physical Therapy

## 2018-01-03 DIAGNOSIS — M79675 Pain in left toe(s): Secondary | ICD-10-CM

## 2018-01-03 DIAGNOSIS — M79674 Pain in right toe(s): Secondary | ICD-10-CM

## 2018-01-03 DIAGNOSIS — B351 Tinea unguium: Secondary | ICD-10-CM | POA: Diagnosis not present

## 2018-01-03 DIAGNOSIS — M542 Cervicalgia: Secondary | ICD-10-CM

## 2018-01-03 DIAGNOSIS — M62838 Other muscle spasm: Secondary | ICD-10-CM

## 2018-01-03 NOTE — Therapy (Signed)
[facility] [address] [place], [place], [address] Phone: [phone]   Fax:  [phone]  Physical Therapy Treatment  Patient Details  Name: Tina Lucas MRN: [id number] Date of Birth: [birth year] Referring Provider (PT): Binnie Rail, MD   Encounter Date: 01/03/2018  PT End of Session - 01/03/18 1325    Visit Number  17    Number of Visits  20    Date for PT Re-Evaluation  01/10/18    Authorization Type  [employer] medicare    PT Start Time  1234    PT Stop Time  9924    PT Time Calculation (min)  43 min    Activity Tolerance  Patient tolerated treatment well    Behavior During Therapy  [facility] for tasks assessed/performed       Past Medical History:  Diagnosis Date  . Allergic rhinitis   . 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 History of asthma 1956-2000  . Hypothyroid 2013  . Primary HSV infection of mouth 2008  . 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 TONSILLECTOMY  1958    There were no vitals filed for this visit.  Subjective Assessment - 01/03/18 1238    Subjective  The last time it wasn't as good afterwards, but I had my hair appointment afterwards. It felt better a couple days after.    Limitations  House hold activities;Other (comment)    Patient Stated Goals  reduce pain and dizziness to return to pickle ball    Currently in Pain?  Yes    Pain Score  5     Pain Location  Head    Pain Orientation  Posterior;Lower    Pain Descriptors / Indicators  Aching    Pain Type  Chronic pain    Pain Onset  More than a month ago    Multiple Pain Sites  No                       [facility] Adult PT Treatment/Exercise - 01/03/18 0001      Manual Therapy   Myofascial Release  pelvic diaphragm and distraction of sacrum, respiratory diaphragm, thoracic  inlet shoulder girdle, hyoid, occipital, frontal, temporals, parietals, sphenoid               PT Short Term Goals - 10/18/17 1329      PT SHORT TERM GOAL #1   Title  pt ind with initial HEP    Status  Achieved      PT SHORT TERM GOAL #2   Title  pt will be able to perform bed mobility without dizziness    Status  On-going      PT SHORT TERM GOAL #3   Title  Pt will demonstrate grip strength on Rt hand 65lb or more without pain    Status  On-going        PT Long Term Goals - 11/29/17 1540      PT LONG TERM GOAL #1   Title  pt will be ind with advanced HEP    Status  On-going      PT LONG TERM GOAL #2   Title  Pt will report 50% less dizziness during pickle ball games    Baseline  I am not feeling any dizziness going to pickle ball  Status  Achieved      PT LONG TERM GOAL #3   Title  FOTO < or = to 36% limited    Baseline  34%    Status  Achieved      PT LONG TERM GOAL #4   Title  pt demonstrates improved cervical rotation to 65 deg bilaterally for improved ability to navigate traffic while driving    Baseline  60 deg right; 65 left    Time  6    Period  Weeks    Status  Partially Met    Target Date  01/10/18      PT LONG TERM GOAL #5   Title  Pt will report 50% less muscle spasms in her neck so she can tolerate playing 3 or more games of pickle ball to return to her normal activities.    Baseline  able to play 7 very competitive games    Status  Achieved      PT LONG TERM GOAL #6   Title  Pt will be able to play 2 consecutive games of pickleball without increased pain in her thumb    Status  Achieved      PT LONG TERM GOAL #7   Title  Pt will report no heaviness or headache feeling in the back of the head for >2 days/week    Baseline  this week was the best it has been but usually feels every day    Time  6    Period  Weeks    Status  New    Target Date  01/10/18            Plan - 01/03/18 1326    Clinical Impression Statement  Pt was  able to tolerate lying flat on her back in order to work on sacral distraction for full myfascial release due to possible restrictions along spine into the sacrum contributing to restrictions in the back of the neck.  Patient had some release of fascial restrictions from sacrum and along spine with feeling referral into cervical region.  She will benefit from skilled PT to re-assess after this treatment.  Re-eval at next visit.    PT Treatment/Interventions  ADLs/Self Care Home Management;Biofeedback;Cryotherapy;Electrical Stimulation;Iontophoresis 6m/ml Dexamethasone;Moist Heat;Ultrasound;Therapeutic activities;Therapeutic exercise;Balance training;Neuromuscular re-education;Patient/family education;Manual techniques;Taping;Dry needling;Passive range of motion    PT Next Visit Plan  re-eval    PT Home Exercise Plan  [id number]    Consulted and Agree with Plan of Care  Patient       Patient will benefit from skilled therapeutic intervention in order to improve the following deficits and impairments:  Pain, Dizziness, Increased fascial restricitons, Decreased strength, Decreased range of motion, Postural dysfunction, Increased muscle spasms  Visit Diagnosis: No diagnosis found.     Problem List Patient Active Problem List   Diagnosis Date Noted  . Chronic neck pain 08/27/2017  . Sore throat 08/27/2017  . Hyperglycemia 03/06/2017  . GERD (gastroesophageal reflux disease) 04/20/2016  . Numbness 08/06/2015  . Osteopenia 12/10/2014  . Allergic urticaria 12/10/2014  . Pressure in head 04/01/2014  . Dizziness 02/10/2014  . Elevated blood pressure reading 11/06/2013  . Hypothyroid   . Primary HSV infection of mouth     JWayne Both12/12/2017, 1:36 PM  [facility] [address] [place] [address]Phone: [phone]  Fax:  [phone] Name: Tina LabrakeMRN: [id number] of Birth: 6October 06, 1952

## 2018-01-04 NOTE — Progress Notes (Signed)
Subjective:   Patient ID: Tina Lucas, female   DOB: 67 y.o.   MRN: [id number]   HPI Patient presents with nail disease 1-5 both feet that are thickened and she cannot cut and they become irritated with shoe gear   ROS      Objective:  Physical Exam  Neurovascular status intact with thick yellow brittle nailbeds 1-5 both feet     Assessment:  Chronic mycotic nail infection 1-5 both feet     Plan:  Debridement of painful nailbeds 1-5 both feet with no iatrogenic bleeding noted

## 2018-01-10 ENCOUNTER — Encounter: Payer: Self-pay | Admitting: Physical Therapy

## 2018-01-10 ENCOUNTER — Ambulatory Visit: Payer: Medicare Other | Admitting: Physical Therapy

## 2018-01-10 DIAGNOSIS — M62838 Other muscle spasm: Secondary | ICD-10-CM | POA: Diagnosis not present

## 2018-01-10 DIAGNOSIS — M542 Cervicalgia: Secondary | ICD-10-CM

## 2018-01-10 NOTE — Therapy (Signed)
[facility] [address], [place], [place], [address] Phone: [phone]   Fax:  [phone]  Physical Therapy Treatment  Patient Details  Name: Nyela Cortinas MRN: [id number] Date of Birth: 1950/09/02 Referring Provider (PT): Binnie Rail, MD   Encounter Date: 01/10/2018  PT End of Session - 01/10/18 1235    Visit Number  18    Number of Visits  20    Date for PT Re-Evaluation  04/04/18    Authorization Type  [employer] medicare    PT Start Time  1232    PT Stop Time  1316    PT Time Calculation (min)  44 min    Activity Tolerance  Patient tolerated treatment well    Behavior During Therapy  [facility] for tasks assessed/performed       Past Medical History:  Diagnosis Date  . Allergic rhinitis   . 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 History of asthma 1956-2000  . Hypothyroid 2013  . Primary HSV infection of mouth 2008  . 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 TONSILLECTOMY  1958    There were no vitals filed for this visit.  Subjective Assessment - 01/10/18 1315    Subjective  I felt really good and no back of the head since last visit.  Pt states having a 1-2/10 dizziness when getting up after lying flat.  Pt reports no pain the back of her head onset this week    Pertinent History  chronic vertigo, BPPV    Patient Stated Goals  reduce pain and dizziness to return to pickle ball    Currently in Pain?  No/denies         [facility] PT Assessment - 01/10/18 0001      Assessment   Medical Diagnosis  M62.81 (ICD-10-CM) - Thumb weakness    Referring Provider (PT)  Quay Burow, Claudina Lick, MD      AROM   Cervical Flexion  60    Cervical Extension  70   no dizziness immediately but feels a little funny   Cervical - Right Side Bend  40    Cervical - Left Side Bend  42    Cervical  - Right Rotation  60    Cervical - Left Rotation  65                   [facility] Adult PT Treatment/Exercise - 01/10/18 0001      Manual Therapy   Myofascial Release  pelvic diaphragm and distraction of sacrum, respiratory diaphragm, thoracic inlet shoulder girdle, hyoid, occipital, frontal, temporals, parietals, sphenoid               PT Short Term Goals - 01/10/18 1436      PT SHORT TERM GOAL #1   Title  pt will be able to make it through 2 weeks of normal activity with at most one episode of pain in the back of her head    Baseline  can make it one week without pain    Time  4    Period  Weeks    Status  New    Target Date  02/07/18        PT Long Term Goals - 01/10/18 1437      PT LONG TERM GOAL #1   Title  pt will be ind  with advanced HEP    Time  12    Period  Weeks    Status  On-going    Target Date  04/04/18      PT LONG TERM GOAL #2   Title  Pt will report 50% less dizziness during pickle ball games    Status  Achieved      PT LONG TERM GOAL #3   Title  FOTO < or = to 36% limited    Status  Achieved      PT LONG TERM GOAL #4   Title  pt demonstrates improved cervical rotation to 65 deg bilaterally for improved ability to navigate traffic while driving    Baseline  60 deg right; 65 left    Status  Partially Met      PT LONG TERM GOAL #5   Title  Pt will report 50% less muscle spasms in her neck so she can tolerate playing 3 or more games of pickle ball to return to her normal activities.    Status  Achieved      PT LONG TERM GOAL #6   Title  Pt will be able to play 2 consecutive games of pickleball without increased pain in her thumb    Status  Achieved      PT LONG TERM GOAL #7   Title  Pt will report no heaviness or headache feeling in the back of the head for >1 day/month    Baseline  has 2-3x/month but needed weekly release of tissue tension    Time  12    Period  Weeks    Status  Revised    Target Date  04/04/18             Plan - 01/10/18 1444    Clinical Impression Statement  Patient has met most of her long term goals.  She continues to have fascial restrictions that are gradually reducing.  Pt demonstrates improved cervical ROM for flexion, extension and sidebending.  She is having less dizziness overall and significantly less pain in the back of her head <1x/week which used to be almost every day.  Due to patients complicated type of vertigo that involves cervical muscle, she will benefit from more skilled therapy at this time.  Pt will benefit from skilled PT for 3-4 more treatments spread out over 12 weeks in order to allow to time to transition to HEP and reduce risk of regression and recurrence of symptoms.    PT Treatment/Interventions  ADLs/Self Care Home Management;Biofeedback;Cryotherapy;Electrical Stimulation;Iontophoresis 63m/ml Dexamethasone;Moist Heat;Ultrasound;Therapeutic activities;Therapeutic exercise;Balance training;Neuromuscular re-education;Patient/family education;Manual techniques;Taping;Dry needling;Passive range of motion    PT Next Visit Plan  myofascial release for 3-4 more visits with tapering frequency of visits    PT Home Exercise Plan  [id number]    Recommended Other Services  PN 109/32 recert sent 135/57/32   Consulted and Agree with Plan of Care  Patient       Patient will benefit from skilled therapeutic intervention in order to improve the following deficits and impairments:  Pain, Dizziness, Increased fascial restricitons, Decreased strength, Decreased range of motion, Postural dysfunction, Increased muscle spasms  Visit Diagnosis: Other muscle spasm  Cervicalgia     Problem List Patient Active Problem List   Diagnosis Date Noted  . Chronic neck pain 08/27/2017  . Sore throat 08/27/2017  . Hyperglycemia 03/06/2017  . GERD (gastroesophageal reflux disease) 04/20/2016  . Numbness 08/06/2015  . Osteopenia 12/10/2014  . Allergic urticaria 12/10/2014  .  Pressure in head 04/01/2014  . Dizziness 02/10/2014  . Elevated blood pressure reading 11/06/2013  . Hypothyroid   . Primary HSV infection of mouth     Zannie Cove, PT 01/10/2018, 5:36 PM  [facility] [address] [place], [place], [address] Phone: [phone]   Fax:  [phone]  Name: Etherine Mackowiak MRN: [id number] Date of Birth: November 02, 1950

## 2018-01-10 NOTE — Addendum Note (Signed)
Addended by: Lovett Calender D on: 01/10/2018 05:38 PM   Modules accepted: Orders

## 2018-01-14 ENCOUNTER — Encounter: Payer: Medicare Other | Admitting: Physical Therapy

## 2018-01-15 ENCOUNTER — Encounter: Payer: Self-pay | Admitting: Family

## 2018-01-15 ENCOUNTER — Ambulatory Visit: Payer: Medicare Other | Admitting: Family

## 2018-01-15 VITALS — BP 142/84 | HR 72 | Temp 97.8°F | Ht 68.0 in | Wt 129.0 lb

## 2018-01-15 DIAGNOSIS — J209 Acute bronchitis, unspecified: Secondary | ICD-10-CM

## 2018-01-15 MED ORDER — DOXYCYCLINE HYCLATE 100 MG PO TABS: 100.0000 mg | ORAL_TABLET | Freq: Two times a day (BID) | ORAL | 0 refills | Status: DC

## 2018-01-15 NOTE — Progress Notes (Signed)
Tina Lucas is a 67 y.o. female with the following history as recorded in Epic[employer]:  Patient Active Problem List   Diagnosis Date Noted  . Chronic neck pain 08/27/2017  . Sore throat 08/27/2017  . Hyperglycemia 03/06/2017  . GERD (gastroesophageal reflux disease) 04/20/2016  . Numbness 08/06/2015  . Osteopenia 12/10/2014  . Allergic urticaria 12/10/2014  . Pressure in head 04/01/2014  . Dizziness 02/10/2014  . Elevated blood pressure reading 11/06/2013  . Hypothyroid   . Primary HSV infection of mouth     Current Outpatient Medications  Medication Sig Dispense Refill  . Calcium Carbonate-Vitamin D (CALTRATE 600+D) 600-400 MG-UNIT per tablet Take 1 tablet by mouth daily.    . celecoxib (CELEBREX) 100 MG capsule TAKE ONE CAPSULE BY MOUTH TWICE A DAY (Patient taking differently: TAKE ONE CAPSULE BY MOUTH ONCE A DAY) 60 capsule 4  . cholecalciferol (VITAMIN D) 400 UNITS TABS tablet Take 400 Units by mouth.    . fexofenadine (ALLEGRA) 180 MG tablet Take 180 mg by mouth daily.    . fluocinonide cream (LIDEX) 3.23 % Apply 1 application topically 2 (two) times daily as needed (FOR PSORIASIS).     Marland Kitchen levothyroxine (SYNTHROID, LEVOTHROID) 50 MCG tablet TAKE ONE TABLET BY MOUTH EVERY MORNING BEFORE BREAKFAST 90 tablet 1  . Magnesium 250 MG TABS Take 125 mg by mouth daily.    . Multiple Vitamins-Minerals (ONE-A-DAY WOMENS 50+ ADVANTAGE PO) Take by mouth daily.    . prednisoLONE acetate (PRED FORTE) 1 % ophthalmic suspension Place 1 drop into the right eye daily.     . Probiotic Product (PROBIOTIC DAILY) CAPS Take 1 capsule by mouth daily.    Marland Kitchen doxycycline (VIBRA-TABS) 100 MG tablet Take 1 tablet (100 mg total) by mouth 2 (two) times daily. 20 tablet 0   No current facility-administered medications for this visit.     Allergies: Patient has no known allergies.  Past Medical History:  Diagnosis Date  . Allergic rhinitis   . Cataract    surgical correction, had complications from defective  lense  . History of asthma 1956-2000  . Hypothyroid 2013  . Primary HSV infection of mouth 2008  . 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 TONSILLECTOMY  1958    Family History  Problem Relation Age of Onset  . Breast cancer Mother 71  . Osteoarthritis Father   . Hypertension Father   . Leukemia Maternal Aunt 2  . Atrial fibrillation Brother     Social History   Tobacco Use  . Smoking status: Never Smoker  . Smokeless tobacco: Never Used  Substance Use Topics  . Alcohol use: Yes    Alcohol/week: 0.0 standard drinks    Subjective:  Patient presents with concerns x 1 week history of cough/ congestion; + productive cough/ green mucus; feeling "weak"; using OTC Delsym with limited benefit; not prone to recurrent pneumonia or bronchitis- has kept her from being able to play tennis/ pickleball;    Objective:  Vitals:   01/15/18 1139  BP: (!) 142/84  Pulse: 72  Temp: 97.8 F (36.6 C)  TempSrc: Oral  SpO2: 98%  Weight: 129 lb (58.5 kg)  Height: 5\' 8"  (1.727 m)    General: Well developed, well nourished, in no acute distress  Skin : Warm and dry.  Head: Normocephalic and atraumatic  Eyes: Sclera and conjunctiva clear; pupils round and reactive to light; extraocular movements intact  Ears: External normal; canals clear; tympanic membranes normal  Oropharynx: Pink, supple. No suspicious lesions  Neck: Supple without thyromegaly, adenopathy  Lungs: Respirations unlabored; wheezing noted in all 4 lobes  CVS exam: normal rate and regular rhythm.  Neurologic: Alert and oriented; speech intact; face symmetrical; moves all extremities well; CNII-XII intact without focal deficit   Assessment:  1. Acute bronchitis, unspecified organism     Plan:  Rx for Doxycycline 100 mg bid x 10 days; sample of BREO 200 mg qd x 10 days; increase fluids, rest and follow-up worse, no  better.    No follow-ups on file.  No orders of the defined types were placed in this encounter.   Requested Prescriptions   Signed Prescriptions Disp Refills  . doxycycline (VIBRA-TABS) 100 MG tablet 20 tablet 0    Sig: Take 1 tablet (100 mg total) by mouth 2 (two) times daily.

## 2018-01-30 ENCOUNTER — Ambulatory Visit: Payer: Self-pay | Admitting: *Deleted

## 2018-01-30 NOTE — Progress Notes (Signed)
Subjective:    Patient ID: Tina Lucas, female    DOB: March 06, 1950, 68 y.o.   MRN: [id number]  HPI She is here for an acute visit for cold symptoms.  She has a history of asthma.    She was seen 12/24 for cold symptoms.  She was diagnosed with acute bronchitis and was placed on doxycycline for 10 days, given a sample of Breo 200 mg.  Her symptoms started 1 week prior to being seen on 12/24.  She had a productive cough with green sputum, wheezing and SOB.  She has completed the antibiotic and has used the inhaler x 14 days.  She is here because she is still having concerning symptoms.  She felt good when she finished the medications for a couple of days.    She had a minimal cough the first couple of days after completing the antibiotic and still has minimal cough and medication will bring up minimal sputum.  Yesterday she was playing indoor pickleball.  She started to feel decreased stamina and mildly sob or weak in her lungs / did not have the lung capacity she should.  At rest right now her lungs feel inflamed and she can feel something in her airway.  She is unsure if she still has an infection or if this was any asthma.  She has taken delsym at night  Medications and allergies reviewed with patient and updated if appropriate.  Patient Active Problem List   Diagnosis Date Noted  . Asthma 01/31/2018  . Chronic neck pain 08/27/2017  . Hyperglycemia 03/06/2017  . GERD (gastroesophageal reflux disease) 04/20/2016  . Numbness 08/06/2015  . Osteopenia 12/10/2014  . Allergic urticaria 12/10/2014  . Pressure in head 04/01/2014  . Dizziness 02/10/2014  . Elevated blood pressure reading 11/06/2013  . Hypothyroid   . Primary HSV infection of mouth     Current Outpatient Medications on File Prior to Visit  Medication Sig Dispense Refill  . Calcium Carbonate-Vitamin D (CALTRATE 600+D) 600-400 MG-UNIT per tablet Take 1 tablet by mouth daily.    . celecoxib (CELEBREX) 100 MG capsule TAKE  ONE CAPSULE BY MOUTH TWICE A DAY (Patient taking differently: TAKE ONE CAPSULE BY MOUTH ONCE A DAY) 60 capsule 4  . cholecalciferol (VITAMIN D) 400 UNITS TABS tablet Take 400 Units by mouth.    . fexofenadine (ALLEGRA) 180 MG tablet Take 180 mg by mouth daily.    . fluocinonide cream (LIDEX) 1.63 % Apply 1 application topically 2 (two) times daily as needed (FOR PSORIASIS).     Marland Kitchen levothyroxine (SYNTHROID, LEVOTHROID) 50 MCG tablet TAKE ONE TABLET BY MOUTH EVERY MORNING BEFORE BREAKFAST 90 tablet 1  . Magnesium 250 MG TABS Take 125 mg by mouth daily.    . Multiple Vitamins-Minerals (ONE-A-DAY WOMENS 50+ ADVANTAGE PO) Take by mouth daily.    . prednisoLONE acetate (PRED FORTE) 1 % ophthalmic suspension Place 1 drop into the right eye daily.     . Probiotic Product (PROBIOTIC DAILY) CAPS Take 1 capsule by mouth daily.     No current facility-administered medications on file prior to visit.     Past Medical History:  Diagnosis Date  . Allergic rhinitis   . 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 History of asthma 1956-2000  . Hypothyroid 2013  . Primary HSV infection of mouth 2008  . Sunlight-induced angio-edema-urticaria 07/11/2011   unknown trigger, neg autoimmune workup    Past Surgical History:  Procedure  Laterality Date  . TONSILLECTOMY  1958    Social History   Socioeconomic History  . Marital status: Married    Spouse name: Not on file  . Number of children: Not on file  . Years of education: Not on file  . Highest education level: Not on file  Occupational History  . Not on file  Social Needs  . Financial resource strain: Not on file  . Food insecurity:    Worry: Not on file    Inability: Not on file  . Transportation needs:    Medical: Not on file    Non-medical: Not on file  Tobacco Use  . Smoking status: Never Smoker  . Smokeless tobacco: Never Used  Substance and Sexual Activity  . Alcohol use: Yes    Alcohol/week: 0.0  standard drinks  . Drug use: No  . Sexual activity: Not on file  Lifestyle  . Physical activity:    Days per week: Not on file    Minutes per session: Not on file  . Stress: Not on file  Relationships  . Social connections:    Talks on phone: Not on file    Gets together: Not on file    Attends religious service: Not on file    Active member of club or organization: Not on file    Attends meetings of clubs or organizations: Not on file    Relationship status: Not on file  Other Topics Concern  . Not on file  Social History Narrative   Lives with husband in a one story home.  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    Family History  Problem Relation Age of Onset  . Breast cancer Mother 83  . Osteoarthritis Father   . Hypertension Father   . Leukemia Maternal Aunt 68  . Atrial fibrillation Brother     Review of Systems  Constitutional: Negative for chills and fever.  HENT: Negative for congestion, ear pain, postnasal drip, sinus pain and sore throat.   Respiratory: Positive for cough (minimal, little productive - not thick), chest tightness and shortness of breath. Negative for wheezing.        Airway feels inflammed  Neurological: Negative for light-headedness and headaches.       Objective:   Vitals:   01/31/18 0938  BP: (!) 152/78  Pulse: 79  Resp: 16  Temp: 98.2 F (36.8 C)  SpO2: 99%   Filed Weights   01/31/18 0938  Weight: 128 lb 1.9 oz (58.1 kg)   Body mass index is 19.48 kg/m.  Wt Readings from Last 3 Encounters:  01/31/18 128 lb 1.9 oz (58.1 kg)  01/15/18 129 lb (58.5 kg)  08/27/17 127 lb (57.6 kg)     Physical Exam GENERAL APPEARANCE: Appears stated age, well appearing, NAD EYES: conjunctiva clear, no icterus LUNGS: unlabored breathing, good air entry bilaterally, no crackles, two minimal wheezes in mid-lower lungs CARDIOVASCULAR: Normal S1,S2 without murmurs, no edema SKIN: warm, dry        Assessment & Plan:   See  Problem List for Assessment and Plan of chronic medical problems.

## 2018-01-30 NOTE — Telephone Encounter (Signed)
Pt called with complaints of shortness of breath with a recent diagnosis of bronchitis; she states that she finished the antibiotic and the breo; after playing pickle ball on 01/30/2017; she feels like she needs more medication due to the discomfort in her chest; the pt describes this as "a weakness in her chest when she breaths"; recommendations made per nurse triage protocol; the pt would like to see Dr Quay Burow; pt offered and accepted appointment with Dr Celso Amy, [facility], 01/31/2018 at 0930; she verbalized understanding; will route to office for notification of this upcoming appointment.     Reason for Disposition . [1] MILD difficulty breathing (e.g., minimal/no SOB at rest, SOB with walking, pulse <100) AND [2] NEW-onset or WORSE than normal  Answer Assessment - Initial Assessment Questions 1. RESPIRATORY STATUS: "Describe your breathing?" (e.g., wheezing, shortness of breath, unable to speak, severe coughing)      Short of breath 2. ONSET: "When did this breathing problem begin?"      Seen in office 01/15/18 3. PATTERN "Does the difficult breathing come and go, or has it been constant since it started?"      intermittent 4. SEVERITY: "How bad is your breathing?" (e.g., mild, moderate, severe)    - MILD: No SOB at rest, mild SOB with walking, speaks normally in sentences, can lay down, no retractions, pulse < 100.    - MODERATE: SOB at rest, SOB with minimal exertion and prefers to sit, cannot lie down flat, speaks in phrases, mild retractions, audible wheezing, pulse 100-120.    - SEVERE: Very SOB at rest, speaks in single words, struggling to breathe, sitting hunched forward, retractions, pulse > 120      mild 5. RECURRENT SYMPTOM: "Have you had difficulty breathing before?" If so, ask: "When was the last time?" and "What happened that time?"      Seen in office 01/15/18 dx diagnosis 6. CARDIAC HISTORY: "Do you have any history of heart disease?" (e.g., heart attack, angina, bypass surgery,  angioplasty)      no 7. LUNG HISTORY: "Do you have any history of lung disease?"  (e.g., pulmonary embolus, asthma, emphysema)     History of bronchitis and asthma 8. CAUSE: "What do you think is causing the breathing problem?"      Recent diagnosis of bronchitis 01/15/18 9. OTHER SYMPTOMS: "Do you have any other symptoms? (e.g., dizziness, runny nose, cough, chest pain, fever)     Weakness in chest when breathing; occasional cough at night  10. PREGNANCY: "Is there any chance you are pregnant?" "When was your last menstrual period?"       no 11. TRAVEL: "Have you traveled out of the country in the last month?" (e.g., travel history, exposures)       no  Protocols used: BREATHING DIFFICULTY-A-AH

## 2018-01-31 ENCOUNTER — Ambulatory Visit: Payer: Medicare Other | Admitting: Internal Medicine

## 2018-01-31 ENCOUNTER — Ambulatory Visit: Payer: Medicare Other | Attending: Internal Medicine | Admitting: Physical Therapy

## 2018-01-31 ENCOUNTER — Encounter: Payer: Self-pay | Admitting: Internal Medicine

## 2018-01-31 ENCOUNTER — Encounter: Payer: Self-pay | Admitting: Physical Therapy

## 2018-01-31 VITALS — BP 152/78 | HR 79 | Temp 98.2°F | Resp 16 | Ht 68.0 in | Wt 128.1 lb

## 2018-01-31 DIAGNOSIS — M542 Cervicalgia: Secondary | ICD-10-CM | POA: Diagnosis present

## 2018-01-31 DIAGNOSIS — J4521 Mild intermittent asthma with (acute) exacerbation: Secondary | ICD-10-CM | POA: Insufficient documentation

## 2018-01-31 DIAGNOSIS — J45909 Unspecified asthma, uncomplicated: Secondary | ICD-10-CM

## 2018-01-31 DIAGNOSIS — M62838 Other muscle spasm: Secondary | ICD-10-CM | POA: Diagnosis not present

## 2018-01-31 DIAGNOSIS — J449 Chronic obstructive pulmonary disease, unspecified: Secondary | ICD-10-CM | POA: Insufficient documentation

## 2018-01-31 MED ORDER — FLUTICASONE FUROATE-VILANTEROL 100-25 MCG/INH IN AEPB: 1.0000 | INHALATION_SPRAY | Freq: Every day | RESPIRATORY_TRACT | 5 refills | Status: DC

## 2018-01-31 NOTE — Patient Instructions (Signed)
Your symptoms are likely related to asthma.  You do not have an active infection.    Take the [facility] daily for a few days and then try stopping it.  This was sent to your pharmacy.    Let me know if this is not helping.

## 2018-01-31 NOTE — Assessment & Plan Note (Signed)
Believes her infection is successfully treated with doxycycline-hurst current symptoms are likely related to mild asthma exacerbation Start Breo 100 mg daily-I think a low dose will be in an improvement in control her symptoms. Advised her to take the inhaler daily for a few days and then try discontinuing-may need intermittently, but does not need long-term Call if no improvement

## 2018-01-31 NOTE — Therapy (Signed)
[facility] [address], [address] [place], [place], [address] Phone: [phone]   Fax:  [phone]  Physical Therapy Treatment  Patient Details  Name: Tina Lucas MRN: [id number] Date of Birth: 05/04/1950 Referring Provider (PT): Burns, Stacy J, MD   Encounter Date: 01/31/2018  PT End of Session - 01/31/18 1226    Visit Number  19    Number of Visits  20    Date for PT Re-Evaluation  04/04/18    Authorization Type  [employer] medicare    PT Start Time  1226    PT Stop Time  1314    PT Time Calculation (min)  48 min    Activity Tolerance  Patient tolerated treatment well    Behavior During Therapy  WFL for tasks assessed/performed       Past Medical History:  Diagnosis Date  . Allergic rhinitis   . 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 History of asthma 1956-2000  . Hypothyroid 2013  . Primary HSV infection of mouth 2008  . 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 TONSILLECTOMY  1958    There were no vitals filed for this visit.  Subjective Assessment - 01/31/18 1354    Subjective  I have felt good and almost thought about cancelling today.  I want to wait and see how I do over the next two weeks.    Patient Stated Goals  reduce pain and dizziness to return to pickle ball    Currently in Pain?  No/denies                       [facility] Adult PT Treatment/Exercise - 01/31/18 0001      Manual Therapy   Myofascial Release  pelvic diaphragm and distraction of sacrum, respiratory diaphragm, thoracic inlet shoulder girdle, hyoid, occipital, frontal, temporals, parietals, sphenoid               PT Short Term Goals - 01/31/18 1401      PT SHORT TERM GOAL #1   Title  pt will be able to make it through 2 weeks of normal activity with at most one episode of  pain in the back of her head    Baseline  no episodes since last visit    Status  Achieved        PT Long Term Goals - 01/10/18 1437      PT LONG TERM GOAL #1   Title  pt will be ind with advanced HEP    Time  12    Period  Weeks    Status  On-going    Target Date  04/04/18      PT LONG TERM GOAL #2   Title  Pt will report 50% less dizziness during pickle ball games    Status  Achieved      PT LONG TERM GOAL #3   Title  FOTO < or = to 36% limited    Status  Achieved      PT LONG TERM GOAL #4   Title  pt demonstrates improved cervical rotation to 65 deg bilaterally for improved ability to navigate traffic while driving    Baseline  60 deg right; 65 left    Status  Partially Met      PT LONG TERM GOAL #5   Title    Pt will report 50% less muscle spasms in her neck so she can tolerate playing 3 or more games of pickle ball to return to her normal activities.    Status  Achieved      PT LONG TERM GOAL #6   Title  Pt will be able to play 2 consecutive games of pickleball without increased pain in her thumb    Status  Achieved      PT LONG TERM GOAL #7   Title  Pt will report no heaviness or headache feeling in the back of the head for >1 day/month    Baseline  has 2-3x/month but needed weekly release of tissue tension    Time  12    Period  Weeks    Status  Revised    Target Date  04/04/18            Plan - 01/31/18 1355    Clinical Impression Statement  Pt responded well to myofascial release.  She didn't have too many adhesions, mostly in thoracic inlet and occipital areas.  Pt will benefit from 1-2 more visits in order to ensure successful transition to normal activiites.    PT Treatment/Interventions  ADLs/Self Care Home Management;Biofeedback;Cryotherapy;Electrical Stimulation;Iontophoresis 4mg/ml Dexamethasone;Moist Heat;Ultrasound;Therapeutic activities;Therapeutic exercise;Balance training;Neuromuscular re-education;Patient/family education;Manual  techniques;Taping;Dry needling;Passive range of motion    PT Next Visit Plan  myofascial release for 1-2 more visits with tapering frequency of visits    Recommended Other Services  PN 10/03; recert 12/19 - order signed    Consulted and Agree with Plan of Care  Patient       Patient will benefit from skilled therapeutic intervention in order to improve the following deficits and impairments:  Pain, Dizziness, Increased fascial restricitons, Decreased strength, Decreased range of motion, Postural dysfunction, Increased muscle spasms  Visit Diagnosis: Other muscle spasm  Cervicalgia     Problem List Patient Active Problem List   Diagnosis Date Noted  . Asthma 01/31/2018  . Mild intermittent asthma with exacerbation 01/31/2018  . Chronic neck pain 08/27/2017  . Hyperglycemia 03/06/2017  . GERD (gastroesophageal reflux disease) 04/20/2016  . Numbness 08/06/2015  . Osteopenia 12/10/2014  . Allergic urticaria 12/10/2014  . Pressure in head 04/01/2014  . Dizziness 02/10/2014  . Elevated blood pressure reading 11/06/2013  . Hypothyroid   . Primary HSV infection of mouth     Tina Lucas, PT 01/31/2018, 2:05 PM  [facility] [address], [address] [place], [place], [address] Phone: [phone]   Fax:  [phone]  Name: Tina Lucas MRN: [id number] Date of Birth: 05/05/1950   

## 2018-02-05 ENCOUNTER — Ambulatory Visit: Payer: Medicare Other | Admitting: Internal Medicine

## 2018-02-05 ENCOUNTER — Encounter: Payer: Self-pay | Admitting: Internal Medicine

## 2018-02-05 VITALS — BP 146/84 | HR 70 | Temp 98.5°F | Resp 16 | Ht 68.0 in | Wt 128.0 lb

## 2018-02-05 DIAGNOSIS — K219 Gastro-esophageal reflux disease without esophagitis: Secondary | ICD-10-CM | POA: Diagnosis not present

## 2018-02-05 NOTE — Assessment & Plan Note (Signed)
Her symptoms are likely all related to GERD.  At this point I doubt any asthma symptoms Discussed that the GERD may have been flared up by her recent upper respiratory illness or the medications Hold Breo for now Start omeprazole 40 mg daily for 2 weeks-take 30 minutes prior to meal, then take every other day for a week and taper off if symptoms are well controlled Discussed that she may need to take the omeprazole intermittently, but should take it for short period of time and ideally get off of it She will call if there is no improvement Reviewed GERD diet

## 2018-02-05 NOTE — Patient Instructions (Addendum)
Hold the [place].   Start omeprazole 40 mg daily - take 30 minutes prior to a meal.  After two weeks if your heartburn is gone, take every other day for a week and then taper off slowly.       Gastroesophageal Reflux Disease, Adult Gastroesophageal reflux (GER) happens when acid from the stomach flows up into the tube that connects the mouth and the stomach (esophagus). Normally, food travels down the esophagus and stays in the stomach to be digested. However, when a person has GER, food and stomach acid sometimes move back up into the esophagus. If this becomes a more serious problem, the person may be diagnosed with a disease called gastroesophageal reflux disease (GERD). GERD occurs when the reflux:  Happens often.  Causes frequent or severe symptoms.  Causes problems such as damage to the esophagus. When stomach acid comes in contact with the esophagus, the acid may cause soreness (inflammation) in the esophagus. Over time, GERD may create small holes (ulcers) in the lining of the esophagus. What are the causes? This condition is caused by a problem with the muscle between the esophagus and the stomach (lower esophageal sphincter, or LES). Normally, the LES muscle closes after food passes through the esophagus to the stomach. When the LES is weakened or abnormal, it does not close properly, and that allows food and stomach acid to go back up into the esophagus. The LES can be weakened by certain dietary substances, medicines, and medical conditions, including:  Tobacco use.  Pregnancy.  Having a hiatal hernia.  Alcohol use.  Certain foods and beverages, such as coffee, chocolate, onions, and peppermint. What increases the risk? You are more likely to develop this condition if you:  Have an increased body weight.  Have a connective tissue disorder.  Use NSAID medicines. What are the signs or symptoms? Symptoms of this condition include:  Heartburn.  Difficult or painful  swallowing.  The feeling of having a lump in the throat.  Abitter taste in the mouth.  Bad breath.  Having a large amount of saliva.  Having an upset or bloated stomach.  Belching.  Chest pain. Different conditions can cause chest pain. Make sure you see your health care provider if you experience chest pain.  Shortness of breath or wheezing.  Ongoing (chronic) cough or a night-time cough.  Wearing away of tooth enamel.  Weight loss. How is this diagnosed? Your health care provider will take a medical history and perform a physical exam. To determine if you have mild or severe GERD, your health care provider may also monitor how you respond to treatment. You may also have tests, including:  A test to examine your stomach and esophagus with a small camera (endoscopy).  A test thatmeasures the acidity level in your esophagus.  A test thatmeasures how much pressure is on your esophagus.  A barium swallow or modified barium swallow test to show the shape, size, and functioning of your esophagus. How is this treated? The goal of treatment is to help relieve your symptoms and to prevent complications. Treatment for this condition may vary depending on how severe your symptoms are. Your health care provider may recommend:  Changes to your diet.  Medicine.  Surgery. Follow these instructions at home: Eating and drinking   Follow a diet as recommended by your health care provider. This may involve avoiding foods and drinks such as: ? Coffee and tea (with or without caffeine). ? Drinks that containalcohol. ? Energy drinks and  sports drinks. ? Carbonated drinks or sodas. ? Chocolate and cocoa. ? Peppermint and mint flavorings. ? Garlic and onions. ? Horseradish. ? Spicy and acidic foods, including peppers, chili powder, curry powder, vinegar, hot sauces, and barbecue sauce. ? Citrus fruit juices and citrus fruits, such as oranges, lemons, and limes. ? Tomato-based  foods, such as red sauce, chili, salsa, and pizza with red sauce. ? Fried and fatty foods, such as donuts, french fries, potato chips, and high-fat dressings. ? High-fat meats, such as hot dogs and fatty cuts of red and white meats, such as rib eye steak, sausage, ham, and bacon. ? High-fat dairy items, such as whole milk, butter, and cream cheese.  Eat small, frequent meals instead of large meals.  Avoid drinking large amounts of liquid with your meals.  Avoid eating meals during the 2-3 hours before bedtime.  Avoid lying down right after you eat.  Do not exercise right after you eat. Lifestyle   Do not use any products that contain nicotine or tobacco, such as cigarettes, e-cigarettes, and chewing tobacco. If you need help quitting, ask your health care provider.  Try to reduce your stress by using methods such as yoga or meditation. If you need help reducing stress, ask your health care provider.  If you are overweight, reduce your weight to an amount that is healthy for you. Ask your health care provider for guidance about a safe weight loss goal. General instructions  Pay attention to any changes in your symptoms.  Take over-the-counter and prescription medicines only as told by your health care provider. Do not take aspirin, ibuprofen, or other NSAIDs unless your health care provider told you to do so.  Wear loose-fitting clothing. Do not wear anything tight around your waist that causes pressure on your abdomen.  Raise (elevate) the head of your bed about 6 inches (15 cm).  Avoid bending over if this makes your symptoms worse.  Keep all follow-up visits as told by your health care provider. This is important. Contact a health care provider if:  You have: ? New symptoms. ? Unexplained weight loss. ? Difficulty swallowing or it hurts to swallow. ? Wheezing or a persistent cough. ? A hoarse voice.  Your symptoms do not improve with treatment. Get help right away if  you:  Have pain in your arms, neck, jaw, teeth, or back.  Feel sweaty, dizzy, or light-headed.  Have chest pain or shortness of breath.  Vomit and your vomit looks like blood or coffee grounds.  Faint.  Have stool that is bloody or black.  Cannot swallow, drink, or eat. Summary  Gastroesophageal reflux happens when acid from the stomach flows up into the esophagus. GERD is a disease in which the reflux happens often, causes frequent or severe symptoms, or causes problems such as damage to the esophagus.  Treatment for this condition may vary depending on how severe your symptoms are. Your health care provider may recommend diet and lifestyle changes, medicine, or surgery.  Contact a health care provider if you have new or worsening symptoms.  Take over-the-counter and prescription medicines only as told by your health care provider. Do not take aspirin, ibuprofen, or other NSAIDs unless your health care provider told you to do so.  Keep all follow-up visits as told by your health care provider. This is important. This information is not intended to replace advice given to you by your health care provider. Make sure you discuss any questions you have with your health  care provider. Document Released: 10/19/2004 Document Revised: 07/18/2017 Document Reviewed: 07/18/2017 Elsevier Interactive Patient Education  2019 [employer].

## 2018-02-05 NOTE — Progress Notes (Signed)
Subjective:    Patient ID: Tina Lucas, female    DOB: 01-Sep-1950, 68 y.o.   MRN: [id number]  HPI The patient is here for an acute visit.  She has been taking Breo ( took for five days) and she is still having chest tightness and discomfort.  The inhaler is not working.  She has had heartburn and in the past these symptoms were similar to then.  She is wondering if her current symptoms are related to heartburn and not the asthma.  She feels it at rest and when she breathes.  She feels a weakness in her chest.  It is worse with intense exercise.  Eating makes it worse - she noticed it today.  She still has a mild dry cough and has had a cough with brushing teeth.   She did not take the [facility] today.  She is unsure if she is having some asthma symptoms or if this is all heartburn.  In the past she has taken omeprazole and was wondering if she should take that.  Medications and allergies reviewed with patient and updated if appropriate.  Patient Active Problem List   Diagnosis Date Noted  . Asthma 01/31/2018  . Mild intermittent asthma with exacerbation 01/31/2018  . Chronic neck pain 08/27/2017  . Hyperglycemia 03/06/2017  . GERD (gastroesophageal reflux disease) 04/20/2016  . Numbness 08/06/2015  . Osteopenia 12/10/2014  . Allergic urticaria 12/10/2014  . Pressure in head 04/01/2014  . Dizziness 02/10/2014  . Elevated blood pressure reading 11/06/2013  . Hypothyroid   . Primary HSV infection of mouth     Current Outpatient Medications on File Prior to Visit  Medication Sig Dispense Refill  . Calcium Carbonate-Vitamin D (CALTRATE 600+D) 600-400 MG-UNIT per tablet Take 1 tablet by mouth daily.    . celecoxib (CELEBREX) 100 MG capsule TAKE ONE CAPSULE BY MOUTH TWICE A DAY (Patient taking differently: TAKE ONE CAPSULE BY MOUTH ONCE A DAY) 60 capsule 4  . cholecalciferol (VITAMIN D) 400 UNITS TABS tablet Take 400 Units by mouth.    . fexofenadine (ALLEGRA) 180 MG tablet Take 180 mg  by mouth daily.    . fluocinonide cream (LIDEX) 3.71 % Apply 1 application topically 2 (two) times daily as needed (FOR PSORIASIS).     . fluticasone furoate-vilanterol (BREO ELLIPTA) 100-25 MCG/INH AEPB Inhale 1 puff into the lungs daily. 28 each 5  . levothyroxine (SYNTHROID, LEVOTHROID) 50 MCG tablet TAKE ONE TABLET BY MOUTH EVERY MORNING BEFORE BREAKFAST 90 tablet 1  . Magnesium 250 MG TABS Take 125 mg by mouth daily.    . Multiple Vitamins-Minerals (ONE-A-DAY WOMENS 50+ ADVANTAGE PO) Take by mouth daily.    . prednisoLONE acetate (PRED FORTE) 1 % ophthalmic suspension Place 1 drop into the right eye daily.     . Probiotic Product (PROBIOTIC DAILY) CAPS Take 1 capsule by mouth daily.     No current facility-administered medications on file prior to visit.     Past Medical History:  Diagnosis Date  . Allergic rhinitis   . 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 History of asthma 1956-2000  . Hypothyroid 2013  . Primary HSV infection of mouth 2008  . 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 TONSILLECTOMY  1958    Social History   Socioeconomic History  . Marital status: Married    Spouse name: Not  on file  . Number of children: Not on file  . Years of education: Not on file  . Highest education level: Not on file  Occupational History  . Not on file  Social Needs  . Financial resource strain: Not on file  . Food insecurity:    Worry: Not on file    Inability: Not on file  . Transportation needs:    Medical: Not on file    Non-medical: Not on file  Tobacco Use  . Smoking status: Never Smoker  . Smokeless tobacco: Never Used  Substance and Sexual Activity  . Alcohol use: Yes    Alcohol/week: 0.0 standard drinks  . Drug use: No  . Sexual activity: Not on file  Lifestyle  . Physical activity:    Days per week: Not on file     Minutes per session: Not on file  . Stress: Not on file  Relationships  . Social connections:    Talks on phone: Not on file    Gets together: Not on file    Attends religious service: Not on file    Active member of club or organization: Not on file    Attends meetings of clubs or organizations: Not on file    Relationship status: Not on file  Other Topics Concern  . Not on file  Social History Narrative   Lives with husband in a one story home.  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    Family History  Problem Relation Age of Onset  . Breast cancer Mother 62  . Osteoarthritis Father   . Hypertension Father   . Leukemia Maternal Aunt 61  . Atrial fibrillation Brother     Review of Systems  Constitutional: Negative for fever.  Respiratory: Positive for cough (dry). Negative for shortness of breath and wheezing.   Cardiovascular: Positive for chest pain (discomfort).  Gastrointestinal: Negative for nausea.       Gerd, burping       Objective:   Vitals:   02/05/18 1418  BP: (!) 146/84  Pulse: 70  Resp: 16  Temp: 98.5 F (36.9 C)  SpO2: 98%   BP Readings from Last 3 Encounters:  02/05/18 (!) 146/84  01/31/18 (!) 152/78  01/15/18 (!) 142/84   Wt Readings from Last 3 Encounters:  02/05/18 128 lb (58.1 kg)  01/31/18 128 lb 1.9 oz (58.1 kg)  01/15/18 129 lb (58.5 kg)   Body mass index is 19.46 kg/m.   Physical Exam    Constitutional: Appears well-developed and well-nourished. No distress.  HENT:  Head: Normocephalic and atraumatic.  Neck: Neck supple. No tracheal deviation present. No thyromegaly present.  No cervical lymphadenopathy Cardiovascular: Normal rate, regular rhythm and normal heart sounds.   No murmur heard.  No edema Pulmonary/Chest: Effort normal and breath sounds normal. No respiratory distress. No has no wheezes. No rales.  Abdomen: Soft, nontender, nondistended Skin: Skin is warm and dry. Not diaphoretic.  Psychiatric:  Normal mood and affect. Behavior is normal.       Assessment & Plan:    See Problem List for Assessment and Plan of chronic medical problems.

## 2018-02-14 ENCOUNTER — Encounter: Payer: Self-pay | Admitting: Physical Therapy

## 2018-02-20 ENCOUNTER — Ambulatory Visit: Payer: Medicare Other | Admitting: Physical Therapy

## 2018-02-21 ENCOUNTER — Ambulatory Visit: Payer: Medicare Other | Admitting: Physical Therapy

## 2018-02-21 ENCOUNTER — Encounter: Payer: Self-pay | Admitting: Physical Therapy

## 2018-02-21 DIAGNOSIS — M62838 Other muscle spasm: Secondary | ICD-10-CM | POA: Diagnosis not present

## 2018-02-21 DIAGNOSIS — M542 Cervicalgia: Secondary | ICD-10-CM

## 2018-02-21 NOTE — Therapy (Signed)
[facility] [address], [place], [place], [address] Phone: [phone]   Fax:  [phone]  Physical Therapy Treatment Progress Note Reporting Period 10/26/2018 to 02/22/18   See note below for Objective Data and Assessment of Progress/Goals.      Patient Details  Name: Tina Lucas MRN: [id number] Date of Birth: January 29, 1950 Referring Provider (PT): Binnie Rail, MD   Encounter Date: 02/21/2018  PT End of Session - 02/21/18 1021    Visit Number  20    Number of Visits  20    Date for PT Re-Evaluation  04/04/18    Authorization Type  [employer] medicare    PT Start Time  1019    PT Stop Time  1100    PT Time Calculation (min)  41 min    Activity Tolerance  Patient tolerated treatment well    Behavior During Therapy  WFL for tasks assessed/performed       Past Medical History:  Diagnosis Date  . Allergic rhinitis   . 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 History of asthma 1956-2000  . Hypothyroid 2013  . Primary HSV infection of mouth 2008  . 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 TONSILLECTOMY  1958    There were no vitals filed for this visit.  Subjective Assessment - 02/21/18 1021    Subjective  I was able to cancel the last appointment because I was feeling okay.  The pain was down to zero and now I am feeling it.  Now the back of the head is hurting and I rolled over in bed and felt dizzy which I haven't had for a while.    Patient Stated Goals  reduce pain and dizziness to return to pickle ball    Currently in Pain?  Yes    Pain Score  6     Pain Location  Head    Pain Orientation  Posterior;Lower    Pain Descriptors / Indicators  Aching    Pain Type  Chronic pain    Pain Onset  More than a month ago    Pain Frequency  Intermittent                        [facility] Adult PT Treatment/Exercise - 02/22/18 0001      Manual Therapy   Myofascial Release  thoracic inlet, cranial fascial release, parietal, temporal and sphenoid fascial release               PT Short Term Goals - 01/31/18 1401      PT SHORT TERM GOAL #1   Title  pt will be able to make it through 2 weeks of normal activity with at most one episode of pain in the back of her head    Baseline  no episodes since last visit    Status  Achieved        PT Long Term Goals - 02/22/18 1244      PT LONG TERM GOAL #1   Title  pt will be ind with advanced HEP    Status  On-going      PT LONG TERM GOAL #4   Title  pt demonstrates improved cervical rotation to 65 deg bilaterally for improved ability to navigate traffic while driving    Baseline  60 deg right;  65 left    Status  Partially Met      PT LONG TERM GOAL #7   Title  Pt will report no heaviness or headache feeling in the back of the head for >1 day/month    Baseline  has 2-3x/month but needed release of tissue tension 1x over the last several weeks    Status  On-going            Plan - 02/22/18 1241    Clinical Impression Statement  Pt has been able to space out treatments and going symptom free for longer stretches of time.  She is expected to continue to improve with myofascial release every other week for several more weeks so she can continue to do her normal activities such as walkingand pickle ball.  Ptcontinues to make progress towards her goals and is recommended to continue skilled PT at this time.     PT Treatment/Interventions  ADLs/Self Care Home Management;Biofeedback;Cryotherapy;Electrical Stimulation;Iontophoresis 4mg/ml Dexamethasone;Moist Heat;Ultrasound;Therapeutic activities;Therapeutic exercise;Balance training;Neuromuscular re-education;Patient/family education;Manual techniques;Taping;Dry needling;Passive range of motion    PT Next Visit Plan  myofascial  release for 1-2 more visits with tapering frequency of visits    PT Home Exercise Plan  [id number]     Consulted and Agree with Plan of Care  Patient       Patient will benefit from skilled therapeutic intervention in order to improve the following deficits and impairments:  Pain, Dizziness, Increased fascial restricitons, Decreased strength, Decreased range of motion, Postural dysfunction, Increased muscle spasms  Visit Diagnosis: Other muscle spasm  Cervicalgia     Problem List Patient Active Problem List   Diagnosis Date Noted  . Asthma 01/31/2018  . Mild intermittent asthma with exacerbation 01/31/2018  . Chronic neck pain 08/27/2017  . Hyperglycemia 03/06/2017  . GERD (gastroesophageal reflux disease) 04/20/2016  . Numbness 08/06/2015  . Osteopenia 12/10/2014  . Allergic urticaria 12/10/2014  . Pressure in head 04/01/2014  . Dizziness 02/10/2014  . Elevated blood pressure reading 11/06/2013  . Hypothyroid   . Primary HSV infection of mouth     Tina Lucas, PT 02/22/2018, 12:45 PM  [facility] [address], [address] [place], [place], [address] Phone: [phone]   Fax:  [phone]  Name: Tina Lucas MRN: [id number] Date of Birth: 08/08/1950   

## 2018-02-25 NOTE — Progress Notes (Signed)
Subjective:    Patient ID: Tina Lucas, female    DOB: Dec 01, 1950, 68 y.o.   MRN: [id number]  HPI The patient is here for follow up.  She continues to have the abnormal feeling in her chest that she thought was GERD.  She took the omeprazole for 13 days, but does not like taking it.  In the past it worked after a few days.    She has not had any improvement in her heartburn in 13 days with taking the omeprazole 20 mg daily.  She stopped drinking wine for a while, but when she does drink it was minimal.  She has very little coughing and no wheezing.  She still feels like she has a heartburn issue - some burning feeling and ache when sitting that is worse after playing pickleball.   She denies any chest symptoms with playing pickleball.  She tried taking the Breo at times and sometimes it helps and sometimes it does not.    She is not sure what to do now.   Her current symptoms she describes as a substernal burning.  She feels it with breathing. She denies wheezing.  She feels it a little after eating.  She burps sometimes.  She denies nausea.  She is unsure if it is worse with movement.  She denies cold symptoms.     Medications and allergies reviewed with patient and updated if appropriate.  Patient Active Problem List   Diagnosis Date Noted  . Asthma 01/31/2018  . Mild intermittent asthma with exacerbation 01/31/2018  . Chronic neck pain 08/27/2017  . Hyperglycemia 03/06/2017  . GERD (gastroesophageal reflux disease) 04/20/2016  . Numbness 08/06/2015  . Osteopenia 12/10/2014  . Allergic urticaria 12/10/2014  . Pressure in head 04/01/2014  . Dizziness 02/10/2014  . Elevated blood pressure reading 11/06/2013  . Hypothyroid   . Primary HSV infection of mouth     Current Outpatient Medications on File Prior to Visit  Medication Sig Dispense Refill  . Calcium Carbonate-Vitamin D (CALTRATE 600+D) 600-400 MG-UNIT per tablet Take 1 tablet by mouth daily.    . celecoxib (CELEBREX)  100 MG capsule TAKE ONE CAPSULE BY MOUTH TWICE A DAY (Patient taking differently: TAKE ONE CAPSULE BY MOUTH ONCE A DAY) 60 capsule 4  . cholecalciferol (VITAMIN D) 400 UNITS TABS tablet Take 400 Units by mouth.    . fexofenadine (ALLEGRA) 180 MG tablet Take 180 mg by mouth daily.    . fluocinonide cream (LIDEX) 8.65 % Apply 1 application topically 2 (two) times daily as needed (FOR PSORIASIS).     . fluticasone furoate-vilanterol (BREO ELLIPTA) 100-25 MCG/INH AEPB Inhale 1 puff into the lungs daily. 28 each 5  . levothyroxine (SYNTHROID, LEVOTHROID) 50 MCG tablet TAKE ONE TABLET BY MOUTH EVERY MORNING BEFORE BREAKFAST 90 tablet 1  . Magnesium 250 MG TABS Take 125 mg by mouth daily.    . Multiple Vitamins-Minerals (ONE-A-DAY WOMENS 50+ ADVANTAGE PO) Take by mouth daily.    . prednisoLONE acetate (PRED FORTE) 1 % ophthalmic suspension Place 1 drop into the right eye daily.     . Probiotic Product (PROBIOTIC DAILY) CAPS Take 1 capsule by mouth daily.     No current facility-administered medications on file prior to visit.     Past Medical History:  Diagnosis Date  . Allergic rhinitis   . 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 History of asthma 1956-2000  . Hypothyroid 2013  . Primary  HSV infection of mouth 2008  . 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 TONSILLECTOMY  1958    Social History   Socioeconomic History  . Marital status: Married    Spouse name: Not on file  . Number of children: Not on file  . Years of education: Not on file  . Highest education level: Not on file  Occupational History  . Not on file  Social Needs  . Financial resource strain: Not on file  . Food insecurity:    Worry: Not on file    Inability: Not on file  . Transportation needs:    Medical: Not on file    Non-medical: Not on file  Tobacco Use  .  Smoking status: Never Smoker  . Smokeless tobacco: Never Used  Substance and Sexual Activity  . Alcohol use: Yes    Alcohol/week: 0.0 standard drinks  . Drug use: No  . Sexual activity: Not on file  Lifestyle  . Physical activity:    Days per week: Not on file    Minutes per session: Not on file  . Stress: Not on file  Relationships  . Social connections:    Talks on phone: Not on file    Gets together: Not on file    Attends religious service: Not on file    Active member of club or organization: Not on file    Attends meetings of clubs or organizations: Not on file    Relationship status: Not on file  Other Topics Concern  . Not on file  Social History Narrative   Lives with husband in a one story home.  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    Family History  Problem Relation Age of Onset  . Breast cancer Mother 13  . Osteoarthritis Father   . Hypertension Father   . Leukemia Maternal Aunt 37  . Atrial fibrillation Brother     Review of Systems  Constitutional: Negative for chills and fever.  HENT: Negative for congestion, postnasal drip, sinus pain and trouble swallowing.   Respiratory: Positive for cough (occasional, dry). Negative for shortness of breath and wheezing.   Cardiovascular: Positive for chest pain.  Gastrointestinal: Negative for nausea.       Objective:   Vitals:   02/26/18 1508  BP: (!) 144/80  Pulse: 62  Resp: 16  Temp: 98.2 F (36.8 C)  SpO2: 98%   BP Readings from Last 3 Encounters:  02/26/18 (!) 144/80  02/05/18 (!) 146/84  01/31/18 (!) 152/78   Wt Readings from Last 3 Encounters:  02/26/18 127 lb (57.6 kg)  02/05/18 128 lb (58.1 kg)  01/31/18 128 lb 1.9 oz (58.1 kg)   Body mass index is 19.31 kg/m.   Physical Exam    Constitutional: Appears well-developed and well-nourished. No distress.  HENT:  Head: Normocephalic and atraumatic.  Neck: Neck supple. No tracheal deviation present. No thyromegaly present.   No cervical lymphadenopathy Cardiovascular: Normal rate, regular rhythm and normal heart sounds.   No murmur heard. No carotid bruit .  No edema Pulmonary/Chest: can feel symptoms with deep breaths, pain not reproducible.  Effort normal and breath sounds normal. No respiratory distress. No has no wheezes. No rales.  Skin: Skin is warm and dry. Not diaphoretic.  Psychiatric: Normal mood and affect. Behavior is normal.      Assessment & Plan:    See  Problem List for Assessment and Plan of chronic medical problems.

## 2018-02-26 ENCOUNTER — Ambulatory Visit: Payer: Medicare Other | Admitting: Internal Medicine

## 2018-02-26 ENCOUNTER — Encounter: Payer: Self-pay | Admitting: Internal Medicine

## 2018-02-26 VITALS — BP 144/80 | HR 62 | Temp 98.2°F | Resp 16 | Ht 68.0 in | Wt 127.0 lb

## 2018-02-26 DIAGNOSIS — K219 Gastro-esophageal reflux disease without esophagitis: Secondary | ICD-10-CM | POA: Diagnosis not present

## 2018-02-26 DIAGNOSIS — R0789 Other chest pain: Secondary | ICD-10-CM | POA: Insufficient documentation

## 2018-02-26 NOTE — Patient Instructions (Addendum)
A referral was ordered for GI.   Try Tums.  Take the omeprazole every other day to every day.  Try topical medications for your chest pain to see if it is muscular.     You can take tylenol for your chest pain to see if that helps.

## 2018-02-26 NOTE — Assessment & Plan Note (Signed)
Atypical burning sensation at times and other times "just not feeling right" in chest Atypical for cardiac cause ? GERD vs MSK in nature Tums, prilosec QOD Stop Breo Try tylenol, topical arthritis meds Continue celebrex once daily See if symptoms are related to movement and activity Try heat/ice She will update me via [employer]

## 2018-02-26 NOTE — Assessment & Plan Note (Signed)
Some of her symptoms are suggestive of GERD She would prefer not to take the omeprazole, but will agree to take it QOD Will try Tums Will try to discern if it is worse after eating Will refer to GI

## 2018-02-27 ENCOUNTER — Encounter: Payer: Self-pay | Admitting: Gastroenterology

## 2018-03-07 ENCOUNTER — Ambulatory Visit: Payer: Medicare Other | Attending: Internal Medicine | Admitting: Physical Therapy

## 2018-03-07 DIAGNOSIS — M62838 Other muscle spasm: Secondary | ICD-10-CM | POA: Diagnosis present

## 2018-03-07 DIAGNOSIS — M542 Cervicalgia: Secondary | ICD-10-CM | POA: Diagnosis present

## 2018-03-07 NOTE — Therapy (Signed)
[facility] [address], [place], [place], [address] Phone: [phone]   Fax:  [phone]  Physical Therapy Treatment  Patient Details  Name: Tina Lucas MRN: [id number] Date of Birth: July 22, 1950 Referring Provider (PT): Binnie Rail, MD   Encounter Date: 03/07/2018  PT End of Session - 03/07/18 1343    Visit Number  21    Number of Visits  30    Date for PT Re-Evaluation  04/04/18    Authorization Type  [employer] medicare    PT Start Time  1230    PT Stop Time  1312    PT Time Calculation (min)  42 min    Activity Tolerance  Patient tolerated treatment well    Behavior During Therapy  [facility] for tasks assessed/performed       Past Medical History:  Diagnosis Date  . Allergic rhinitis   . 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 History of asthma 1956-2000  . Hypothyroid 2013  . Primary HSV infection of mouth 2008  . 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 TONSILLECTOMY  1958    There were no vitals filed for this visit.  Subjective Assessment - 03/07/18 1313    Subjective  I am still feeling the back of the head and dizzy but the last session helped ward it off and I didn't get the bad vertigo.  It has been better and I am still able to play.    Pertinent History  chronic vertigo, BPPV    Limitations  House hold activities;Other (comment)    Patient Stated Goals  reduce pain and dizziness to return to pickle ball    Currently in Pain?  Yes   heart burn pain   Pain Frequency  Intermittent                       [facility] Adult PT Treatment/Exercise - 03/07/18 0001      Self-Care   Other Self-Care Comments   diaphragmatic breathing with hold and stretch - educated on deep breathing at home to increased ribcage movements      Manual Therapy   Myofascial Release  thoracic inlet, cranial fascial release, diaphragm, bil ribcage and stretch with breathing               PT Short Term Goals - 01/31/18 1401      PT SHORT TERM GOAL #1   Title  pt will be able to make it through 2 weeks of normal activity with at most one episode of pain in the back of her head    Baseline  no episodes since last visit    Status  Achieved        PT Long Term Goals - 02/22/18 1244      PT LONG TERM GOAL #1   Title  pt will be ind with advanced HEP    Status  On-going      PT LONG TERM GOAL #4   Title  pt demonstrates improved cervical rotation to 65 deg bilaterally for improved ability to navigate traffic while driving    Baseline  60 deg right; 65 left    Status  Partially Met      PT LONG TERM GOAL #7   Title  Pt will report no heaviness or headache feeling in the back  of the head for >1 day/month    Baseline  has 2-3x/month but needed release of tissue tension 1x over the last several weeks    Status  On-going            Plan - 03/07/18 1352    Clinical Impression Statement  Pt was able to continue spacing out appointments to every other day.  Pt had good release to suboccipital region, anterior upper ribcage and diaphragm today.  Pt reported feeling less of the heart burn after manual techniques. Pt will benefit from skilled PT to continue working on improved soft tissue and fascial mobilty so she will be able to continue participating in pickel ball.    PT Treatment/Interventions  ADLs/Self Care Home Management;Biofeedback;Cryotherapy;Electrical Stimulation;Iontophoresis 1m/ml Dexamethasone;Moist Heat;Ultrasound;Therapeutic activities;Therapeutic exercise;Balance training;Neuromuscular re-education;Patient/family education;Manual techniques;Taping;Dry needling;Passive range of motion    PT Next Visit Plan  myofascial release for improved thoracic and cervical mobility to return to full participation of pickel ball.    PT Home  Exercise Plan  [id number]    Consulted and Agree with Plan of Care  Patient       Patient will benefit from skilled therapeutic intervention in order to improve the following deficits and impairments:  Pain, Dizziness, Increased fascial restricitons, Decreased strength, Decreased range of motion, Postural dysfunction, Increased muscle spasms  Visit Diagnosis: Other muscle spasm  Cervicalgia     Problem List Patient Active Problem List   Diagnosis Date Noted  . Atypical chest pain 02/26/2018  . Asthma 01/31/2018  . Mild intermittent asthma with exacerbation 01/31/2018  . Chronic neck pain 08/27/2017  . Hyperglycemia 03/06/2017  . GERD (gastroesophageal reflux disease) 04/20/2016  . Numbness 08/06/2015  . Osteopenia 12/10/2014  . Allergic urticaria 12/10/2014  . Pressure in head 04/01/2014  . Dizziness 02/10/2014  . Elevated blood pressure reading 11/06/2013  . Hypothyroid   . Primary HSV infection of mouth     Tina Lucas PT 03/07/2018, 2:01 PM  [facility] [address] [place] [address]: [phone]  Fax:  [phone] Name: Tina FinkelMRN: [id number] of Birth: [birth year]

## 2018-03-12 ENCOUNTER — Encounter: Payer: Self-pay | Admitting: Gastroenterology

## 2018-03-12 ENCOUNTER — Ambulatory Visit: Payer: Medicare Other | Admitting: Gastroenterology

## 2018-03-12 ENCOUNTER — Ambulatory Visit (INDEPENDENT_AMBULATORY_CARE_PROVIDER_SITE_OTHER)
Admission: RE | Admit: 2018-03-12 | Discharge: 2018-03-12 | Disposition: A | Discharge status: Home / Self Care | Payer: Medicare Other | Source: Ambulatory Visit | Attending: Gastroenterology | Admitting: Gastroenterology

## 2018-03-12 VITALS — BP 126/78 | HR 58 | Ht 68.0 in | Wt 127.0 lb

## 2018-03-12 DIAGNOSIS — J209 Acute bronchitis, unspecified: Secondary | ICD-10-CM

## 2018-03-12 DIAGNOSIS — Z8709 Personal history of other diseases of the respiratory system: Secondary | ICD-10-CM | POA: Diagnosis not present

## 2018-03-12 DIAGNOSIS — K219 Gastro-esophageal reflux disease without esophagitis: Secondary | ICD-10-CM

## 2018-03-12 DIAGNOSIS — R12 Heartburn: Secondary | ICD-10-CM | POA: Diagnosis not present

## 2018-03-12 DIAGNOSIS — R0789 Other chest pain: Secondary | ICD-10-CM

## 2018-03-12 MED ORDER — ESOMEPRAZOLE MAGNESIUM 40 MG PO CPDR: 40.0000 mg | DELAYED_RELEASE_CAPSULE | Freq: Two times a day (BID) | ORAL | 2 refills | Status: DC

## 2018-03-12 NOTE — Progress Notes (Signed)
[place] VISIT   Primary Care Provider Binnie Rail, MD [place] [address] [phone]  Referring Provider Binnie Rail, MD [address] [place], [place] [address] [phone]  Patient Profile: Tina Lucas is a 68 y.o. female with a pmh significant for hypothyroidism, allergic rhinitis, arthralgias.  The patient presents to the [facility] for an evaluation and management of problem(s) noted below:  Problem List 1. Pyrosis   2. Atypical chest pain   3. History of bronchitis     History of Present Illness: This is the patient's first visit to the [facility] outpatient clinic.  The patient states that after a bout of treated bronchitis she has had progressive issues of pyrosis and atypical chest discomfort.  This discomfort is burning at times and substernal.  She is a professional Doctor, general practice and previous Designer, industrial/product.  Over the course of the last few weeks there is atypical chest discomfort/burning sensation has caused her significant issues.  She was initially placed on Breo and then over the course the next few weeks transition to a lower dose of Breo added titration.  She had increased issues of burning and is having some reflux.  She describes eating frequently having a difficulty with passing her saliva but she is not clear if this is longer standing and if she is just making herself try and find symptoms for what is going on currently.  She describes no true solid food or liquid dysphagia.  The symptoms of burning can last for hours and had never clearly abated over the course the last few weeks.  In 2018 she took approximately 3 weeks worth of omeprazole for what was felt to be GERD at the time and that helped.  When she was put on omeprazole once daily during the last few weeks she did not feel that there was a significant amount of effectiveness.  She did not describe any significant globus.   She denies ever having a endoscopy.  She has had a previous colonoscopy and subsequently had a 2017 Cologuard for which she has an upcoming 2020: To be performed by her primary care provider.  She does not have a family history significant for GI malignancies.  GI Review of Systems Positive as above Negative for postprandial cough, nocturnal cough, change in bowel habits, melena, hematochezia  Review of Systems General: Denies fevers/chills/weight loss HEENT: Denies current oral lesions however has had some the past and per documentation in the chart suggestive of HSV related issues in the past Cardiovascular: Denies palpitations and denies exertional chest pain Pulmonary: Denies shortness of breath Gastroenterological: See HPI Genitourinary: Denies darkened urine Hematological: Denies easy bruising/bleeding Dermatological: Denies jaundice Psychological: Mood is anxious Musculoskeletal: Denies new arthralgias and only takes Celebrex once daily if needed   Medications Current Outpatient Medications  Medication Sig Dispense Refill  . Calcium Carbonate-Vitamin D (CALTRATE 600+D) 600-400 MG-UNIT per tablet Take 1 tablet by mouth daily.    . celecoxib (CELEBREX) 100 MG capsule TAKE ONE CAPSULE BY MOUTH TWICE A DAY (Patient taking differently: TAKE ONE CAPSULE BY MOUTH ONCE A DAY) 60 capsule 4  . cholecalciferol (VITAMIN D) 400 UNITS TABS tablet Take 400 Units by mouth.    . fexofenadine (ALLEGRA) 180 MG tablet Take 180 mg by mouth daily.    . fluocinonide cream (LIDEX) 4.00 % Apply 1 application topically 2 (two) times daily as needed (FOR PSORIASIS).     . fluticasone  furoate-vilanterol (BREO ELLIPTA) 100-25 MCG/INH AEPB Inhale 1 puff into the lungs daily. (Patient taking differently: Inhale 1 puff into the lungs as needed. ) 28 each 5  . levothyroxine (SYNTHROID, LEVOTHROID) 50 MCG tablet TAKE ONE TABLET BY MOUTH EVERY MORNING BEFORE BREAKFAST 90 tablet 1  . Magnesium 250 MG TABS Take 125  mg by mouth daily.    . Multiple Vitamins-Minerals (ONE-A-DAY WOMENS 50+ ADVANTAGE PO) Take by mouth daily.    . prednisoLONE acetate (PRED FORTE) 1 % ophthalmic suspension Place 1 drop into the right eye daily. Pt takes 3 to 4 times per week    . Probiotic Product (PROBIOTIC DAILY) CAPS Take 1 capsule by mouth daily. Pt is taking 3 to 4 times a week    . esomeprazole (NEXIUM) 40 MG capsule Take 1 capsule (40 mg total) by mouth 2 (two) times daily. 60 capsule 2   No current facility-administered medications for this visit.     Allergies No Active Allergies  Histories Past Medical History:  Diagnosis Date  . Allergic rhinitis   . Asthma    as a child  . 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 History of asthma 1956-2000  . Hypothyroid 2013  . Primary HSV infection of mouth 2008  . 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 TONSILLECTOMY  1958   Social History   Socioeconomic History  . Marital status: Married    Spouse name: Not on file  . Number of children: Not on file  . Years of education: Not on file  . Highest education level: Not on file  Occupational History  . Not on file  Social Needs  . Financial resource strain: Not on file  . Food insecurity:    Worry: Not on file    Inability: Not on file  . Transportation needs:    Medical: Not on file    Non-medical: Not on file  Tobacco Use  . Smoking status: Never Smoker  . Smokeless tobacco: Never Used  Substance and Sexual Activity  . Alcohol use: Yes    Alcohol/week: 0.0 standard drinks    Comment: socially  . Drug use: No  . Sexual activity: Not on file  Lifestyle  . Physical activity:    Days per week: Not on file    Minutes per session: Not on file  . Stress: Not on file  Relationships  . Social connections:    Talks on phone: Not on file    Gets together: Not  on file    Attends religious service: Not on file    Active member of club or organization: Not on file    Attends meetings of clubs or organizations: Not on file    Relationship status: Not on file  . Intimate partner violence:    Fear of current or ex partner: Not on file    Emotionally abused: Not on file    Physically abused: Not on file    Forced sexual activity: Not on file  Other Topics Concern  . Not on file  Social History Narrative   Lives with husband in a one story home.  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   Family History  Problem Relation Age of Onset  . Breast cancer Mother 21  . Osteoarthritis Father   . Hypertension Father   . Leukemia Maternal Aunt 52  .  Atrial fibrillation Brother   . Colon cancer Neg Hx   . Esophageal cancer Neg Hx   . Inflammatory bowel disease Neg Hx   . Liver disease Neg Hx   . Pancreatic cancer Neg Hx   . Rectal cancer Neg Hx   . Stomach cancer Neg Hx    I have reviewed her medical, social, and family history in detail and updated the electronic medical record as necessary.    PHYSICAL EXAMINATION  BP 126/78   Pulse (!) 58   Ht 5\' 8"  (1.727 m)   Wt 127 lb (57.6 kg)   BMI 19.31 kg/m  Wt Readings from Last 3 Encounters:  03/12/18 127 lb (57.6 kg)  02/26/18 127 lb (57.6 kg)  02/05/18 128 lb (58.1 kg)  GEN: NAD, appears stated age, doesn't appear chronically ill, accompanied by husband PSYCH: Cooperative, without pressured speech EYE: Conjunctivae pink, sclerae anicteric ENT: MMM, without oral ulcers, no erythema or exudates noted NECK: Supple CV: RR without R/Gs  RESP: CTAB posteriorly, without wheezing GI: NABS, soft, NT/ND, without rebound or guarding, no HSM appreciated MSK/EXT: No lower extremity edema SKIN: No jaundice NEURO:  Alert & Oriented x 3, no focal deficits   REVIEW OF DATA  I reviewed the following data at the time of this encounter:  GI Procedures and Studies  No relevant studies to  review  Laboratory Studies  Reviewed in epic  Imaging Studies  April 2018 CT coronary chest IMPRESSION: 1. Coronary calcium score of 46. This was 48 percentile for age and sex matched control. 2. Normal coronary origin with right dominance. 3. Mild non-obstructive CAD. Ann aggressive risk factor modification is recommended. 4. Moderate concentric LVH. 5. Mildly dilated pulmonary artery measuring 30 x 25 mm.   ASSESSMENT  Ms. Moose is a 68 y.o. female with a pmh significant for hypothyroidism, allergic rhinitis, arthralgias.  The patient is seen today for evaluation and management of:  1. Pyrosis   2. Atypical chest pain   3. History of bronchitis    The patient is hemodynamically stable and presenting for further evaluation of atypical chest pain with pyrosis and concern for underlying GERD.  The new onset symptoms after her recent "bronchitis flare" are concerning about whether she may have a postinfectious process occurring.  However not improvement with once daily PPI.  A bit concerning.  She has no other significant red flag symptoms.  She has had a 2018 coronary CT that did not suggest significant CAD and she is very, very active playing pickle ball and being out and about.  I think her chest discomfort is noncardiac at this point but will need to be exonerated if we do not find a source on our work-up.  Atypical GERD and GERD occurring after the age of 8 is obviously something we would want to evaluate in the upper GI tract ensure there is no masses or lesions and understand she has significant esophagitis.  We will transition her medication to a different PPI to be done twice daily in the interim.  Plan for an upper endoscopy.  If the patient's endoscopy and/symptoms do not improve it may be worthwhile to consider a pH impedance testing off PPI to truly understand whether the patient even has true acid reflux depending on the findings of our work-up above.   I think obtaining a chest  x-ray as well since she has had this persistent discomfort is reasonable and we will plan on ordering that today.  The risks and  benefits of endoscopic evaluation were discussed with the patient; these include but are not limited to the risk of perforation, infection, bleeding, missed lesions, lack of diagnosis, severe illness requiring hospitalization, as well as anesthesia and sedation related illnesses.  The patient is agreeable to proceed.  All patient questions were answered, to the best of my ability, and the patient agrees to the aforementioned plan of action with follow-up as indicated.   PLAN  Chest x-ray rule out lung pathology for her atypical chest discomfort Begin Nexium 40 mg twice daily and stop omeprazole Diagnostic upper endoscopy (esophageal/gastric biopsies to be obtained) Further work-up and management including cross-sectional imaging of the chest may be considered dependent on the work-up above We will query possible pH impedance testing in the future   Orders Placed This Encounter  Procedures  . DG Chest 2 View  . Ambulatory referral to Gastroenterology    New Prescriptions   ESOMEPRAZOLE (NEXIUM) 40 MG CAPSULE    Take 1 capsule (40 mg total) by mouth 2 (two) times daily.   Modified Medications   No medications on file    Planned Follow Up: No follow-ups on file.   Justice Britain, MD [facility] Advanced [facility]oscopy Office # [phone]

## 2018-03-12 NOTE — Patient Instructions (Addendum)
If you are age 69 or older, your body mass index should be between 23-30. Your Body mass index is 19.31 kg/m. If this is out of the aforementioned range listed, please consider follow up with your Primary Care Provider.  If you are age 60 or younger, your body mass index should be between 19-25. Your Body mass index is 19.31 kg/m. If this is out of the aformentioned range listed, please consider follow up with your Primary Care Provider.   You have been scheduled for an endoscopy. Please follow written instructions given to you at your visit today. If you use inhalers (even only as needed), please bring them with you on the day of your procedure. Your physician has requested that you go to www.startemmi.com and enter the access code given to you at your visit today. This web site gives a general overview about your procedure. However, you should still follow specific instructions given to you by our office regarding your preparation for the procedure.  We have sent the following medications to your pharmacy for you to pick up at your convenience: Nexium   Chest Xray has been order.  Please go to the basement of our office for Chest X-ray.  Thank you for choosing me and [facility].  Dr. Rush Landmark

## 2018-03-15 ENCOUNTER — Encounter: Payer: Self-pay | Admitting: Gastroenterology

## 2018-03-15 ENCOUNTER — Ambulatory Visit (AMBULATORY_SURGERY_CENTER): Payer: Medicare Other | Admitting: Gastroenterology

## 2018-03-15 VITALS — BP 124/76 | HR 71 | Temp 98.0°F | Resp 20 | Ht 68.0 in | Wt 127.0 lb

## 2018-03-15 DIAGNOSIS — K219 Gastro-esophageal reflux disease without esophagitis: Secondary | ICD-10-CM | POA: Diagnosis not present

## 2018-03-15 DIAGNOSIS — K259 Gastric ulcer, unspecified as acute or chronic, without hemorrhage or perforation: Secondary | ICD-10-CM

## 2018-03-15 DIAGNOSIS — R0789 Other chest pain: Secondary | ICD-10-CM

## 2018-03-15 DIAGNOSIS — K2970 Gastritis, unspecified, without bleeding: Secondary | ICD-10-CM

## 2018-03-15 DIAGNOSIS — K317 Polyp of stomach and duodenum: Secondary | ICD-10-CM | POA: Diagnosis not present

## 2018-03-15 DIAGNOSIS — R12 Heartburn: Secondary | ICD-10-CM

## 2018-03-15 MED ORDER — SODIUM CHLORIDE 0.9 % IV SOLN: 500.0000 mL | Freq: Once | INTRAVENOUS | Status: DC

## 2018-03-15 NOTE — Op Note (Signed)
[place] Patient Name: Tina Lucas Procedure Date: 03/15/2018 12:54 PM MRN: [id number] Endoscopist: Justice Britain , MD Age: 68 Referring MD:  Date of Birth: 22-Aug-1950 Gender: Female Account #: [id number] Procedure:                Upper GI endoscopy Indications:              Epigastric abdominal pain, Heartburn, Exclusion of                            gastro-esophageal reflux disease, Chest pain (non                            cardiac) Medicines:                Monitored Anesthesia Care Procedure:                Pre-Anesthesia Assessment:                           - Prior to the procedure, a History and Physical                            was performed, and patient medications and                            allergies were reviewed. The patient's tolerance of                            previous anesthesia was also reviewed. The risks                            and benefits of the procedure and the sedation                            options and risks were discussed with the patient.                            All questions were answered, and informed consent                            was obtained. Prior Anticoagulants: The patient has                            taken no previous anticoagulant or antiplatelet                            agents. ASA Grade Assessment: II - A patient with                            mild systemic disease. After reviewing the risks                            and benefits, the patient was deemed in  satisfactory condition to undergo the procedure.                           After obtaining informed consent, the endoscope was                            passed under direct vision. Throughout the                            procedure, the patient's blood pressure, pulse, and                            oxygen saturations were monitored continuously. The                            Endoscope was introduced through the  mouth, and                            advanced to the second part of duodenum. The upper                            GI endoscopy was accomplished without difficulty.                            The patient tolerated the procedure. Scope In: Scope Out: Findings:                 A single 4 mm yellowish nodule was found in the                            distal esophagus, 37 cm from the incisors. Biopsies                            were taken with a cold forceps for histology.                           A single 4 mm polypoid lesion was found at the                            gastroesophageal junction. Biopsies were taken with                            a cold forceps for histology.                           Normal mucosa was found in the entire esophagus                            otherwise. Biopsies were taken with a cold forceps                            for histology from the proximal/middle esophagus  for EoE. Biopsies were taken with a cold forceps                            for histology from the distal esophagus for EoE.                           Multiple diminutive sessile polyps with no bleeding                            and no stigmata of recent bleeding were found in                            the gastric body - typical fundic gland appearing                            polyps. Biopsies were taken with a cold forceps for                            histology from a few.                           Patchy moderately erythematous mucosa was found in                            the gastric antrum and in the prepyloric region of                            the stomach.                           One non-bleeding superficial gastric ulcer with a                            clean ulcer base (Forrest Class III) was found in                            the prepyloric region of the stomach. The lesion                            was 6 mm in largest dimension.                            A few localized, small non-bleeding erosions were                            found in the prepyloric region of the stomach.                            There were no stigmata of recent bleeding.                           No other gross lesions were noted in the entire  examined stomach. Biopsies were taken with a cold                            forceps for histology and Helicobacter pylori                            testing.                           No gross lesions were noted in the duodenal bulb,                            in the first portion of the duodenum and in the                            second portion of the duodenum. Complications:            No immediate complications. Estimated Blood Loss:     Estimated blood loss was minimal. Impression:               - Nodule found in the esophagus. Biopsied.                           - Gastroesophageal junction polypoid lesion were                            found. Biopsied.                           - Normal mucosa was found in the entire esophagus                            otherwise. Biopsied for EoE.                           - Multiple gastric polyps - fundic gland appearing.                            Biopsied a few.                           - Erythematous mucosa in the antrum and prepyloric                            region of the stomach.                           - Non-bleeding gastric ulcer with a clean ulcer                            base (Forrest Class III).                           - Non-bleeding erosive gastropathy.                           -  No other gross lesions in the stomach. Biopsied                            for HP.                           - No gross lesions in the duodenal bulb, in the                            first portion of the duodenum and in the second                            portion of the duodenum. Recommendation:           - The patient will be observed  post-procedure,                            until all discharge criteria are met.                           - Discharge patient to home.                           - Patient has a contact number available for                            emergencies. The signs and symptoms of potential                            delayed complications were discussed with the                            patient. Return to normal activities tomorrow.                            Written discharge instructions were provided to the                            patient.                           - Resume previous diet.                           - Start Nexium 40 mg BID for next 1-2 months.                           - Await pathology results.                           - Repeat upper endoscopy in 3 months to check                            healing.                           -  Observe patient's clinical course.                           - The findings and recommendations were discussed                            with the patient.                           - The findings and recommendations were discussed                            with the patient's family. Justice Britain, MD 03/15/2018 1:27:56 PM

## 2018-03-15 NOTE — Progress Notes (Signed)
Called to room to assist during endoscopic procedure.  Patient ID and intended procedure confirmed with present staff. Received instructions for my participation in the procedure from the performing physician.  

## 2018-03-15 NOTE — Progress Notes (Signed)
A/ox3, pleased with MAC, report to RN 

## 2018-03-15 NOTE — Patient Instructions (Signed)
YOU HAD AN ENDOSCOPIC PROCEDURE TODAY AT THE [facility]:   Refer to the procedure report that was given to you for any specific questions about what was found during the examination.  If the procedure report does not answer your questions, please call your gastroenterologist to clarify.  If you requested that your care partner not be given the details of your procedure findings, then the procedure report has been included in a sealed envelope for you to review at your convenience later.  YOU SHOULD EXPECT: Some feelings of bloating in the abdomen. Passage of more gas than usual.  Walking can help get rid of the air that was put into your GI tract during the procedure and reduce the bloating. If you had a lower endoscopy (such as a colonoscopy or flexible sigmoidoscopy) you may notice spotting of blood in your stool or on the toilet paper. If you underwent a bowel prep for your procedure, you may not have a normal bowel movement for a few days.  Please Note:  You might notice some irritation and congestion in your nose or some drainage.  This is from the oxygen used during your procedure.  There is no need for concern and it should clear up in a day or so.  SYMPTOMS TO REPORT IMMEDIATELY:   Following upper endoscopy (EGD)  Vomiting of blood or coffee ground material  New chest pain or pain under the shoulder blades  Painful or persistently difficult swallowing  New shortness of breath  Fever of 100F or higher  Black, tarry-looking stools  For urgent or emergent issues, a gastroenterologist can be reached at any hour by calling [phone].   DIET:  We do recommend a small meal at first, but then you may proceed to your regular diet.  Drink plenty of fluids but you should avoid alcoholic beverages for 24 hours.  ACTIVITY:  You should plan to take it easy for the rest of today and you should NOT DRIVE or use heavy machinery until tomorrow (because of the sedation medicines used  during the test).    FOLLOW UP: Our staff will call the number listed on your records the next business day following your procedure to check on you and address any questions or concerns that you may have regarding the information given to you following your procedure. If we do not reach you, we will leave a message.  However, if you are feeling well and you are not experiencing any problems, there is no need to return our call.  We will assume that you have returned to your regular daily activities without incident.  If any biopsies were taken you will be contacted by phone or by letter within the next 1-3 weeks.  Please call us at [phone] if you have not heard about the biopsies in 3 weeks.    SIGNATURES/CONFIDENTIALITY: You and/or your care partner have signed paperwork which will be entered into your electronic medical record.  These signatures attest to the fact that that the information above on your After Visit Summary has been reviewed and is understood.  Full responsibility of the confidentiality of this discharge information lies with you and/or your care-partner. 

## 2018-03-15 NOTE — Progress Notes (Signed)
Patient needs to schedule an upper endoscopy in 3 months.  She did not want to schedule today, wants to wait for pathology from procedure.

## 2018-03-18 ENCOUNTER — Telehealth: Payer: Self-pay

## 2018-03-18 NOTE — Telephone Encounter (Signed)
  Follow up Call-  Call back number 03/15/2018  Post procedure Call Back phone  # [phone]  Permission to leave phone message Yes  Some recent data might be hidden     Patient questions:  Do you have a fever, pain , or abdominal swelling? No. Pain Score  0 *  Have you tolerated food without any problems? Yes.    Have you been able to return to your normal activities? Yes.    Do you have any questions about your discharge instructions: Diet   No. Medications  No. Follow up visit  No.  Do you have questions or concerns about your Care? No.  Actions: * If pain score is 4 or above: No action needed, pain <4.  Pt said she felt a mild discomfort in her chest when she swallowed.  But no other problems.  She is heading out to play pickel ball this am.  She will call us back if the discomfort does not resolve. maw

## 2018-03-20 ENCOUNTER — Ambulatory Visit: Payer: Medicare Other | Admitting: Physical Therapy

## 2018-03-20 ENCOUNTER — Encounter: Payer: Self-pay | Admitting: Physical Therapy

## 2018-03-20 DIAGNOSIS — M62838 Other muscle spasm: Secondary | ICD-10-CM

## 2018-03-20 DIAGNOSIS — M542 Cervicalgia: Secondary | ICD-10-CM

## 2018-03-20 NOTE — Therapy (Signed)
[facility] [address] [place], [place], [address] Phone: [phone]   Fax:  [phone]  Physical Therapy Treatment  Patient Details  Name: Tina Lucas MRN: [id number] Date of Birth: 10/09/1950 Referring Provider (PT): Binnie Rail, MD   Encounter Date: 03/20/2018  PT End of Session - 03/20/18 1358    Visit Number  22    Number of Visits  30    Date for PT Re-Evaluation  04/04/18    PT Start Time  3837    PT Stop Time  1316    PT Time Calculation (min)  45 min    Activity Tolerance  Patient tolerated treatment well    Behavior During Therapy  [facility] for tasks assessed/performed       Past Medical History:  Diagnosis Date  . Allergic rhinitis   . Allergy   . Asthma    as a child  . 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 History of asthma 1956-2000  . Hypothyroid 2013  . Osteopenia   . Primary HSV infection of mouth 2008  . 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 COLONOSCOPY    . polyp on vocal chord     . TONSILLECTOMY  1958    There were no vitals filed for this visit.  Subjective Assessment - 03/20/18 1237    Subjective  I have had more chest pain since getting the endoscopy.  Pt states feeling okay in the back of the head    Patient Stated Goals  reduce pain and dizziness to return to pickle ball    Currently in Pain?  No/denies                       [facility] Adult PT Treatment/Exercise - 03/20/18 0001      Manual Therapy   Soft tissue mobilization  suboccipitals, cervial paraspinals, Rt pecs    Myofascial Release  thoracic inlet, cranial fascial release, diaphragm, bil ribcage and stretch with breathing               PT Short Term Goals - 01/31/18 1401      PT SHORT TERM GOAL #1   Title  pt will be able to make it through  2 weeks of normal activity with at most one episode of pain in the back of her head    Baseline  no episodes since last visit    Status  Achieved        PT Long Term Goals - 02/22/18 1244      PT LONG TERM GOAL #1   Title  pt will be ind with advanced HEP    Status  On-going      PT LONG TERM GOAL #4   Title  pt demonstrates improved cervical rotation to 65 deg bilaterally for improved ability to navigate traffic while driving    Baseline  60 deg right; 65 left    Status  Partially Met      PT LONG TERM GOAL #7   Title  Pt will report no heaviness or headache feeling in the back of the head for >1 day/month    Baseline  has 2-3x/month but needed release of tissue tension 1x over the last several weeks    Status  On-going  Plan - 03/20/18 1354    Clinical Impression Statement  Pt felt less pain after treatment today.  She has been reporting she has been consistent with being able to go to pickle ball.  If patient continues to be able to maintain consistency at next visit, she will mostly likely be ready to discharge.    PT Treatment/Interventions  ADLs/Self Care Home Management;Biofeedback;Cryotherapy;Electrical Stimulation;Iontophoresis 62m/ml Dexamethasone;Moist Heat;Ultrasound;Therapeutic activities;Therapeutic exercise;Balance training;Neuromuscular re-education;Patient/family education;Manual techniques;Taping;Dry needling;Passive range of motion    PT Next Visit Plan  re-assess next visit most likely discharge    Consulted and Agree with Plan of Care  Patient       Patient will benefit from skilled therapeutic intervention in order to improve the following deficits and impairments:  Pain, Dizziness, Increased fascial restricitons, Decreased strength, Decreased range of motion, Postural dysfunction, Increased muscle spasms  Visit Diagnosis: Other muscle spasm  Cervicalgia     Problem List Patient Active Problem List   Diagnosis Date Noted  . History of  bronchitis 03/12/2018  . Atypical chest pain 02/26/2018  . Asthma 01/31/2018  . Mild intermittent asthma with exacerbation 01/31/2018  . Chronic neck pain 08/27/2017  . Hyperglycemia 03/06/2017  . GERD (gastroesophageal reflux disease) 04/20/2016  . Numbness 08/06/2015  . Osteopenia 12/10/2014  . Allergic urticaria 12/10/2014  . Pressure in head 04/01/2014  . Dizziness 02/10/2014  . Elevated blood pressure reading 11/06/2013  . Hypothyroid   . Primary HSV infection of mouth     JZannie Cove PT 03/20/2018, 2:00 PM  [facility] [address] [place] [address]Phone: [phone]  Fax:  [phone] Name: Tina LukesMRN: [id number] of Birth: 602/11/1950

## 2018-03-21 ENCOUNTER — Encounter: Payer: Self-pay | Admitting: Gastroenterology

## 2018-03-21 ENCOUNTER — Encounter: Payer: Self-pay | Admitting: Internal Medicine

## 2018-03-26 ENCOUNTER — Encounter: Payer: Self-pay | Admitting: Internal Medicine

## 2018-03-28 NOTE — Progress Notes (Signed)
Subjective:    Patient ID: Tina Lucas, female    DOB: 1950/11/17, 68 y.o.   MRN: [id number]  HPI She is here for a physical exam.   She had an EGD since she was here last.  She was found to have a nodule in her esophagus, polypoid lesion at GE junction, normal esophagus, multiple gastric polyps, erythematous mucosa in the antrum and prepyloric region of the stomach, nonbleeding gastric ulcer, nonbleeding erosive gastropathy.  She was started on Nexium 40 mg twice daily for the next 1-2 months.  She is taking this as prescribed.  GI wants to repeat an upper endoscopy in 3 months.    Medications and allergies reviewed with patient and updated if appropriate.  Patient Active Problem List   Diagnosis Date Noted  . History of bronchitis 03/12/2018  . Atypical chest pain 02/26/2018  . Asthma 01/31/2018  . Chronic neck pain 08/27/2017  . GERD (gastroesophageal reflux disease) 04/20/2016  . Numbness 08/06/2015  . Osteopenia 12/10/2014  . Allergic urticaria 12/10/2014  . Pressure in head 04/01/2014  . Dizziness 02/10/2014  . Elevated blood pressure reading 11/06/2013  . Hypothyroid   . Primary HSV infection of mouth     Current Outpatient Medications on File Prior to Visit  Medication Sig Dispense Refill  . Calcium Carbonate-Vitamin D (CALTRATE 600+D) 600-400 MG-UNIT per tablet Take 1 tablet by mouth daily.    . cholecalciferol (VITAMIN D) 400 UNITS TABS tablet Take 400 Units by mouth.    . esomeprazole (NEXIUM) 40 MG capsule Take 1 capsule (40 mg total) by mouth 2 (two) times daily. 60 capsule 2  . fexofenadine (ALLEGRA) 180 MG tablet Take 180 mg by mouth daily.    . fluocinonide cream (LIDEX) 3.08 % Apply 1 application topically 2 (two) times daily as needed (FOR PSORIASIS).     Marland Kitchen levothyroxine (SYNTHROID, LEVOTHROID) 50 MCG tablet TAKE ONE TABLET BY MOUTH EVERY MORNING BEFORE BREAKFAST 90 tablet 1  . Magnesium 250 MG TABS Take 125 mg by mouth daily.    . Multiple  Vitamins-Minerals (ONE-A-DAY WOMENS 50+ ADVANTAGE PO) Take by mouth daily.    . prednisoLONE acetate (PRED FORTE) 1 % ophthalmic suspension Place 1 drop into the right eye daily. Pt takes 3 to 4 times per week    . Probiotic Product (PROBIOTIC DAILY) CAPS Take 1 capsule by mouth daily. Pt is taking 3 to 4 times a week     No current facility-administered medications on file prior to visit.     Past Medical History:  Diagnosis Date  . Allergic rhinitis   . Allergy   . Asthma    as a child  . 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 History of asthma 1956-2000  . Hypothyroid 2013  . Osteopenia   . Primary HSV infection of mouth 2008  . 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 COLONOSCOPY    . polyp on vocal chord     . TONSILLECTOMY  1958    Social History   Socioeconomic History  . Marital status: Married    Spouse name: Not on file  . Number of children: Not on file  . Years of education: Not on file  . Highest education level: Not on file  Occupational History  . Not on file  Social Needs  . Financial resource strain: Not on file  .  Food insecurity:    Worry: Not on file    Inability: Not on file  . Transportation needs:    Medical: Not on file    Non-medical: Not on file  Tobacco Use  . Smoking status: Never Smoker  . Smokeless tobacco: Never Used  Substance and Sexual Activity  . Alcohol use: Yes    Alcohol/week: 0.0 standard drinks    Comment: socially  . Drug use: No  . Sexual activity: Not on file  Lifestyle  . Physical activity:    Days per week: Not on file    Minutes per session: Not on file  . Stress: Not on file  Relationships  . Social connections:    Talks on phone: Not on file    Gets together: Not on file    Attends religious service: Not on file    Active member of club or organization: Not on file     Attends meetings of clubs or organizations: Not on file    Relationship status: Not on file  Other Topics Concern  . Not on file  Social History Narrative   Lives with husband in a one story home.  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    Family History  Problem Relation Age of Onset  . Breast cancer Mother 55  . Osteoarthritis Father   . Hypertension Father   . Leukemia Maternal Aunt 76  . Atrial fibrillation Brother   . Colon cancer Neg Hx   . Esophageal cancer Neg Hx   . Inflammatory bowel disease Neg Hx   . Liver disease Neg Hx   . Pancreatic cancer Neg Hx   . Rectal cancer Neg Hx   . Stomach cancer Neg Hx     Review of Systems  Constitutional: Negative for chills and fever.  Eyes: Negative for visual disturbance.  Respiratory: Negative for cough, shortness of breath and wheezing.   Cardiovascular: Negative for chest pain, palpitations and leg swelling.  Gastrointestinal: Negative for abdominal pain, blood in stool, constipation, diarrhea and nausea.  Genitourinary: Negative for dysuria and hematuria.  Musculoskeletal: Positive for neck pain. Negative for arthralgias and back pain.  Skin: Positive for color change.  Neurological: Positive for dizziness (intermittent). Negative for light-headedness and headaches.  Psychiatric/Behavioral: Negative for decreased concentration. The patient is not nervous/anxious.        Objective:   Vitals:   03/29/18 0800  BP: 134/82  Pulse: 65  Resp: 16  Temp: 98.3 F (36.8 C)  SpO2: 96%   Filed Weights   03/29/18 0800  Weight: 125 lb (56.7 kg)   Body mass index is 19.01 kg/m.  BP Readings from Last 3 Encounters:  03/29/18 134/82  03/15/18 124/76  03/12/18 126/78    Wt Readings from Last 3 Encounters:  03/29/18 125 lb (56.7 kg)  03/15/18 127 lb (57.6 kg)  03/12/18 127 lb (57.6 kg)     Physical Exam Constitutional: She appears well-developed and well-nourished. No distress.  HENT:  Head:  Normocephalic and atraumatic.  Right Ear: External ear normal. Normal ear canal and TM Left Ear: External ear normal.  Normal ear canal and TM Mouth/Throat: Oropharynx is clear and moist.  Eyes: Conjunctivae and EOM are normal.  Neck: Neck supple. No tracheal deviation present. No thyromegaly present.  No carotid bruit  Cardiovascular: Normal rate, regular rhythm and normal heart sounds.   No murmur heard.  No edema. Pulmonary/Chest: Effort normal and breath sounds normal. No respiratory distress. She  has no wheezes. She has no rales.  Breast: deferred  Abdominal: Soft. She exhibits no distension. There is no tenderness.  Lymphadenopathy: She has no cervical adenopathy.  Skin: Skin is warm and dry. She is not diaphoretic.  Psychiatric: She has a normal mood and affect. Her behavior is normal.        Assessment & Plan:   Physical exam: Screening blood work  ordered Immunizations deferred shingles vaccine, discussed tetanus, Pneumovax Colonoscopy   Cologuard due-last done 2016 Mammogram    up-to-date Dexa   deferred Eye exams   up-to-date EKG   03/2016 Exercise   regular Weight   normal BMI Skin    Skin changes - has derm appt, follows with dermatology  Substance abuse     none  See Problem List for Assessment and Plan of chronic medical problems.   FU in one year

## 2018-03-28 NOTE — Patient Instructions (Addendum)
Tests ordered today. Your results will be released to [place] (or called to you) after review, usually within 72hours after test completion. If any changes need to be made, you will be notified at that same time.  All other Health Maintenance issues reviewed.   All recommended immunizations and age-appropriate screenings are up-to-date or discussed.  No immunizations administered today.   Medications reviewed and updated.  Changes include :   none  Your prescription(s) have been submitted to your pharmacy. Please take as directed and contact our office if you believe you are having problem(s) with the medication(s).  A bone density was ordered   Please followup in one year   Health Maintenance, Female Adopting a healthy lifestyle and getting preventive care can go a long way to promote health and wellness. Talk with your health care provider about what schedule of regular examinations is right for you. This is a good chance for you to check in with your provider about disease prevention and staying healthy. In between checkups, there are plenty of things you can do on your own. Experts have done a lot of research about which lifestyle changes and preventive measures are most likely to keep you healthy. Ask your health care provider for more information. Weight and diet Eat a healthy diet  Be sure to include plenty of vegetables, fruits, low-fat dairy products, and lean protein.  Do not eat a lot of foods high in solid fats, added sugars, or salt.  Get regular exercise. This is one of the most important things you can do for your health. ? Most adults should exercise for at least 150 minutes each week. The exercise should increase your heart rate and make you sweat (moderate-intensity exercise). ? Most adults should also do strengthening exercises at least twice a week. This is in addition to the moderate-intensity exercise. Maintain a healthy weight  Body mass index (BMI) is a  measurement that can be used to identify possible weight problems. It estimates body fat based on height and weight. Your health care provider can help determine your BMI and help you achieve or maintain a healthy weight.  For females 85 years of age and older: ? A BMI below 18.5 is considered underweight. ? A BMI of 18.5 to 24.9 is normal. ? A BMI of 25 to 29.9 is considered overweight. ? A BMI of 30 and above is considered obese. Watch levels of cholesterol and blood lipids  You should start having your blood tested for lipids and cholesterol at 69 years of age, then have this test every 5 years.  You may need to have your cholesterol levels checked more often if: ? Your lipid or cholesterol levels are high. ? You are older than 69 years of age. ? You are at high risk for heart disease. Cancer screening Lung Cancer  Lung cancer screening is recommended for adults 53-56 years old who are at high risk for lung cancer because of a history of smoking.  A yearly low-dose CT scan of the lungs is recommended for people who: ? Currently smoke. ? Have quit within the past 15 years. ? Have at least a 30-pack-year history of smoking. A pack year is smoking an average of one pack of cigarettes a day for 1 year.  Yearly screening should continue until it has been 15 years since you quit.  Yearly screening should stop if you develop a health problem that would prevent you from having lung cancer treatment. Breast Cancer  Practice  breast self-awareness. This means understanding how your breasts normally appear and feel.  It also means doing regular breast self-exams. Let your health care provider know about any changes, no matter how small.  If you are in your 20s or 30s, you should have a clinical breast exam (CBE) by a health care provider every 1-3 years as part of a regular health exam.  If you are 41 or older, have a CBE every year. Also consider having a breast X-ray (mammogram) every  year.  If you have a family history of breast cancer, talk to your health care provider about genetic screening.  If you are at high risk for breast cancer, talk to your health care provider about having an MRI and a mammogram every year.  Breast cancer gene (BRCA) assessment is recommended for women who have family members with BRCA-related cancers. BRCA-related cancers include: ? Breast. ? Ovarian. ? Tubal. ? Peritoneal cancers.  Results of the assessment will determine the need for genetic counseling and BRCA1 and BRCA2 testing. Cervical Cancer Your health care provider may recommend that you be screened regularly for cancer of the pelvic organs (ovaries, uterus, and vagina). This screening involves a pelvic examination, including checking for microscopic changes to the surface of your cervix (Pap test). You may be encouraged to have this screening done every 3 years, beginning at age 84.  For women ages 84-65, health care providers may recommend pelvic exams and Pap testing every 3 years, or they may recommend the Pap and pelvic exam, combined with testing for human papilloma virus (HPV), every 5 years. Some types of HPV increase your risk of cervical cancer. Testing for HPV may also be done on women of any age with unclear Pap test results.  Other health care providers may not recommend any screening for nonpregnant women who are considered low risk for pelvic cancer and who do not have symptoms. Ask your health care provider if a screening pelvic exam is right for you.  If you have had past treatment for cervical cancer or a condition that could lead to cancer, you need Pap tests and screening for cancer for at least 20 years after your treatment. If Pap tests have been discontinued, your risk factors (such as having a new sexual partner) need to be reassessed to determine if screening should resume. Some women have medical problems that increase the chance of getting cervical cancer. In  these cases, your health care provider may recommend more frequent screening and Pap tests. Colorectal Cancer  This type of cancer can be detected and often prevented.  Routine colorectal cancer screening usually begins at 69 years of age and continues through 69 years of age.  Your health care provider may recommend screening at an earlier age if you have risk factors for colon cancer.  Your health care provider may also recommend using home test kits to check for hidden blood in the stool.  A small camera at the end of a tube can be used to examine your colon directly (sigmoidoscopy or colonoscopy). This is done to check for the earliest forms of colorectal cancer.  Routine screening usually begins at age 71.  Direct examination of the colon should be repeated every 5-10 years through 69 years of age. However, you may need to be screened more often if early forms of precancerous polyps or small growths are found. Skin Cancer  Check your skin from head to toe regularly.  Tell your health care provider about any new  moles or changes in moles, especially if there is a change in a mole's shape or color.  Also tell your health care provider if you have a mole that is larger than the size of a pencil eraser.  Always use sunscreen. Apply sunscreen liberally and repeatedly throughout the day.  Protect yourself by wearing long sleeves, pants, a wide-brimmed hat, and sunglasses whenever you are outside. Heart disease, diabetes, and high blood pressure  High blood pressure causes heart disease and increases the risk of stroke. High blood pressure is more likely to develop in: ? People who have blood pressure in the high end of the normal range (130-139/85-89 mm Hg). ? People who are overweight or obese. ? People who are African American.  If you are 25-16 years of age, have your blood pressure checked every 3-5 years. If you are 66 years of age or older, have your blood pressure checked  every year. You should have your blood pressure measured twice-once when you are at a hospital or clinic, and once when you are not at a hospital or clinic. Record the average of the two measurements. To check your blood pressure when you are not at a hospital or clinic, you can use: ? An automated blood pressure machine at a pharmacy. ? A home blood pressure monitor.  If you are between 63 years and 10 years old, ask your health care provider if you should take aspirin to prevent strokes.  Have regular diabetes screenings. This involves taking a blood sample to check your fasting blood sugar level. ? If you are at a normal weight and have a low risk for diabetes, have this test once every three years after 69 years of age. ? If you are overweight and have a high risk for diabetes, consider being tested at a younger age or more often. Preventing infection Hepatitis B  If you have a higher risk for hepatitis B, you should be screened for this virus. You are considered at high risk for hepatitis B if: ? You were born in a country where hepatitis B is common. Ask your health care provider which countries are considered high risk. ? Your parents were born in a high-risk country, and you have not been immunized against hepatitis B (hepatitis B vaccine). ? You have HIV or AIDS. ? You use needles to inject street drugs. ? You live with someone who has hepatitis B. ? You have had sex with someone who has hepatitis B. ? You get hemodialysis treatment. ? You take certain medicines for conditions, including cancer, organ transplantation, and autoimmune conditions. Hepatitis C  Blood testing is recommended for: ? Everyone born from [birth year] through 1966. ? Anyone with known risk factors for hepatitis C. Sexually transmitted infections (STIs)  You should be screened for sexually transmitted infections (STIs) including gonorrhea and chlamydia if: ? You are sexually active and are younger than 69 years of  age. ? You are older than 69 years of age and your health care provider tells you that you are at risk for this type of infection. ? Your sexual activity has changed since you were last screened and you are at an increased risk for chlamydia or gonorrhea. Ask your health care provider if you are at risk.  If you do not have HIV, but are at risk, it may be recommended that you take a prescription medicine daily to prevent HIV infection. This is called pre-exposure prophylaxis (PrEP). You are considered at risk if: ? You  are sexually active and do not regularly use condoms or know the HIV status of your partner(s). ? You take drugs by injection. ? You are sexually active with a partner who has HIV. Talk with your health care provider about whether you are at high risk of being infected with HIV. If you choose to begin PrEP, you should first be tested for HIV. You should then be tested every 3 months for as long as you are taking PrEP. Pregnancy  If you are premenopausal and you may become pregnant, ask your health care provider about preconception counseling.  If you may become pregnant, take 400 to 800 micrograms (mcg) of folic acid every day.  If you want to prevent pregnancy, talk to your health care provider about birth control (contraception). Osteoporosis and menopause  Osteoporosis is a disease in which the bones lose minerals and strength with aging. This can result in serious bone fractures. Your risk for osteoporosis can be identified using a bone density scan.  If you are 72 years of age or older, or if you are at risk for osteoporosis and fractures, ask your health care provider if you should be screened.  Ask your health care provider whether you should take a calcium or vitamin D supplement to lower your risk for osteoporosis.  Menopause may have certain physical symptoms and risks.  Hormone replacement therapy may reduce some of these symptoms and risks. Talk to your health  care provider about whether hormone replacement therapy is right for you. Follow these instructions at home:  Schedule regular health, dental, and eye exams.  Stay current with your immunizations.  Do not use any tobacco products including cigarettes, chewing tobacco, or electronic cigarettes.  If you are pregnant, do not drink alcohol.  If you are breastfeeding, limit how much and how often you drink alcohol.  Limit alcohol intake to no more than 1 drink per day for nonpregnant women. One drink equals 12 ounces of beer, 5 ounces of wine, or 1 ounces of hard liquor.  Do not use street drugs.  Do not share needles.  Ask your health care provider for help if you need support or information about quitting drugs.  Tell your health care provider if you often feel depressed.  Tell your health care provider if you have ever been abused or do not feel safe at home. This information is not intended to replace advice given to you by your health care provider. Make sure you discuss any questions you have with your health care provider. Document Released: 07/25/2010 Document Revised: 06/17/2015 Document Reviewed: 10/13/2014 Elsevier Interactive Patient Education  2019 [employer].

## 2018-03-29 ENCOUNTER — Ambulatory Visit (INDEPENDENT_AMBULATORY_CARE_PROVIDER_SITE_OTHER): Payer: Medicare Other | Admitting: Internal Medicine

## 2018-03-29 ENCOUNTER — Ambulatory Visit (INDEPENDENT_AMBULATORY_CARE_PROVIDER_SITE_OTHER)
Admission: RE | Admit: 2018-03-29 | Discharge: 2018-03-29 | Disposition: A | Discharge status: Home / Self Care | Payer: Medicare Other | Source: Ambulatory Visit | Attending: Internal Medicine | Admitting: Internal Medicine

## 2018-03-29 ENCOUNTER — Encounter: Payer: Self-pay | Admitting: Internal Medicine

## 2018-03-29 ENCOUNTER — Other Ambulatory Visit (INDEPENDENT_AMBULATORY_CARE_PROVIDER_SITE_OTHER): Payer: Medicare Other

## 2018-03-29 VITALS — BP 134/82 | HR 65 | Temp 98.3°F | Resp 16 | Ht 68.0 in | Wt 125.0 lb

## 2018-03-29 DIAGNOSIS — Z0000 Encounter for general adult medical examination without abnormal findings: Secondary | ICD-10-CM

## 2018-03-29 DIAGNOSIS — M85859 Other specified disorders of bone density and structure, unspecified thigh: Secondary | ICD-10-CM

## 2018-03-29 DIAGNOSIS — E039 Hypothyroidism, unspecified: Secondary | ICD-10-CM

## 2018-03-29 DIAGNOSIS — Z23 Encounter for immunization: Secondary | ICD-10-CM | POA: Diagnosis not present

## 2018-03-29 DIAGNOSIS — Z13820 Encounter for screening for osteoporosis: Secondary | ICD-10-CM | POA: Diagnosis not present

## 2018-03-29 DIAGNOSIS — R42 Dizziness and giddiness: Secondary | ICD-10-CM

## 2018-03-29 DIAGNOSIS — K219 Gastro-esophageal reflux disease without esophagitis: Secondary | ICD-10-CM

## 2018-03-29 DIAGNOSIS — J4520 Mild intermittent asthma, uncomplicated: Secondary | ICD-10-CM

## 2018-03-29 LAB — COMPREHENSIVE METABOLIC PANEL
ALT: 18 U/L (ref 0–35)
AST: 19 U/L (ref 0–37)
Albumin: 4.9 g/dL (ref 3.5–5.2)
Alkaline Phosphatase: 64 U/L (ref 39–117)
BUN: 11 mg/dL (ref 6–23)
CO2: 30 mEq/L (ref 19–32)
Calcium: 10 mg/dL (ref 8.4–10.5)
Chloride: 103 mEq/L (ref 96–112)
Creatinine, Ser: 0.7 mg/dL (ref 0.40–1.20)
GFR: 83.04 mL/min (ref >60.00)
Glucose, Bld: 93 mg/dL (ref 70–99)
Potassium: 4.2 mEq/L (ref 3.5–5.1)
Sodium: 142 mEq/L (ref 135–145)
Total Bilirubin: 0.9 mg/dL (ref 0.2–1.2)
Total Protein: 6.9 g/dL (ref 6.0–8.3)

## 2018-03-29 LAB — CBC WITH DIFFERENTIAL/PLATELET
Basophils Absolute: 0.1 10*3/uL (ref 0.0–0.1)
Basophils Relative: 1.3 % (ref 0.0–3.0)
Eosinophils Absolute: 0.5 10*3/uL (ref 0.0–0.7)
Eosinophils Relative: 10.6 % — ABNORMAL HIGH (ref 0.0–5.0)
HCT: 43.6 % (ref 36.0–46.0)
Hemoglobin: 15.2 g/dL — ABNORMAL HIGH (ref 12.0–15.0)
Lymphocytes Relative: 24.9 % (ref 12.0–46.0)
Lymphs Abs: 1.2 10*3/uL (ref 0.7–4.0)
MCHC: 34.7 g/dL (ref 30.0–36.0)
MCV: 90.4 fl (ref 78.0–100.0)
Monocytes Absolute: 0.5 10*3/uL (ref 0.1–1.0)
Monocytes Relative: 9.7 % (ref 3.0–12.0)
Neutro Abs: 2.6 10*3/uL (ref 1.4–7.7)
Neutrophils Relative %: 53.5 % (ref 43.0–77.0)
Platelets: 227 10*3/uL (ref 150.0–400.0)
RBC: 4.82 Mil/uL (ref 3.87–5.11)
RDW: 13 % (ref 11.5–15.5)
WBC: 4.8 10*3/uL (ref 4.0–10.5)

## 2018-03-29 LAB — LIPID PANEL
Cholesterol: 212 mg/dL — ABNORMAL HIGH (ref 0–200)
HDL: 84.4 mg/dL (ref >39.00)
LDL Cholesterol: 117 mg/dL — ABNORMAL HIGH (ref 0–99)
NonHDL: 127.97
Total CHOL/HDL Ratio: 3
Triglycerides: 54 mg/dL (ref 0.0–149.0)
VLDL: 10.8 mg/dL (ref 0.0–40.0)

## 2018-03-29 LAB — TSH: TSH: 2.57 u[IU]/mL (ref 0.35–4.50)

## 2018-03-29 MED ORDER — CELECOXIB 100 MG PO CAPS: 100.0000 mg | ORAL_CAPSULE | Freq: Two times a day (BID) | ORAL | 12 refills | Status: DC

## 2018-03-29 NOTE — Assessment & Plan Note (Signed)
Mild, intermittent Has used Breo with URI's only No symptoms outside of URI's

## 2018-03-29 NOTE — Assessment & Plan Note (Signed)
Clinically euthyroid Check tsh  Titrate med dose if needed  

## 2018-03-29 NOTE — Assessment & Plan Note (Signed)
Following with GI Taking nexium BID x 2 months

## 2018-03-29 NOTE — Assessment & Plan Note (Signed)
H/o osteopenia Taking calcium and vitamin d Exercises regularly dexa ordered

## 2018-03-29 NOTE — Addendum Note (Signed)
Addended by: Delice Bison E on: 03/29/2018 02:15 PM   Modules accepted: Orders

## 2018-03-29 NOTE — Assessment & Plan Note (Signed)
Chronic, intermittent Combination of bppv, cervical vertigo ( arthirtis in spine) Completely PT Will need PT prn

## 2018-03-30 ENCOUNTER — Encounter: Payer: Self-pay | Admitting: Internal Medicine

## 2018-04-03 ENCOUNTER — Ambulatory Visit: Payer: Medicare Other | Attending: Internal Medicine | Admitting: Physical Therapy

## 2018-04-03 ENCOUNTER — Encounter: Payer: Self-pay | Admitting: Physical Therapy

## 2018-04-03 ENCOUNTER — Other Ambulatory Visit: Payer: Self-pay

## 2018-04-03 DIAGNOSIS — M542 Cervicalgia: Secondary | ICD-10-CM | POA: Insufficient documentation

## 2018-04-03 DIAGNOSIS — M62838 Other muscle spasm: Secondary | ICD-10-CM | POA: Diagnosis present

## 2018-04-03 NOTE — Therapy (Addendum)
[facility] [address], [place], [place], [address] Phone: [phone]   Fax:  [phone]  Physical Therapy Treatment  Patient Details  Name: Tina Lucas MRN: [id number] Date of Birth: Sep 30, 1950 Referring Provider (PT): Binnie Rail, MD   Encounter Date: 04/03/2018  PT End of Session - 04/03/18 1236    Visit Number  23    Number of Visits  30    Date for PT Re-Evaluation  04/04/18    Authorization Type  [employer] medicare    PT Start Time  2355    PT Stop Time  1313    PT Time Calculation (min)  42 min    Activity Tolerance  Patient tolerated treatment well    Behavior During Therapy  [facility] for tasks assessed/performed       Past Medical History:  Diagnosis Date  . Allergic rhinitis   . Allergy   . Asthma    as a child  . 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 History of asthma 1956-2000  . Hypothyroid 2013  . Osteopenia   . Primary HSV infection of mouth 2008  . 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 COLONOSCOPY    . polyp on vocal chord     . TONSILLECTOMY  1958    There were no vitals filed for this visit.  Subjective Assessment - 04/03/18 1237    Subjective  I have had just a little back of the head feeling like the vertigo is coming, but hasn't gotten worse.    Pertinent History  chronic vertigo, BPPV    Patient Stated Goals  reduce pain and dizziness to return to pickle ball    Currently in Pain?  No/denies                       [facility] Adult PT Treatment/Exercise - 04/03/18 0001      Manual Therapy   Soft tissue mobilization  suboccipitals, cervial paraspinals, Rt pecs    Myofascial Release  thoracic inlet, cranial fascial release, diaphragm, bil ribcage and stretch with breathing             PT Education - 04/03/18 1344     Education provided  Yes    Education Details  info on pillows to get for lying supine/semireclined    Person(s) Educated  Patient    Methods  Explanation;Handout    Comprehension  Verbalized understanding       PT Short Term Goals - 01/31/18 1401      PT SHORT TERM GOAL #1   Title  pt will be able to make it through 2 weeks of normal activity with at most one episode of pain in the back of her head    Baseline  no episodes since last visit    Status  Achieved        PT Long Term Goals - 04/03/18 1347      PT LONG TERM GOAL #1   Title  pt will be ind with advanced HEP    Status  Achieved      PT LONG TERM GOAL #2   Title  Pt will report 50% less dizziness during pickle ball games    Status  Achieved      PT LONG TERM GOAL #3   Title  FOTO < or =  to 36% limited    Status  Achieved      PT LONG TERM GOAL #4   Title  pt demonstrates improved cervical rotation to 65 deg bilaterally for improved ability to navigate traffic while driving    Status  Partially Met      PT LONG TERM GOAL #5   Title  Pt will report 50% less muscle spasms in her neck so she can tolerate playing 3 or more games of pickle ball to return to her normal activities.    Status  Achieved      PT LONG TERM GOAL #6   Title  Pt will be able to play 2 consecutive games of pickleball without increased pain in her thumb    Status  Achieved      PT LONG TERM GOAL #7   Title  Pt will report no heaviness or headache feeling in the back of the head for >1 day/month    Status  Achieved            Plan - 04/03/18 1240    Clinical Impression Statement  Pt has been able to return to pickle ball and has been consistently not having the extreme dizziness that was limiting her activities.  She will discharge with HEP    PT Treatment/Interventions  ADLs/Self Care Home Management;Biofeedback;Cryotherapy;Electrical Stimulation;Iontophoresis 3m/ml Dexamethasone;Moist Heat;Ultrasound;Therapeutic  activities;Therapeutic exercise;Balance training;Neuromuscular re-education;Patient/family education;Manual techniques;Taping;Dry needling;Passive range of motion    PT Next Visit Plan  discharged today    PT Home Exercise Plan  [id number]    Consulted and Agree with Plan of Care  Patient       Patient will benefit from skilled therapeutic intervention in order to improve the following deficits and impairments:  Pain, Dizziness, Increased fascial restricitons, Decreased strength, Decreased range of motion, Postural dysfunction, Increased muscle spasms  Visit Diagnosis: Other muscle spasm  Cervicalgia     Problem List Patient Active Problem List   Diagnosis Date Noted  . History of bronchitis 03/12/2018  . Atypical chest pain 02/26/2018  . Asthma 01/31/2018  . Chronic neck pain 08/27/2017  . GERD (gastroesophageal reflux disease) 04/20/2016  . Numbness 08/06/2015  . Osteopenia 12/10/2014  . Allergic urticaria 12/10/2014  . Pressure in head 04/01/2014  . Dizziness 02/10/2014  . Elevated blood pressure reading 11/06/2013  . Hypothyroid   . Primary HSV infection of mouth     JJule Ser PT 04/03/2018, 1:50 PM  [facility] [address] [place] [id number]: [phone]  Fax:  [phone] Name: GHarlei LehrmannMRN: [id number] of Birth: 6Jul 18, 1952 PHYSICAL THERAPY DISCHARGE SUMMARY  Visits from Start of Care: 23  Current functional level related to goals / functional outcomes: See above details   Remaining deficits: See above details   Education / Equipment: HEP  Plan: Patient agrees to discharge.  Patient goals were partially met. Patient is being discharged due to being pleased with the current functional level.  ?????     [employer] PT 04/03/18 5:17 PM

## 2018-04-03 NOTE — Patient Instructions (Signed)
  [email or web address]   Look for Wedge pillows and bolsters for legs

## 2018-04-06 LAB — COLOGUARD: Cologuard: POSITIVE — AB

## 2018-04-09 ENCOUNTER — Other Ambulatory Visit: Payer: Self-pay | Admitting: Internal Medicine

## 2018-04-10 ENCOUNTER — Telehealth: Payer: Self-pay | Admitting: Internal Medicine

## 2018-04-10 NOTE — Telephone Encounter (Signed)
Please call her and let her know her cologuard is positive.  There are sometimes false positives, but she should consider having a colonoscopy.  This is not urgent and can wait until everything settles down, but she should see GI for follow up regarding this.

## 2018-04-11 NOTE — Telephone Encounter (Signed)
Pt aware of results below and will follow up with GI on the 31st when she goes back.

## 2018-04-12 ENCOUNTER — Encounter: Payer: Self-pay | Admitting: Internal Medicine

## 2018-04-23 ENCOUNTER — Other Ambulatory Visit: Payer: Self-pay

## 2018-04-23 ENCOUNTER — Ambulatory Visit: Payer: Medicare Other | Admitting: Gastroenterology

## 2018-04-23 DIAGNOSIS — R198 Other specified symptoms and signs involving the digestive system and abdomen: Secondary | ICD-10-CM

## 2018-04-23 DIAGNOSIS — K259 Gastric ulcer, unspecified as acute or chronic, without hemorrhage or perforation: Secondary | ICD-10-CM

## 2018-04-23 DIAGNOSIS — R12 Heartburn: Secondary | ICD-10-CM

## 2018-04-23 DIAGNOSIS — K219 Gastro-esophageal reflux disease without esophagitis: Secondary | ICD-10-CM

## 2018-04-23 DIAGNOSIS — R0789 Other chest pain: Secondary | ICD-10-CM

## 2018-04-23 DIAGNOSIS — R195 Other fecal abnormalities: Secondary | ICD-10-CM

## 2018-04-23 NOTE — Progress Notes (Signed)
[place] VISIT   Primary Care Provider Binnie Rail, MD [address] [place] [id number] [phone]  Patient Profile: Tina Lucas is a 68 y.o. female with a pmh significant for hypothyroidism, allergic rhinitis, arthralgias, history of gastric ulcer found on recent endoscopy, some microscopic acid reflux changes based on pathology at time of EGD.  The patient presents to the [facility] for an evaluation and management of problem(s) noted below:  Problem List 1. Gastroesophageal reflux disease, esophagitis presence not specified   2. Positive colorectal cancer screening using Cologuard test   3. Gastric ulcer without hemorrhage or perforation, unspecified chronicity   4. Pyrosis   5. Atypical chest pain   6. Abnormal findings on esophagogastroduodenoscopy (EGD)     History of Present Illness: Please see initial consultation note for full details of HPI.  Due to the COVID-19 Pandemic, this service was provided via telemedicine using attempt at WebEx/Facetime/Zoom. Interactive audio and video telecommunications were attempted between this provider and patient, however failed, due to patient not having access to video capability and thus to provide timely and excellent care, we continued and completed visit with audio only. The patient was located at home. The provider was located in the office. The patient did consent to this visit and is aware of charges through their insurance. Other persons participating in this telemedicine service were patient's husband who was with her over the phone. Time spent on visit was 25 minutes.  Interval History Today is a scheduled follow-up for patient post endoscopy.  The patient underwent endoscopic evaluation in February with results as below but in brief showed evidence of some gastric ulceration as well as some findings that were concerning for possible squamous papilloma status post biopsy   without evidence of that but did show evidence of microscopic acid reflux changes.  There was no eosinophilic esophagitis or lymphocytic esophagitis.  She did not have H. pylori infection.  The patient has been maintained on twice daily dosing of Nexium since that point in time.  Patient is now approximately 6 weeks into the use of Nexium.  The patient is still experiencing issues of her atypical chest discomfort/burning sensation that was the initial reasoning for her work-up.  She is not felt that has made any significant changes with being on the high-dose medication.  Patient is also experiencing increased amounts of eructation/burping and thinks that this could be a medication associated side effect.  She is still going outside but is not able to play pickle ball.  She is only walking and halfway into her walk she begins to experience her chest burning once again.  Her diet has not grossly changed and she cannot pinpoint any type of food that may be causing her issues.  Some days of burning in her chest are worse than others.  The patient describes no dysphagia or odynophagia.  She describes no changes in her bowel habits and has no melena or hematochezia.  She underwent Cologuard testing with her primary care doctor and this returned positive.  She previously had a 2017 Cologuard that was negative.  She is concerned about this.  She remains concerned whether the Celebrex that she is taking once daily could be causing her issues though she has been on this for years.  She describes that if she were to stop this for more than a few days she would have significant arthritis/arthralgias that would not allow her to be functional.  She is weary of  coming off of that even for a few days.  GI Review of Systems Positive as above Negative for nocturnal cough, postprandial cough, abdominal pain, nausea, vomiting   Review of Systems General: Denies fevers/chills/weight loss HEENT: Denies oral  lesions Cardiovascular: Denies exertional chest pain Pulmonary: Denies shortness of breath Gastroenterological: See HPI Genitourinary: Denies darkened urine Hematological: Denies easy bruising Dermatological: Denies jaundice Psychological: Mood remains anxious and even more so because of the positive Cologuard   Medications Current Outpatient Medications  Medication Sig Dispense Refill   Calcium Carbonate-Vitamin D (CALTRATE 600+D) 600-400 MG-UNIT per tablet Take 1 tablet by mouth daily.     celecoxib (CELEBREX) 100 MG capsule Take 1 capsule (100 mg total) by mouth 2 (two) times daily. 60 capsule 12   cholecalciferol (VITAMIN D) 400 UNITS TABS tablet Take 400 Units by mouth.     esomeprazole (NEXIUM) 40 MG capsule Take 1 capsule (40 mg total) by mouth 2 (two) times daily. 60 capsule 2   fexofenadine (ALLEGRA) 180 MG tablet Take 180 mg by mouth daily.     fluocinonide cream (LIDEX) 5.83 % Apply 1 application topically 2 (two) times daily as needed (FOR PSORIASIS).      levothyroxine (SYNTHROID, LEVOTHROID) 50 MCG tablet TAKE ONE TABLET BY MOUTH EVERY MORNING BEFORE BREAKFAST 90 tablet 1   Magnesium 250 MG TABS Take 125 mg by mouth daily.     Multiple Vitamins-Minerals (ONE-A-DAY WOMENS 50+ ADVANTAGE PO) Take by mouth daily.     prednisoLONE acetate (PRED FORTE) 1 % ophthalmic suspension Place 1 drop into the right eye daily. Pt takes 3 to 4 times per week     Probiotic Product (PROBIOTIC DAILY) CAPS Take 1 capsule by mouth daily. Pt is taking 3 to 4 times a week     No current facility-administered medications for this visit.     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   History of asthma 1956-2000   Hypothyroid 2013   Osteopenia    Primary HSV infection of mouth 2008   Sunlight-induced  angio-edema-urticaria 07/11/2011   unknown trigger, neg autoimmune workup   Past Surgical History:  Procedure Laterality Date   COLONOSCOPY     polyp on vocal chord      TONSILLECTOMY  1958   Social History   Socioeconomic History   Marital status: Married    Spouse name: Not on file   Number of children: Not on file   Years of education: Not on file   Highest education level: Not on file  Occupational History   Not on file  Social Needs   Financial resource strain: Not on file   Food insecurity:    Worry: Not on file    Inability: Not on file   Transportation needs:    Medical: Not on file    Non-medical: Not on file  Tobacco Use   Smoking status: Never Smoker   Smokeless tobacco: Never Used  Substance and Sexual Activity   Alcohol use: Yes    Alcohol/week: 0.0 standard drinks    Comment: socially   Drug use: No   Sexual activity: Not on file  Lifestyle   Physical activity:    Days per week: Not on file    Minutes per session: Not on file  Stress: Not on file  Relationships   Social connections:    Talks on phone: Not on file    Gets together: Not on file    Attends religious service: Not on file    Active member of club or organization: Not on file    Attends meetings of clubs or organizations: Not on file    Relationship status: Not on file   Intimate partner violence:    Fear of current or ex partner: Not on file    Emotionally abused: Not on file    Physically abused: Not on file    Forced sexual activity: Not on file  Other Topics Concern   Not on file  Social History Narrative   Lives with husband in a one story home.  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   Family History  Problem Relation Age of Onset   Breast cancer Mother 79   Osteoarthritis Father    Hypertension Father    Leukemia Maternal Aunt 54   Atrial fibrillation Brother    Colon cancer Neg Hx    Esophageal cancer Neg Hx    Inflammatory  bowel disease Neg Hx    Liver disease Neg Hx    Pancreatic cancer Neg Hx    Rectal cancer Neg Hx    Stomach cancer Neg Hx    I have reviewed her medical, social, and family history in detail and updated the electronic medical record as necessary.    PHYSICAL EXAMINATION  Telehealth visit   REVIEW OF DATA  I reviewed the following data at the time of this encounter:  GI Procedures and Studies  March 15, 2018 EGD - Nodule found in the esophagus. Biopsied. - Gastroesophageal junction polypoid lesion were found. Biopsied. - Normal mucosa was found in the entire esophagus otherwise. Biopsied for EoE. - Multiple gastric polyps - fundic gland appearing. Biopsied a few. - Erythematous mucosa in the antrum and prepyloric region of the stomach. - Non-bleeding gastric ulcer with a clean ulcer base (Forrest Class III). - Non-bleeding erosive gastropathy. - No other gross lesions in the stomach. Biopsied for HP. - No gross lesions in the duodenal bulb, in the first portion of the duodenum and in the second portion of the duodenum.  Laboratory Studies  Reviewed in epic   04/06/2018 00:00  Cologuard Positive (A)   Imaging Studies  No new relevant studies to review   ASSESSMENT  Ms. Ibanez is a 68 y.o. female  with a pmh significant for hypothyroidism, allergic rhinitis, arthralgias, history of gastric ulcer found on recent endoscopy, some microscopic acid reflux changes based on pathology at time of EGD.   The patient is seen today for evaluation and management of:  1. Gastroesophageal reflux disease, esophagitis presence not specified   2. Positive colorectal cancer screening using Cologuard test   3. Gastric ulcer without hemorrhage or perforation, unspecified chronicity   4. Pyrosis   5. Atypical chest pain   6. Abnormal findings on esophagogastroduodenoscopy (EGD)    Based on discussion with the patient today she does remain hemodynamically stable.  With that being said,  clinically she has not had a significant improvement being on the twice daily dosing of PPI.  As I had initially talked with her and her husband at our first consultation it was not clear to me that her symptoms were truly just GERD related and there could be the possibility of a hypersensitive esophagus that could be playing a role with  things or potentially nonacid reflux.  Why she has had in the most current changes after her recent bronchitis in the winter is not clear to me but I do remain concerned about her symptoms are persisting even while on high-dose acid suppression medication.  There is no doubt she needs to be on it currently because of her gastric ulcer that needs to heal and hopefully when we repeat an endoscopy in a few weeks we will see that ulceration is healed.  If on EGD she has healed her gastric ulcer but she continues to have persistent symptoms of atypical chest burning/pyrosis-like symptoms that she may require pH impedance testing off PPI to see what things look like.  With a positive Cologuard she will require a diagnostic colonoscopy for further evaluation seen that she had a negative Cologuard in 2017 I think we do have some time to wait for this particular test to be done.  Based on the COVID-19 pandemic currently are resources are limited in being able to proceed with upper and lower endoscopies except in urgent cases and this would be considered a elective case for now.  We will plan to evaluate her in approximately 6 to 8 weeks repeat EGD and diagnostic colonoscopy.  The patient will continue twice daily Nexium through this week and next week will begin once daily Nexium.  If the patient has rebound discomfort she will go back up to twice daily Nexium and alert [name].  The risks and benefits of endoscopic evaluation were discussed with the patient; these include but are not limited to the risk of perforation, infection, bleeding, missed lesions, lack of diagnosis, severe illness  requiring hospitalization, as well as anesthesia and sedation related illnesses.  The patient is agreeable to proceed.  All patient questions were answered, to the best of my ability, and the patient agrees to the aforementioned plan of action with follow-up as indicated.   PLAN  Continue Nexium 40 twice daily for 1 week and then transition to once daily If rebound symptoms then she will go back up to twice daily Repeat EGD in approximately 6 to 8 weeks to evaluate for gastric ulcer healing and reevaluate the esophagus as well Diagnostic colonoscopy in 6 to 8 weeks for evaluation of positive Cologuard Patient will potentially require pH impedance and manometry testing if symptoms persist   No orders of the defined types were placed in this encounter.   New Prescriptions   No medications on file   Modified Medications   No medications on file    Planned Follow Up: No follow-ups on file.   Justice Britain, MD [facility] Office # [phone]

## 2018-04-24 NOTE — Patient Instructions (Signed)
If you are age 69 or older, your body mass index should be between 23-30. Your There is no height or weight on file to calculate BMI. If this is out of the aforementioned range listed, please consider follow up with your Primary Care Provider.  If you are age 4 or younger, your body mass index should be between 19-25. Your There is no height or weight on file to calculate BMI. If this is out of the aformentioned range listed, please consider follow up with your Primary Care Provider.    It has been recommended to you by your physician that you have a(n) 05/2018 Colonoscopy in [place] completed. We did not schedule the procedure(s) today. Please contact our office at [phone] should you decide to have the procedure completed.  Start Nexium once daily as instructed.   You will need telehealth visit 1 week prior to procedure in May 2020.  Thank you for choosing me and [facility].  Dr. Rush Landmark

## 2018-04-25 ENCOUNTER — Encounter: Payer: Self-pay | Admitting: Gastroenterology

## 2018-04-26 DIAGNOSIS — R12 Heartburn: Secondary | ICD-10-CM | POA: Insufficient documentation

## 2018-04-26 DIAGNOSIS — R195 Other fecal abnormalities: Secondary | ICD-10-CM | POA: Insufficient documentation

## 2018-04-26 DIAGNOSIS — K259 Gastric ulcer, unspecified as acute or chronic, without hemorrhage or perforation: Secondary | ICD-10-CM | POA: Insufficient documentation

## 2018-04-26 DIAGNOSIS — R198 Other specified symptoms and signs involving the digestive system and abdomen: Secondary | ICD-10-CM | POA: Insufficient documentation

## 2018-05-07 ENCOUNTER — Ambulatory Visit: Payer: Medicare Other | Admitting: Podiatry

## 2018-06-18 ENCOUNTER — Telehealth: Payer: Self-pay | Admitting: Gastroenterology

## 2018-06-18 NOTE — Telephone Encounter (Signed)
Patient called said that she is suppose to have  EGD/COLON but I only see a recall for the EDG.   Patient also has a questions about when she should stop NEXIUM.

## 2018-06-20 ENCOUNTER — Other Ambulatory Visit: Payer: Self-pay

## 2018-06-20 DIAGNOSIS — K259 Gastric ulcer, unspecified as acute or chronic, without hemorrhage or perforation: Secondary | ICD-10-CM

## 2018-06-20 DIAGNOSIS — R195 Other fecal abnormalities: Secondary | ICD-10-CM

## 2018-06-20 DIAGNOSIS — K219 Gastro-esophageal reflux disease without esophagitis: Secondary | ICD-10-CM

## 2018-06-20 MED ORDER — SUPREP BOWEL PREP KIT 17.5-3.13-1.6 GM/177ML PO SOLN: 1.0000 | ORAL | 0 refills | Status: DC

## 2018-06-21 ENCOUNTER — Ambulatory Visit: Payer: Medicare Other | Admitting: Podiatry

## 2018-06-21 ENCOUNTER — Encounter: Payer: Self-pay | Admitting: Podiatry

## 2018-06-21 ENCOUNTER — Other Ambulatory Visit: Payer: Self-pay

## 2018-06-21 VITALS — Temp 97.3°F

## 2018-06-21 DIAGNOSIS — B351 Tinea unguium: Secondary | ICD-10-CM

## 2018-06-21 DIAGNOSIS — M79675 Pain in left toe(s): Secondary | ICD-10-CM | POA: Diagnosis not present

## 2018-06-21 DIAGNOSIS — M79674 Pain in right toe(s): Secondary | ICD-10-CM

## 2018-06-21 NOTE — Patient Instructions (Signed)

## 2018-06-23 ENCOUNTER — Telehealth: Payer: Self-pay | Admitting: *Deleted

## 2018-06-23 ENCOUNTER — Encounter: Payer: Self-pay | Admitting: Podiatry

## 2018-06-23 NOTE — Telephone Encounter (Signed)
Thank you for update. There is an unexpected emergency for which I will not be available, not sure if just a few days but potentially this entire week. The staff was going to reach out to let my patients know about this for the week. I will not be available for a call. If she has an urgent issue, then the staff can try and be in touch with her about having an urgent visit with one of my colleauges. If she is OK to proceed with the procedure on Tuesday, it will be with one of my partners. If she does not want to have her procedures done at this time with one of the other providers, then she will be able to reschedule them. If she would like a follow up clinic visit to discuss things further, then we can accommodate that as necessary in the next few weeks to go over things before rescheduling. Please let her know we apologize for this, but this was an unexpected emergency that has just come up, and I have updated Dr. Hilarie Fredrickson and my staff about this, just this afternoon. Thank you.

## 2018-06-23 NOTE — Progress Notes (Signed)
Subjective:  Tina Lucas presents to clinic today with cc of  painful, thick, discolored, elongated toenails 1-5 b/l that become tender and cannot cut because of thickness. Pain is aggravated when wearing enclosed shoe gear.   Current Outpatient Medications:  .  Calcium Carbonate-Vitamin D (CALTRATE 600+D) 600-400 MG-UNIT per tablet, Take 1 tablet by mouth daily., Disp: , Rfl:  .  celecoxib (CELEBREX) 100 MG capsule, Take 1 capsule (100 mg total) by mouth 2 (two) times daily., Disp: 60 capsule, Rfl: 12 .  cholecalciferol (VITAMIN D) 400 UNITS TABS tablet, Take 400 Units by mouth., Disp: , Rfl:  .  esomeprazole (NEXIUM) 40 MG capsule, Take 1 capsule (40 mg total) by mouth 2 (two) times daily., Disp: 60 capsule, Rfl: 2 .  fexofenadine (ALLEGRA) 180 MG tablet, Take 180 mg by mouth daily., Disp: , Rfl:  .  fluocinonide cream (LIDEX) 4.32 %, Apply 1 application topically 2 (two) times daily as needed (FOR PSORIASIS). , Disp: , Rfl:  .  levothyroxine (SYNTHROID, LEVOTHROID) 50 MCG tablet, TAKE ONE TABLET BY MOUTH EVERY MORNING BEFORE BREAKFAST, Disp: 90 tablet, Rfl: 1 .  LOTEMAX 0.5 % OINT, , Disp: , Rfl:  .  Magnesium 250 MG TABS, Take 125 mg by mouth daily., Disp: , Rfl:  .  Multiple Vitamins-Minerals (ONE-A-DAY WOMENS 50+ ADVANTAGE PO), Take by mouth daily., Disp: , Rfl:  .  prednisoLONE acetate (PRED FORTE) 1 % ophthalmic suspension, Place 1 drop into the right eye daily. Pt takes 3 to 4 times per week, Disp: , Rfl:  .  Probiotic Product (PROBIOTIC DAILY) CAPS, Take 1 capsule by mouth daily. Pt is taking 3 to 4 times a week, Disp: , Rfl:  .  SUPREP BOWEL PREP KIT 17.5-3.13-1.6 GM/177ML SOLN, Take 1 kit by mouth as directed. For colonoscopy prep, Disp: 2 Bottle, Rfl: 0   No Known Allergies   Objective: Vitals:   06/21/18 1230  Temp: (!) 97.3 F (36.3 C)    Physical Examination:  Vascular Examination: Capillary refill time immediate x 10 digits.  Palpable DP/PT pulses b/l.  Digital  hair present b/l.  No edema noted b/l.  Skin temperature gradient WNL b/l.  Dermatological Examination: Skin with normal turgor, texture and tone b/l.  No open wounds b/l.  No interdigital macerations noted b/l.  Elongated, thick, discolored brittle toenails with subungual debris and pain on dorsal palpation of nailbeds 1-5 b/l.  Musculoskeletal Examination: Muscle strength 5/5 to all muscle groups b/l  No pain, crepitus or joint discomfort with active/passive ROM.  Neurological Examination: Sensation intact 5/5 b/l with 10 gram monofilament.  Vibratory sensation intact b/l.  Proprioceptive sensation intact b/l.  Assessment: Mycotic nail infection with pain 1-5 b/l  Plan: 1. Toenails 1-5 b/l were debrided in length and girth without iatrogenic laceration. 2.  Continue soft, supportive shoe gear daily. 3.  Report any pedal injuries to medical professional. 4.  Follow up 4 months per patient request. 5.  Patient/POA to call should there be a question/concern in there interim.

## 2018-06-23 NOTE — Telephone Encounter (Signed)
Dr. Rush Landmark,  I called this patient today to remind her of her procedure on Tuesday and to go over covid screening.  She spent 20 minutes explaining how she feels now- she is very anxious and upset because she is "not feeling better" from February.  She has "good days and bad days."  The heartburn can wake her up at night sometimes.  She states her sternal area feels "very tender and painful." She would like a phone call before her procedure if possible.

## 2018-06-23 NOTE — Telephone Encounter (Signed)
Covid-19 screening questions  Have you traveled in the last 14 days? no If yes where?  Do you now or have you had a fever in the last 14 days? no  Do you have any respiratory symptoms of shortness of breath or cough now or in the last 14 days? no  Do you have any family members or close contacts with diagnosed or suspected Covid-19 in the past 14 days? no  Have you been tested for Covid-19 and found to be positive? No  Pt is aware that care partner will wait in the car during parking lot- I did remind patient that their care partner needs to stay in the parking lot the entire time. Pt will wear mask into building        

## 2018-06-24 NOTE — Telephone Encounter (Signed)
Called patient and reviewed new times and instructions with her.

## 2018-06-24 NOTE — Telephone Encounter (Signed)
I called patient to reschedule her procedure. She is now scheduled with Dr. Henrene Pastor on 06/26/18 at 2:30pm. She is requesting that [facility] call her with new prep times. She says that "she will not be as long winded" as she was yesterday on the phone.

## 2018-06-24 NOTE — Telephone Encounter (Signed)
Pt has been scheduled for procedure. Also patient has been advised to continue Nexium until her endoscopies.

## 2018-06-25 ENCOUNTER — Encounter: Payer: Medicare Other | Admitting: Gastroenterology

## 2018-06-26 ENCOUNTER — Other Ambulatory Visit: Payer: Self-pay

## 2018-06-26 ENCOUNTER — Ambulatory Visit (AMBULATORY_SURGERY_CENTER): Payer: Medicare Other | Admitting: Internal Medicine

## 2018-06-26 ENCOUNTER — Encounter: Payer: Self-pay | Admitting: Internal Medicine

## 2018-06-26 VITALS — BP 177/95 | HR 39 | Temp 99.0°F | Resp 17 | Ht 68.0 in | Wt 125.0 lb

## 2018-06-26 DIAGNOSIS — D124 Benign neoplasm of descending colon: Secondary | ICD-10-CM

## 2018-06-26 DIAGNOSIS — K253 Acute gastric ulcer without hemorrhage or perforation: Secondary | ICD-10-CM

## 2018-06-26 DIAGNOSIS — K635 Polyp of colon: Secondary | ICD-10-CM

## 2018-06-26 DIAGNOSIS — K219 Gastro-esophageal reflux disease without esophagitis: Secondary | ICD-10-CM | POA: Diagnosis not present

## 2018-06-26 DIAGNOSIS — K449 Diaphragmatic hernia without obstruction or gangrene: Secondary | ICD-10-CM

## 2018-06-26 DIAGNOSIS — K317 Polyp of stomach and duodenum: Secondary | ICD-10-CM

## 2018-06-26 DIAGNOSIS — R195 Other fecal abnormalities: Secondary | ICD-10-CM | POA: Diagnosis not present

## 2018-06-26 MED ORDER — SODIUM CHLORIDE 0.9 % IV SOLN: 500.0000 mL | Freq: Once | INTRAVENOUS | Status: DC

## 2018-06-26 NOTE — Op Note (Signed)
[facility] Patient Name: Dory Demont Procedure Date: 06/26/2018 3:00 PM MRN: [id number] Endoscopist: Docia Chuck. Henrene Pastor , MD Age: 68 Referring MD:  Date of Birth: Jun 18, 1950 Gender: Female Account #: [id number] Procedure:                Colonoscopy with cold snare polypectomy x 1 Indications:              Positive Cologuard test Medicines:                Monitored Anesthesia Care Procedure:                Pre-Anesthesia Assessment:                           - Prior to the procedure, a History and Physical                            was performed, and patient medications and                            allergies were reviewed. The patient's tolerance of                            previous anesthesia was also reviewed. The risks                            and benefits of the procedure and the sedation                            options and risks were discussed with the patient.                            All questions were answered, and informed consent                            was obtained. Prior Anticoagulants: The patient has                            taken no previous anticoagulant or antiplatelet                            agents. ASA Grade Assessment: II - A patient with                            mild systemic disease. After reviewing the risks                            and benefits, the patient was deemed in                            satisfactory condition to undergo the procedure.                           After obtaining informed consent, the colonoscope  was passed under direct vision. Throughout the                            procedure, the patient's blood pressure, pulse, and                            oxygen saturations were monitored continuously. The                            Colonoscope was introduced through the anus and                            advanced to the the cecum, identified by                            appendiceal orifice  and ileocecal valve. The                            terminal ileum, ileocecal valve, appendiceal                            orifice, and rectum were photographed. The quality                            of the bowel preparation was excellent. The                            colonoscopy was performed without difficulty. The                            patient tolerated the procedure well. The bowel                            preparation used was SUPREP via split dose                            instruction. Scope In: 3:18:54 PM Scope Out: 3:38:47 PM Scope Withdrawal Time: 0 hours 14 minutes 20 seconds  Total Procedure Duration: 0 hours 19 minutes 53 seconds  Findings:                 The terminal ileum appeared normal.                           A 2 mm polyp was found in the descending colon. The                            polyp was removed with a cold snare. Resection and                            retrieval were complete.                           The exam was otherwise without abnormality on  direct and retroflexion views. Complications:            No immediate complications. Estimated blood loss:                            None. Estimated Blood Loss:     Estimated blood loss: none. Impression:               - The examined portion of the ileum was normal.                           - One 2 mm polyp in the descending colon, removed                            with a cold snare. Resected and retrieved.                           - The examination was otherwise normal on direct                            and retroflexion views. Recommendation:           - Repeat colonoscopy is not recommended for                            surveillance.                           - Patient has a contact number available for                            emergencies. The signs and symptoms of potential                            delayed complications were discussed with the                             patient. Return to normal activities tomorrow.                            Written discharge instructions were provided to the                            patient.                           - Resume previous diet.                           - Continue present medications.                           - Await pathology results.                           - EGD today. Please see report Docia Chuck. Henrene Pastor, MD 06/26/2018 3:43:57 PM This report has been signed electronically.

## 2018-06-26 NOTE — Progress Notes (Signed)
Reviewed history

## 2018-06-26 NOTE — Patient Instructions (Addendum)
YOU HAD AN ENDOSCOPIC PROCEDURE TODAY AT [facility]:   Refer to the procedure report that was given to you for any specific questions about what was found during the examination.  If the procedure report does not answer your questions, please call your gastroenterologist to clarify.  If you requested that your care partner not be given the details of your procedure findings, then the procedure report has been included in a sealed envelope for you to review at your convenience later.  ** Handouts given on polyps**   YOU SHOULD EXPECT: Some feelings of bloating in the abdomen. Passage of more gas than usual.  Walking can help get rid of the air that was put into your GI tract during the procedure and reduce the bloating. If you had a lower endoscopy (such as a colonoscopy or flexible sigmoidoscopy) you may notice spotting of blood in your stool or on the toilet paper. If you underwent a bowel prep for your procedure, you may not have a normal bowel movement for a few days.  Please Note:  You might notice some irritation and congestion in your nose or some drainage.  This is from the oxygen used during your procedure.  There is no need for concern and it should clear up in a day or so.  SYMPTOMS TO REPORT IMMEDIATELY:   Following lower endoscopy (colonoscopy or flexible sigmoidoscopy):  Excessive amounts of blood in the stool  Significant tenderness or worsening of abdominal pains  Swelling of the abdomen that is new, acute  Fever of 100F or higher   Following upper endoscopy (EGD)  Vomiting of blood or coffee ground material  New chest pain or pain under the shoulder blades  Painful or persistently difficult swallowing  New shortness of breath  Fever of 100F or higher  Black, tarry-looking stools  For urgent or emergent issues, a gastroenterologist can be reached at any hour by calling [phone].   DIET:  We do recommend a small meal at first, but then you may  proceed to your regular diet.  Drink plenty of fluids but you should avoid alcoholic beverages for 24 hours.  ACTIVITY:  You should plan to take it easy for the rest of today and you should NOT DRIVE or use heavy machinery until tomorrow (because of the sedation medicines used during the test).    FOLLOW UP: Our staff will call the number listed on your records 48-72 hours following your procedure to check on you and address any questions or concerns that you may have regarding the information given to you following your procedure. If we do not reach you, we will leave a message.  We will attempt to reach you two times.  During this call, we will ask if you have developed any symptoms of COVID 19. If you develop any symptoms (ie: fever, flu-like symptoms, shortness of breath, cough etc.) before then, please call [phone].  If you test positive for Covid 19 in the 2 weeks post procedure, please call and report this information to [name].    If any biopsies were taken you will be contacted by phone or by letter within the next 1-3 weeks.  Please call us at [phone] if you have not heard about the biopsies in 3 weeks.    SIGNATURES/CONFIDENTIALITY: You and/or your care partner have signed paperwork which will be entered into your electronic medical record.  These signatures attest to the fact that that the information above on your After  Visit Summary has been reviewed and is understood.  Full responsibility of the confidentiality of this discharge information lies with you and/or your care-partner. 

## 2018-06-26 NOTE — Progress Notes (Signed)
Called to room to assist during endoscopic procedure.  Patient ID and intended procedure confirmed with present staff. Received instructions for my participation in the procedure from the performing physician.  

## 2018-06-26 NOTE — Progress Notes (Signed)
A and O x3. Report to RN. Tolerated MAC anesthesia well.

## 2018-06-26 NOTE — Op Note (Signed)
[place] Patient Name: Tina Lucas Procedure Date: 06/26/2018 2:59 PM MRN: [id number] Endoscopist: Docia Chuck. Henrene Pastor , MD Age: 68 Referring MD:  Date of Birth: 29-Jul-1950 Gender: Female Account #: [id number] Procedure:                Upper GI endoscopy Indications:              Follow-up of acute gastric ulcer. Esophageal nodule                            (removed with unremarkable pathology). Previous                            examination February 2020 with Dr. Rush Landmark Medicines:                Monitored Anesthesia Care Procedure:                Pre-Anesthesia Assessment:                           - Prior to the procedure, a History and Physical                            was performed, and patient medications and                            allergies were reviewed. The patient's tolerance of                            previous anesthesia was also reviewed. The risks                            and benefits of the procedure and the sedation                            options and risks were discussed with the patient.                            All questions were answered, and informed consent                            was obtained. Prior Anticoagulants: The patient has                            taken no previous anticoagulant or antiplatelet                            agents. ASA Grade Assessment: II - A patient with                            mild systemic disease. After reviewing the risks                            and benefits, the patient was deemed in  satisfactory condition to undergo the procedure.                           After obtaining informed consent, the endoscope was                            passed under direct vision. Throughout the                            procedure, the patient's blood pressure, pulse, and                            oxygen saturations were monitored continuously. The                            Endoscope was  introduced through the mouth, and                            advanced to the second part of duodenum. The upper                            GI endoscopy was accomplished without difficulty.                            The patient tolerated the procedure well. Scope In: Scope Out: Findings:                 The esophagus was normal. No residual abnormalities                            at previous nodule resection site.                           The stomach was normal save a few small benign                            fundic gland type polyps and small hiatal hernia.                            Previous area of ulceration has healed.                           The examined duodenum was normal.                           The cardia and gastric fundus were normal on                            retroflexion. Complications:            No immediate complications. Estimated Blood Loss:     Estimated blood loss: none. Impression:               1. Essentially normal EGD. See report. Recommendation:           - Patient has a contact number available for  emergencies. The signs and symptoms of potential                            delayed complications were discussed with the                            patient. Return to normal activities tomorrow.                            Written discharge instructions were provided to the                            patient.                           - Resume previous diet.                           - Continue present medications.                           - Return to the care of your primary provider.                           - GI follow-up as deemed necessary by Dr. Farrel Gobble. Henrene Pastor, MD 06/26/2018 3:52:36 PM This report has been signed electronically.

## 2018-06-28 ENCOUNTER — Telehealth: Payer: Self-pay

## 2018-06-28 NOTE — Telephone Encounter (Signed)
  Follow up Call-  Call back number 06/26/2018 03/15/2018  Post procedure Call Back phone  # [phone]  Permission to leave phone message Yes Yes  Some recent data might be hidden     Patient questions:  Do you have a fever, pain , or abdominal swelling? No. Pain Score  0 *  Have you tolerated food without any problems? Yes.    Have you been able to return to your normal activities? Yes.    Do you have any questions about your discharge instructions: Diet   No. Medications  No. Follow up visit  No.  Do you have questions or concerns about your Care? No.  Actions: * If pain score is 4 or above: No action needed, pain <4.  1. Have you developed a fever since your procedure? no  2.   Have you had an respiratory symptoms (SOB or cough) since your procedure? no  3.   Have you tested positive for COVID 19 since your procedure no  4.   Have you had any family members/close contacts diagnosed with the COVID 19 since your procedure?  no   If yes to any of these questions please route to Joylene John, RN and Alphonsa Gin, Therapist, sports.

## 2018-06-28 NOTE — Telephone Encounter (Signed)
Called [phone] and left a messaged we tried to reach pt for a follow up call. maw

## 2018-07-02 ENCOUNTER — Encounter: Payer: Self-pay | Admitting: Internal Medicine

## 2018-07-21 NOTE — Progress Notes (Signed)
Subjective:    Patient ID: Tina Lucas, female    DOB: 09-19-1950, 68 y.o.   MRN: [id number]  HPI The patient is here for follow up.  She has been experiencing chest discomfort and GI issues for several months now.  She has been following with GI.  She did have an EGD in February and was found to have a gastric ulcer was placed on Nexium twice daily.  She did take this as prescribed because she knew it was healing the ulcer, but it never helped her heartburn or chest issues.  She feels worse over the past 4 months despite taking the Nexium and wants to get off of it.  Earlier this month she had a repeat endoscopy and the ulcer has healed.  She did start to take the medication every other day on her own and is currently taking every 3 days.  Her symptoms are not related to when she takes it when she does not take it.  She does have some GERD symptoms at times and sometimes it feels like she has difficulty swallowing as if food gets stuck or not passing as it should.  Her esophagus did look normal on her last EGD.  Her chest issues are a soreness or burning in her chest-sternal area.  She has shortness of breath some days when walking, playing pickle ball or even talking.  She feels a tightness in her chest and increased chest discomfort when wearing support bras.  She has trouble sleeping due to his discomfort in her chest.  She is unsure what to do at this time.  She cannot go a couple of days without having chest symptoms.      Medications and allergies reviewed with patient and updated if appropriate.  Patient Active Problem List   Diagnosis Date Noted  . Pyrosis 04/26/2018  . Positive colorectal cancer screening using Cologuard test 04/26/2018  . Gastric ulcer without hemorrhage or perforation 04/26/2018  . Abnormal findings on esophagogastroduodenoscopy (EGD) 04/26/2018  . History of bronchitis 03/12/2018  . Atypical chest pain 02/26/2018  . Asthma 01/31/2018  . Chronic neck pain  08/27/2017  . GERD (gastroesophageal reflux disease) 04/20/2016  . Numbness 08/06/2015  . Osteopenia 12/10/2014  . Allergic urticaria 12/10/2014  . Pressure in head 04/01/2014  . Dizziness 02/10/2014  . Elevated blood pressure reading 11/06/2013  . Hypothyroid   . Primary HSV infection of mouth     Current Outpatient Medications on File Prior to Visit  Medication Sig Dispense Refill  . Calcium Carbonate-Vitamin D (CALTRATE 600+D) 600-400 MG-UNIT per tablet Take 1 tablet by mouth daily.    . celecoxib (CELEBREX) 100 MG capsule Take 1 capsule (100 mg total) by mouth 2 (two) times daily. 60 capsule 12  . cholecalciferol (VITAMIN D) 400 UNITS TABS tablet Take 400 Units by mouth.    . fexofenadine (ALLEGRA) 180 MG tablet Take 180 mg by mouth daily.    . fluocinonide cream (LIDEX) 8.46 % Apply 1 application topically 2 (two) times daily as needed (FOR PSORIASIS).     Marland Kitchen levothyroxine (SYNTHROID, LEVOTHROID) 50 MCG tablet TAKE ONE TABLET BY MOUTH EVERY MORNING BEFORE BREAKFAST 90 tablet 1  . LOTEMAX 0.5 % OINT     . Magnesium 250 MG TABS Take 125 mg by mouth daily.    . Multiple Vitamins-Minerals (ONE-A-DAY WOMENS 50+ ADVANTAGE PO) Take by mouth daily.    . prednisoLONE acetate (PRED FORTE) 1 % ophthalmic suspension Place 1 drop into the right  eye daily. Pt takes 3 to 4 times per week    . Probiotic Product (PROBIOTIC DAILY) CAPS Take 1 capsule by mouth daily. Pt is taking 3 to 4 times a week     No current facility-administered medications on file prior to visit.     Past Medical History:  Diagnosis Date  . Allergic rhinitis   . Allergy   . Asthma    as a child  . 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 History of asthma 1956-2000  . Hypothyroid 2013  . Osteopenia   . Primary HSV infection of mouth 2008  . Sunlight-induced angio-edema-urticaria 07/11/2011   unknown trigger, neg autoimmune workup    Past Surgical History:  Procedure Laterality  Date  . COLONOSCOPY    . polyp on vocal chord     . TONSILLECTOMY  1958    Social History   Socioeconomic History  . Marital status: Married    Spouse name: Not on file  . Number of children: Not on file  . Years of education: Not on file  . Highest education level: Not on file  Occupational History  . Not on file  Social Needs  . Financial resource strain: Not on file  . Food insecurity    Worry: Not on file    Inability: Not on file  . Transportation needs    Medical: Not on file    Non-medical: Not on file  Tobacco Use  . Smoking status: Never Smoker  . Smokeless tobacco: Never Used  Substance and Sexual Activity  . Alcohol use: Yes    Alcohol/week: 0.0 standard drinks    Comment: socially  . Drug use: No  . Sexual activity: Not on file  Lifestyle  . Physical activity    Days per week: Not on file    Minutes per session: Not on file  . Stress: Not on file  Relationships  . Social Herbalist on phone: Not on file    Gets together: Not on file    Attends religious service: Not on file    Active member of club or organization: Not on file    Attends meetings of clubs or organizations: Not on file    Relationship status: Not on file  Other Topics Concern  . Not on file  Social History Narrative   Lives with husband in a one story home.  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    Family History  Problem Relation Age of Onset  . Breast cancer Mother 9  . Osteoarthritis Father   . Hypertension Father   . Leukemia Maternal Aunt 70  . Atrial fibrillation Brother   . Colon cancer Neg Hx   . Esophageal cancer Neg Hx   . Inflammatory bowel disease Neg Hx   . Liver disease Neg Hx   . Pancreatic cancer Neg Hx   . Rectal cancer Neg Hx   . Stomach cancer Neg Hx     Review of Systems  Constitutional: Negative for chills and fever.  Respiratory: Positive for shortness of breath (while talking, sometimes with walking hills, occ with high  intensity pickleball). Negative for cough and wheezing.   Cardiovascular: Positive for chest pain. Negative for palpitations.       Objective:   Vitals:   07/22/18 1439  BP: (!) 164/86  Pulse: 75  Resp: 16  Temp: 98.6 F (37 C)  SpO2: 97%  BP Readings from Last 3 Encounters:  07/22/18 (!) 164/86  06/26/18 (!) 177/95  03/29/18 134/82   Wt Readings from Last 3 Encounters:  07/22/18 123 lb 12.8 oz (56.2 kg)  06/26/18 125 lb (56.7 kg)  03/29/18 125 lb (56.7 kg)   Body mass index is 18.82 kg/m.   Physical Exam    Constitutional: Appears well-developed and well-nourished. No distress.  HENT:  Head: Normocephalic and atraumatic.  Neck: Neck supple. No tracheal deviation present. No thyromegaly present.  No cervical lymphadenopathy Cardiovascular: Normal rate, regular rhythm and normal heart sounds.  No murmur heard. No carotid bruit .  No edema Pulmonary/Chest: Some chest wall discomfort with palpation and movement, but this is not consistent effort normal and breath sounds normal. No respiratory distress. No has no wheezes. No rales.  Abdomen:  Soft, NT, ND Skin: Skin is warm and dry. Not diaphoretic.  Psychiatric: Normal mood and affect. Behavior is normal.      Assessment & Plan:    See Problem List for Assessment and Plan of chronic medical problems.

## 2018-07-22 ENCOUNTER — Encounter: Payer: Self-pay | Admitting: Internal Medicine

## 2018-07-22 ENCOUNTER — Ambulatory Visit (INDEPENDENT_AMBULATORY_CARE_PROVIDER_SITE_OTHER): Payer: Medicare Other | Admitting: Internal Medicine

## 2018-07-22 ENCOUNTER — Other Ambulatory Visit: Payer: Self-pay

## 2018-07-22 DIAGNOSIS — R0789 Other chest pain: Secondary | ICD-10-CM

## 2018-07-22 DIAGNOSIS — K259 Gastric ulcer, unspecified as acute or chronic, without hemorrhage or perforation: Secondary | ICD-10-CM

## 2018-07-22 DIAGNOSIS — K219 Gastro-esophageal reflux disease without esophagitis: Secondary | ICD-10-CM

## 2018-07-22 MED ORDER — FAMOTIDINE 20 MG PO TABS: 20.0000 mg | ORAL_TABLET | Freq: Every day | ORAL | 5 refills | Status: DC

## 2018-07-22 NOTE — Assessment & Plan Note (Signed)
Has some GERD symptoms that are mild States Nexium never help with her GERD symptoms Will stop Nexium-she is only taking it every 3 days at this time Start Pepcid 20 mg daily Tums as needed

## 2018-07-22 NOTE — Assessment & Plan Note (Signed)
Has been going on for a few months Does not occur daily Has seen cardiology-not cardiac Unlikely GI related Discomfort sounds more musculoskeletal-she is very active-plays pickle ball and walks currently She also has scoliosis, which could be contributing We will refer to Dr. Tamala Julian for further evaluation to confirm some of her chest discomfort is musculoskeletal and not all GI related Deferred gabapentin Taking Celebrex 1 pill daily Can try heat, ice or topical medications

## 2018-07-22 NOTE — Patient Instructions (Addendum)
   Medications reviewed and updated.  Changes include :   Stop the nexium and start pepcid daily.  Take Tums as needed.    Your prescription(s) have been submitted to your pharmacy. Please take as directed and contact our office if you believe you are having problem(s) with the medication(s).   See Dr Charlann Boxer for evaluation of your chest pain.

## 2018-07-22 NOTE — Assessment & Plan Note (Signed)
Repeat endoscopy showed healing of her ulcer Has already started tapering off Nexium and is currently taking every 3 days-we will discontinue She does not feel that this is helped with any heartburn that she has-start Pepcid 20 mg daily Takes Tums as needed

## 2018-07-31 ENCOUNTER — Ambulatory Visit (INDEPENDENT_AMBULATORY_CARE_PROVIDER_SITE_OTHER): Payer: Medicare Other | Admitting: Family Medicine

## 2018-07-31 ENCOUNTER — Other Ambulatory Visit: Payer: Self-pay

## 2018-07-31 ENCOUNTER — Encounter: Payer: Self-pay | Admitting: Family Medicine

## 2018-07-31 ENCOUNTER — Ambulatory Visit (INDEPENDENT_AMBULATORY_CARE_PROVIDER_SITE_OTHER)
Admission: RE | Admit: 2018-07-31 | Discharge: 2018-07-31 | Disposition: A | Discharge status: Home / Self Care | Payer: Medicare Other | Source: Ambulatory Visit | Attending: Family Medicine | Admitting: Family Medicine

## 2018-07-31 VITALS — BP 124/84 | HR 76 | Ht 68.0 in | Wt 123.0 lb

## 2018-07-31 DIAGNOSIS — R079 Chest pain, unspecified: Secondary | ICD-10-CM

## 2018-07-31 DIAGNOSIS — J4520 Mild intermittent asthma, uncomplicated: Secondary | ICD-10-CM

## 2018-07-31 DIAGNOSIS — R0789 Other chest pain: Secondary | ICD-10-CM

## 2018-07-31 MED ORDER — ALBUTEROL SULFATE (2.5 MG/3ML) 0.083% IN NEBU: 2.5000 mg | INHALATION_SOLUTION | Freq: Four times a day (QID) | RESPIRATORY_TRACT | 1 refills | Status: DC | PRN

## 2018-07-31 MED ORDER — ALBUTEROL SULFATE HFA 108 (90 BASE) MCG/ACT IN AERS: 2.0000 | INHALATION_SPRAY | RESPIRATORY_TRACT | 1 refills | Status: DC | PRN

## 2018-07-31 NOTE — Assessment & Plan Note (Signed)
I believe the patient's chest wall discomfort is more pulmonary in nature.  Seems to get worse with some activity.  We discussed with her to have further work-up with cardiology again which patient has declined.  Patient was given a albuterol inhaler today.  I would like her to try to use in 30 minutes beforehand secondary to the asthma.  When looking at patient's previous chest x-rays I am concerned for an underlying COPD that could be contributing.  There is a possibility for some mild scarring of the lungs from patient's bronchitis in the last year here as well.  Patient has had no fevers chills or any abnormal weight loss.  Patient will try this acute changes but at the same time she was still significantly anxious and did not feel confident that this was the diagnosis.  Due to the longevity of this problem I did discuss with her about a CT scan of the chest for further evaluation.  We will order this further discussed with patient.

## 2018-07-31 NOTE — Patient Instructions (Addendum)
Use inhaler 30 mins before exercise 1 time a day Will discuss CT results through [facility] and will discuss follow up then  Call [facility] to schedule CT [phone] If pain worsens please visit the emergency department

## 2018-07-31 NOTE — Progress Notes (Signed)
[facility] [address], [place] [address] Phone: [phone] Subjective:   I Kandace Blitz am serving as a Education administrator for Dr. Hulan Saas.  I'm seeing this patient by the request  of:  Binnie Rail, MD   CC:  Chest wall pain   JJO:[id number]  Tina Lucas is a 68 y.o. female coming in with complaint of sternum/chest pain. Weakness in the chest. Shortness of breath. Any type of pressure on her chest causes stress on her stress even wearing a sports bra. Very active in pickle ball and tennis. States she cant finish a game due to pressure in her chest. Hasn't participated in intense pickle ball since march. With talking she is also winded. History of asthma. Patient is anxious about her chest issue. No wheezing.   Onset- February 21st since endoscopy (early 94s) Location - Sternum Character- bruising  Aggravating factors- breathing, talking  Reliving factors-decreasing activity Therapies tried-patient has been treated for gastroesophageal reflux disease without any significant improvement.  Patient has seen cardiology in the past as well as formal physical therapy for chest pain and neck pain within the last 5 months.  Last EKG that was independently visualized by me was in March 30 and did have a bundle branch block Severity-8 or 9 out of 10     Past Medical History:  Diagnosis Date  . Allergic rhinitis   . Allergy   . Asthma    as a child  . 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 History of asthma 1956-2000  . Hypothyroid 2013  . Osteopenia   . Primary HSV infection of mouth 2008  . 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 COLONOSCOPY    . polyp on vocal chord     . TONSILLECTOMY  1958   Social History   Socioeconomic History  . Marital status: Married    Spouse name: Not on file  . Number  of children: Not on file  . Years of education: Not on file  . Highest education level: Not on file  Occupational History  . Not on file  Social Needs  . Financial resource strain: Not on file  . Food insecurity    Worry: Not on file    Inability: Not on file  . Transportation needs    Medical: Not on file    Non-medical: Not on file  Tobacco Use  . Smoking status: Never Smoker  . Smokeless tobacco: Never Used  Substance and Sexual Activity  . Alcohol use: Yes    Alcohol/week: 0.0 standard drinks    Comment: socially  . Drug use: No  . Sexual activity: Not on file  Lifestyle  . Physical activity    Days per week: Not on file    Minutes per session: Not on file  . Stress: Not on file  Relationships  . Social Herbalist on phone: Not on file    Gets together: Not on file    Attends religious service: Not on file    Active member of club or organization: Not on file    Attends meetings of clubs or organizations: Not on file    Relationship status: Not on file  Other Topics Concern  . Not on file  Social History Narrative   Lives with husband in a one Tina home.  Retired school  Pharmacist, hospital.  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   No Known Allergies Family History  Problem Relation Age of Onset  . Breast cancer Mother 13  . Osteoarthritis Father   . Hypertension Father   . Leukemia Maternal Aunt 94  . Atrial fibrillation Brother   . Colon cancer Neg Hx   . Esophageal cancer Neg Hx   . Inflammatory bowel disease Neg Hx   . Liver disease Neg Hx   . Pancreatic cancer Neg Hx   . Rectal cancer Neg Hx   . Stomach cancer Neg Hx     Current Outpatient Medications (Endocrine & Metabolic):  .  levothyroxine (SYNTHROID, LEVOTHROID) 50 MCG tablet, TAKE ONE TABLET BY MOUTH EVERY MORNING BEFORE BREAKFAST   Current Outpatient Medications (Respiratory):  .  fexofenadine (ALLEGRA) 180 MG tablet, Take 180 mg by mouth daily. Marland Kitchen  albuterol (PROVENTIL) (2.5 MG/3ML) 0.083% nebulizer  solution, Take 3 mLs (2.5 mg total) by nebulization every 6 (six) hours as needed for wheezing or shortness of breath.  Current Outpatient Medications (Analgesics):  .  celecoxib (CELEBREX) 100 MG capsule, Take 1 capsule (100 mg total) by mouth 2 (two) times daily.   Current Outpatient Medications (Other):  Marland Kitchen  Calcium Carbonate-Vitamin D (CALTRATE 600+D) 600-400 MG-UNIT per tablet, Take 1 tablet by mouth daily. .  cholecalciferol (VITAMIN D) 400 UNITS TABS tablet, Take 400 Units by mouth. .  famotidine (PEPCID) 20 MG tablet, Take 1 tablet (20 mg total) by mouth daily. .  fluocinonide cream (LIDEX) 6.60 %, Apply 1 application topically 2 (two) times daily as needed (FOR PSORIASIS).  .  LOTEMAX 0.5 % OINT,  .  Magnesium 250 MG TABS, Take 125 mg by mouth daily. .  Multiple Vitamins-Minerals (ONE-A-DAY WOMENS 50+ ADVANTAGE PO), Take by mouth daily. .  prednisoLONE acetate (PRED FORTE) 1 % ophthalmic suspension, Place 1 drop into the right eye daily. Pt takes 3 to 4 times per week .  Probiotic Product (PROBIOTIC DAILY) CAPS, Take 1 capsule by mouth daily. Pt is taking 3 to 4 times a week    Past medical history, social, surgical and family history all reviewed in electronic medical record.  No pertanent information unless stated regarding to the chief complaint.   Review of Systems:  No headache, visual changes, nausea, vomiting, diarrhea, constipation, dizziness, abdominal pain, skin rash, fevers, chills, night sweats, weight loss, swollen lymph nodes, body aches, joint swelling, , shortness of breath, mood changes.  Positive muscle aches, chest pain  Objective  Blood pressure 124/84, pulse 76, height 5\' 8"  (1.727 m), weight 123 lb (55.8 kg), SpO2 96 %.    General: No apparent distress alert and oriented x3 mood and affect normal, dressed appropriately.  Patient is quite anxious does talk a significant amount HEENT: Pupils equal, extraocular movements intact  Respiratory: Patient's speak in  full sentences and does not appear short of breath patient does have some mild crackles diffusely Cardiovascular: No lower extremity edema, non tender, no erythema  Skin: Warm dry intact with no signs of infection or rash on extremities or on axial skeleton.  Abdomen: Soft nontender  Neuro: Cranial nerves II through XII are intact, neurovascularly intact in all extremities with 2+ DTRs and 2+ pulses.  Lymph: No lymphadenopathy of posterior or anterior cervical chain or axillae bilaterally.  Gait normal with good balance and coordination.  MSK:  tender with full range of motion and good stability and symmetric strength and tone of shoulders, elbows, wrist, hip, knee and ankles  bilaterally.  Very minimal arthritic changes of any joints    Impression and Recommendations:     This case required medical decision making of moderate complexity. The above documentation has been reviewed and is accurate and complete Lyndal Pulley, DO       Note: This dictation was prepared with Dragon dictation along with smaller phrase technology. Any transcriptional errors that result from this process are unintentional.

## 2018-08-01 ENCOUNTER — Ambulatory Visit
Admission: RE | Admit: 2018-08-01 | Discharge: 2018-08-01 | Disposition: A | Discharge status: Home / Self Care | Payer: Medicare Other | Source: Ambulatory Visit | Attending: Family Medicine | Admitting: Family Medicine

## 2018-08-01 DIAGNOSIS — R079 Chest pain, unspecified: Secondary | ICD-10-CM

## 2018-08-01 IMAGING — CT CT CHEST WITH CONTRAST
1 series · 15 of 32 positions shown, 19 images · IV contrast (iopamidol)
Comparison: Chest x-ray 07/31/2018.  Chest x-ray 03/12/2018.

CLINICAL DATA: Abnormal chest x-ray.Shortness of breath with
exertion.

EXAM:
CT CHEST WITH CONTRAST
TECHNIQUE: Multidetector CT imaging of the chest was performed during
intravenous contrast administration.
CONTRAST:  75mL 42P9QA-VQQ IOPAMIDOL (42P9QA-VQQ) INJECTION 61%

[Series 4: chest 2.00 br40 s3 · coronal · 0.66mm/px · 15 of 124 slices shown, 19 images]
[im 10/124  mediastinal]
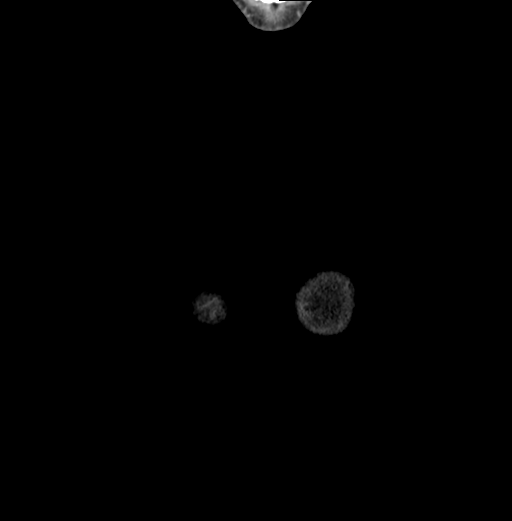
[im 10/124  lung]
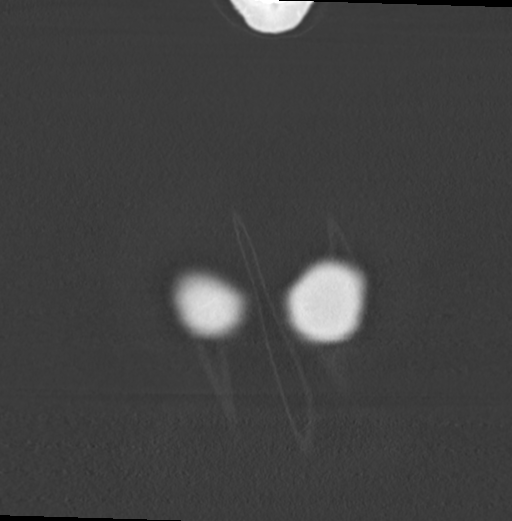
[im 19/124  lung]
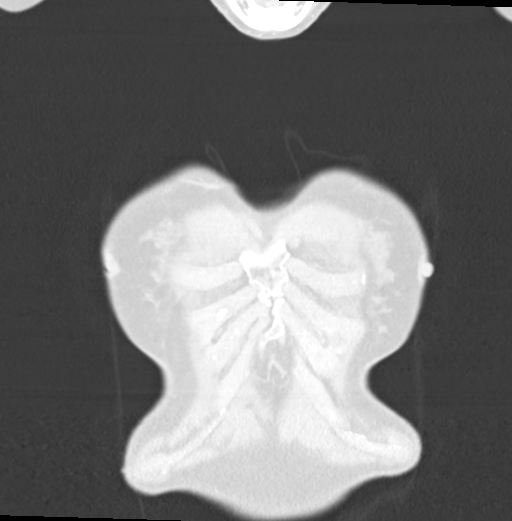
[im 25/124  lung]
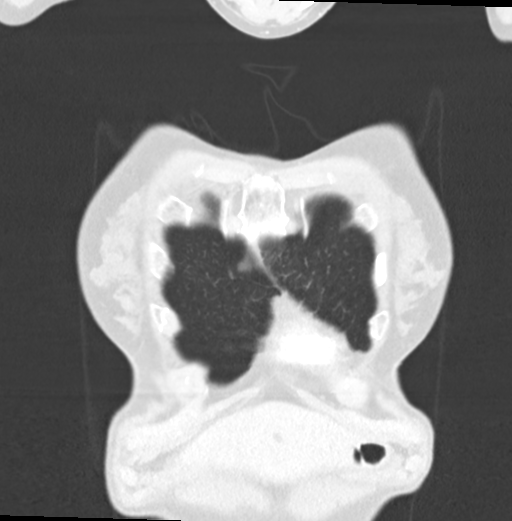
[im 32/124  lung]
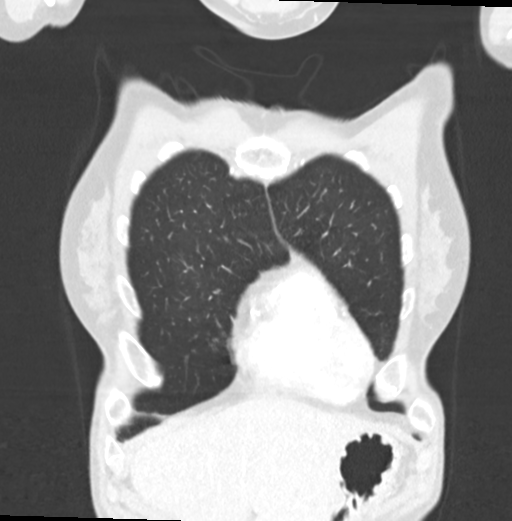
[im 42/124  mediastinal]
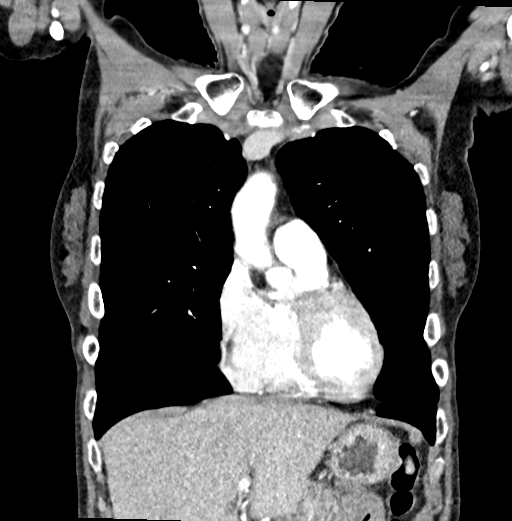
[im 42/124  lung]
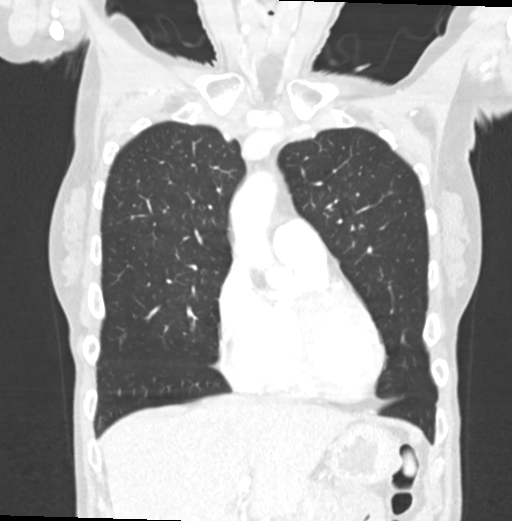
[im 50/124  lung]
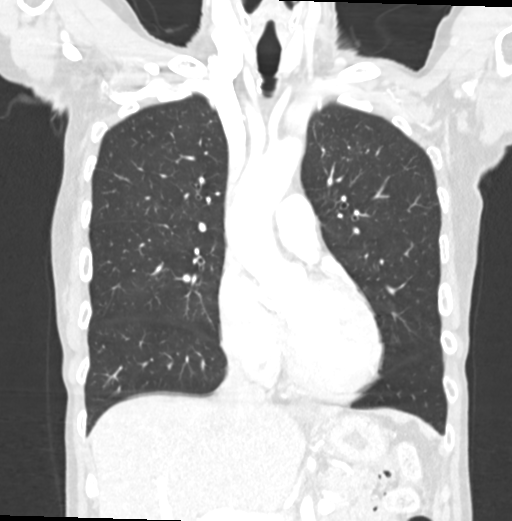
[im 55/124  lung]
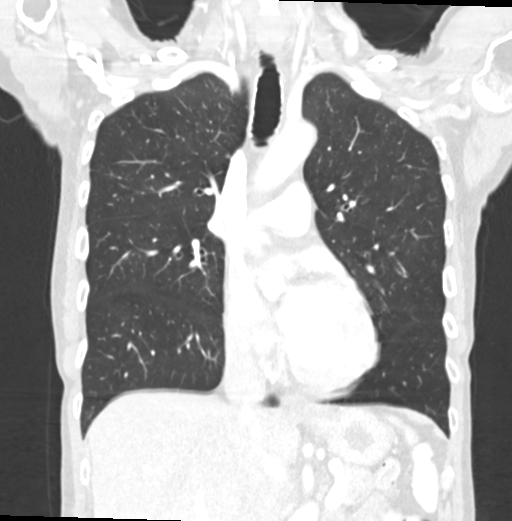
[im 64/124  lung]
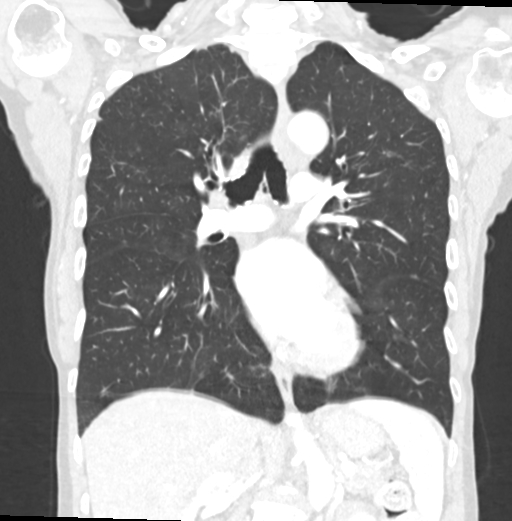
[im 73/124  mediastinal]
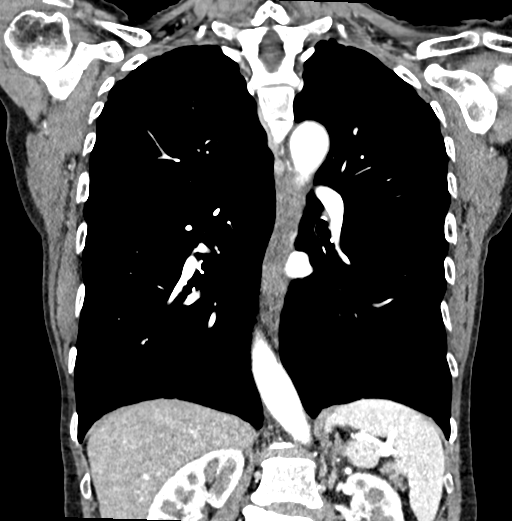
[im 73/124  lung]
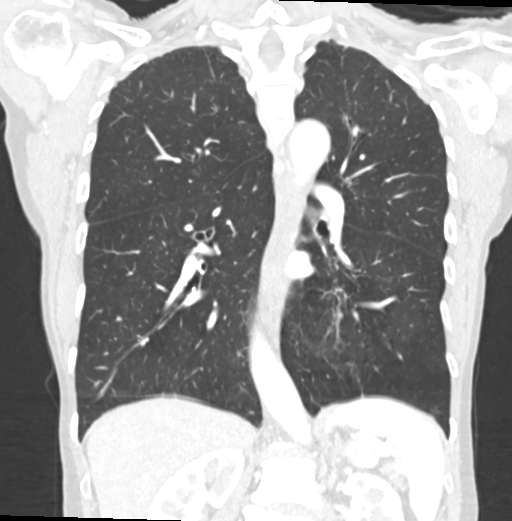
[im 78/124  lung]
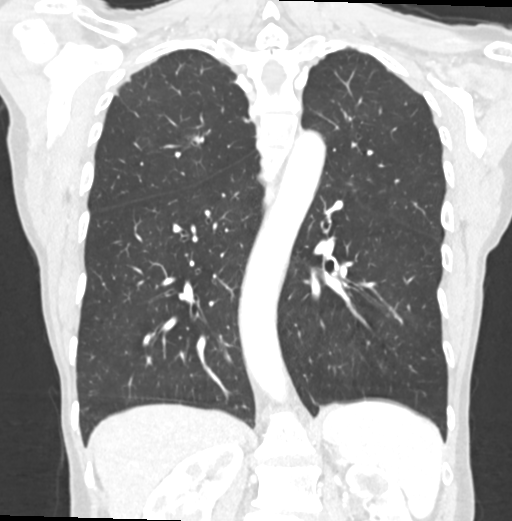
[im 87/124  lung]
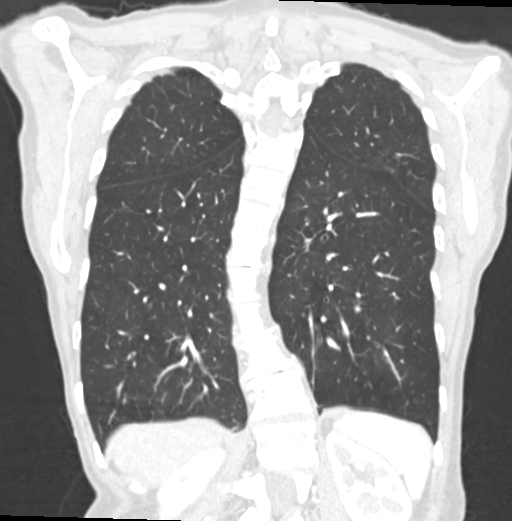
[im 96/124  lung]
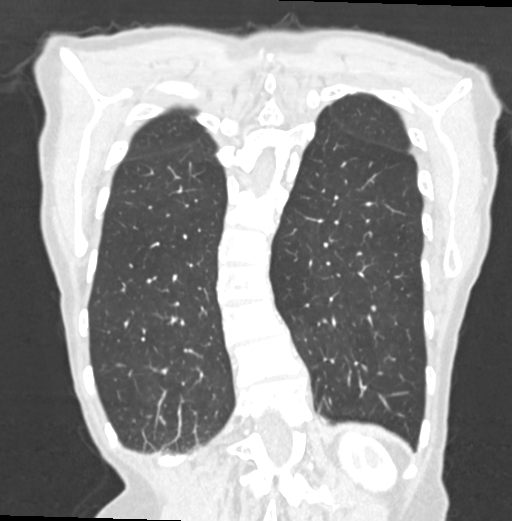
[im 101/124  mediastinal]
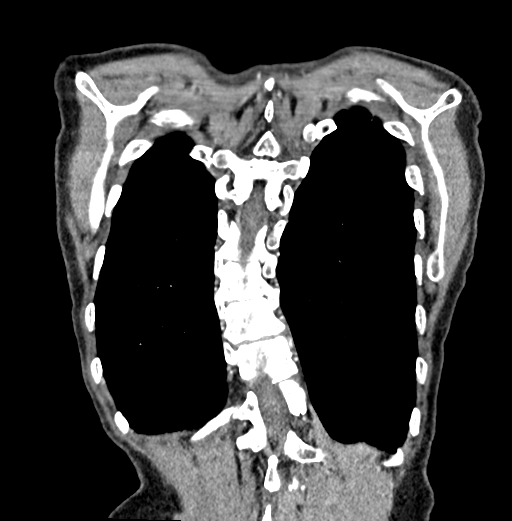
[im 101/124  lung]
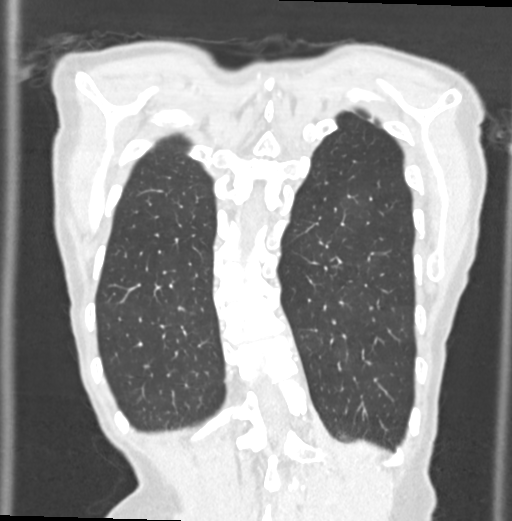
[im 110/124  lung]
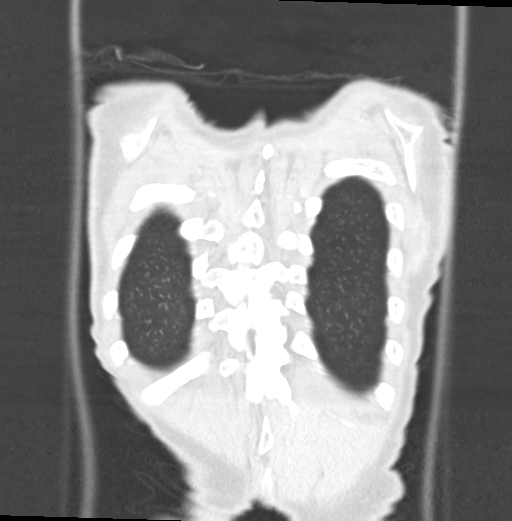
[im 119/124  lung]
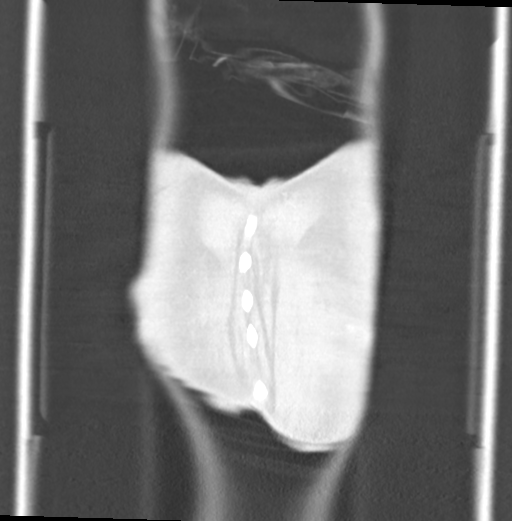

[15 of 32 positions shown; findings below may reference images not displayed]

FINDINGS: Cardiovascular: The heart size is normal. No substantial pericardial
effusion. No thoracic aortic aneurysm.

Mediastinum/Nodes: No mediastinal lymphadenopathy. There is no hilar
lymphadenopathy. The esophagus has normal imaging features. There is
no axillary lymphadenopathy.

Lungs/Pleura: Ill-defined ground-glass nodules in the right upper
lobe measure up to 6 mm. 5 mm right upper lobe nodule identified on
image 41/8. No focal airspace consolidation. No pleural effusion.

Upper Abdomen: Tiny hypodensity in the lateral segment left liver is
too small to characterize but likely benign. 5 mm nonobstructing
stone identified interpolar left kidney there is probably a small
cyst in the interpolar left kidney.

Musculoskeletal: No worrisome lytic or sclerotic osseous
abnormality.
IMPRESSION: 1. Ill-defined tiny ground-glass nodules in the right upper lobe
measure up to 6 mm. Non-contrast chest CT at 3-6 months is
recommended. If the nodules are stable at time of repeat CT, then
future CT at 18-24 months (from today's scan) is considered optional
for low-risk patients, but is recommended for high-risk patients.
This recommendation follows the consensus statement: Guidelines for
Management of Incidental Pulmonary Nodules Detected on CT Images:
2. Nonobstructing left nephrolithiasis.

## 2018-08-01 MED ORDER — IOPAMIDOL (ISOVUE-300) INJECTION 61%
75.0000 mL | Freq: Once | INTRAVENOUS | Status: AC | PRN
  Administered 2018-08-01: 75 mL via INTRAVENOUS

## 2018-08-02 ENCOUNTER — Other Ambulatory Visit: Payer: Self-pay

## 2018-08-02 DIAGNOSIS — M2550 Pain in unspecified joint: Secondary | ICD-10-CM

## 2018-08-06 ENCOUNTER — Other Ambulatory Visit (INDEPENDENT_AMBULATORY_CARE_PROVIDER_SITE_OTHER): Payer: Medicare Other

## 2018-08-06 DIAGNOSIS — M2550 Pain in unspecified joint: Secondary | ICD-10-CM

## 2018-08-06 LAB — CBC WITH DIFFERENTIAL/PLATELET
Basophils Absolute: 0 10*3/uL (ref 0.0–0.1)
Basophils Relative: 0.9 % (ref 0.0–3.0)
Eosinophils Absolute: 0.5 10*3/uL (ref 0.0–0.7)
Eosinophils Relative: 9.5 % — ABNORMAL HIGH (ref 0.0–5.0)
HCT: 45.1 % (ref 36.0–46.0)
Hemoglobin: 15.4 g/dL — ABNORMAL HIGH (ref 12.0–15.0)
Lymphocytes Relative: 30.4 % (ref 12.0–46.0)
Lymphs Abs: 1.5 10*3/uL (ref 0.7–4.0)
MCHC: 34.3 g/dL (ref 30.0–36.0)
MCV: 91.4 fl (ref 78.0–100.0)
Monocytes Absolute: 0.4 10*3/uL (ref 0.1–1.0)
Monocytes Relative: 9 % (ref 3.0–12.0)
Neutro Abs: 2.5 10*3/uL (ref 1.4–7.7)
Neutrophils Relative %: 50.2 % (ref 43.0–77.0)
Platelets: 222 10*3/uL (ref 150.0–400.0)
RBC: 4.93 Mil/uL (ref 3.87–5.11)
RDW: 13.2 % (ref 11.5–15.5)
WBC: 5 10*3/uL (ref 4.0–10.5)

## 2018-08-06 LAB — COMPREHENSIVE METABOLIC PANEL
ALT: 23 U/L (ref 0–35)
AST: 23 U/L (ref 0–37)
Albumin: 4.8 g/dL (ref 3.5–5.2)
Alkaline Phosphatase: 59 U/L (ref 39–117)
BUN: 14 mg/dL (ref 6–23)
CO2: 30 mEq/L (ref 19–32)
Calcium: 9.3 mg/dL (ref 8.4–10.5)
Chloride: 103 mEq/L (ref 96–112)
Creatinine, Ser: 0.66 mg/dL (ref 0.40–1.20)
GFR: 88.78 mL/min (ref >60.00)
Glucose, Bld: 93 mg/dL (ref 70–99)
Potassium: 3.6 mEq/L (ref 3.5–5.1)
Sodium: 141 mEq/L (ref 135–145)
Total Bilirubin: 0.9 mg/dL (ref 0.2–1.2)
Total Protein: 7.3 g/dL (ref 6.0–8.3)

## 2018-08-06 LAB — C-REACTIVE PROTEIN: CRP: <1 mg/dL (ref 0.5–20.0)

## 2018-08-06 LAB — SEDIMENTATION RATE: Sed Rate: 3 mm/hr (ref 0–30)

## 2018-08-07 ENCOUNTER — Other Ambulatory Visit: Payer: Self-pay | Admitting: Physical Therapy

## 2018-08-07 DIAGNOSIS — R079 Chest pain, unspecified: Secondary | ICD-10-CM

## 2018-08-07 LAB — CALCIUM, IONIZED: Calcium, Ion: 5.01 mg/dL (ref 4.8–5.6)

## 2018-08-07 LAB — ANGIOTENSIN CONVERTING ENZYME: Angiotensin-Converting Enzyme: <5 U/L — ABNORMAL LOW (ref 9–67)

## 2018-08-07 LAB — PTH, INTACT AND CALCIUM
Calcium: 9.7 mg/dL (ref 8.6–10.4)
PTH: 29 pg/mL (ref 14–64)

## 2018-08-19 ENCOUNTER — Encounter: Payer: Self-pay | Admitting: Pulmonary Disease

## 2018-08-19 ENCOUNTER — Other Ambulatory Visit: Payer: Self-pay

## 2018-08-19 ENCOUNTER — Ambulatory Visit (INDEPENDENT_AMBULATORY_CARE_PROVIDER_SITE_OTHER): Payer: Medicare Other | Admitting: Pulmonary Disease

## 2018-08-19 VITALS — BP 162/90 | HR 108 | Temp 98.5°F | Ht 68.0 in | Wt 121.8 lb

## 2018-08-19 DIAGNOSIS — R9389 Abnormal findings on diagnostic imaging of other specified body structures: Secondary | ICD-10-CM | POA: Diagnosis not present

## 2018-08-19 DIAGNOSIS — J4540 Moderate persistent asthma, uncomplicated: Secondary | ICD-10-CM | POA: Diagnosis not present

## 2018-08-19 DIAGNOSIS — R0602 Shortness of breath: Secondary | ICD-10-CM | POA: Diagnosis not present

## 2018-08-19 MED ORDER — LEVALBUTEROL TARTRATE 45 MCG/ACT IN AERO: 1.0000 | INHALATION_SPRAY | RESPIRATORY_TRACT | 6 refills | Status: DC | PRN

## 2018-08-19 NOTE — Patient Instructions (Addendum)
Chest tightness  Medications ordered:  Xopenex inhaler as needed for chest tightness. OK to take before activity.  Tests ordered:  Pulmonary function test as soon as next available-->>Do NOT take inhalers within 24 hours of the test.  COVID-19 antibody test  IgE  Right upper lobe lung nodules <58mm Recommend repeat CT Chest without contrast in 6 months  Follow-up in 1 month

## 2018-08-19 NOTE — Progress Notes (Signed)
Subjective:   PATIENT ID: Tina Lucas GENDER: female DOB: [birth year], MRN: [id number]   HPI  Chief Complaint  Patient presents with  . Consult    follow up from CT    Reason for Visit: New consult for chest tightness  [name] is a 68 year old female never smoker with childhood asthma (shots, nebulizers and inhalers) who presents as a new patient for concerns of COPD seen on imaging.  She was previously seen by pulmonologist (Dr. Cornelia Copa) for asthma until early adulthood.  She was advised to stop Advair at age 76 because she "outgrew" asthma and required minimal treatment.  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  Last year during the fall she was playing competitive sports and began to notice dyspnea on exertion at that time.  In December 2019 she was diagnosed with bronchitis and treated with doyxcycline however had persistent chest tightness and heartburn.  She was treated with Prilosec and Breo but continued to have symptoms.  In February 2020 underwent endoscopy which demonstrated chronic gastritis and acute gastric ulcer. She was treated with PPI BID.  She is scheduled for follow-up endoscopy however had to reschedule due to the pandemic was not able to follow-up due to the pandemic.  In that time she was concerned that her PPI may have aggravated her chest tightness never really helped with her symptoms. Repeat endoscopy on June 06/25/28 demonstrated normal EGD with resolved ulcer so Nexium was discontinued and she was started on Pepcid per her PCP.   She was then referred to Sports Medicine (Dr. Tamala Julian) for possible musculoskeletal issues. Dr. Tamala Julian obtained CXR and CT Chest. She was prescribed Asmanax and albuterol. Has had prior cardiac work-up with abnormal EKGs howeer stress test was negative.   In the last month month, she continues to have chest tightness and continues to have dyspnea on exertion that occurs throughout the  day and night.  Denies associated cough or wheezing.  Her breathing somewhat improves when she takes off her sports bra.  She does still have some symptoms when wearing a regular bra.  Exercise worsen her pain.  She still participates in acitvity but the post-activity pain has gradually worsened in intensity. She will have nocturnal awakenings due to the chest pain and will wake up hurting.  This has affected her sleep quality.  Has tried albuterol however tachycardia was difficult to tolerate with activity. She has tried asmanax x 9 days felt that it was not helping her. She does not believe her current symptoms are related to asthma. Normally her asthma is coughing, wheezing and shortness of breath.  She reports she is still having belching despite being compliant with her reflux medication.   Last PFT was 12 years ago.  ACT 10  Social History: Never smoker Retired Pharmacist, hospital MS degree in exercise physiology No exposures to COVID-19.  She lives in [place] near her grandchildren  Environmental exposures:  Significant second-hand (father) smoke exposure and noticeably improved after moving for college.   No known or significant exposures to chemicals, raw materials or metals  I have personally reviewed patient's past medical/family/social history, allergies, current medications.  Past Medical History:  Diagnosis Date  . Allergic rhinitis   . Allergy   . Asthma    as a child  . 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 COPD (chronic obstructive pulmonary disease) ([place])   .  History of asthma 1956-2000  . Hypothyroid 2013  . Osteopenia   . Primary HSV infection of mouth 2008  . Pulmonary nodule   . Sunlight-induced angio-edema-urticaria 07/11/2011   unknown trigger, neg autoimmune workup     Family History  Problem Relation Age of Onset  . Breast cancer Mother 54  . Osteoarthritis Father   . Hypertension Father   . Leukemia Maternal Aunt 28   . Atrial fibrillation Brother   . Colon cancer Neg Hx   . Esophageal cancer Neg Hx   . Inflammatory bowel disease Neg Hx   . Liver disease Neg Hx   . Pancreatic cancer Neg Hx   . Rectal cancer Neg Hx   . Stomach cancer Neg Hx      Social History   Occupational History  . Not on file  Tobacco Use  . Smoking status: Never Smoker  . Smokeless tobacco: Never Used  Substance and Sexual Activity  . Alcohol use: Yes    Alcohol/week: 0.0 standard drinks    Comment: socially  . Drug use: No  . Sexual activity: Not on file    No Known Allergies   Outpatient Medications Prior to Visit  Medication Sig Dispense Refill  . Calcium Carbonate-Vitamin D (CALTRATE 600+D) 600-400 MG-UNIT per tablet Take 1 tablet by mouth daily.    . celecoxib (CELEBREX) 100 MG capsule Take 1 capsule (100 mg total) by mouth 2 (two) times daily. (Patient taking differently: Take 100 mg by mouth daily. ) 60 capsule 12  . cholecalciferol (VITAMIN D) 400 UNITS TABS tablet Take 400 Units by mouth.    . famotidine (PEPCID) 20 MG tablet Take 1 tablet (20 mg total) by mouth daily. 30 tablet 5  . fexofenadine (ALLEGRA) 180 MG tablet Take 180 mg by mouth daily.    . fluocinonide cream (LIDEX) 6.01 % Apply 1 application topically 2 (two) times daily as needed (FOR PSORIASIS).     Marland Kitchen levothyroxine (SYNTHROID, LEVOTHROID) 50 MCG tablet TAKE ONE TABLET BY MOUTH EVERY MORNING BEFORE BREAKFAST 90 tablet 1  . LOTEMAX 0.5 % OINT     . Magnesium 250 MG TABS Take 125 mg by mouth daily.    . Multiple Vitamins-Minerals (ONE-A-DAY WOMENS 50+ ADVANTAGE PO) Take by mouth daily.    . prednisoLONE acetate (PRED FORTE) 1 % ophthalmic suspension Place 1 drop into the right eye daily. Pt takes 3 to 4 times per week    . albuterol (VENTOLIN HFA) 108 (90 Base) MCG/ACT inhaler Inhale 2 puffs into the lungs every 4 (four) hours as needed for wheezing or shortness of breath. (Patient not taking: Reported on 08/19/2018) 8 g 1  . Probiotic Product  (PROBIOTIC DAILY) CAPS Take 1 capsule by mouth daily. Pt is taking 3 to 4 times a week     No facility-administered medications prior to visit.     Review of Systems  Constitutional: Negative for chills, diaphoresis, fever, malaise/fatigue and weight loss.  HENT: Negative for congestion, ear pain and sore throat.   Respiratory: Positive for shortness of breath. Negative for cough, hemoptysis, sputum production and wheezing.   Cardiovascular: Positive for chest pain. Negative for palpitations and leg swelling.  Gastrointestinal: Positive for heartburn. Negative for abdominal pain and nausea.  Genitourinary: Negative for frequency.  Musculoskeletal: Negative for joint pain and myalgias.  Skin: Negative for itching and rash.  Neurological: Negative for dizziness, weakness and headaches.  Endo/Heme/Allergies: Does not bruise/bleed easily.  Psychiatric/Behavioral: Negative for depression. The patient is not  nervous/anxious.      Objective:   Vitals:   08/19/18 1524  BP: (!) 162/90  Pulse: (!) 108  Temp: 98.5 F (36.9 C)  TempSrc: Oral  SpO2: 97%  Weight: 121 lb 12.8 oz (55.2 kg)  Height: 5\' 8"  (1.727 m)   SpO2: 97 %  Physical Exam: General: Well-appearing, no acute distress HENT: , AT Eyes: EOMI, no scleral icterus Respiratory: Clear to auscultation bilaterally.  No crackles, wheezing or rales Cardiovascular: RRR, -M/R/G, no JVD GI: BS+, soft, nontender Extremities:-Edema,-tenderness Neuro: AAO x4, CNII-XII grossly intact Skin: Intact, no rashes or bruising Psych: Normal mood, normal affect Musculoskeletal: Mild tenderness to palpation over right upper chest, mildly exacerbated with raising arms  Data Reviewed:  Imaging: CT Chest 08/01/18- RUL GGO nodule measured 72mm and solid nodule measured 31mm  PFT: None on file  Labs: CBC 08/06/18 9.5% Eos with 500 eos. Since 2015 absolute eos range from 400-600.  Imaging, labs and tests noted above have been reviewed  independently by me.    Assessment & Plan:  Moderate persistent asthma Dyspnea on exertion Chest tightness Lung nodules <1cm  Discussion: 68 year old female never smoker with prior diagnosis of asthma who presents with chest tightness/pain and dyspnea on exertion x 8 months.  Her symptoms may be multifactorial but I am initially concerned about uncontrolled asthma. On lab review she is noted to have eosinophilia ranging from 400-600 since 2015.  She would likely benefit from ICS inhaler as there may be an allergic component to her asthma.  Her reflux may also be uncontrolled and she may need additional studies to evaluate whether she needs to return to PPI.  Will defer this until we can obtain pulmonary function tests. Will plan to start ICS inhaler if appropriate.  We reviewed chest imaging including CXR and CT chest from 7/8 and 7/9 respectively.  CXR demonstrates hyperinflation. We discussed how this finding can be seen in patients with both asthma and COPD since both are obstructive airway diseases. CT scan does not show evidence of emphysema. RUL nodules present.  Chest tightness  Medications ordered:  Xopenex inhaler as needed for chest tightness. OK to take before activity.  Tests ordered:  Pulmonary function test as soon as next available-->>Do NOT take inhalers within 24 hours of the test.  COVID-19 antibody test  IgE  Right upper lobe lung nodules <61mm Recommend repeat CT Chest without contrast in 6 months  Health Maintenance Pneumonia 11/28/2013 Prevnar, 03/29/2018 Pneumovax Influenza 10/25/2017  Orders Placed This Encounter  Procedures  . SARS-CoV-2 Antibody, IgM    Standing Status:   Future    Number of Occurrences:   1    Standing Expiration Date:   02/19/2020  . IgE    Standing Status:   Future    Number of Occurrences:   1    Standing Expiration Date:   08/19/2019  . Pulmonary function test    Standing Status:   Future    Standing Expiration Date:   08/19/2019     Order Specific Question:   Where should this test be performed?    Answer:   [facility]    Order Specific Question:   Full PFT: includes the following: basic spirometry, spirometry pre & post bronchodilator, diffusion capacity (DLCO), lung volumes    Answer:   Full PFT   Meds ordered this encounter  Medications  . levalbuterol (XOPENEX HFA) 45 MCG/ACT inhaler    Sig: Inhale 1-2 puffs into the lungs every 4 (four) hours  as needed for wheezing.    Dispense:  1 Inhaler    Refill:  6    Return in about 1 month (around 09/19/2018).  Artois, MD [facility] 08/25/2018 5:50 PM  Office Number [phone]

## 2018-08-20 ENCOUNTER — Other Ambulatory Visit: Payer: Self-pay | Admitting: Pulmonary Disease

## 2018-08-20 LAB — IGE: IgE (Immunoglobulin E), Serum: 143 kU/L — ABNORMAL HIGH (ref <=114)

## 2018-08-21 ENCOUNTER — Telehealth: Payer: Self-pay

## 2018-08-21 LAB — SARS-COV-2 ANTIBODY, IGM: SARS-CoV-2 Antibody, IgM: NEGATIVE

## 2018-08-21 NOTE — Telephone Encounter (Signed)
Received paperwork stating Levalbuterol is not covered.   Alternatives: Ventolin, Proventil, and Albuterol HFA.   JE please advise. Please route to triage. Thanks.

## 2018-08-21 NOTE — Telephone Encounter (Signed)
No further action needed. Patient already has albuterol which she does not tolerate due to tachycardia. Will call patient to when we update her on her test results. Please refer to IgE result note for further details.

## 2018-08-24 ENCOUNTER — Other Ambulatory Visit (HOSPITAL_COMMUNITY)
Admission: RE | Admit: 2018-08-24 | Discharge: 2018-08-24 | Disposition: A | Discharge status: Home / Self Care | Payer: Medicare Other | Source: Ambulatory Visit | Attending: Pulmonary Disease | Admitting: Pulmonary Disease

## 2018-08-24 DIAGNOSIS — Z20828 Contact with and (suspected) exposure to other viral communicable diseases: Secondary | ICD-10-CM | POA: Diagnosis present

## 2018-08-24 LAB — SARS CORONAVIRUS 2 (TAT 6-24 HRS): SARS Coronavirus 2: NEGATIVE

## 2018-08-27 ENCOUNTER — Ambulatory Visit: Payer: Medicare Other | Admitting: Pulmonary Disease

## 2018-08-27 ENCOUNTER — Ambulatory Visit (INDEPENDENT_AMBULATORY_CARE_PROVIDER_SITE_OTHER): Payer: Medicare Other | Admitting: Primary Care

## 2018-08-27 ENCOUNTER — Encounter: Payer: Self-pay | Admitting: Primary Care

## 2018-08-27 ENCOUNTER — Other Ambulatory Visit: Payer: Self-pay

## 2018-08-27 ENCOUNTER — Ambulatory Visit (INDEPENDENT_AMBULATORY_CARE_PROVIDER_SITE_OTHER): Payer: Medicare Other | Admitting: Pulmonary Disease

## 2018-08-27 DIAGNOSIS — R0602 Shortness of breath: Secondary | ICD-10-CM

## 2018-08-27 DIAGNOSIS — J449 Chronic obstructive pulmonary disease, unspecified: Secondary | ICD-10-CM | POA: Diagnosis not present

## 2018-08-27 DIAGNOSIS — R9389 Abnormal findings on diagnostic imaging of other specified body structures: Secondary | ICD-10-CM

## 2018-08-27 LAB — PULMONARY FUNCTION TEST
DL/VA % pred: 134 %
DL/VA: 5.4 ml/min/mmHg/L
DLCO unc % pred: 107 %
DLCO unc: 23.9 ml/min/mmHg
FEF 25-75 Post: 1.05 L/sec
FEF 25-75 Pre: 0.8 L/sec
FEF2575-%Change-Post: 31 %
FEF2575-%Pred-Post: 48 %
FEF2575-%Pred-Pre: 36 %
FEV1-%Change-Post: 7 %
FEV1-%Pred-Post: 65 %
FEV1-%Pred-Pre: 60 %
FEV1-Post: 1.76 L
FEV1-Pre: 1.63 L
FEV1FVC-%Change-Post: 2 %
FEV1FVC-%Pred-Pre: 80 %
FEV6-%Change-Post: 5 %
FEV6-%Pred-Post: 80 %
FEV6-%Pred-Pre: 76 %
FEV6-Post: 2.72 L
FEV6-Pre: 2.58 L
FEV6FVC-%Change-Post: 0 %
FEV6FVC-%Pred-Post: 102 %
FEV6FVC-%Pred-Pre: 101 %
FVC-%Change-Post: 4 %
FVC-%Pred-Post: 78 %
FVC-%Pred-Pre: 74 %
FVC-Post: 2.78 L
FVC-Pre: 2.65 L
Post FEV1/FVC ratio: 63 %
Post FEV6/FVC ratio: 98 %
Pre FEV1/FVC ratio: 62 %
Pre FEV6/FVC Ratio: 98 %
RV % pred: 175 %
RV: 4.16 L
TLC % pred: 117 %
TLC: 6.66 L

## 2018-08-27 MED ORDER — BUDESONIDE-FORMOTEROL FUMARATE 80-4.5 MCG/ACT IN AERO: 2.0000 | INHALATION_SPRAY | Freq: Two times a day (BID) | RESPIRATORY_TRACT | 0 refills | Status: DC

## 2018-08-27 MED ORDER — MONTELUKAST SODIUM 10 MG PO TABS: 10.0000 mg | ORAL_TABLET | Freq: Every day | ORAL | 11 refills | Status: DC

## 2018-08-27 NOTE — Progress Notes (Signed)
PFT done today. 

## 2018-08-27 NOTE — Patient Instructions (Addendum)
Pulmonary function testing showed evidence of obstructive asthma  Elevated eosinophils and IgE  Trial Symbicort 80 - take two puffs twice daily   Use ventolin 2 puffs every 6 hours as needed for breakthrough shortness of breath/wheezing   Singulair 10 mg at bedtime  Covid antibodies negative

## 2018-08-27 NOTE — Assessment & Plan Note (Addendum)
PFTs today showed mixed restriction and obstruction. Normal DLCO  FEV1 1.76 (65%), ratio 63 - Obstructive asthma with allergic component  - Eos 500, IgE 143 - Trial Symbicort 80 TWO puffs bid - Add Singulair 10mg  at bedtime

## 2018-08-27 NOTE — Progress Notes (Signed)
@Patient  ID: Tina Lucas, female    DOB: Sep 02, 1950, 68 y.o.   MRN: [id number]  Chief Complaint  Patient presents with   Follow-up    PFT today - SOB no better no worse     Referring provider: Binnie Rail, MD  HPI: 68 year old female, never smoked (second hand smoke for 18 years). PMH significant for childhood asthma, bronchitis, GERD, hypothyroid, osteopenia, allergic urticaria, elevated blood pressure,atypical chest pain. Patient of Dr. Loanne Drilling, initial consult on 08/19/18 for possible COPD seen on imaging. Continues to have chest tightness and dyspnea on exertion. Eosinophils 500. IgE 143. Dx with moderate persistent asthma, dyspnea on exertion, lung nodule.   Previous [facility] Encounter: 7/27/20Loanne Drilling Tina Lucas is a 68 year old female never smoker with childhood asthma (shots, nebulizers and inhalers) who presents as a new patient for concerns of COPD seen on imaging. She was previously seen by pulmonologist (Dr. Cornelia Copa) for asthma until early adulthood.  She was advised to stop Advair at age 23 because she "outgrew" asthma and required minimal treatment.  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  Last year during the fall she was playing competitive sports and began to notice dyspnea on exertion at that time.  In December 2019 she was diagnosed with bronchitis and treated with doyxcycline however had persistent chest tightness and heartburn.  She was treated with Prilosec and Breo but continued to have symptoms.  In February 2020 underwent endoscopy which demonstrated chronic gastritis and acute gastric ulcer. She was treated with PPI BID.  She is scheduled for follow-up endoscopy however had to reschedule due to the pandemic was not able to follow-up due to the pandemic.  In that time she was concerned that her PPI may have aggravated her chest tightness never really helped with her symptoms. Repeat endoscopy on June  06/25/28 demonstrated normal EGD with resolved ulcer so Nexium was discontinued and she was started on Pepcid per her PCP.   She was then referred to Sports Medicine (Dr. Tamala Julian) for possible musculoskeletal issues. Dr. Tamala Julian obtained CXR and CT Chest. She was prescribed Asmanax and albuterol. Has had prior cardiac work-up with abnormal EKGs howeer stress test was negative.  In the last month month, she continues to have chest tightness and continues to have dyspnea on exertion that occurs throughout the day and night.  Denies associated cough or wheezing.  Her breathing somewhat improves when she takes off her sports bra.  She does still have some symptoms when wearing a regular bra.  Exercise worsen her pain.  She still participates in acitvity but the post-activity pain has gradually worsened in intensity. She will have nocturnal awakenings due to the chest pain and will wake up hurting.  This has affected her sleep quality.  Has tried albuterol however tachycardia was difficult to tolerate with activity. She has tried asmanax x 9 days felt that it was not helping her. She does not believe her current symptoms are related to asthma. Normally her asthma is coughing, wheezing and shortness of breath.  She reports she is still having belching despite being compliant with her reflux medication.    08/27/2018 Presents today for PFTs, here to review results. Has another apt this month with Dr. Loanne Drilling. Shortness of breath and chest tightness are her biggest complaints. Not currently on maintenance inhaler. She took one puff of her rescue inhaler for 4 days which did help for a short  period but symptoms returned. Stopped Nexium, started Pepcid. Covid antibodies negative. Denies cough or congestion.   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     celecoxib (CELEBREX) 100 MG capsule Take 1 capsule (100 mg total) by mouth 2 (two) times daily. (Patient taking differently: Take 100 mg by mouth daily. ) 60 capsule 12   cholecalciferol (VITAMIN D) 400 UNITS TABS tablet Take 400 Units by mouth.     famotidine (PEPCID) 20 MG tablet Take 1 tablet (20 mg total) by mouth daily. 30 tablet 5   fexofenadine (ALLEGRA) 180 MG tablet Take 180 mg by mouth daily.     fluocinonide cream (LIDEX) 5.32 % Apply 1 application topically 2 (two) times daily as needed (FOR PSORIASIS).      levalbuterol (XOPENEX HFA) 45 MCG/ACT inhaler Inhale 1-2 puffs into the lungs every 4 (four) hours as needed for wheezing. 1 Inhaler 6   levothyroxine (SYNTHROID, LEVOTHROID) 50 MCG tablet TAKE ONE TABLET BY MOUTH EVERY MORNING BEFORE BREAKFAST 90 tablet 1   LOTEMAX 0.5 % OINT      Magnesium 250 MG TABS Take 125 mg by mouth daily.     Multiple Vitamins-Minerals (ONE-A-DAY WOMENS 50+ ADVANTAGE  PO) Take by mouth daily.     prednisoLONE acetate (PRED FORTE) 1 % ophthalmic suspension Place 1 drop into the right eye daily. Pt takes 3 to 4 times per week     Probiotic Product (PROBIOTIC DAILY) CAPS Take 1 capsule by mouth daily. Pt is taking 3 to 4 times a week     albuterol (VENTOLIN HFA) 108 (90 Base) MCG/ACT inhaler Inhale 2 puffs into the lungs every 4 (four) hours as needed for wheezing or shortness of breath. (Patient not taking: Reported on 08/27/2018) 8 g 1   No facility-administered medications prior to visit.     Review of Systems  Review of Systems  Constitutional: Negative.   Respiratory: Positive for chest tightness, shortness of breath and wheezing.   Psychiatric/Behavioral: The patient is nervous/anxious.    Physical Exam  BP (!) 160/86 (BP Location: Right Arm, Patient Position: Sitting, Cuff Size: Normal)    Pulse (!) 108    Temp 98 F (36.7 C)    Ht 5\' 8"  (1.727 m)    Wt 122 lb (55.3 kg)    SpO2 98%  BMI 18.55 kg/m  Physical Exam Constitutional:      Appearance: Normal appearance.  HENT:     Head: Normocephalic and atraumatic.  Neck:     Musculoskeletal: Normal range of motion and neck supple.  Cardiovascular:     Rate and Rhythm: Regular rhythm. Tachycardia present.  Pulmonary:     Effort: Pulmonary effort is normal.     Breath sounds: Normal breath sounds. No wheezing or rhonchi.  Skin:    General: Skin is warm and dry.  Neurological:     General: No focal deficit present.     Mental Status: She is alert and oriented to person, place, and time. Mental status is at baseline.  Psychiatric:        Thought Content: Thought content normal.      Lab Results:  CBC    Component Value Date/Time   WBC 5.0 08/06/2018 0746   RBC 4.93 08/06/2018 0746   HGB 15.4 (H) 08/06/2018 0746   HCT 45.1 08/06/2018 0746   PLT 222.0 08/06/2018 0746   MCV 91.4 08/06/2018 0746   MCH 31.5 04/20/2016 1309   MCHC 34.3 08/06/2018 0746   RDW 13.2 08/06/2018 0746    LYMPHSABS 1.5 08/06/2018 0746   MONOABS 0.4 08/06/2018 0746   EOSABS 0.5 08/06/2018 0746   BASOSABS 0.0 08/06/2018 0746    BMET    Component Value Date/Time   NA 141 08/06/2018 0746   K 3.6 08/06/2018 0746   CL 103 08/06/2018 0746   CO2 30 08/06/2018 0746   GLUCOSE 93 08/06/2018 0746   BUN 14 08/06/2018 0746   CREATININE 0.66 08/06/2018 0746   CALCIUM 9.7 08/06/2018 0746   CALCIUM 9.3 08/06/2018 0746   GFRNONAA >60 04/20/2016 1309   GFRAA >60 04/20/2016 1309    BNP No results found for: BNP  ProBNP No results found for: PROBNP  Imaging: Dg Chest 2 View  Result Date: 07/31/2018 CLINICAL DATA:  Chest pain EXAM: CHEST - 2 VIEW COMPARISON:  03/12/2018 FINDINGS: Cardiac shadows within normal limits. Mild scoliosis of the thoracolumbar spine is again noted. Lungs are hyperaerated bilaterally. No focal infiltrate or sizable effusion is seen. IMPRESSION: COPD without acute abnormality. Electronically Signed   By: Inez Catalina M.D.   On: 07/31/2018 20:15   Ct Chest W Contrast  Result Date: 08/01/2018 CLINICAL DATA:  Abnormal chest x-ray.Shortness of breath with exertion. EXAM: CT CHEST WITH CONTRAST TECHNIQUE: Multidetector CT imaging of the chest was performed during intravenous contrast administration. CONTRAST:  48mL ISOVUE-300 IOPAMIDOL (ISOVUE-300) INJECTION 61% COMPARISON:  Chest x-ray 07/31/2018.  Chest x-ray 03/12/2018. FINDINGS: Cardiovascular: The heart size is normal. No substantial pericardial effusion. No thoracic aortic aneurysm. Mediastinum/Nodes: No mediastinal lymphadenopathy. There is no hilar lymphadenopathy. The esophagus has normal imaging features. There is no axillary lymphadenopathy. Lungs/Pleura: Ill-defined ground-glass nodules in the right upper lobe measure up to 6 mm. 5 mm right upper lobe nodule identified on image 41/8. No focal airspace consolidation. No pleural effusion. Upper Abdomen: Tiny hypodensity in the lateral segment left liver is too small to  characterize but likely benign. 5 mm nonobstructing stone identified interpolar left kidney there is probably a small cyst in the interpolar left kidney. Musculoskeletal: No worrisome lytic or sclerotic osseous abnormality. IMPRESSION: 1. Ill-defined tiny ground-glass nodules in the right upper lobe measure up to 6 mm. Non-contrast chest CT at 3-6 months is recommended. If the nodules are stable at time of repeat CT, then future CT at 18-24 months (from today's scan) is  considered optional for low-risk patients, but is recommended for high-risk patients. This recommendation follows the consensus statement: Guidelines for Management of Incidental Pulmonary Nodules Detected on CT Images: From the Fleischner Society 2017; Radiology 2017; 284:228-243. 2. Nonobstructing left nephrolithiasis. Electronically Signed   By: Misty Stanley M.D.   On: 08/01/2018 14:35     Assessment & Plan:   Chronic obstructive asthma ([place]) PFTs today showed mixed restriction and obstruction. Normal DLCO  FEV1 1.76 (65%), ratio 63 - Obstructive asthma with allergic component  - Eos 500, IgE 143 - Trial Symbicort 80 TWO puffs bid - Add Singulair 10mg  at bedtime      Martyn Ehrich, NP 08/27/2018

## 2018-09-07 ENCOUNTER — Other Ambulatory Visit: Payer: Self-pay | Admitting: Internal Medicine

## 2018-09-10 ENCOUNTER — Telehealth: Payer: Self-pay

## 2018-09-10 MED ORDER — BUDESONIDE-FORMOTEROL FUMARATE 80-4.5 MCG/ACT IN AERO: 2.0000 | INHALATION_SPRAY | Freq: Two times a day (BID) | RESPIRATORY_TRACT | 0 refills | Status: DC

## 2018-09-10 NOTE — Telephone Encounter (Signed)
Pt walked in to office, was given a symbicort sample at last OV to try until her upcoming visit next week with Dr. Loanne Drilling.  Pt has run out of sample, requesting another sample to last her until her ROV.  Declined a rx, stating she wishes to speak with Dr Loanne Drilling at [facility] before purchasing the rx.  1 inhaler was given to patient.  Nothing further needed at this time- will close encounter.

## 2018-09-16 ENCOUNTER — Ambulatory Visit (INDEPENDENT_AMBULATORY_CARE_PROVIDER_SITE_OTHER): Payer: Medicare Other | Admitting: Pulmonary Disease

## 2018-09-16 ENCOUNTER — Other Ambulatory Visit: Payer: Self-pay

## 2018-09-16 ENCOUNTER — Encounter: Payer: Self-pay | Admitting: Pulmonary Disease

## 2018-09-16 VITALS — BP 180/80 | HR 63 | Temp 97.6°F | Ht 68.0 in | Wt 123.6 lb

## 2018-09-16 DIAGNOSIS — R0602 Shortness of breath: Secondary | ICD-10-CM

## 2018-09-16 DIAGNOSIS — J449 Chronic obstructive pulmonary disease, unspecified: Secondary | ICD-10-CM

## 2018-09-16 MED ORDER — MOMETASONE FURO-FORMOTEROL FUM 200-5 MCG/ACT IN AERO: 2.0000 | INHALATION_SPRAY | Freq: Two times a day (BID) | RESPIRATORY_TRACT | 0 refills | Status: DC

## 2018-09-16 MED ORDER — MOMETASONE FURO-FORMOTEROL FUM 200-5 MCG/ACT IN AERO: 2.0000 | INHALATION_SPRAY | Freq: Two times a day (BID) | RESPIRATORY_TRACT | 6 refills | Status: DC

## 2018-09-16 NOTE — Progress Notes (Signed)
Subjective:   PATIENT ID: Tina Lucas GENDER: female DOB: 12-30-1950, MRN: [id number]   HPI  Chief Complaint  Patient presents with  . Follow-up    Reason for Visit: Follow-up  [name] is a 68 year old female never smoker with childhood asthma (shots, nebulizers and inhalers) who presents for follow-up.  Interval After follow-up with PCCM NP, she was started on low-dose ICS/LABA after her PFTs demonstrated obstructive defect. She has tolerated Symbicort 80 well. She stopped Singulair after 4-5 days due to headaches. She still has shortness of breath and chest tightness with exertion. Her activity level is decreased compared to March due to her symptoms. She recently played last week and did ok but still has chest tightness/burning that is more noticeable after the activity. She has switched to a more comfortable bra instead of a sports bra due to tightness. If she plays pickleball, she may not have the energy to run errands later on in the day. She does report improved chest tightness after starting bronchodilators. Has not used the Xopenex. Even with talking, she has mild chest discomfort. Activity, talking and mask wearing worsens her symptoms. Rest and bronchodilators improve her symptoms by 50%.    Social History: Never smoker Retired Pharmacist, hospital MS degree in exercise physiology No exposures to COVID-19.  She lives in [place] near her grandchildren  Environmental exposures:  Significant second-hand (father) smoke exposure and noticeably improved after moving for college.   No known or significant exposures to chemicals, raw materials or metals  I have personally reviewed patient's past medical/family/social history/allergies/current medications.  Past Medical History:  Diagnosis Date  . Allergic rhinitis   . Allergy   . Asthma    as a child  . 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 COPD (chronic obstructive pulmonary  disease) ([place])   . History of asthma 1956-2000  . Hypothyroid 2013  . Osteopenia   . Primary HSV infection of mouth 2008  . Pulmonary nodule   . Sunlight-induced angio-edema-urticaria 07/11/2011   unknown trigger, neg autoimmune workup     Family History  Problem Relation Age of Onset  . Breast cancer Mother 33  . Osteoarthritis Father   . Hypertension Father   . Leukemia Maternal Aunt 39  . Atrial fibrillation Brother   . Colon cancer Neg Hx   . Esophageal cancer Neg Hx   . Inflammatory bowel disease Neg Hx   . Liver disease Neg Hx   . Pancreatic cancer Neg Hx   . Rectal cancer Neg Hx   . Stomach cancer Neg Hx      Social History   Occupational History  . Not on file  Tobacco Use  . Smoking status: Never Smoker  . Smokeless tobacco: Never Used  Substance and Sexual Activity  . Alcohol use: Yes    Alcohol/week: 0.0 standard drinks    Comment: socially  . Drug use: No  . Sexual activity: Not on file    No Known Allergies   Outpatient Medications Prior to Visit  Medication Sig Dispense Refill  . budesonide-formoterol (SYMBICORT) 80-4.5 MCG/ACT inhaler Inhale 2 puffs into the lungs 2 (two) times daily. 1 Inhaler 0  . Calcium Carbonate-Vitamin D (CALTRATE 600+D) 600-400 MG-UNIT per tablet Take 1 tablet by mouth daily.    . celecoxib (CELEBREX) 100 MG capsule Take 1 capsule (100 mg total) by mouth 2 (two) times daily. (Patient taking differently: Take 100 mg by mouth daily. )  60 capsule 12  . cholecalciferol (VITAMIN D) 400 UNITS TABS tablet Take 400 Units by mouth.    . famotidine (PEPCID) 20 MG tablet Take 1 tablet (20 mg total) by mouth daily. 30 tablet 5  . fexofenadine (ALLEGRA) 180 MG tablet Take 180 mg by mouth daily.    . fluocinonide cream (LIDEX) [id number] % Apply 1 application topically 2 (two) times daily as needed (FOR PSORIASIS).     Marland Kitchen levalbuterol (XOPENEX HFA) 45 MCG/ACT inhaler Inhale 1-2 puffs into the lungs every 4 (four) hours as needed for wheezing. 1  Inhaler 6  . levothyroxine (SYNTHROID) 50 MCG tablet TAKE ONE TABLET BY MOUTH EVERY MORNING BEFORE BREAKFAST 90 tablet 0  . LOTEMAX 0.5 % OINT     . Magnesium 250 MG TABS Take 125 mg by mouth daily.    . Multiple Vitamins-Minerals (ONE-A-DAY WOMENS 50+ ADVANTAGE PO) Take by mouth daily.    . prednisoLONE acetate (PRED FORTE) 1 % ophthalmic suspension Place 1 drop into the right eye daily. Pt takes 3 to 4 times per week    . Probiotic Product (PROBIOTIC DAILY) CAPS Take 1 capsule by mouth daily. Pt is taking 3 to 4 times a week    . montelukast (SINGULAIR) 10 MG tablet Take 1 tablet (10 mg total) by mouth at bedtime. (Patient not taking: Reported on 09/16/2018) 30 tablet 11  . albuterol (VENTOLIN HFA) 108 (90 Base) MCG/ACT inhaler Inhale 2 puffs into the lungs every 4 (four) hours as needed for wheezing or shortness of breath. (Patient not taking: Reported on 08/27/2018) 8 g 1  . budesonide-formoterol (SYMBICORT) 80-4.5 MCG/ACT inhaler Inhale 2 puffs into the lungs 2 (two) times daily for 1 day. 1 Inhaler 0   No facility-administered medications prior to visit.     Review of Systems  Constitutional: Negative for chills, diaphoresis, fever, malaise/fatigue and weight loss.  HENT: Negative for congestion, ear pain and sore throat.   Respiratory: Positive for shortness of breath. Negative for cough, hemoptysis, sputum production and wheezing.   Cardiovascular: Positive for chest pain. Negative for palpitations and leg swelling.  Gastrointestinal: Negative for abdominal pain, heartburn and nausea.  Genitourinary: Negative for frequency.  Musculoskeletal: Positive for myalgias. Negative for joint pain.  Skin: Negative for itching and rash.  Neurological: Negative for dizziness, weakness and headaches.  Endo/Heme/Allergies: Does not bruise/bleed easily.  Psychiatric/Behavioral: Negative for depression. The patient is not nervous/anxious.      Objective:   Vitals:   09/16/18 0939 09/16/18 0940   BP:  (!) 180/80  Pulse:  63  Temp: 97.6 F (36.4 C)   TempSrc: Oral   SpO2:  100%  Weight: 123 lb 9.6 oz (56.1 kg)   Height: 5\' 8"  (1.727 m)    SpO2: 100 % O2 Device: None (Room air)  Physical Exam: General: Well-appearing, no acute distress HENT: [place], AT Eyes: EOMI, no scleral icterus Respiratory: Clear to auscultation bilaterally.  No crackles, wheezing or rales Cardiovascular: RRR, -M/R/G, no JVD GI: BS+, soft, nontender Extremities:-Edema,-tenderness Neuro: AAO x4, CNII-XII grossly intact Skin: Intact, no rashes or bruising Psych: Normal mood, normal affect  Data Reviewed:  Imaging: CT Chest 08/01/18- RUL GGO nodule measured 55mm and solid nodule measured 102mm  PFT: 08/27/2018 FVC 2.78 (78 %) FEV1 1.76 (65 %) Ratio 63 TLC 117 % RV 175% RV/TLC 144% DLCO 107 % Interpretation: Moderate obstructive defect with air trapping.  No significant bronchodilator response.  Normal TLC.  Normal DLCO  Labs: CBC 08/06/18 9.5% Eos  with 500 eos. Since 2015 absolute eos range from 400-600.    Component Value Date/Time   WBC 5.0 08/06/2018 0746   RBC 4.93 08/06/2018 0746   HGB 15.4 (H) 08/06/2018 0746   HCT 45.1 08/06/2018 0746   PLT 222.0 08/06/2018 0746   MCV 91.4 08/06/2018 0746   MCH 31.5 04/20/2016 1309   MCHC 34.3 08/06/2018 0746   RDW 13.2 08/06/2018 0746   LYMPHSABS 1.5 08/06/2018 0746   MONOABS 0.4 08/06/2018 0746   EOSABS 0.5 08/06/2018 0746   BASOSABS 0.0 08/06/2018 0746   BMET    Component Value Date/Time   NA 141 08/06/2018 0746   K 3.6 08/06/2018 0746   CL 103 08/06/2018 0746   CO2 30 08/06/2018 0746   GLUCOSE 93 08/06/2018 0746   BUN 14 08/06/2018 0746   CREATININE 0.66 08/06/2018 0746   CALCIUM 9.7 08/06/2018 0746   CALCIUM 9.3 08/06/2018 0746   GFRNONAA >60 04/20/2016 1309   GFRAA >60 04/20/2016 1309   Imaging, labs and test noted above have been reviewed independently by me.    Assessment & Plan:   Discussion: 68 year old female never smoker with  history of asthma who initially presented with 43-month history of chest tightness and dyspnea on exertion.  Recent PFTs demonstrate moderate obstructive defect without reversibility with air trapping.  Improvement on ICS/LABA inhaler however symptoms remain persistent.  Will increase ICS level.  On lab review she is noted to have eosinophilia ranging from 400-600 since 2015.  If her symptoms persist despite optimized inhaler use, would consider a biologic for asthmatic component of her respiratory issues.  Diagnosis would be consistent with COPD and asthma overlap syndrome.  Will obtain alpha-1 antitrypsin level to complete work-up of COPD.  Moderate COPD/Asthma Overlap Syndrome-uncontrolled  Medications ordered:  Increase ICS dose with Dulera 200 two puffs twice a day. THIS IS YOUR EVERY DAY INHALER  Xopenex inhaler as needed for chest tightness. OK to take before activity.  Tests ordered:  Alpha-1 antitrypsin level  RAST panel  Right upper lobe lung nodules <21mm Follow-up CT Chest without contrast in January 2021  Health Maintenance Pneumonia 11/28/2013 Prevnar, 03/29/2018 Pneumovax Influenza 10/25/2017  Orders Placed This Encounter  Procedures  . CT Chest Wo Contrast    Standing Status:   Future    Standing Expiration Date:   11/16/2019    Scheduling Instructions:     6 months from July scan... approx [id number]    Order Specific Question:   Preferred imaging location?    Answer:   [facility]    Order Specific Question:   Radiology Contrast Protocol - do NOT remove file path    Answer:   [email or web address]  . Alpha-1 antitrypsin phenotype    Standing Status:   Future    Number of Occurrences:   1    Standing Expiration Date:   09/16/2019  . Resp Allergy Profile Regn2DC DE MD [facility]    Standing Status:   Future    Standing Expiration Date:   03/18/2020   Meds ordered this encounter  Medications  . mometasone-formoterol (DULERA) 200-5 MCG/ACT AERO     Sig: Inhale 2 puffs into the lungs 2 (two) times daily.    Dispense:  8.8 g    Refill:  6  . mometasone-formoterol (DULERA) 200-5 MCG/ACT AERO    Sig: Inhale 2 puffs into the lungs 2 (two) times daily.    Dispense:  8.8 g    Refill:  0  Order Specific Question:   Lot Number?    Answer:   [id number]    Order Specific Question:   Expiration Date?    Answer:   08/07/2019    Order Specific Question:   Manufacturer?    Answer:   [employer] [[id number]]    Order Specific Question:   Quantity    Answer:   1    Return in about 2 months (around 11/16/2018).  [place], MD [facility] 09/16/2018 11:39 AM  Office Number [phone]

## 2018-09-16 NOTE — Patient Instructions (Addendum)
Moderate COPD/Asthma Overlap Syndrome  Medications ordered:  Increase ICS dose with Dulera 200 two puffs twice a day. THIS IS YOUR EVERY DAY INHALER  Xopenex inhaler as needed for chest tightness. OK to take before activity.  Tests ordered:  Alpha-1 antitrypsin level  RAST panel  Right upper lobe lung nodules <48mm Follow-up CT Chest without contrast in January 2021   Follow-up with me in 10/2018

## 2018-09-20 LAB — ALPHA-1 ANTITRYPSIN PHENOTYPE: A-1 Antitrypsin, Ser: 78 mg/dL — ABNORMAL LOW (ref 83–199)

## 2018-10-08 NOTE — Progress Notes (Signed)
Subjective:    Patient ID: Tina Lucas, female    DOB: [birth year], 68 y.o.   MRN: [id number]  HPI The patient is here for an acute visit.   Chronic obstructive asthma, possible COPD:    She is following with pulmonary.  She is using the dulera and xopenex prn.  The dulera is more helpful than symbicort, but still not enough.  She still feels SOB.    GERD:  She is taking pepcid 20 mg daily.  This does help, but she burps often and has a burning feeling in her chest.  She is not sure if the burning feeling is GERD or not.   Burning sensation in chest:  She is unsure if this is musculoskeletal, related to the COPD or GERD.  If she burps a lot the burning is worse.  If she takes deep breaths or with certain movements she feels the burning.       Medications and allergies reviewed with patient and updated if appropriate.  Patient Active Problem List   Diagnosis Date Noted  . Chest wall discomfort 07/22/2018  . Pyrosis 04/26/2018  . Positive colorectal cancer screening using Cologuard test 04/26/2018  . Gastric ulcer without hemorrhage or perforation 04/26/2018  . Abnormal findings on esophagogastroduodenoscopy (EGD) 04/26/2018  . History of bronchitis 03/12/2018  . Atypical chest pain 02/26/2018  . Asthma-COPD overlap syndrome ([place]) 01/31/2018  . Chronic neck pain 08/27/2017  . GERD (gastroesophageal reflux disease) 04/20/2016  . Numbness 08/06/2015  . Osteopenia 12/10/2014  . Allergic urticaria 12/10/2014  . Pressure in head 04/01/2014  . Dizziness 02/10/2014  . Elevated blood pressure reading 11/06/2013  . Hypothyroid   . Primary HSV infection of mouth     Current Outpatient Medications on File Prior to Visit  Medication Sig Dispense Refill  . Calcium Carbonate-Vitamin D (CALTRATE 600+D) 600-400 MG-UNIT per tablet Take 1 tablet by mouth daily.    . celecoxib (CELEBREX) 100 MG capsule Take 1 capsule (100 mg total) by mouth 2 (two) times daily. (Patient taking differently:  Take 100 mg by mouth daily. ) 60 capsule 12  . cholecalciferol (VITAMIN D) 400 UNITS TABS tablet Take 400 Units by mouth.    . fexofenadine (ALLEGRA) 180 MG tablet Take 180 mg by mouth daily.    . fluocinonide cream (LIDEX) AB-123456789 % Apply 1 application topically 2 (two) times daily as needed (FOR PSORIASIS).     Marland Kitchen levalbuterol (XOPENEX HFA) 45 MCG/ACT inhaler Inhale 1-2 puffs into the lungs every 4 (four) hours as needed for wheezing. 1 Inhaler 6  . levothyroxine (SYNTHROID) 50 MCG tablet TAKE ONE TABLET BY MOUTH EVERY MORNING BEFORE BREAKFAST 90 tablet 0  . LOTEMAX 0.5 % OINT     . Magnesium 250 MG TABS Take 125 mg by mouth daily.    . mometasone-formoterol (DULERA) 200-5 MCG/ACT AERO Inhale 2 puffs into the lungs 2 (two) times daily. 8.8 g 0  . Multiple Vitamins-Minerals (ONE-A-DAY WOMENS 50+ ADVANTAGE PO) Take by mouth daily.    . prednisoLONE acetate (PRED FORTE) 1 % ophthalmic suspension Place 1 drop into the right eye daily. Pt takes 3 to 4 times per week    . Probiotic Product (PROBIOTIC DAILY) CAPS Take 1 capsule by mouth daily. Pt is taking 3 to 4 times a week     No current facility-administered medications on file prior to visit.     Past Medical History:  Diagnosis Date  . Allergic rhinitis   . Allergy   .  Asthma    as a child  . 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 COPD (chronic obstructive pulmonary disease) ([place])   . History of asthma 1956-2000  . Hypothyroid 2013  . Osteopenia   . Primary HSV infection of mouth 2008  . Pulmonary nodule   . 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 COLONOSCOPY    . polyp on vocal chord     . TONSILLECTOMY  1958    Social History   Socioeconomic History  . Marital status: Married    Spouse name: Not on file  . Number of children: Not on file  . Years of education: Not on file  .  Highest education level: Not on file  Occupational History  . Not on file  Social Needs  . Financial resource strain: Not on file  . Food insecurity    Worry: Not on file    Inability: Not on file  . Transportation needs    Medical: Not on file    Non-medical: Not on file  Tobacco Use  . Smoking status: Never Smoker  . Smokeless tobacco: Never Used  Substance and Sexual Activity  . Alcohol use: Yes    Alcohol/week: 0.0 standard drinks    Comment: socially  . Drug use: No  . Sexual activity: Not on file  Lifestyle  . Physical activity    Days per week: Not on file    Minutes per session: Not on file  . Stress: Not on file  Relationships  . Social Herbalist on phone: Not on file    Gets together: Not on file    Attends religious service: Not on file    Active member of club or organization: Not on file    Attends meetings of clubs or organizations: Not on file    Relationship status: Not on file  Other Topics Concern  . Not on file  Social History Narrative   Lives with husband in a one story home.  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    Family History  Problem Relation Age of Onset  . Breast cancer Mother 43  . Osteoarthritis Father   . Hypertension Father   . Leukemia Maternal Aunt 110  . Atrial fibrillation Brother   . Colon cancer Neg Hx   . Esophageal cancer Neg Hx   . Inflammatory bowel disease Neg Hx   . Liver disease Neg Hx   . Pancreatic cancer Neg Hx   . Rectal cancer Neg Hx   . Stomach cancer Neg Hx     Review of Systems  Constitutional: Negative for fever.  Respiratory: Positive for shortness of breath. Negative for cough and wheezing.   Cardiovascular: Positive for chest pain (discomfort - burning). Negative for palpitations and leg swelling.  Gastrointestinal: Negative for abdominal pain.  Neurological: Negative for light-headedness and headaches.       Objective:   Vitals:   10/09/18 1305  BP: (!) 174/82   Pulse: 75  Resp: 20  Temp: 98.1 F (36.7 C)  SpO2: 98%   BP Readings from Last 3 Encounters:  10/09/18 (!) 174/82  09/16/18 (!) 180/80  08/27/18 (!) 160/86   Wt Readings from Last 3 Encounters:  10/09/18 121 lb (54.9 kg)  09/16/18 123 lb 9.6 oz (56.1 kg)  08/27/18 122 lb (55.3 kg)  Body mass index is 18.4 kg/m.   Physical Exam    Constitutional: Appears well-developed and well-nourished. No distress.  HENT:  Head: Normocephalic and atraumatic.  Neck: Neck supple. No tracheal deviation present. No thyromegaly present.  No cervical lymphadenopathy Cardiovascular: Normal rate, regular rhythm and normal heart sounds.  No murmur heard.   No edema Pulmonary/Chest: Effort normal and breath sounds normal. No respiratory distress. No has no wheezes. No rales.  Skin: Skin is warm and dry. Not diaphoretic.  Psychiatric: Normal mood and affect. Behavior is normal.       Assessment & Plan:    See Problem List for Assessment and Plan of chronic medical problems.

## 2018-10-09 ENCOUNTER — Encounter: Payer: Self-pay | Admitting: Internal Medicine

## 2018-10-09 ENCOUNTER — Ambulatory Visit (INDEPENDENT_AMBULATORY_CARE_PROVIDER_SITE_OTHER): Payer: Medicare Other | Admitting: Internal Medicine

## 2018-10-09 ENCOUNTER — Other Ambulatory Visit: Payer: Self-pay

## 2018-10-09 VITALS — BP 174/82 | HR 75 | Temp 98.1°F | Resp 20 | Ht 68.0 in | Wt 121.0 lb

## 2018-10-09 DIAGNOSIS — K219 Gastro-esophageal reflux disease without esophagitis: Secondary | ICD-10-CM | POA: Diagnosis not present

## 2018-10-09 DIAGNOSIS — J449 Chronic obstructive pulmonary disease, unspecified: Secondary | ICD-10-CM | POA: Diagnosis not present

## 2018-10-09 DIAGNOSIS — Z23 Encounter for immunization: Secondary | ICD-10-CM

## 2018-10-09 DIAGNOSIS — R0789 Other chest pain: Secondary | ICD-10-CM

## 2018-10-09 MED ORDER — FAMOTIDINE 40 MG PO TABS: 40.0000 mg | ORAL_TABLET | Freq: Every day | ORAL | 1 refills | Status: DC

## 2018-10-09 NOTE — Patient Instructions (Addendum)
Start Pepcid 40 mg daily.    Update me with your heartburn symptoms and burning symptom.

## 2018-10-09 NOTE — Assessment & Plan Note (Signed)
Burning sensation in chest Has gerd compenent and MSK component - likely both are contributing Will try to get GERD better controlled Will consider msk treatment Does not want to take any other medication ideally

## 2018-10-09 NOTE — Assessment & Plan Note (Signed)
Following with pulmonary On dulera and xopenex prn dulera more effective than symbicort, but symptoms not controlled No cough, wheeze, just SOB Concerned about diagnosis and how to improve symptoms

## 2018-10-09 NOTE — Assessment & Plan Note (Signed)
freq burping and burning in chest ( likely multifactorial) Uncontrolled Increase pepcid to 40 mg daily

## 2018-10-11 ENCOUNTER — Encounter: Payer: Self-pay | Admitting: Internal Medicine

## 2018-10-15 ENCOUNTER — Encounter: Payer: Self-pay | Admitting: Internal Medicine

## 2018-10-15 MED ORDER — FAMOTIDINE 40 MG PO TABS: 40.0000 mg | ORAL_TABLET | Freq: Every day | ORAL | 1 refills | Status: DC

## 2018-10-16 ENCOUNTER — Other Ambulatory Visit: Payer: Self-pay

## 2018-10-16 MED ORDER — FAMOTIDINE 10 MG PO TABS: 10.0000 mg | ORAL_TABLET | Freq: Two times a day (BID) | ORAL | 1 refills | Status: DC

## 2018-10-30 ENCOUNTER — Ambulatory Visit: Payer: Medicare Other | Admitting: Podiatry

## 2018-11-04 ENCOUNTER — Ambulatory Visit: Payer: Medicare Other | Admitting: Pulmonary Disease

## 2018-11-19 ENCOUNTER — Other Ambulatory Visit: Payer: Self-pay

## 2018-11-19 ENCOUNTER — Ambulatory Visit (INDEPENDENT_AMBULATORY_CARE_PROVIDER_SITE_OTHER): Payer: Medicare Other | Admitting: Pulmonary Disease

## 2018-11-19 ENCOUNTER — Encounter: Payer: Self-pay | Admitting: Pulmonary Disease

## 2018-11-19 VITALS — BP 170/74 | HR 89 | Temp 97.3°F | Ht 68.0 in | Wt 121.8 lb

## 2018-11-19 DIAGNOSIS — J449 Chronic obstructive pulmonary disease, unspecified: Secondary | ICD-10-CM | POA: Diagnosis not present

## 2018-11-19 DIAGNOSIS — R911 Solitary pulmonary nodule: Secondary | ICD-10-CM | POA: Diagnosis not present

## 2018-11-19 NOTE — Patient Instructions (Addendum)
Moderate COPD/Asthma Overlap Syndrome-uncontrolled CONTINUE Dulera 200 two puffs twice a day. THIS IS YOUR EVERY DAY INHALER CONTINUE Xopenex inhaler as needed for chest tightness. OK to take before activity. She does not tolerate singulair  Right upper lobe lung nodules <103mm Follow-up CT Chest without contrast in January 2021  Today we discussed 3 possible medications to add to your COPD/Asthma management. We briefly discussed the benefits and the side effects of each medication. Please let me know if you are interested in starting any of these medications. Fasenra Dupixent Nucala  Follow-up with me after CT Chest in January

## 2018-11-19 NOTE — Progress Notes (Signed)
Subjective:   PATIENT ID: Tina Lucas GENDER: female DOB: 03-Apr-1950, MRN: [id number]   HPI  Chief Complaint  Patient presents with  . Follow-up    short of breath, chest pressure, only Sunday 10/25 today better 10/27 still a little winded    Reason for Visit: Follow-up  [name] is a 68 year old female never smoker with childhood asthma (shots, nebulizers and inhalers) who presents for follow-up.  She feels the [facility] -  has helped. She has tolerated the Xopenex. She is less active due to the weather and is not able to participate in the gym due to concerns related to the pandemic. Last Sunday she had some mild aching chest pain/tightness after doing some housework while wearing her mask. She describes the pain as uncomfortable but resolved the following day and able to play pickleball without issues, and she is not wearing a mask when exercising. She has also noticed that she is more winded when talking lately and is concerned that this is related to mask. She has not had any more chest issues. Her xopenex is taken before activity and helps with her symptoms. When she is not exercising, she will use her xopenex every other day. Her symptoms are triggered by dust and feathers. She will have facial rashes at times due to her allergies.  Social History: Never smoker Retired Pharmacist, hospital MS degree in exercise physiology No exposures to COVID-19.  She lives in [place] near her grandchildren  Environmental exposures:  Significant second-hand (father) smoke exposure and noticeably improved after moving for college.   No known or significant exposures to chemicals, raw materials or metals  I have personally reviewed patient's past medical/family/social history/allergies/current medications.  Past Medical History:  Diagnosis Date  . Allergic rhinitis   . Allergy   . Asthma    as a child  . 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 COPD  (chronic obstructive pulmonary disease) ([place])   . History of asthma 1956-2000  . Hypothyroid 2013  . Osteopenia   . Primary HSV infection of mouth 2008  . Pulmonary nodule   . Sunlight-induced angio-edema-urticaria 07/11/2011   unknown trigger, neg autoimmune workup     Family History  Problem Relation Age of Onset  . Breast cancer Mother 26  . Osteoarthritis Father   . Hypertension Father   . Leukemia Maternal Aunt 81  . Atrial fibrillation Brother   . Colon cancer Neg Hx   . Esophageal cancer Neg Hx   . Inflammatory bowel disease Neg Hx   . Liver disease Neg Hx   . Pancreatic cancer Neg Hx   . Rectal cancer Neg Hx   . Stomach cancer Neg Hx      Social History   Occupational History  . Not on file  Tobacco Use  . Smoking status: Never Smoker  . Smokeless tobacco: Never Used  Substance and Sexual Activity  . Alcohol use: Yes    Alcohol/week: 0.0 standard drinks    Comment: socially  . Drug use: No  . Sexual activity: Not on file    Allergies  Allergen Reactions  . Singulair [Montelukast Sodium] Other (See Comments)    headaches     Outpatient Medications Prior to Visit  Medication Sig Dispense Refill  . Calcium Carbonate-Vitamin D (CALTRATE 600+D) 600-400 MG-UNIT per tablet Take 1 tablet by mouth daily.    . celecoxib (CELEBREX) 100 MG capsule Take 1 capsule (100 mg total) by mouth  2 (two) times daily. (Patient taking differently: Take 100 mg by mouth daily. ) 60 capsule 12  . cholecalciferol (VITAMIN D) 400 UNITS TABS tablet Take 400 Units by mouth.    . famotidine (PEPCID) 10 MG tablet Take 1 tablet (10 mg total) by mouth 2 (two) times daily. 180 tablet 1  . fexofenadine (ALLEGRA) 180 MG tablet Take 180 mg by mouth daily.    . fluocinonide cream (LIDEX) AB-123456789 % Apply 1 application topically 2 (two) times daily as needed (FOR PSORIASIS).     Marland Kitchen levalbuterol (XOPENEX HFA) 45 MCG/ACT inhaler Inhale 1-2 puffs into the lungs every 4 (four) hours as needed for  wheezing. 1 Inhaler 6  . levothyroxine (SYNTHROID) 50 MCG tablet TAKE ONE TABLET BY MOUTH EVERY MORNING BEFORE BREAKFAST 90 tablet 0  . LOTEMAX 0.5 % OINT     . Magnesium 250 MG TABS Take 125 mg by mouth daily.    . mometasone-formoterol (DULERA) 200-5 MCG/ACT AERO Inhale 2 puffs into the lungs 2 (two) times daily. 8.8 g 0  . Multiple Vitamins-Minerals (ONE-A-DAY WOMENS 50+ ADVANTAGE PO) Take by mouth daily.    . prednisoLONE acetate (PRED FORTE) 1 % ophthalmic suspension Place 1 drop into the right eye daily. Pt takes 3 to 4 times per week    . Probiotic Product (PROBIOTIC DAILY) CAPS Take 1 capsule by mouth daily. Pt is taking 3 to 4 times a week     No facility-administered medications prior to visit.     Review of Systems  Constitutional: Negative for chills, diaphoresis, fever, malaise/fatigue and weight loss.  HENT: Negative for congestion.   Respiratory: Positive for shortness of breath. Negative for cough, hemoptysis, sputum production and wheezing.   Cardiovascular: Positive for chest pain. Negative for palpitations and leg swelling.     Objective:   Vitals:   11/19/18 1031  BP: (!) 170/74  Pulse: 89  Temp: (!) 97.3 F (36.3 C)  TempSrc: Temporal  SpO2: 97%  Weight: 121 lb 12.8 oz (55.2 kg)  Height: 5\' 8"  (1.727 m)   SpO2: 97 % O2 Device: None (Room air)  Physical Exam: General: Well-appearing, no acute distress HENT: [place], AT Eyes: EOMI, no scleral icterus Respiratory: Clear to auscultation bilaterally.  No crackles, wheezing or rales Cardiovascular: RRR, -M/R/G, no JVD GI: BS+, soft, nontender Extremities:-Edema,-tenderness Neuro: AAO x4, CNII-XII grossly intact Skin: Intact, no rashes or bruising Psych: Normal mood, normal affect  Data Reviewed:  Imaging: CT Chest 08/01/18- RUL GGO nodule measured 50mm and solid nodule measured 54mm  PFT: 08/27/2018 FVC 2.78 (78 %) FEV1 1.76 (65 %) Ratio 63 TLC 117 % RV 175% RV/TLC 144% DLCO 107 % Interpretation: Moderate  obstructive defect with air trapping.  No significant bronchodilator response.  Normal TLC.  Normal DLCO  Labs: CBC 08/06/18 9.5% Eos with 500 eos. Since 2015 absolute eos range from 400-600.    Component Value Date/Time   WBC 5.0 08/06/2018 0746   RBC 4.93 08/06/2018 0746   HGB 15.4 (H) 08/06/2018 0746   HCT 45.1 08/06/2018 0746   PLT 222.0 08/06/2018 0746   MCV 91.4 08/06/2018 0746   MCH 31.5 04/20/2016 1309   MCHC 34.3 08/06/2018 0746   RDW 13.2 08/06/2018 0746   LYMPHSABS 1.5 08/06/2018 0746   MONOABS 0.4 08/06/2018 0746   EOSABS 0.5 08/06/2018 0746   BASOSABS 0.0 08/06/2018 0746   BMET    Component Value Date/Time   NA 141 08/06/2018 0746   K 3.6 08/06/2018 0746  CL 103 08/06/2018 0746   CO2 30 08/06/2018 0746   GLUCOSE 93 08/06/2018 0746   BUN 14 08/06/2018 0746   CREATININE 0.66 08/06/2018 0746   CALCIUM 9.7 08/06/2018 0746   CALCIUM 9.3 08/06/2018 0746   GFRNONAA >60 04/20/2016 1309   GFRAA >60 04/20/2016 1309   Imaging, labs and test noted above have been reviewed independently by me.    Assessment & Plan:   Discussion: 68 year old female never smoker with history of asthma who presents with asthma-COPD overlap syndrome. On lab review she is noted to have eosinophilia ranging from 400-600 since 2015. Although she has a high level activity, she has symptoms with exertion and even at rest at times. We discussed potentially starting a biologic agent including Nucala, Fasenra or Dupixent. For her allergic component and history of rashes, I would prefer initiating Dupixent though any of the other agents would be appropriate. I provided her pamphlets to review her options and see which agent's adverse profile she is most comfortable with.   Moderate COPD/Asthma Overlap Syndrome-uncontrolled CONTINUE Dulera 200 two puffs twice a day. THIS IS YOUR EVERY DAY INHALER CONTINUE Xopenex inhaler as needed for chest tightness. OK to take before activity. She does not tolerate  singulair  Today we discussed 3 possible medications to add to your COPD/Asthma management. We briefly discussed the benefits and the side effects of each medication. Please let me know if you are interested in starting any of these medications. Fasenra Dupixent Nucala  Right upper lobe lung nodules <32mm Follow-up CT Chest without contrast in January 2021  Health Maintenance Immunization History  Administered Date(s) Administered  . Fluad Quad(high Dose 65+) 10/09/2018  . Influenza Split 10/30/2013  . Influenza, High Dose Seasonal PF 10/25/2017  . Influenza-Unspecified 10/19/2014, 10/19/2015, 10/27/2016  . Pneumococcal Conjugate-13 11/28/2013  . Pneumococcal Polysaccharide-23 03/29/2018  . Zoster 02/18/2009   No orders of the defined types were placed in this encounter.  No orders of the defined types were placed in this encounter.   Return in about 3 months (around 02/19/2019).  Greater than 50% of this patient 25-minute office visit was spent face-to-face in counseling with the patient/family. We discussed medical diagnosis and treatment plan as noted.  [place], MD [facility] 11/19/2018 10:41 AM  Office Number [phone]

## 2018-12-11 ENCOUNTER — Other Ambulatory Visit: Payer: Self-pay | Admitting: Internal Medicine

## 2018-12-17 NOTE — Progress Notes (Signed)
Subjective:    Patient ID: Tina Lucas, female    DOB: 06/11/1950, 68 y.o.   MRN: [id number]  HPI The patient is here for an acute visit.   Hand pain, pulled muscle:  She plays pickle ball and she played one week ago.  She felt a little twinge in her posterior distal forearm while playing, but was still able to play.  She denies any popping or snapping pain.  The pain was not severe.  She was playing intensely, but has been playing pickle ball for years.  She has not been playing as much recently as she typically does.  When she got home she put an ice pack on it, but it was uncomfortable to put that on it.  There was no discoloration or swelling at that time..  Over the weekend she continued to have pain in the distal forearm with certain movements.  She has not been using the hand as she typically does.  She has been using the left hand whenever possible.  She does have some swelling in the posterior lateral aspect of the forearm.  There is some bruising, but that is typical for when she takes the Celebrex.  When she extends her hand it increases the pain significantly.  She denies any numbness, tingling or weakness in the hand.     Medications and allergies reviewed with patient and updated if appropriate.  Patient Active Problem List   Diagnosis Date Noted  . Chest wall discomfort 07/22/2018  . Pyrosis 04/26/2018  . Positive colorectal cancer screening using Cologuard test 04/26/2018  . Gastric ulcer without hemorrhage or perforation 04/26/2018  . Abnormal findings on esophagogastroduodenoscopy (EGD) 04/26/2018  . History of bronchitis 03/12/2018  . Atypical chest pain 02/26/2018  . Asthma-COPD overlap syndrome ([place]) 01/31/2018  . Chronic neck pain 08/27/2017  . GERD (gastroesophageal reflux disease) 04/20/2016  . Numbness 08/06/2015  . Osteopenia 12/10/2014  . Allergic urticaria 12/10/2014  . Pressure in head 04/01/2014  . Dizziness 02/10/2014  . Elevated blood pressure  reading 11/06/2013  . Hypothyroid   . Primary HSV infection of mouth     Current Outpatient Medications on File Prior to Visit  Medication Sig Dispense Refill  . Calcium Carbonate-Vitamin D (CALTRATE 600+D) 600-400 MG-UNIT per tablet Take 1 tablet by mouth daily.    . celecoxib (CELEBREX) 100 MG capsule Take 1 capsule (100 mg total) by mouth 2 (two) times daily. (Patient taking differently: Take 100 mg by mouth daily. ) 60 capsule 12  . cholecalciferol (VITAMIN D) 400 UNITS TABS tablet Take 400 Units by mouth.    . famotidine (PEPCID) 10 MG tablet Take 1 tablet (10 mg total) by mouth 2 (two) times daily. 180 tablet 1  . fexofenadine (ALLEGRA) 180 MG tablet Take 180 mg by mouth daily.    . fluocinonide cream (LIDEX) [id number] % Apply 1 application topically 2 (two) times daily as needed (FOR PSORIASIS).     Marland Kitchen levalbuterol (XOPENEX HFA) 45 MCG/ACT inhaler Inhale 1-2 puffs into the lungs every 4 (four) hours as needed for wheezing. 1 Inhaler 6  . levothyroxine (SYNTHROID) 50 MCG tablet TAKE ONE TABLET BY MOUTH EVERY MORNING BEFORE BREAKFAST 90 tablet 0  . LOTEMAX 0.5 % OINT     . Magnesium 250 MG TABS Take 125 mg by mouth daily.    . mometasone-formoterol (DULERA) 200-5 MCG/ACT AERO Inhale 2 puffs into the lungs 2 (two) times daily. 8.8 g 0  . Multiple Vitamins-Minerals (ONE-A-DAY WOMENS 50+  ADVANTAGE PO) Take by mouth daily.    . prednisoLONE acetate (PRED FORTE) 1 % ophthalmic suspension Place 1 drop into the right eye daily. Pt takes 3 to 4 times per week    . Probiotic Product (PROBIOTIC DAILY) CAPS Take 1 capsule by mouth daily. Pt is taking 3 to 4 times a week     No current facility-administered medications on file prior to visit.     Past Medical History:  Diagnosis Date  . Allergic rhinitis   . Allergy   . Asthma    as a child  . 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 COPD (chronic obstructive pulmonary disease) ([place])   . History of asthma  1956-2000  . Hypothyroid 2013  . Osteopenia   . Primary HSV infection of mouth 2008  . Pulmonary nodule   . 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 COLONOSCOPY    . polyp on vocal chord     . TONSILLECTOMY  1958    Social History   Socioeconomic History  . Marital status: Married    Spouse name: Not on file  . Number of children: Not on file  . Years of education: Not on file  . Highest education level: Not on file  Occupational History  . Not on file  Social Needs  . Financial resource strain: Not on file  . Food insecurity    Worry: Not on file    Inability: Not on file  . Transportation needs    Medical: Not on file    Non-medical: Not on file  Tobacco Use  . Smoking status: Never Smoker  . Smokeless tobacco: Never Used  Substance and Sexual Activity  . Alcohol use: Yes    Alcohol/week: 0.0 standard drinks    Comment: socially  . Drug use: No  . Sexual activity: Not on file  Lifestyle  . Physical activity    Days per week: Not on file    Minutes per session: Not on file  . Stress: Not on file  Relationships  . Social Herbalist on phone: Not on file    Gets together: Not on file    Attends religious service: Not on file    Active member of club or organization: Not on file    Attends meetings of clubs or organizations: Not on file    Relationship status: Not on file  Other Topics Concern  . Not on file  Social History Narrative   Lives with husband in a one story home.  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    Family History  Problem Relation Age of Onset  . Breast cancer Mother 36  . Osteoarthritis Father   . Hypertension Father   . Leukemia Maternal Aunt 27  . Atrial fibrillation Brother   . Colon cancer Neg Hx   . Esophageal cancer Neg Hx   . Inflammatory bowel disease Neg Hx   . Liver disease Neg Hx   .  Pancreatic cancer Neg Hx   . Rectal cancer Neg Hx   . Stomach cancer Neg Hx     Review of Systems  Constitutional: Negative for fever.  Neurological: Negative for weakness and numbness.  Hematological: Bruises/bleeds easily (related to celebrex).       Objective:   Vitals:   12/18/18 1409  BP: Marland Kitchen)  146/84  Pulse: 67  Resp: 18  Temp: 98.5 F (36.9 C)  SpO2: 97%   BP Readings from Last 3 Encounters:  12/18/18 (!) 146/84  11/19/18 (!) 170/74  10/09/18 (!) 174/82   Wt Readings from Last 3 Encounters:  12/18/18 124 lb (56.2 kg)  11/19/18 121 lb 12.8 oz (55.2 kg)  10/09/18 121 lb (54.9 kg)   Body mass index is 18.85 kg/m.   Physical Exam Constitutional:      Appearance: Normal appearance.  HENT:     Head: Normocephalic and atraumatic.  Musculoskeletal:     Comments: Tenderness and swelling medial posterior forearm, increased pain especially with wrist flexion  Skin:    General: Skin is warm and dry.     Findings: Bruising (posterior forearm and posterior hand) present.  Neurological:     Mental Status: She is alert.            Assessment & Plan:    See Problem List for Assessment and Plan of chronic medical problems.

## 2018-12-18 ENCOUNTER — Ambulatory Visit (INDEPENDENT_AMBULATORY_CARE_PROVIDER_SITE_OTHER): Payer: Medicare Other | Admitting: Internal Medicine

## 2018-12-18 ENCOUNTER — Other Ambulatory Visit: Payer: Self-pay

## 2018-12-18 ENCOUNTER — Encounter: Payer: Self-pay | Admitting: Internal Medicine

## 2018-12-18 VITALS — BP 146/84 | HR 67 | Temp 98.5°F | Resp 18 | Ht 68.0 in | Wt 124.0 lb

## 2018-12-18 DIAGNOSIS — S63501A Unspecified sprain of right wrist, initial encounter: Secondary | ICD-10-CM | POA: Insufficient documentation

## 2018-12-18 NOTE — Patient Instructions (Addendum)
Continue to avoiding using the right arm   Ice several times a day   Try compression or wrapping the forearm   A referral was ordered for [facility] orthopedics

## 2018-12-18 NOTE — Assessment & Plan Note (Addendum)
Started one week ago Likely strain.  I do not think she tore anything or pulled anything Rest/avoid using, ice several times a day, try compression sleeve Can take her Celebrex 1 a day-will not increase because she bruises easily Ortho referral ordered

## 2018-12-23 ENCOUNTER — Ambulatory Visit: Payer: Medicare Other | Admitting: Family Medicine

## 2018-12-23 ENCOUNTER — Encounter: Payer: Self-pay | Admitting: Family Medicine

## 2018-12-23 ENCOUNTER — Other Ambulatory Visit: Payer: Self-pay

## 2018-12-23 ENCOUNTER — Ambulatory Visit: Payer: Self-pay

## 2018-12-23 DIAGNOSIS — M25531 Pain in right wrist: Secondary | ICD-10-CM

## 2018-12-23 MED ORDER — CELECOXIB 200 MG PO CAPS: 200.0000 mg | ORAL_CAPSULE | Freq: Two times a day (BID) | ORAL | 6 refills | Status: DC | PRN

## 2018-12-23 MED ORDER — DICLOFENAC SODIUM 1 % EX GEL: 4.0000 g | Freq: Four times a day (QID) | CUTANEOUS | 6 refills | Status: DC | PRN

## 2018-12-23 NOTE — Progress Notes (Signed)
I saw and examined the patient with Dr. Mayer Masker and agree with assessment and plan as outlined.    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  Discussed options and elected to treat with celebrex BID for a few days; voltaren gel; wrist splint.  If pain persists, could try medrol dose pack, PT/OT, or injection.

## 2018-12-23 NOTE — Progress Notes (Signed)
Tina Lucas - 68 y.o. female MRN [id number]  Date of birth: [birth year]  Office Visit Note: Visit Date: 12/23/2018 PCP: Binnie Rail, MD Referred by: Binnie Rail, MD  Subjective: Chief Complaint  Patient presents with  . Right Wrist - Pain    Pain since mid October. Unsure of any injury. Has been playing pickleball. 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 Right-hand dominant.   HPI: Tina Lucas is a 68 y.o. female who comes in today with right wrist pain.  She reports that several weeks ago, she started to have pain over dorsal aspect of wrist when playing pickle ball. Pain worse with extension of wrist as well as movement of thumb. No numbness/tingling. She has been icing three times daily as well as continuing to take 1 celebrex daily (she was taking this at baseline for arthritis).   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  ROS Otherwise per HPI.  Assessment & Plan: Visit Diagnoses:  1. Pain in right wrist    Pain in right wrist in pickle ball player, with exam and ultrasound findings consistent with intersection syndrome. Will start with Celebrex BID as well as Voltaren gel PRN. If this does not improve pain, can trial prednisone dose pack or cortisone injection.   Meds & Orders:  Meds ordered this encounter  Medications  . celecoxib (CELEBREX) 200 MG capsule    Sig: Take 1 capsule (200 mg total) by mouth 2 (two) times daily as needed.    Dispense:  60 capsule    Refill:  6  . diclofenac Sodium (VOLTAREN) 1 % GEL    Sig: Apply 4 g topically 4 (four) times daily as needed.    Dispense:  500 g    Refill:  6    Orders Placed This Encounter  Procedures  . [facility] [name] - NO CHARGES    Follow-up: No follow-ups on file.   Procedures: No procedures performed  No notes on file    Clinical History: No specialty comments available.   She reports that she has never smoked. She has never used smokeless tobacco. No results for input(s): HGBA1C, LABURIC in the last 8760 hours.  Objective:  VS:  HT:    WT:   BMI:     BP:   HR: bpm  TEMP: ( )  RESP:  Physical Exam  PHYSICAL EXAM: Gen: NAD, alert, cooperative with exam, well-appearing HEENT: clear conjunctiva,  CV:  no edema, capillary refill brisk, normal rate Resp: non-labored Skin: no rashes, normal turgor  Neuro: no gross deficits.  Psych:  alert and oriented  Ortho Exam  Hand/wrist Exam Right hand/wrist: -Inspection: swelling over radial aspect of dorsal forearm  -Palpation: TTP over radial aspect of dorsal wrist ~ 5cm proximal to wrist joint. Crepitus over area with movement of thumb and wrist -ROM: Normal range of motion with flexion, extension, radial deviation, ulnar deviation, pronation, supination -Strength: Flexion: 5/5; Extension: 5/5; Radial Deviation: 5/5; Ulnar Deviation: 5/5. Pain with resisted extension as well as resisted thumb abudcution -Limb neurovascularly intact  Left wrist: -Inspection: No deformity, no discoloration -Palpation: no TTP -ROM: Normal range of motion with flexion, extension, radial deviation, ulnar deviation, pronation, supination -Strength: Flexion: 5/5; Extension: 5/5; Radial Deviation: 5/5; Ulnar Deviation: 5/5 -Limb neurovascularly  intact  Imaging: Ultrasound of the right dorsal wrist: Compartment 1: Normal-appearing abductor pollicis longus and extensor pollicis brevis without tenosynovitis Compartment 2: Extensor carpi radialis longus and brevis are normal in appearance without tenosynovitis.   Inflammatory changes seen at the intersection of compartments 1 and 2. Compartment 3: Extensor pollicis longus appears normal however there is evidence of tenosynovitis. Compartment 4: Extensor digitorum tendons and sensory indices appear normal.  There is a slight  increase in surrounding fluid. Compartment 5: Extensor digiti minimi I normal appearance without tenosynovitis [place] 6: Extensor carpi ulnaris is normal appearance without tenosynovitis.  Retinaculum visualized and appears intact   Impression: inflammation at area of intersection between 1st and 2nd dorsal compartments consistent with intersection syndrome.  Past Medical/Family/Surgical/Social History: Medications & Allergies reviewed per EMR, new medications updated. Patient Active Problem List   Diagnosis Date Noted  . Forearm sprain, right, initial encounter 12/18/2018  . Chest wall discomfort 07/22/2018  . Pyrosis 04/26/2018  . Positive colorectal cancer screening using Cologuard test 04/26/2018  . Gastric ulcer without hemorrhage or perforation 04/26/2018  . Abnormal findings on esophagogastroduodenoscopy (EGD) 04/26/2018  . History of bronchitis 03/12/2018  . Atypical chest pain 02/26/2018  . Asthma-COPD overlap syndrome ([place]) 01/31/2018  . Chronic neck pain 08/27/2017  . GERD (gastroesophageal reflux disease) 04/20/2016  . Numbness 08/06/2015  . Osteopenia 12/10/2014  . Allergic urticaria 12/10/2014  . Pressure in head 04/01/2014  . Dizziness 02/10/2014  . Elevated blood pressure reading 11/06/2013  . Hypothyroid   . Primary HSV infection of mouth    Past Medical History:  Diagnosis Date  . Allergic rhinitis   . Allergy   . Asthma    as a child  . 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 COPD (chronic obstructive pulmonary disease) ([place])   . History of asthma 1956-2000  . Hypothyroid 2013  . Osteopenia   . Primary HSV infection of mouth 2008  . Pulmonary nodule   . Sunlight-induced angio-edema-urticaria 07/11/2011   unknown trigger, neg autoimmune workup   Family History  Problem Relation Age of Onset  . Breast cancer Mother 40  . Osteoarthritis Father   . Hypertension Father   . Leukemia Maternal Aunt 25  . Atrial  fibrillation Brother   . Colon cancer Neg Hx   . Esophageal cancer Neg Hx   . Inflammatory bowel disease Neg Hx   . Liver disease Neg Hx   . Pancreatic cancer Neg Hx   . Rectal cancer Neg Hx   . Stomach cancer Neg Hx    Past Surgical History:  Procedure Laterality Date  . COLONOSCOPY    . polyp on vocal chord     . TONSILLECTOMY  1958   Social History   Occupational History  . Not on file  Tobacco Use  . Smoking status: Never Smoker  . Smokeless tobacco: Never Used  Substance and Sexual Activity  . Alcohol use: Yes    Alcohol/week: 0.0 standard drinks    Comment: socially  . Drug use: No  . Sexual activity: Not on file

## 2019-01-30 ENCOUNTER — Ambulatory Visit (INDEPENDENT_AMBULATORY_CARE_PROVIDER_SITE_OTHER)
Admission: RE | Admit: 2019-01-30 | Discharge: 2019-01-30 | Disposition: A | Discharge status: Home / Self Care | Payer: Medicare PPO | Source: Ambulatory Visit | Attending: Pulmonary Disease | Admitting: Pulmonary Disease

## 2019-01-30 ENCOUNTER — Other Ambulatory Visit: Payer: Self-pay

## 2019-01-30 DIAGNOSIS — R0602 Shortness of breath: Secondary | ICD-10-CM

## 2019-02-03 ENCOUNTER — Ambulatory Visit: Payer: Medicare PPO | Admitting: Pulmonary Disease

## 2019-02-05 ENCOUNTER — Encounter: Payer: Self-pay | Admitting: Podiatry

## 2019-02-05 ENCOUNTER — Ambulatory Visit: Payer: Medicare Other | Admitting: Podiatry

## 2019-02-05 ENCOUNTER — Other Ambulatory Visit: Payer: Self-pay

## 2019-02-05 DIAGNOSIS — M79674 Pain in right toe(s): Secondary | ICD-10-CM | POA: Diagnosis not present

## 2019-02-05 DIAGNOSIS — M79675 Pain in left toe(s): Secondary | ICD-10-CM | POA: Diagnosis not present

## 2019-02-05 DIAGNOSIS — B351 Tinea unguium: Secondary | ICD-10-CM

## 2019-02-05 DIAGNOSIS — Q828 Other specified congenital malformations of skin: Secondary | ICD-10-CM

## 2019-02-05 NOTE — Patient Instructions (Signed)

## 2019-02-10 NOTE — Progress Notes (Signed)
Subjective: Tina Lucas presents to clinic with cc of painful mycotic toenails and which are aggravated when weightbearing with and without shoe gear.  This pain limits her daily activities. Pain symptoms resolve with periodic professional debridement.  She also relates painful lesion interdigitally between right great toe and right 2nd digit.  Binnie Rail, MD is her PCP.   Medications reviewed in chart.  Allergies  Allergen Reactions  . Singulair [Montelukast Sodium] Other (See Comments)    headaches    Objective: There were no vitals filed for this visit.  Physical Examination:  Vascular Examination: Capillary refill time immediate x 10 digits.  Dorsalis pedis present b/l.  Posterior tibial pulses present b/l.  Digital hair  present x 10 digits.  Skin temperature gradient WNL b/l.  Dermatological Examination: Skin with normal turgor, texture and tone b/l.  No open wounds b/l.  No interdigital macerations noted b/l.  Elongated, thick, discolored brittle toenails with subungual debris and pain on dorsal palpation of nailbeds 1-5 b/l.  Porokeratotic lesions lateral right hallux and medial aspect right 2nd digit with tenderness to palpation. No erythema, no edema, no drainage, no flocculence.   Musculoskeletal Examination: Muscle strength 5/5 to all muscle groups b/l.  No pain, crepitus or joint discomfort with active/passive ROM.  Neurological Examination: Sensation intact 5/5 b/l with 10 gram monofilament.  Vibratory sensation intact b/l.  Proprioceptive sensation intact b/l.  Assessment: 1. Mycotic nail infection with pain 1-5 b/l 2. Painful interdigital corn right hallux, right 2nd digit 3. Pain in toes right foot  Plan: 1. Toenails 1-5 b/l were debrided in length and girth without iatrogenic laceration. 2. Porokeratosis lateral right hallux and medial aspect right 2nd digit pared and enucleated with sterile scalpel blade without incident. 3. Continue  soft, supportive shoe gear daily. 4. Report any pedal injuries to medical professional. 5. Follow up 4 months. 6. Patient/POA to call should there be a question/concern in there interim.

## 2019-02-12 ENCOUNTER — Ambulatory Visit: Payer: Medicare PPO | Attending: Internal Medicine

## 2019-02-12 DIAGNOSIS — Z23 Encounter for immunization: Secondary | ICD-10-CM | POA: Insufficient documentation

## 2019-02-12 NOTE — Progress Notes (Signed)
   Covid-19 Vaccination Clinic  Name:  Tina Lucas    MRN: [id number] DOB: 05/15/1950  02/12/2019  [name] was observed post Covid-19 immunization for 15 minutes without incidence. She was provided with Vaccine Information Sheet and instruction to access the V-Safe system.   [name] was instructed to call 911 with any severe reactions post vaccine: Marland Kitchen Difficulty breathing  . Swelling of your face and throat  . A fast heartbeat  . A bad rash all over your body  . Dizziness and weakness    Immunizations Administered    Name Date Dose VIS Date Route   Pfizer COVID-19 Vaccine 02/12/2019 12:53 PM 0.3 mL 01/03/2019 Intramuscular   Manufacturer: [place]   Lot: [id number]   [place]: [id number]

## 2019-03-05 ENCOUNTER — Ambulatory Visit: Payer: Medicare PPO | Attending: Internal Medicine

## 2019-03-05 DIAGNOSIS — Z23 Encounter for immunization: Secondary | ICD-10-CM | POA: Insufficient documentation

## 2019-03-05 NOTE — Progress Notes (Signed)
   Covid-19 Vaccination Clinic  Name:  Tina Lucas    MRN: [id number] DOB: [birth year]  03/05/2019  [name] was observed post Covid-19 immunization for 15 minutes without incidence. She was provided with Vaccine Information Sheet and instruction to access the V-Safe system.   [name] was instructed to call 911 with any severe reactions post vaccine: Marland Kitchen Difficulty breathing  . Swelling of your face and throat  . A fast heartbeat  . A bad rash all over your body  . Dizziness and weakness    Immunizations Administered    Name Date Dose VIS Date Route   Pfizer COVID-19 Vaccine 03/05/2019  8:26 AM 0.3 mL 01/03/2019 Intramuscular   Manufacturer: [place]   Lot: [id number]   [place]: [id number]

## 2019-03-09 ENCOUNTER — Other Ambulatory Visit: Payer: Self-pay | Admitting: Internal Medicine

## 2019-03-12 ENCOUNTER — Other Ambulatory Visit: Payer: Self-pay

## 2019-03-12 MED ORDER — FAMOTIDINE 40 MG PO TABS: 40.0000 mg | ORAL_TABLET | Freq: Every day | ORAL | 1 refills | Status: DC

## 2019-03-30 NOTE — Patient Instructions (Addendum)
Blood work was ordered.    All other Health Maintenance issues reviewed.   All recommended immunizations and age-appropriate screenings are up-to-date or discussed.  No immunization administered today.   Medications reviewed and updated.  Changes include :   Take omeprazole 40 mg daily thirty minutes prior to a meal.    Your prescription(s) have been submitted to your pharmacy. Please take as directed and contact our office if you believe you are having problem(s) with the medication(s).    Please followup in 1 year, sooner if needed    Health Maintenance, Female Adopting a healthy lifestyle and getting preventive care are important in promoting health and wellness. Ask your health care provider about:  The right schedule for you to have regular tests and exams.  Things you can do on your own to prevent diseases and keep yourself healthy. What should I know about diet, weight, and exercise? Eat a healthy diet   Eat a diet that includes plenty of vegetables, fruits, low-fat dairy products, and lean protein.  Do not eat a lot of foods that are high in solid fats, added sugars, or sodium. Maintain a healthy weight Body mass index (BMI) is used to identify weight problems. It estimates body fat based on height and weight. Your health care provider can help determine your BMI and help you achieve or maintain a healthy weight. Get regular exercise Get regular exercise. This is one of the most important things you can do for your health. Most adults should:  Exercise for at least 150 minutes each week. The exercise should increase your heart rate and make you sweat (moderate-intensity exercise).  Do strengthening exercises at least twice a week. This is in addition to the moderate-intensity exercise.  Spend less time sitting. Even light physical activity can be beneficial. Watch cholesterol and blood lipids Have your blood tested for lipids and cholesterol at 70 years of age, then  have this test every 5 years. Have your cholesterol levels checked more often if:  Your lipid or cholesterol levels are high.  You are older than 70 years of age.  You are at high risk for heart disease. What should I know about cancer screening? Depending on your health history and family history, you may need to have cancer screening at various ages. This may include screening for:  Breast cancer.  Cervical cancer.  Colorectal cancer.  Skin cancer.  Lung cancer. What should I know about heart disease, diabetes, and high blood pressure? Blood pressure and heart disease  High blood pressure causes heart disease and increases the risk of stroke. This is more likely to develop in people who have high blood pressure readings, are of African descent, or are overweight.  Have your blood pressure checked: ? Every 3-5 years if you are 65-76 years of age. ? Every year if you are 94 years old or older. Diabetes Have regular diabetes screenings. This checks your fasting blood sugar level. Have the screening done:  Once every three years after age 43 if you are at a normal weight and have a low risk for diabetes.  More often and at a younger age if you are overweight or have a high risk for diabetes. What should I know about preventing infection? Hepatitis B If you have a higher risk for hepatitis B, you should be screened for this virus. Talk with your health care provider to find out if you are at risk for hepatitis B infection. Hepatitis C Testing is recommended for:  Everyone born from [birth year] through 1966.  Anyone with known risk factors for hepatitis C. Sexually transmitted infections (STIs)  Get screened for STIs, including gonorrhea and chlamydia, if: ? You are sexually active and are younger than 70 years of age. ? You are older than 70 years of age and your health care provider tells you that you are at risk for this type of infection. ? Your sexual activity has changed  since you were last screened, and you are at increased risk for chlamydia or gonorrhea. Ask your health care provider if you are at risk.  Ask your health care provider about whether you are at high risk for HIV. Your health care provider may recommend a prescription medicine to help prevent HIV infection. If you choose to take medicine to prevent HIV, you should first get tested for HIV. You should then be tested every 3 months for as long as you are taking the medicine. Pregnancy  If you are about to stop having your period (premenopausal) and you may become pregnant, seek counseling before you get pregnant.  Take 400 to 800 micrograms (mcg) of folic acid every day if you become pregnant.  Ask for birth control (contraception) if you want to prevent pregnancy. Osteoporosis and menopause Osteoporosis is a disease in which the bones lose minerals and strength with aging. This can result in bone fractures. If you are 51 years old or older, or if you are at risk for osteoporosis and fractures, ask your health care provider if you should:  Be screened for bone loss.  Take a calcium or vitamin D supplement to lower your risk of fractures.  Be given hormone replacement therapy (HRT) to treat symptoms of menopause. Follow these instructions at home: Lifestyle  Do not use any products that contain nicotine or tobacco, such as cigarettes, e-cigarettes, and chewing tobacco. If you need help quitting, ask your health care provider.  Do not use street drugs.  Do not share needles.  Ask your health care provider for help if you need support or information about quitting drugs. Alcohol use  Do not drink alcohol if: ? Your health care provider tells you not to drink. ? You are pregnant, may be pregnant, or are planning to become pregnant.  If you drink alcohol: ? Limit how much you use to 0-1 drink a day. ? Limit intake if you are breastfeeding.  Be aware of how much alcohol is in your drink.  In the [place] one drink equals one 12 oz bottle of beer (355 mL), one 5 oz glass of wine (148 mL), or one 1 oz glass of hard liquor (44 mL). General instructions  Schedule regular health, dental, and eye exams.  Stay current with your vaccines.  Tell your health care provider if: ? You often feel depressed. ? You have ever been abused or do not feel safe at home. Summary  Adopting a healthy lifestyle and getting preventive care are important in promoting health and wellness.  Follow your health care provider's instructions about healthy diet, exercising, and getting tested or screened for diseases.  Follow your health care provider's instructions on monitoring your cholesterol and blood pressure. This information is not intended to replace advice given to you by your health care provider. Make sure you discuss any questions you have with your health care provider. Document Revised: 01/02/2018 Document Reviewed: 01/02/2018 Elsevier Patient Education  2020 [employer].

## 2019-03-30 NOTE — Progress Notes (Signed)
Subjective:    Patient ID: Tina Lucas, female    DOB: 06-Aug-1950, 69 y.o.   MRN: [id number]  HPI She is here for a physical exam.   She continues to deal with poor sleep - gets 5-6 hours with melatonin 5-7 mg.    She continues to have heartburn and her esophagus feels raw.  She is taking the Pepcid daily.  Her eosinophil count is high - she wonders about eosinophilic esophagitis.  She still has SOB from COPD/asthma and tightness around her chest from heartburn and COPD.  She can not wear a sports bra from the tightness.  She uses do [place] daily and takes the Xopenex prior to exercise.   Medications and allergies reviewed with patient and updated if appropriate.  Patient Active Problem List   Diagnosis Date Noted  . Forearm sprain, right, initial encounter 12/18/2018  . Chest wall discomfort 07/22/2018  . Pyrosis 04/26/2018  . Positive colorectal cancer screening using Cologuard test 04/26/2018  . Gastric ulcer without hemorrhage or perforation 04/26/2018  . Abnormal findings on esophagogastroduodenoscopy (EGD) 04/26/2018  . History of bronchitis 03/12/2018  . Atypical chest pain 02/26/2018  . Asthma-COPD overlap syndrome ([place]) 01/31/2018  . Chronic neck pain 08/27/2017  . GERD (gastroesophageal reflux disease) 04/20/2016  . Numbness 08/06/2015  . Osteopenia 12/10/2014  . Allergic urticaria 12/10/2014  . Pressure in head 04/01/2014  . Dizziness 02/10/2014  . Elevated blood pressure reading 11/06/2013  . Hypothyroid   . Primary HSV infection of mouth     Current Outpatient Medications on File Prior to Visit  Medication Sig Dispense Refill  . Calcium Carbonate-Vitamin D (CALTRATE 600+D) 600-400 MG-UNIT per tablet Take 1 tablet by mouth daily.    . celecoxib (CELEBREX) 100 MG capsule Take 1 capsule (100 mg total) by mouth 2 (two) times daily. (Patient taking differently: Take 100 mg by mouth daily. ) 60 capsule 12  . cholecalciferol (VITAMIN D) 400 UNITS TABS tablet  Take 400 Units by mouth.    . famotidine (PEPCID) 40 MG tablet Take 1 tablet (40 mg total) by mouth daily. 90 tablet 1  . fexofenadine (ALLEGRA) 180 MG tablet Take 180 mg by mouth daily.    . fluocinonide cream (LIDEX) AB-123456789 % Apply 1 application topically 2 (two) times daily as needed (FOR PSORIASIS).     Marland Kitchen levalbuterol (XOPENEX HFA) 45 MCG/ACT inhaler Inhale 1-2 puffs into the lungs every 4 (four) hours as needed for wheezing. 1 Inhaler 6  . LOTEMAX 0.5 % OINT     . Magnesium 250 MG TABS Take 125 mg by mouth daily.    . mometasone-formoterol (DULERA) 200-5 MCG/ACT AERO Inhale 2 puffs into the lungs 2 (two) times daily. 8.8 g 0  . Multiple Vitamins-Minerals (ONE-A-DAY WOMENS 50+ ADVANTAGE PO) Take by mouth daily.    . prednisoLONE acetate (PRED FORTE) 1 % ophthalmic suspension Place 1 drop into the right eye daily. Pt takes 3 to 4 times per week    . Probiotic Product (PROBIOTIC DAILY) CAPS Take 1 capsule by mouth daily. Pt is taking 3 to 4 times a week     No current facility-administered medications on file prior to visit.    Past Medical History:  Diagnosis Date  . Allergic rhinitis   . Allergy   . Asthma    as a child  . 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 COPD (chronic obstructive pulmonary disease) ([place])   . History of  asthma 1956-2000  . Hypothyroid 2013  . Osteopenia   . Primary HSV infection of mouth 2008  . Pulmonary nodule   . 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 COLONOSCOPY    . polyp on vocal chord     . TONSILLECTOMY  1958    Social History   Socioeconomic History  . Marital status: Married    Spouse name: Not on file  . Number of children: Not on file  . Years of education: Not on file  . Highest education level: Not on file  Occupational History  . Not on file  Tobacco Use  . Smoking status: Never Smoker    . Smokeless tobacco: Never Used  Substance and Sexual Activity  . Alcohol use: Yes    Alcohol/week: 0.0 standard drinks    Comment: socially  . Drug use: No  . Sexual activity: Not on file  Other Topics Concern  . Not on file  Social History Narrative   Lives with husband in a one story home.  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   Social Determinants of Health   Financial Resource Strain:   . Difficulty of Paying Living Expenses: Not on file  Food Insecurity:   . Worried About Charity fundraiser in the Last Year: Not on file  . Ran Out of Food in the Last Year: Not on file  Transportation Needs:   . Lack of Transportation (Medical): Not on file  . Lack of Transportation (Non-Medical): Not on file  Physical Activity:   . Days of Exercise per Week: Not on file  . Minutes of Exercise per Session: Not on file  Stress:   . Feeling of Stress : Not on file  Social Connections:   . Frequency of Communication with Friends and Family: Not on file  . Frequency of Social Gatherings with Friends and Family: Not on file  . Attends Religious Services: Not on file  . Active Member of Clubs or Organizations: Not on file  . Attends Archivist Meetings: Not on file  . Marital Status: Not on file    Family History  Problem Relation Age of Onset  . Breast cancer Mother 19  . Osteoarthritis Father   . Hypertension Father   . Leukemia Maternal Aunt 58  . Atrial fibrillation Brother   . Colon cancer Neg Hx   . Esophageal cancer Neg Hx   . Inflammatory bowel disease Neg Hx   . Liver disease Neg Hx   . Pancreatic cancer Neg Hx   . Rectal cancer Neg Hx   . Stomach cancer Neg Hx     Review of Systems  Constitutional: Negative for chills and fever.  Eyes: Negative for visual disturbance.  Respiratory: Positive for chest tightness and shortness of breath. Negative for cough and wheezing.   Cardiovascular: Negative for chest pain, palpitations and leg swelling.   Gastrointestinal: Positive for abdominal distention (bloating). Negative for abdominal pain, blood in stool (no black stool), constipation, diarrhea and nausea.       GERD  Genitourinary: Negative for dysuria and hematuria.  Musculoskeletal: Positive for arthralgias.  Skin: Negative for rash.  Neurological: Positive for dizziness (intermittent). Negative for light-headedness and headaches.  Psychiatric/Behavioral: Positive for sleep disturbance. Negative for dysphoric mood. The patient is nervous/anxious (controlled).        Objective:   Vitals:   03/31/19  0745  BP: (!) 146/82  Pulse: 83  Resp: 16  Temp: 98 F (36.7 C)  SpO2: 97%   Filed Weights   03/31/19 0745  Weight: 117 lb 12.8 oz (53.4 kg)   Body mass index is 17.91 kg/m.  BP Readings from Last 3 Encounters:  03/31/19 (!) 146/82  12/18/18 (!) 146/84  11/19/18 (!) 170/74    Wt Readings from Last 3 Encounters:  03/31/19 117 lb 12.8 oz (53.4 kg)  12/18/18 124 lb (56.2 kg)  11/19/18 121 lb 12.8 oz (55.2 kg)     Physical Exam Constitutional: She appears well-developed and well-nourished. No distress.  HENT:  Head: Normocephalic and atraumatic.  Right Ear: External ear normal. Normal ear canal and TM Left Ear: External ear normal.  Normal ear canal and TM Mouth/Throat: Oropharynx is clear and moist.  Eyes: Conjunctivae and EOM are normal.  Neck: Neck supple. No tracheal deviation present. No thyromegaly present.  No carotid bruit  Cardiovascular: Normal rate, regular rhythm and normal heart sounds.  No murmur heard.  No edema.  Minimal varicose veins bilateral lower extremities Pulmonary/Chest: Effort normal and breath sounds normal. No respiratory distress. She has no wheezes. She has no rales.  Breast: deferred   Abdominal: Soft. She exhibits no distension. There is no tenderness.  Lymphadenopathy: She has no cervical adenopathy.  Skin: Skin is warm and dry. She is not diaphoretic.   Psychiatric: She has a  normal mood and affect. Her behavior is normal.        Assessment & Plan:   Physical exam: Screening blood work    ordered Immunizations  Discussed tdap, had covid Colonoscopy   up to date Mammogram  Up to date Dexa  Up to date  Eye exams   Up to date  Exercise  regular Weight   Normal BMI Substance abuse    none  See Problem List for Assessment and Plan of chronic medical problems.    This visit occurred during the SARS-CoV-2 public health emergency.  Safety protocols were in place, including screening questions prior to the visit, additional usage of staff PPE, and extensive cleaning of exam room while observing appropriate contact time as indicated for disinfecting solutions.

## 2019-03-31 ENCOUNTER — Other Ambulatory Visit: Payer: Self-pay

## 2019-03-31 ENCOUNTER — Ambulatory Visit (INDEPENDENT_AMBULATORY_CARE_PROVIDER_SITE_OTHER): Payer: Medicare PPO | Admitting: Internal Medicine

## 2019-03-31 ENCOUNTER — Encounter: Payer: Self-pay | Admitting: Internal Medicine

## 2019-03-31 VITALS — BP 146/82 | HR 83 | Temp 98.0°F | Resp 16 | Ht 68.0 in | Wt 117.8 lb

## 2019-03-31 DIAGNOSIS — Z0000 Encounter for general adult medical examination without abnormal findings: Secondary | ICD-10-CM

## 2019-03-31 DIAGNOSIS — R030 Elevated blood-pressure reading, without diagnosis of hypertension: Secondary | ICD-10-CM | POA: Diagnosis not present

## 2019-03-31 DIAGNOSIS — E039 Hypothyroidism, unspecified: Secondary | ICD-10-CM | POA: Diagnosis not present

## 2019-03-31 DIAGNOSIS — M85859 Other specified disorders of bone density and structure, unspecified thigh: Secondary | ICD-10-CM

## 2019-03-31 DIAGNOSIS — J449 Chronic obstructive pulmonary disease, unspecified: Secondary | ICD-10-CM | POA: Diagnosis not present

## 2019-03-31 DIAGNOSIS — K219 Gastro-esophageal reflux disease without esophagitis: Secondary | ICD-10-CM

## 2019-03-31 LAB — COMPREHENSIVE METABOLIC PANEL
ALT: 18 U/L (ref 0–35)
AST: 17 U/L (ref 0–37)
Albumin: 4.5 g/dL (ref 3.5–5.2)
Alkaline Phosphatase: 65 U/L (ref 39–117)
BUN: 11 mg/dL (ref 6–23)
CO2: 31 mEq/L (ref 19–32)
Calcium: 9.6 mg/dL (ref 8.4–10.5)
Chloride: 102 mEq/L (ref 96–112)
Creatinine, Ser: 0.72 mg/dL (ref 0.40–1.20)
GFR: 80.15 mL/min (ref >60.00)
Glucose, Bld: 103 mg/dL — ABNORMAL HIGH (ref 70–99)
Potassium: 4.1 mEq/L (ref 3.5–5.1)
Sodium: 140 mEq/L (ref 135–145)
Total Bilirubin: 0.7 mg/dL (ref 0.2–1.2)
Total Protein: 7.2 g/dL (ref 6.0–8.3)

## 2019-03-31 LAB — CBC WITH DIFFERENTIAL/PLATELET
Basophils Absolute: 0 10*3/uL (ref 0.0–0.1)
Basophils Relative: 0.8 % (ref 0.0–3.0)
Eosinophils Absolute: 0.2 10*3/uL (ref 0.0–0.7)
Eosinophils Relative: 3.4 % (ref 0.0–5.0)
HCT: 44.2 % (ref 36.0–46.0)
Hemoglobin: 15.3 g/dL — ABNORMAL HIGH (ref 12.0–15.0)
Lymphocytes Relative: 17.5 % (ref 12.0–46.0)
Lymphs Abs: 1 10*3/uL (ref 0.7–4.0)
MCHC: 34.6 g/dL (ref 30.0–36.0)
MCV: 91.5 fl (ref 78.0–100.0)
Monocytes Absolute: 0.5 10*3/uL (ref 0.1–1.0)
Monocytes Relative: 8.8 % (ref 3.0–12.0)
Neutro Abs: 4 10*3/uL (ref 1.4–7.7)
Neutrophils Relative %: 69.5 % (ref 43.0–77.0)
Platelets: 249 10*3/uL (ref 150.0–400.0)
RBC: 4.82 Mil/uL (ref 3.87–5.11)
RDW: 13.1 % (ref 11.5–15.5)
WBC: 5.7 10*3/uL (ref 4.0–10.5)

## 2019-03-31 LAB — TSH: TSH: 2.62 u[IU]/mL (ref 0.35–4.50)

## 2019-03-31 LAB — LIPID PANEL
Cholesterol: 202 mg/dL — ABNORMAL HIGH (ref 0–200)
HDL: 82.3 mg/dL (ref >39.00)
LDL Cholesterol: 113 mg/dL — ABNORMAL HIGH (ref 0–99)
NonHDL: 119.92
Total CHOL/HDL Ratio: 2
Triglycerides: 34 mg/dL (ref 0.0–149.0)
VLDL: 6.8 mg/dL (ref 0.0–40.0)

## 2019-03-31 MED ORDER — OMEPRAZOLE 40 MG PO CPDR: 40.0000 mg | DELAYED_RELEASE_CAPSULE | Freq: Every day | ORAL | 1 refills | Status: DC

## 2019-03-31 MED ORDER — LEVOTHYROXINE SODIUM 50 MCG PO TABS: 50.0000 ug | ORAL_TABLET | Freq: Every day | ORAL | 3 refills | Status: DC

## 2019-03-31 NOTE — Assessment & Plan Note (Addendum)
Chronic GERD not controlled Discussed that she needs a stronger medication to help get this controlled-has tolerated omeprazole in the past-start omeprazole 40 mg daily-take 30 minutes prior to meal If this helps we may be able to get her to the lower dose of 20 mg daily.  She may or may not be able to go down to Pepcid daily She is fairly compliant with a GERD diet

## 2019-03-31 NOTE — Assessment & Plan Note (Signed)
Chronic  Clinically euthyroid Check tsh  Titrate med dose if needed  

## 2019-03-31 NOTE — Assessment & Plan Note (Addendum)
chronic White coat htn - BP controlled at home (reviewed readings from home), minimally elevated here today Continue to monitor at home No need for medication at this time

## 2019-03-31 NOTE — Assessment & Plan Note (Addendum)
Chronic Following with pulmonary On dulera daily, xopenex prn Fairly controlled with above medication, but does require the Xopenex prior to exercise.  Still has some symptoms, which may be related to uncontrolled GERD

## 2019-03-31 NOTE — Assessment & Plan Note (Signed)
Chronic dexa up to date Exercising regularly Taking vitamin d and calcium daily Continue above

## 2019-04-01 ENCOUNTER — Encounter: Payer: Self-pay | Admitting: Internal Medicine

## 2019-04-13 ENCOUNTER — Other Ambulatory Visit: Payer: Self-pay | Admitting: Pulmonary Disease

## 2019-04-15 ENCOUNTER — Other Ambulatory Visit: Payer: Self-pay | Admitting: Internal Medicine

## 2019-04-16 DIAGNOSIS — Z1231 Encounter for screening mammogram for malignant neoplasm of breast: Secondary | ICD-10-CM | POA: Diagnosis not present

## 2019-05-05 ENCOUNTER — Encounter: Payer: Self-pay | Admitting: Internal Medicine

## 2019-05-05 ENCOUNTER — Ambulatory Visit: Payer: Medicare PPO | Admitting: Internal Medicine

## 2019-05-05 ENCOUNTER — Other Ambulatory Visit: Payer: Self-pay

## 2019-05-05 VITALS — BP 162/80 | HR 82 | Temp 98.4°F | Resp 16 | Ht 68.0 in | Wt 122.0 lb

## 2019-05-05 DIAGNOSIS — K219 Gastro-esophageal reflux disease without esophagitis: Secondary | ICD-10-CM

## 2019-05-05 MED ORDER — OMEPRAZOLE 40 MG PO CPDR: 40.0000 mg | DELAYED_RELEASE_CAPSULE | Freq: Two times a day (BID) | ORAL | 1 refills | Status: DC

## 2019-05-05 NOTE — Progress Notes (Signed)
Subjective:    Patient ID: Tina Lucas, female    DOB: Jun 07, 1950, 69 y.o.   MRN: [id number]  HPI The patient is here for an acute visit to discuss medication changes.   After a meal she has chest pain that she definitely feels is her esophagus - it is pain, not discomfort.  Starting the prilosec has helped the burning.  Once a week after eating she will burp and feel reflux.  She still has loud burping.  She can not go 48 hrs w/o pain.    Sunday - after eating yogurt and an apple she had had lower chest pain that persisted all evening.  She is very compliant with a GERD diet and can not eat any healthier.    Her COPD/asthma is controlled.  She is exercising regularly.        Medications and allergies reviewed with patient and updated if appropriate.  Patient Active Problem List   Diagnosis Date Noted  . Chest wall discomfort 07/22/2018  . Pyrosis 04/26/2018  . Positive colorectal cancer screening using Cologuard test 04/26/2018  . Gastric ulcer without hemorrhage or perforation 04/26/2018  . Abnormal findings on esophagogastroduodenoscopy (EGD) 04/26/2018  . Atypical chest pain 02/26/2018  . Asthma-COPD overlap syndrome ([place]) 01/31/2018  . Chronic neck pain 08/27/2017  . GERD (gastroesophageal reflux disease) 04/20/2016  . Numbness 08/06/2015  . Osteopenia 12/10/2014  . Allergic urticaria 12/10/2014  . Pressure in head 04/01/2014  . Dizziness 02/10/2014  . Elevated blood pressure reading 11/06/2013  . Hypothyroid   . Primary HSV infection of mouth     Current Outpatient Medications on File Prior to Visit  Medication Sig Dispense Refill  . Calcium Carbonate-Vitamin D (CALTRATE 600+D) 600-400 MG-UNIT per tablet Take 1 tablet by mouth daily.    . celecoxib (CELEBREX) 100 MG capsule TAKE ONE CAPSULE BY MOUTH TWICE A DAY 60 capsule 2  . cholecalciferol (VITAMIN D) 400 UNITS TABS tablet Take 400 Units by mouth.    . famotidine (PEPCID) 40 MG tablet Take 1 tablet (40 mg  total) by mouth daily. 90 tablet 1  . fexofenadine (ALLEGRA) 180 MG tablet Take 180 mg by mouth daily.    . fluocinonide cream (LIDEX) [id number] % Apply 1 application topically 2 (two) times daily as needed (FOR PSORIASIS).     Marland Kitchen levalbuterol (XOPENEX HFA) 45 MCG/ACT inhaler Inhale 1-2 puffs into the lungs every 4 (four) hours as needed for wheezing. 1 Inhaler 6  . levothyroxine (SYNTHROID) 50 MCG tablet Take 1 tablet (50 mcg total) by mouth daily with breakfast. 90 tablet 3  . LOTEMAX 0.5 % OINT     . Magnesium 250 MG TABS Take 125 mg by mouth daily.    . mometasone-formoterol (DULERA) 200-5 MCG/ACT AERO Inhale 2 puffs into the lungs 2 (two) times daily. Appt needed for future refills. 13 g 0  . Multiple Vitamins-Minerals (ONE-A-DAY WOMENS 50+ ADVANTAGE PO) Take by mouth daily.    . prednisoLONE acetate (PRED FORTE) 1 % ophthalmic suspension Place 1 drop into the right eye daily. Pt takes 3 to 4 times per week    . Probiotic Product (PROBIOTIC DAILY) CAPS Take 1 capsule by mouth daily. Pt is taking 3 to 4 times a week     No current facility-administered medications on file prior to visit.    Past Medical History:  Diagnosis Date  . Allergic rhinitis   . Allergy   . Asthma    as a child  .  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 COPD (chronic obstructive pulmonary disease) ([place])   . History of asthma 1956-2000  . Hypothyroid 2013  . Osteopenia   . Primary HSV infection of mouth 2008  . Pulmonary nodule   . 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 COLONOSCOPY    . polyp on vocal chord     . TONSILLECTOMY  1958    Social History   Socioeconomic History  . Marital status: Married    Spouse name: Not on file  . Number of children: Not on file  . Years of education: Not on file  . Highest education level: Not on file  Occupational History   . Not on file  Tobacco Use  . Smoking status: Never Smoker  . Smokeless tobacco: Never Used  Substance and Sexual Activity  . Alcohol use: Yes    Alcohol/week: 0.0 standard drinks    Comment: socially  . Drug use: No  . Sexual activity: Not on file  Other Topics Concern  . Not on file  Social History Narrative   Lives with husband in a one story home.  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   Social Determinants of Health   Financial Resource Strain:   . Difficulty of Paying Living Expenses:   Food Insecurity:   . Worried About Charity fundraiser in the Last Year:   . Arboriculturist in the Last Year:   Transportation Needs:   . Film/video editor (Medical):   Marland Kitchen Lack of Transportation (Non-Medical):   Physical Activity:   . Days of Exercise per Week:   . Minutes of Exercise per Session:   Stress:   . Feeling of Stress :   Social Connections:   . Frequency of Communication with Friends and Family:   . Frequency of Social Gatherings with Friends and Family:   . Attends Religious Services:   . Active Member of Clubs or Organizations:   . Attends Archivist Meetings:   Marland Kitchen Marital Status:     Family History  Problem Relation Age of Onset  . Breast cancer Mother 81  . Osteoarthritis Father   . Hypertension Father   . Leukemia Maternal Aunt 56  . Atrial fibrillation Brother   . Colon cancer Neg Hx   . Esophageal cancer Neg Hx   . Inflammatory bowel disease Neg Hx   . Liver disease Neg Hx   . Pancreatic cancer Neg Hx   . Rectal cancer Neg Hx   . Stomach cancer Neg Hx     Review of Systems Per HPI    Objective:   Vitals:   05/05/19 1515  BP: (!) 162/80  Pulse: 82  Resp: 16  Temp: 98.4 F (36.9 C)  SpO2: 99%   BP Readings from Last 3 Encounters:  05/05/19 (!) 162/80  03/31/19 (!) 146/82  12/18/18 (!) 146/84   Wt Readings from Last 3 Encounters:  05/05/19 122 lb (55.3 kg)  03/31/19 117 lb 12.8 oz (53.4 kg)  12/18/18 124 lb  (56.2 kg)   Body mass index is 18.55 kg/m.   Physical Exam Constitutional:      General: She is not in acute distress.    Appearance: Normal appearance. She is not ill-appearing.  HENT:     Head: Normocephalic and atraumatic.  Skin:  General: Skin is warm and dry.  Neurological:     Mental Status: She is alert.  Psychiatric:     Comments: anxious            Assessment & Plan:    20 minutes were spent face-to-face with the patient discussing her symptoms, reviewing etiology of her GERD, discussing treatment options    See Problem List for Assessment and Plan of chronic medical problems.    This visit occurred during the SARS-CoV-2 public health emergency.  Safety protocols were in place, including screening questions prior to the visit, additional usage of staff PPE, and extensive cleaning of exam room while observing appropriate contact time as indicated for disinfecting solutions.

## 2019-05-05 NOTE — Patient Instructions (Addendum)
Medications reviewed and updated.  Changes include :   Increase prilosec to 40 mg twice daily - take 30 minutes before meals.    Try holding or cutting down on the Celebrex.    Your prescription(s) have been submitted to your pharmacy. Please take as directed and contact our office if you believe you are having problem(s) with the medication(s).    Follow up with GI

## 2019-05-05 NOTE — Assessment & Plan Note (Addendum)
Chronic Not controlled Increase prilosec to 40 mg BID AC Can continue tums Continue gerd diet Try cutting down on celebrex Follow up with GI

## 2019-05-11 ENCOUNTER — Other Ambulatory Visit: Payer: Self-pay | Admitting: Pulmonary Disease

## 2019-05-19 ENCOUNTER — Other Ambulatory Visit: Payer: Self-pay

## 2019-05-19 ENCOUNTER — Encounter: Payer: Self-pay | Admitting: Pulmonary Disease

## 2019-05-19 ENCOUNTER — Ambulatory Visit: Payer: Medicare PPO | Admitting: Pulmonary Disease

## 2019-05-19 VITALS — BP 178/68 | HR 72 | Temp 97.8°F | Ht 68.0 in | Wt 120.4 lb

## 2019-05-19 DIAGNOSIS — J449 Chronic obstructive pulmonary disease, unspecified: Secondary | ICD-10-CM | POA: Diagnosis not present

## 2019-05-19 MED ORDER — DULERA 200-5 MCG/ACT IN AERO: 2.0000 | INHALATION_SPRAY | Freq: Two times a day (BID) | RESPIRATORY_TRACT | 11 refills | Status: DC

## 2019-05-19 NOTE — Patient Instructions (Addendum)
Moderate COPD/Asthma Overlap Syndrome - controlled CONTINUE Dulera 200-5 mcg TWO puffs TWICE a day. THIS IS YOUR EVERY DAY INHALER CONTINUE Xopenex inhaler as needed for chest tightness. OK to take before activity.  Follow-up in 6 months with me

## 2019-05-19 NOTE — Progress Notes (Signed)
Subjective:   PATIENT ID: Tina Lucas GENDER: female DOB: 1950-09-30, MRN: [id number]   HPI  Chief Complaint  Patient presents with  . Hospitalization Follow-up    no whezzing, no icrease in SOB or cough     Reason for Visit: Follow-up  Ms. Akirra Novack is a 69 year old female never smoker with childhood asthma (shots, nebulizers and inhalers) who presents for follow-up.  She is compliant with her Dulera. She does use her Xopenex at least weekly prior to activities including walking and pickleball. Denies wheezing or coughing. She will have dyspnea on exertion which is expected and able to get work-up. Her stamina is not at the level before COVID. She is able to do her strength exercises. Still walking and standing bike.  She is recently been dealing with reflux issues including substernal chest burning/discomfort associated with belching. She feels this is more likely preventing her from doing activity rather than her breathing.  ACT: 5  Social History: Never smoker Retired Pharmacist, hospital MS degree in exercise physiology No exposures to COVID-19.  She lives in [place] near her grandchildren  Environmental exposures:  Significant second-hand (father) smoke exposure and noticeably improved after moving for college.   No known or significant exposures to chemicals, raw materials or metals  I have personally reviewed patient's past medical/family/social history/allergies/current medications.  Past Medical History:  Diagnosis Date  . Allergic rhinitis   . Allergy   . Asthma    as a child  . 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 COPD (chronic obstructive pulmonary disease) ([place])   . History of asthma 1956-2000  . Hypothyroid 2013  . Osteopenia   . Primary HSV infection of mouth 2008  . Pulmonary nodule   . Sunlight-induced angio-edema-urticaria 07/11/2011   unknown trigger, neg autoimmune workup     Outpatient Medications Prior  to Visit  Medication Sig Dispense Refill  . Calcium Carbonate-Vitamin D (CALTRATE 600+D) 600-400 MG-UNIT per tablet Take 1 tablet by mouth daily.    . celecoxib (CELEBREX) 100 MG capsule TAKE ONE CAPSULE BY MOUTH TWICE A DAY (Patient taking differently: Take 100 mg by mouth daily. ) 60 capsule 2  . cholecalciferol (VITAMIN D) 400 UNITS TABS tablet Take 400 Units by mouth.    . fexofenadine (ALLEGRA) 180 MG tablet Take 180 mg by mouth daily.    . fluocinonide cream (LIDEX) [id number] % Apply 1 application topically 2 (two) times daily as needed (FOR PSORIASIS).     Marland Kitchen levalbuterol (XOPENEX HFA) 45 MCG/ACT inhaler Inhale 1-2 puffs into the lungs every 4 (four) hours as needed for wheezing. 1 Inhaler 6  . levothyroxine (SYNTHROID) 50 MCG tablet Take 1 tablet (50 mcg total) by mouth daily with breakfast. 90 tablet 3  . LOTEMAX 0.5 % OINT     . Magnesium 250 MG TABS Take 125 mg by mouth daily.    . mometasone-formoterol (DULERA) 200-5 MCG/ACT AERO Inhale 2 puffs into the lungs 2 (two) times daily. Appt needed for future refills. 13 g 0  . Multiple Vitamins-Minerals (ONE-A-DAY WOMENS 50+ ADVANTAGE PO) Take by mouth daily.    Marland Kitchen omeprazole (PRILOSEC) 40 MG capsule Take 1 capsule (40 mg total) by mouth 2 (two) times daily before a meal. 180 capsule 1  . prednisoLONE acetate (PRED FORTE) 1 % ophthalmic suspension Place 1 drop into the right eye daily. Pt takes 3 to 4 times per week    . Probiotic Product (PROBIOTIC DAILY)  CAPS Take 1 capsule by mouth daily. Pt is taking 3 to 4 times a week    . famotidine (PEPCID) 40 MG tablet Take 1 tablet (40 mg total) by mouth daily. 90 tablet 1   No facility-administered medications prior to visit.    Review of Systems  Constitutional: Negative for chills, diaphoresis, fever, malaise/fatigue and weight loss.  HENT: Negative for congestion.   Respiratory: Positive for shortness of breath. Negative for cough, hemoptysis, sputum production and wheezing.   Cardiovascular:  Negative for chest pain, palpitations and leg swelling.  Gastrointestinal: Positive for heartburn.    Objective:   Vitals:   05/19/19 1051  BP: (!) 178/68  Pulse: 72  Temp: 97.8 F (36.6 C)  TempSrc: Temporal  SpO2: 98%  Weight: 120 lb 6.4 oz (54.6 kg)  Height: 5\' 8"  (1.727 m)   SpO2: 98 % O2 Device: None (Room air)  Physical Exam: General: Well-appearing, no acute distress HENT: [place], AT Eyes: EOMI, no scleral icterus Respiratory: Clear to auscultation bilaterally.  No crackles, wheezing or rales Cardiovascular: RRR, -M/R/G, no JVD GI: BS+, soft, nontender Extremities:-Edema,-tenderness Neuro: AAO x4, CNII-XII grossly intact Skin: Intact, no rashes or bruising Psych: Normal mood, normal affect  Data Reviewed:  Imaging: CT Chest 08/01/18- RUL GGO nodule measured 27mm and solid nodule measured 80mm CT Chest 01/30/19 - RUL nodule increased to 87mm. Scattered lung nodules <39mm in left and right upper lobes.  PFT: 08/27/2018 FVC 2.78 (78 %) FEV1 1.76 (65 %) Ratio 63 TLC 117 % RV 175% RV/TLC 144% DLCO 107 % Interpretation: Moderate obstructive defect with air trapping.  No significant bronchodilator response.  Normal TLC.  Normal DLCO  Labs: CBC 08/06/18 9.5% Eos with 500 eos. Since 2015 absolute eos range from 400-600.  Imaging, labs and test noted above have been reviewed independently by me.    Assessment & Plan:   Discussion: 69 year old female never smoker with history of asthma who presents with asthma-COPD overlap syndrome. On lab review she is noted to have eosinophilia ranging from 400-600 since 2015. Although she has a high level activity, she has symptoms with exertion and even at rest at times. Declines biologics at this time which I think is reasonable given her high functional status. We can consider it in the future if needed.  Moderate COPD/Asthma Overlap Syndrome - controlled REFILL Dulera 200-5 mcg TWO puffs TWICE a day. THIS IS YOUR EVERY DAY INHALER CONTINUE  Xopenex inhaler as needed for chest tightness. OK to take before activity. She does not tolerate singulair No indication for biologics at this time  Right upper lobe lung nodules <68mm Recommend CT Chest without contrast in January 2022  Health Maintenance Immunization History  Administered Date(s) Administered  . Fluad Quad(high Dose 65+) 10/09/2018  . Influenza Split 10/30/2013  . Influenza, High Dose Seasonal PF 10/25/2017  . Influenza-Unspecified 10/19/2014, 10/19/2015, 10/27/2016  . PFIZER SARS-COV-2 Vaccination 02/12/2019, 03/05/2019  . Pneumococcal Conjugate-13 11/28/2013  . Pneumococcal Polysaccharide-23 03/29/2018  . Zoster 02/18/2009   No orders of the defined types were placed in this encounter.  Meds ordered this encounter  Medications  . mometasone-formoterol (DULERA) 200-5 MCG/ACT AERO    Sig: Inhale 2 puffs into the lungs 2 (two) times daily. Appt needed for future refills.    Dispense:  13 g    Refill:  11    Order Specific Question:   Lot Number?    Answer:   [id number]    Order Specific Question:   Expiration Date?  Answer:   08/07/2019    Order Specific Question:   Manufacturer?    Answer:   [employer] [[id number]]    Order Specific Question:   Quantity    Answer:   1   Return in about 6 months (around 11/18/2019).  I have spent a total time of 31-minutes on the day of the appointment reviewing prior documentation, coordinating care and discussing medical diagnosis and plan with the patient/family. Imaging, labs and tests included in this note have been reviewed and interpreted independently by me.  [place], MD [facility] 05/19/2019 11:20 AM  Office Number [phone]

## 2019-06-09 ENCOUNTER — Other Ambulatory Visit: Payer: Self-pay

## 2019-06-09 ENCOUNTER — Ambulatory Visit: Payer: Medicare PPO | Admitting: Podiatry

## 2019-06-09 ENCOUNTER — Encounter: Payer: Self-pay | Admitting: Podiatry

## 2019-06-09 DIAGNOSIS — Q828 Other specified congenital malformations of skin: Secondary | ICD-10-CM

## 2019-06-09 DIAGNOSIS — B351 Tinea unguium: Secondary | ICD-10-CM | POA: Diagnosis not present

## 2019-06-09 DIAGNOSIS — M79674 Pain in right toe(s): Secondary | ICD-10-CM

## 2019-06-09 DIAGNOSIS — M79675 Pain in left toe(s): Secondary | ICD-10-CM | POA: Diagnosis not present

## 2019-06-09 NOTE — Patient Instructions (Signed)

## 2019-06-12 ENCOUNTER — Ambulatory Visit: Payer: Medicare PPO | Admitting: Gastroenterology

## 2019-06-12 ENCOUNTER — Encounter: Payer: Self-pay | Admitting: Gastroenterology

## 2019-06-12 VITALS — BP 140/80 | HR 76 | Ht 68.0 in | Wt 120.0 lb

## 2019-06-12 DIAGNOSIS — K219 Gastro-esophageal reflux disease without esophagitis: Secondary | ICD-10-CM

## 2019-06-12 DIAGNOSIS — R12 Heartburn: Secondary | ICD-10-CM

## 2019-06-12 DIAGNOSIS — Z8719 Personal history of other diseases of the digestive system: Secondary | ICD-10-CM | POA: Diagnosis not present

## 2019-06-12 DIAGNOSIS — K449 Diaphragmatic hernia without obstruction or gangrene: Secondary | ICD-10-CM | POA: Diagnosis not present

## 2019-06-12 DIAGNOSIS — T7840XD Allergy, unspecified, subsequent encounter: Secondary | ICD-10-CM

## 2019-06-12 DIAGNOSIS — Z8711 Personal history of peptic ulcer disease: Secondary | ICD-10-CM

## 2019-06-12 NOTE — Patient Instructions (Addendum)
Due to recent COVID-19 restrictions implemented by our local and state authorities and in an effort to keep both patients and staff as safe as possible, our hospital system now requires COVID-19 testing prior to any scheduled hospital procedure. Please go to our [facility] location drive thru testing site ([address] [place], [place] [address]) on 08/08/19 at  10:30am. There will be multiple testing areas, the first checkpoint being for pre-procedure/surgery testing. Get into the right (yellow) lane that leads to the PAT testing team. You will not be billed at the time of testing but may receive a bill later depending on your insurance. The approximate cost of the test is $100. You must agree to quarantine from the time of your testing until the procedure date on 08/13/19 . This should include staying at home with ONLY the people you live with. Avoid take-out, grocery store shopping or leaving the house for any non-emergent reason. Failure to have your COVID-19 test done on the date and time you have been scheduled will result in cancellation of procedure. Please call our office at [phone] if you have any questions.     You have been scheduled for an esophageal manometry test at [facility] on 08/13/19 at 8:30am. Please arrive 30 minutes prior to your procedure for registration. You will need to go to outpatient registration (1st floor of the hospital) first. Make certain to bring your insurance cards as well as a complete list of medications.  Please remember the following:  1) Do not take any muscle relaxants, xanax (alprazolam) or ativan for 1 day prior to your test as well as the day of the test.  2) Nothing to eat or drink after 12:00 midnight on the night before your test.  3) Hold all diabetic medications/insulin the morning of the test. You may eat and take your medications after the test.  It will take at least 2 weeks to receive the results of this test from your  physician.  ------------------------------------------ ABOUT ESOPHAGEAL MANOMETRY Esophageal manometry (muh-NOM-uh-tree) is a test that gauges how well your esophagus works. Your esophagus is the long, muscular tube that connects your throat to your stomach. Esophageal manometry measures the rhythmic muscle contractions (peristalsis) that occur in your esophagus when you swallow. Esophageal manometry also measures the coordination and force exerted by the muscles of your esophagus.  During esophageal manometry, a thin, flexible tube (catheter) that contains sensors is passed through your nose, down your esophagus and into your stomach. Esophageal manometry can be helpful in diagnosing some mostly uncommon disorders that affect your esophagus.  Why it's done Esophageal manometry is used to evaluate the movement (motility) of food through the esophagus and into the stomach. The test measures how well the circular bands of muscle (sphincters) at the top and bottom of your esophagus open and close, as well as the pressure, strength and pattern of the wave of esophageal muscle contractions that moves food along.  What you can expect Esophageal manometry is an outpatient procedure done without sedation. Most people tolerate it well. You may be asked to change into a hospital gown before the test starts.  During esophageal manometry  . While you are sitting up, a member of your health care team sprays your throat with a numbing medication or puts numbing gel in your nose or both.  . A catheter is guided through your nose into your esophagus. The catheter may be sheathed in a water-filled sleeve. It doesn't interfere with your  breathing. However, your eyes may water, and you may gag. You may have a slight nosebleed from irritation.  . After the catheter is in place, you may be asked to lie on your back on an exam table, or you may be asked to remain seated.  . You then swallow small sips of water. As you do, a  computer connected to the catheter records the pressure, strength and pattern of your esophageal muscle contractions.  . During the test, you'll be asked to breathe slowly and smoothly, remain as still as possible, and swallow only when you're asked to do so.  . A member of your health care team may move the catheter down into your stomach while the catheter continues its measurements.  . The catheter then is slowly withdrawn. The test usually lasts 20 to 30 minutes.  After esophageal manometry  When your esophageal manometry is complete, you may return to your normal activities  This test typically takes 30-45 minutes to complete. ________________________________________________________________________________  Continue Omeprazole twice daily.   If needed you may add Gaviscon at bedtime for refractory gerd and burning.   You may also add Pepcid 20mg  (over the counter) at bedtime as well.   If you are age 5 or older, your body mass index should be between 23-30. Your Body mass index is 18.25 kg/m. If this is out of the aforementioned range listed, please consider follow up with your Primary Care Provider.  If you are age 57 or younger, your body mass index should be between 19-25. Your Body mass index is 18.25 kg/m. If this is out of the aformentioned range listed, please consider follow up with your Primary Care Provider.    Due to recent changes in healthcare laws, you may see the results of your imaging and laboratory studies on MyChart before your provider has had a chance to review them.  We understand that in some cases there may be results that are confusing or concerning to you. Not all laboratory results come back in the same time frame and the provider may be waiting for multiple results in order to interpret others.  Please give [name] 48 hours in order for your provider to thoroughly review all the results before contacting the office for clarification of your results.   Thank you  for choosing me and [facility].  Dr. Rush Landmark

## 2019-06-12 NOTE — Progress Notes (Signed)
99

## 2019-06-15 ENCOUNTER — Encounter: Payer: Self-pay | Admitting: Gastroenterology

## 2019-06-15 DIAGNOSIS — K449 Diaphragmatic hernia without obstruction or gangrene: Secondary | ICD-10-CM | POA: Insufficient documentation

## 2019-06-15 DIAGNOSIS — T7840XA Allergy, unspecified, initial encounter: Secondary | ICD-10-CM | POA: Insufficient documentation

## 2019-06-15 DIAGNOSIS — R12 Heartburn: Secondary | ICD-10-CM | POA: Insufficient documentation

## 2019-06-15 NOTE — Progress Notes (Signed)
Subjective: Tina Lucas is a 69 y.o. female patient seen today corn(s) right hallux and right 2nd digits, interdigitally, and painful mycotic toenails b/l that are difficult to trim. Pain interferes with ambulation. Aggravating factors include wearing enclosed shoe gear. Pain is relieved with periodic professional debridement.  Patient Active Problem List   Diagnosis Date Noted  . Chest wall discomfort 07/22/2018  . Pyrosis 04/26/2018  . Positive colorectal cancer screening using Cologuard test 04/26/2018  . Gastric ulcer without hemorrhage or perforation 04/26/2018  . Abnormal findings on esophagogastroduodenoscopy (EGD) 04/26/2018  . Atypical chest pain 02/26/2018  . Asthma-COPD overlap syndrome ([place]) 01/31/2018  . Chronic neck pain 08/27/2017  . GERD (gastroesophageal reflux disease) 04/20/2016  . Numbness 08/06/2015  . Osteopenia 12/10/2014  . Allergic urticaria 12/10/2014  . Pressure in head 04/01/2014  . Dizziness 02/10/2014  . Elevated blood pressure reading 11/06/2013  . Hypothyroid   . Primary HSV infection of mouth     Current Outpatient Medications on File Prior to Visit  Medication Sig Dispense Refill  . Calcium Carbonate-Vitamin D (CALTRATE 600+D) 600-400 MG-UNIT per tablet Take 1 tablet by mouth daily.    . celecoxib (CELEBREX) 100 MG capsule TAKE ONE CAPSULE BY MOUTH TWICE A DAY (Patient taking differently: Take 100 mg by mouth daily. ) 60 capsule 2  . cholecalciferol (VITAMIN D) 400 UNITS TABS tablet Take 400 Units by mouth.    . fexofenadine (ALLEGRA) 180 MG tablet Take 180 mg by mouth daily.    . fluocinonide cream (LIDEX) [id number] % Apply 1 application topically 2 (two) times daily as needed (FOR PSORIASIS).     Marland Kitchen levalbuterol (XOPENEX HFA) 45 MCG/ACT inhaler Inhale 1-2 puffs into the lungs every 4 (four) hours as needed for wheezing. (Patient taking differently: Inhale 1-2 puffs into the lungs every 4 (four) hours as needed for wheezing. Pt takes 1-2 times a week) 1  Inhaler 6  . levothyroxine (SYNTHROID) 50 MCG tablet Take 1 tablet (50 mcg total) by mouth daily with breakfast. 90 tablet 3  . Magnesium 250 MG TABS Take 125 mg by mouth daily.    . mometasone-formoterol (DULERA) 200-5 MCG/ACT AERO Inhale 2 puffs into the lungs 2 (two) times daily. Appt needed for future refills. 13 g 11  . Multiple Vitamins-Minerals (ONE-A-DAY WOMENS 50+ ADVANTAGE PO) Take by mouth daily.    Marland Kitchen omeprazole (PRILOSEC) 40 MG capsule Take 1 capsule (40 mg total) by mouth 2 (two) times daily before a meal. 180 capsule 1  . prednisoLONE acetate (PRED FORTE) 1 % ophthalmic suspension Place 1 drop into the right eye daily. Pt takes 2 times per week     No current facility-administered medications on file prior to visit.    Allergies  Allergen Reactions  . Singulair [Montelukast Sodium] Other (See Comments)    headaches    Objective: Physical Exam  General: Tina Lucas is a pleasant 69 y.o.  Caucasian female,wD, WN in NAD. AAO x 3.   Vascular:  Capillary refill time to digits immediate b/l. Palpable DP pulses b/l. Palpable PT pulses b/l. Pedal hair present b/l. Skin temperature gradient within normal limits b/l. No edema noted b/l.  Dermatological:  Pedal skin with normal turgor, texture and tone bilaterally. No open wounds bilaterally. No interdigital macerations bilaterally. Toenails 1-5 b/l elongated, dystrophic, thickened, crumbly with subungual debris and tenderness to dorsal palpation. Porokeratotic lesion(s) R hallux and R 2nd toe. No erythema, no edema, no drainage, no flocculence.  Musculoskeletal:  Normal muscle strength 5/5 to  all lower extremity muscle groups bilaterally. No gross bony deformities bilaterally. No pain crepitus or joint limitation noted with ROM b/l.  Neurological:  Protective sensation intact 5/5 intact bilaterally with 10g monofilament b/l. Vibratory sensation intact b/l.  Assessment and Plan:  1. Pain due to onychomycosis of toenails of both  feet   2. Porokeratosis   3. Pain in right toe(s)    -Examined patient. -Toenails 1-5 b/l were debrided in length and girth with sterile nail nippers and dremel without iatrogenic bleeding.  -Corn(s) R hallux and R 2nd toe pared utilizing sterile scalpel blade without complication or incident. Total number debrided=2. -Patient to continue soft, supportive shoe gear daily. -Patient to report any pedal injuries to medical professional immediately. -Dispensed Silipos toe separator for daily protection of interdigital corns. Apply every morning. Remove every evening. -Patient/POA to call should there be question/concern in the interim.  Return in about 4 months (around 10/10/2019) for nail and callus trim.  Marzetta Board, DPM

## 2019-06-15 NOTE — Progress Notes (Signed)
[facility] VISIT   Primary Care Provider Binnie Rail, MD [address] [place] [address] [phone]  Patient Profile: Tina Lucas is a 69 y.o. female with a pmh significant for COPD, hypothyroidism, allergic rhinitis, arthralgias, PUD (GU manifestion previously but now improved), some microscopic acid reflux changes based on pathology at time of previous EGD, ?Hypersensitive Esophagus v Functional Heartburn.  The patient presents to the [facility] for an evaluation and management of problem(s) noted below:  Problem List 1. Gastroesophageal reflux disease without esophagitis   2. Functional heartburn   3. Esophageal hypersensitivity   4. History of gastric ulcer   5. Hiatal hernia     History of Present Illness: Please see initial consultation note and progress notes for full details of HPI.    Interval History I have not seen the patient since the spring 2020.  My partner Dr. Henrene Pastor helped with her upper and lower endoscopy in June 2020 with results as below.  Her previous ulcer had healed and the previous esophageal abnormalities were no longer present.  Was seen by her primary care doctor earlier this year with reinitiation of omeprazole due to recurrent pyrosis as well as belching and burping.  After the last 6 to 8 weeks of being on twice daily omeprazole her burning is slightly better and she is now had 24 hours without burning.  She did not know if things were going to be worse after any episode of eating or drinking.  With that being said, she is happy about how things are today but she is concerned and worried that things will recur.  There is no odynophagia or dysphagia.  She is not taking significant nonsteroidals.  She continues on her inhalers for her COPD diagnosis.  She is hoping that with warmer weather and things improving that she will get back to her pickleball.  GI Review of Systems Positive as above Negative  for pain, nausea, vomiting, bloating, change in bowel habits, melena, hematochezia  Review of Systems General: Denies fevers/chills/weight loss unintentionally HEENT: Denies oral lesions Cardiovascular: Denies exertional chest pain Pulmonary: Denies shortness of breath Gastroenterological: See HPI Genitourinary: Denies darkened urine Hematological: Denies easy bruising Dermatological: Denies jaundice Psychological: Mood is stable though level of some anxiety is noted because of her symptoms and her good health normally that she just wants to be feeling better   Medications Current Outpatient Medications  Medication Sig Dispense Refill  . Calcium Carbonate-Vitamin D (CALTRATE 600+D) 600-400 MG-UNIT per tablet Take 1 tablet by mouth daily.    . celecoxib (CELEBREX) 100 MG capsule TAKE ONE CAPSULE BY MOUTH TWICE A DAY (Patient taking differently: Take 100 mg by mouth daily. ) 60 capsule 2  . cholecalciferol (VITAMIN D) 400 UNITS TABS tablet Take 400 Units by mouth.    . fexofenadine (ALLEGRA) 180 MG tablet Take 180 mg by mouth daily.    . fluocinonide cream (LIDEX) [id number] % Apply 1 application topically 2 (two) times daily as needed (FOR PSORIASIS).     Marland Kitchen levalbuterol (XOPENEX HFA) 45 MCG/ACT inhaler Inhale 1-2 puffs into the lungs every 4 (four) hours as needed for wheezing. (Patient taking differently: Inhale 1-2 puffs into the lungs every 4 (four) hours as needed for wheezing. Pt takes 1-2 times a week) 1 Inhaler 6  . levothyroxine (SYNTHROID) 50 MCG tablet Take 1 tablet (50 mcg total) by mouth daily with breakfast. 90 tablet 3  . Magnesium 250 MG TABS Take 125 mg by  mouth daily.    . mometasone-formoterol (DULERA) 200-5 MCG/ACT AERO Inhale 2 puffs into the lungs 2 (two) times daily. Appt needed for future refills. 13 g 11  . Multiple Vitamins-Minerals (ONE-A-DAY WOMENS 50+ ADVANTAGE PO) Take by mouth daily.    Marland Kitchen omeprazole (PRILOSEC) 40 MG capsule Take 1 capsule (40 mg total) by mouth 2  (two) times daily before a meal. 180 capsule 1  . prednisoLONE acetate (PRED FORTE) 1 % ophthalmic suspension Place 1 drop into the right eye daily. Pt takes 2 times per week     No current facility-administered medications for this visit.    Allergies Allergies  Allergen Reactions  . Singulair [Montelukast Sodium] Other (See Comments)    headaches    Histories Past Medical History:  Diagnosis Date  . Allergic rhinitis   . Allergy   . Asthma    as a child  . 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 COPD (chronic obstructive pulmonary disease) ([place])   . History of asthma 1956-2000  . Hypothyroid 2013  . Osteopenia   . Primary HSV infection of mouth 2008  . Pulmonary nodule   . 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 COLONOSCOPY    . polyp on vocal chord     . TONSILLECTOMY  1958   Social History   Socioeconomic History  . Marital status: Married    Spouse name: Not on file  . Number of children: Not on file  . Years of education: Not on file  . Highest education level: Not on file  Occupational History  . Not on file  Tobacco Use  . Smoking status: Never Smoker  . Smokeless tobacco: Never Used  Substance and Sexual Activity  . Alcohol use: Not Currently    Alcohol/week: 0.0 standard drinks  . Drug use: No  . Sexual activity: Not on file  Other Topics Concern  . Not on file  Social History Narrative   Lives with husband in a one story home.  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   Social Determinants of Health   Financial Resource Strain:   . Difficulty of Paying Living Expenses:   Food Insecurity:   . Worried About Charity fundraiser in the Last Year:   . Arboriculturist in the Last Year:   Transportation Needs:   . Film/video editor (Medical):   Marland Kitchen Lack of Transportation (Non-Medical):    Physical Activity:   . Days of Exercise per Week:   . Minutes of Exercise per Session:   Stress:   . Feeling of Stress :   Social Connections:   . Frequency of Communication with Friends and Family:   . Frequency of Social Gatherings with Friends and Family:   . Attends Religious Services:   . Active Member of Clubs or Organizations:   . Attends Archivist Meetings:   Marland Kitchen Marital Status:   Intimate Partner Violence:   . Fear of Current or Ex-Partner:   . Emotionally Abused:   Marland Kitchen Physically Abused:   . Sexually Abused:    Family History  Problem Relation Age of Onset  . Breast cancer Mother 65  . Osteoarthritis Father   . Hypertension Father   . Leukemia Maternal Aunt 3  . Atrial fibrillation Brother   . Colon cancer Neg Hx   . Esophageal  cancer Neg Hx   . Inflammatory bowel disease Neg Hx   . Liver disease Neg Hx   . Pancreatic cancer Neg Hx   . Rectal cancer Neg Hx   . Stomach cancer Neg Hx    I have reviewed her medical, social, and family history in detail and updated the electronic medical record as necessary.    PHYSICAL EXAMINATION  BP 140/80   Pulse 76   Ht 5\' 8"  (1.727 m)   Wt 120 lb (54.4 kg)   BMI 18.25 kg/m  GEN: NAD, appears stated age, doesn't appear chronically ill, accompanied by husband PSYCH: Cooperative, without pressured speech EYE: Conjunctivae pink, sclerae anicteric ENT: MMM CV: Nontachycardic RESP: No audible wheezing GI: NT/ND MSK/EXT: No significant lower extremity edema SKIN: No jaundice NEURO:  Alert & Oriented x 3, no focal deficits   REVIEW OF DATA  I reviewed the following data at the time of this encounter:  GI Procedures and Studies  June 2020 colonoscopy - The examined portion of the ileum was normal. - One 2 mm polyp in the descending colon, removed with a cold snare. Resected and retrieved. - The examination was otherwise normal on direct and retroflexion views.  June 2020 EGD - The esophagus was normal. No  residual abnormalities at previous nodule resection site. - The stomach was normal save a few small benign fundic gland type polyps and small hiatal hernia. Previous area of ulceration has healed. - The examined duodenum was normal. - The cardia and gastric fundus were normal on retroflexion.  Laboratory Studies  Reviewed in epic   04/06/2018 00:00  Cologuard Positive (A)   Imaging Studies  No new relevant studies to review   ASSESSMENT  Ms. Tuley is a 69 y.o. female with a pmh significant for COPD, hypothyroidism, allergic rhinitis, arthralgias, PUD (GU manifestion previously but now improved), some microscopic acid reflux changes based on pathology at time of previous EGD, ?Hypersensitive Esophagus v Functional Heartburn.  The patient is seen today for evaluation and management of:  1. Gastroesophageal reflux disease without esophagitis   2. Functional heartburn   3. Esophageal hypersensitivity   4. History of gastric ulcer   5. Hiatal hernia    The patient is hemodynamically stable.  Patient's work-up at this point in time has shown evidence of previous microscopic GERD but endoscopic GERD has not been significantly present.  With that being said, she has a significant component of pyrosis as well as episodes of belching.  I am most concerned that the patient has either functional heartburn or hypersensitive esophagus.  She has been on adequate PPI therapy with 2 different PPIs and is currently on 40 mg twice daily.  Not clear to me that her small hiatal hernia should be causing her significant issues but we have asked the patient to consider Gaviscon if needed for breakthrough symptoms as there is some data that this can be helpful for acid pockets in patients who have hiatal hernias.  I would normally recommend moving forward with pH impedance testing and esophageal manometry as a way/mechanism to rule out other etiologies for her symptoms.  We briefly discussed the consideration of TCA  use for functional GI disorder such as hypersensitive esophagus or functional heartburn.  I am holding on baclofen due to its potential side effect profile and tachyphylaxis.,  After hearing this, as well as her husband do think that she may be that particular person who could have been esophageal hypersensitivity.  We are going to  hold on her studies for now although will place a request to have them done in approximately 6 weeks.  If she is doing well she will maintain her twice daily PPI and we will cancel her pH impedance and esophageal manometry.  If not, she will let us know and we will move forward with the planned procedures.  She will maintain her PPI twice daily for now anyways.  If symptoms of significant pyrosis recur then she may trial Gaviscon 1-2 times daily and she may also utilize an H2 RA at night before bedtime.  I certainly can entertain a repeat endoscopy, although it is not clear we would find something to significantly but we can always do that to ensure we are not missing anything else although she will have had 2 endoscopies within a year and a half.  All patient questions were answered, to the best of my ability, and the patient agrees to the aforementioned plan of action with follow-up as indicated.   PLAN  Okay to continue omeprazole 40 mg twice daily If progressive symptoms then consider Gaviscon 1-2 times daily for Pepcid 20 mg 1-2 times daily pH impedance testing on PPI & Esophageal manometry to rule out spasms/IEM in 4 to 6 weeks but can cancel if she is doing well Discussed possible TCA use but deferred by patient currently   Orders Placed This Encounter  Procedures  . Procedural/ Surgical Case Request: ESOPHAGEAL MANOMETRY (EM), 24 HOUR PH STUDY- impedence  . Ambulatory referral to Gastroenterology    New Prescriptions   No medications on file   Modified Medications   No medications on file    Planned Follow Up: No follow-ups on file.   Total Time in  Face-to-Face and in Coordination of Care for patient including independent/personal interpretation/review of prior testing, medical history, examination, medication adjustment, communicating results with the patient directly, and documentation with the EHR is 30 minutes.   Justice Britain, MD [facility] # [phone]

## 2019-06-25 DIAGNOSIS — D1801 Hemangioma of skin and subcutaneous tissue: Secondary | ICD-10-CM | POA: Diagnosis not present

## 2019-06-25 DIAGNOSIS — L2089 Other atopic dermatitis: Secondary | ICD-10-CM | POA: Diagnosis not present

## 2019-06-25 DIAGNOSIS — D2271 Melanocytic nevi of right lower limb, including hip: Secondary | ICD-10-CM | POA: Diagnosis not present

## 2019-06-25 DIAGNOSIS — L218 Other seborrheic dermatitis: Secondary | ICD-10-CM | POA: Diagnosis not present

## 2019-06-25 DIAGNOSIS — L821 Other seborrheic keratosis: Secondary | ICD-10-CM | POA: Diagnosis not present

## 2019-06-25 DIAGNOSIS — D1721 Benign lipomatous neoplasm of skin and subcutaneous tissue of right arm: Secondary | ICD-10-CM | POA: Diagnosis not present

## 2019-06-25 DIAGNOSIS — D692 Other nonthrombocytopenic purpura: Secondary | ICD-10-CM | POA: Diagnosis not present

## 2019-07-14 NOTE — Progress Notes (Signed)
Subjective:    Patient ID: Tina Lucas, female    DOB: [birth year], 69 y.o.   MRN: [id number]  HPI The patient is here for an acute visit.  Back pain:  It started early May after walking (40-60 min).  It was her upper back and it started after her walk.  She used ice, voltaren gel and Aspercreme.  It has gotten worse. If she does not exercise one day her back still hurts. Activity makes it worse.    Today she feels very SOB.  She used the inhaler.  Yesterday she went to the gym.  The humidity make her SOB.      Still having GERD symptoms 1-2 times a week- taking medication.  It is not the burning, but it still hurts.  Has esophageal manometry scheduled for July 20th.  She has a lot of burping.    She is trying to cut back on the celebrex.    Medications and allergies reviewed with patient and updated if appropriate.  Patient Active Problem List   Diagnosis Date Noted  . Hiatal hernia 06/15/2019  . Functional heartburn 06/15/2019  . Hypersensitivity 06/15/2019  . Chest wall discomfort 07/22/2018  . Pyrosis 04/26/2018  . Positive colorectal cancer screening using Cologuard test 04/26/2018  . Gastric ulcer without hemorrhage or perforation 04/26/2018  . Abnormal findings on esophagogastroduodenoscopy (EGD) 04/26/2018  . Atypical chest pain 02/26/2018  . Asthma-COPD overlap syndrome ([place]) 01/31/2018  . Chronic neck pain 08/27/2017  . GERD (gastroesophageal reflux disease) 04/20/2016  . Numbness 08/06/2015  . Osteopenia 12/10/2014  . Allergic urticaria 12/10/2014  . Pressure in head 04/01/2014  . Dizziness 02/10/2014  . Elevated blood pressure reading 11/06/2013  . Hypothyroid   . Primary HSV infection of mouth     Current Outpatient Medications on File Prior to Visit  Medication Sig Dispense Refill  . Calcium Carbonate-Vitamin D (CALTRATE 600+D) 600-400 MG-UNIT per tablet Take 1 tablet by mouth daily.    . celecoxib (CELEBREX) 100 MG capsule TAKE ONE CAPSULE BY MOUTH TWICE  A DAY (Patient taking differently: Take 100 mg by mouth daily. ) 60 capsule 2  . cholecalciferol (VITAMIN D) 400 UNITS TABS tablet Take 400 Units by mouth.    . fexofenadine (ALLEGRA) 180 MG tablet Take 180 mg by mouth daily.    . fluocinonide cream (LIDEX) 0.38 % Apply 1 application topically 2 (two) times daily as needed (FOR PSORIASIS).     . hydrocortisone 2.5 % cream     . levalbuterol (XOPENEX HFA) 45 MCG/ACT inhaler Inhale 1-2 puffs into the lungs every 4 (four) hours as needed for wheezing. (Patient taking differently: Inhale 1-2 puffs into the lungs every 4 (four) hours as needed for wheezing. Pt takes 1-2 times a week) 1 Inhaler 6  . levothyroxine (SYNTHROID) 50 MCG tablet Take 1 tablet (50 mcg total) by mouth daily with breakfast. 90 tablet 3  . Magnesium 250 MG TABS Take 125 mg by mouth daily.    . mometasone-formoterol (DULERA) 200-5 MCG/ACT AERO Inhale 2 puffs into the lungs 2 (two) times daily. Appt needed for future refills. 13 g 11  . Multiple Vitamins-Minerals (ONE-A-DAY WOMENS 50+ ADVANTAGE PO) Take by mouth daily.    Marland Kitchen omeprazole (PRILOSEC) 40 MG capsule Take 1 capsule (40 mg total) by mouth 2 (two) times daily before a meal. 180 capsule 1  . prednisoLONE acetate (PRED FORTE) 1 % ophthalmic suspension Place 1 drop into the right eye daily. Pt takes 2 times  per week    . triamcinolone (KENALOG) 0.1 % paste      No current facility-administered medications on file prior to visit.    Past Medical History:  Diagnosis Date  . Allergic rhinitis   . Allergy   . Asthma    as a child  . 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 COPD (chronic obstructive pulmonary disease) ([place])   . History of asthma 1956-2000  . Hypothyroid 2013  . Osteopenia   . Primary HSV infection of mouth 2008  . Pulmonary nodule   . Sunlight-induced angio-edema-urticaria 07/11/2011   unknown trigger, neg autoimmune workup    Past Surgical History:  Procedure  Laterality Date  . COLONOSCOPY    . polyp on vocal chord     . TONSILLECTOMY  1958    Social History   Socioeconomic History  . Marital status: Married    Spouse name: Not on file  . Number of children: Not on file  . Years of education: Not on file  . Highest education level: Not on file  Occupational History  . Not on file  Tobacco Use  . Smoking status: Never Smoker  . Smokeless tobacco: Never Used  Vaping Use  . Vaping Use: Never used  Substance and Sexual Activity  . Alcohol use: Not Currently    Alcohol/week: 0.0 standard drinks  . Drug use: No  . Sexual activity: Not on file  Other Topics Concern  . Not on file  Social History Narrative   Lives with husband in a one story home.  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   Social Determinants of Health   Financial Resource Strain:   . Difficulty of Paying Living Expenses:   Food Insecurity:   . Worried About Charity fundraiser in the Last Year:   . Arboriculturist in the Last Year:   Transportation Needs:   . Film/video editor (Medical):   Marland Kitchen Lack of Transportation (Non-Medical):   Physical Activity:   . Days of Exercise per Week:   . Minutes of Exercise per Session:   Stress:   . Feeling of Stress :   Social Connections:   . Frequency of Communication with Friends and Family:   . Frequency of Social Gatherings with Friends and Family:   . Attends Religious Services:   . Active Member of Clubs or Organizations:   . Attends Archivist Meetings:   Marland Kitchen Marital Status:     Family History  Problem Relation Age of Onset  . Breast cancer Mother 27  . Osteoarthritis Father   . Hypertension Father   . Leukemia Maternal Aunt 60  . Atrial fibrillation Brother   . Colon cancer Neg Hx   . Esophageal cancer Neg Hx   . Inflammatory bowel disease Neg Hx   . Liver disease Neg Hx   . Pancreatic cancer Neg Hx   . Rectal cancer Neg Hx   . Stomach cancer Neg Hx     Review of Systems    Constitutional: Negative for fever.  HENT: Positive for trouble swallowing.   Respiratory: Positive for shortness of breath. Negative for cough and wheezing.   Cardiovascular: Positive for chest pain (from gerd).  Gastrointestinal:       GERD, burping  Musculoskeletal: Positive for back pain.       Objective:   Vitals:   07/15/19 1417  BP: 138/78  Pulse: 80  Temp: 98 F (36.7 C)  SpO2: 99%   BP Readings from Last 3 Encounters:  07/15/19 138/78  06/12/19 140/80  05/19/19 (!) 178/68   Wt Readings from Last 3 Encounters:  07/15/19 118 lb (53.5 kg)  06/12/19 120 lb (54.4 kg)  05/19/19 120 lb 6.4 oz (54.6 kg)   Body mass index is 17.94 kg/m.   Physical Exam    Constitutional: Appears well-developed and well-nourished. No distress.  Head: Normocephalic and atraumatic.  Neck: Neck supple. No tracheal deviation present. No thyromegaly present.  No cervical lymphadenopathy Cardiovascular: Normal rate, regular rhythm and normal heart sounds.  No murmur heard. No carotid bruit .  No edema Pulmonary/Chest: Effort normal and breath sounds normal. No respiratory distress. No has no wheezes. No rales.  Back: tenderness left side of spine Skin: Skin is warm and dry. Not diaphoretic.  Psychiatric: Normal mood and affect. Behavior is normal.       Assessment & Plan:   > 20 minutes were spent face-to-face with the patient going over  Her current symptoms and discussing treatment options and plan for addressing these issues.  Her gerd and back pain are most bothersome and anxiety provoking   See Problem List for Assessment and Plan of chronic medical problems.    This visit occurred during the SARS-CoV-2 public health emergency.  Safety protocols were in place, including screening questions prior to the visit, additional usage of staff PPE, and extensive cleaning of exam room while observing appropriate contact time as indicated for disinfecting solutions.

## 2019-07-15 ENCOUNTER — Encounter: Payer: Self-pay | Admitting: Internal Medicine

## 2019-07-15 ENCOUNTER — Other Ambulatory Visit: Payer: Self-pay

## 2019-07-15 ENCOUNTER — Ambulatory Visit: Payer: Medicare PPO | Admitting: Internal Medicine

## 2019-07-15 VITALS — BP 138/78 | HR 80 | Temp 98.0°F | Ht 68.0 in | Wt 118.0 lb

## 2019-07-15 DIAGNOSIS — R42 Dizziness and giddiness: Secondary | ICD-10-CM | POA: Diagnosis not present

## 2019-07-15 DIAGNOSIS — K219 Gastro-esophageal reflux disease without esophagitis: Secondary | ICD-10-CM | POA: Diagnosis not present

## 2019-07-15 DIAGNOSIS — M549 Dorsalgia, unspecified: Secondary | ICD-10-CM

## 2019-07-15 NOTE — Assessment & Plan Note (Signed)
chronic Still having GERD symptoms, which is very concerning to her H/o gastric ulcer and gastritis On PPI 2/ day Has esoph manometry next month She is doing everything she should be doing Keep celebrex to a minimum

## 2019-07-15 NOTE — Assessment & Plan Note (Signed)
Acute  - started early may Sounds muscular in nature - possibly related to scoliosis Refer to PT Ice, aspercreme

## 2019-07-15 NOTE — Patient Instructions (Addendum)
   Medications reviewed and updated.  Changes include :   None.  Try to take the celebrex the least amount possible.  Do not take motrin/ibuprofen or aleve.    A referral was ordered for PT with [facility].       Someone will call you to schedule this.

## 2019-07-15 NOTE — Assessment & Plan Note (Signed)
Chronic, intermittent Referred to PT

## 2019-07-17 ENCOUNTER — Encounter: Payer: Self-pay | Admitting: Physical Therapy

## 2019-07-17 ENCOUNTER — Ambulatory Visit: Payer: Medicare PPO | Attending: Internal Medicine | Admitting: Physical Therapy

## 2019-07-17 ENCOUNTER — Other Ambulatory Visit: Payer: Self-pay

## 2019-07-17 DIAGNOSIS — M542 Cervicalgia: Secondary | ICD-10-CM

## 2019-07-17 DIAGNOSIS — M62838 Other muscle spasm: Secondary | ICD-10-CM | POA: Diagnosis not present

## 2019-07-17 NOTE — Therapy (Signed)
[facility] [address] [place], [place], [address] Phone: [phone]   Fax:  [phone]  Physical Therapy Evaluation  Patient Details  Name: Tina Lucas MRN: [id number] Date of Birth: 11-28-1950 Referring Provider (PT): Binnie Rail, MD   Encounter Date: 07/17/2019   PT End of Session - 07/17/19 1621    Visit Number 1    Date for PT Re-Evaluation 10/09/19    PT Start Time 1611    PT Stop Time 1700    PT Time Calculation (min) 49 min    Activity Tolerance Patient tolerated treatment well    Behavior During Therapy [facility] for tasks assessed/performed           Past Medical History:  Diagnosis Date  . Allergic rhinitis   . Allergy   . Asthma    as a child  . 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 COPD (chronic obstructive pulmonary disease) ([place])   . History of asthma 1956-2000  . Hypothyroid 2013  . Osteopenia   . Primary HSV infection of mouth 2008  . Pulmonary nodule   . 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 COLONOSCOPY    . polyp on vocal chord     . TONSILLECTOMY  1958    There were no vitals filed for this visit.    Subjective Assessment - 07/17/19 1626    Subjective Pt got tested for osteoporosis and is positive with femoral neck being the most significant.  Pt has been having upper back pain that began May this year.  States it will hurt when walking    Pertinent History osteoporosis, COPD, esophogeal spasms (can cause chest pains)    Limitations Walking;Other (comment)    How long can you walk comfortably? >40-60 minutes    Patient Stated Goals reduce shoulder pain so she can walk, work out and maybe play pickle ball    Currently in Pain? Yes    Pain Score 7     Pain Location Back    Pain Orientation Upper;Mid    Pain Descriptors /  Indicators Sharp    Pain Type Chronic pain    Pain Radiating Towards across upper back    Pain Frequency Intermittent    Aggravating Factors  walking or sometimes comes out of nowhere unsure, wakes in the night sometimes    Pain Relieving Factors asper cream, ice    Effect of Pain on Daily Activities walking, playing pickle    Multiple Pain Sites No              [facility] PT Assessment - 07/17/19 0001      Assessment   Medical Diagnosis M54.9 (ICD-10-CM) - Upper back pain on left side; vertigo    Referring Provider (PT) Binnie Rail, MD    Onset Date/Surgical Date --   May 2021   Prior Therapy Not this year      Precautions   Precautions None      Balance Screen   Has the patient fallen in the past 6 months No      [place] residence    Living Arrangements Spouse/significant other      Prior Function   Level of Independence Independent    Vocation Retired      Associate Professor   Overall Cognitive Status Within Functional Limits  for tasks assessed      Observation/Other Assessments   Focus on Therapeutic Outcomes (FOTO)  48% limited      Posture/Postural Control   Posture/Postural Control Postural limitations    Postural Limitations Rounded Shoulders;Forward head;Increased thoracic kyphosis   scoliosis     ROM / Strength   AROM / PROM / Strength AROM;Strength      AROM   AROM Assessment Site Cervical    Cervical - Right Side Bend 30    Cervical - Left Side Bend 40    Cervical - Right Rotation 55    Cervical - Left Rotation 65      Strength   Overall Strength Comments shoulder bilat 5/5 +pain      Palpation   Palpation comment thoracic hypomobility throughout very TTP T1-3; cervical paraspinals tight and tender and feel taught bands throughout      Special Tests    Special Tests Cervical    Cervical Tests Spurling's;Dictraction      Spurling's   Findings Negative      Distraction Test   Findngs Negative       Ambulation/Gait   Gait Pattern Within Functional Limits                      Objective measurements completed on examination: See above findings.                 PT Short Term Goals - 07/17/19 1723      PT SHORT TERM GOAL #1   Title pt will be able to make it through 2 weeks of normal activity with at most one episode of pain in the upper back    Time 4    Period Weeks    Status New    Target Date 08/14/19      PT SHORT TERM GOAL #2   Title .Marland Kitchen      PT SHORT TERM GOAL #3   Title .Marland Kitchen             PT Long Term Goals - 07/17/19 1720      PT LONG TERM GOAL #1   Title pt will be ind with advanced HEP    Time 12    Period Weeks    Status New    Target Date 10/09/19      PT LONG TERM GOAL #2   Title Pt will report 50% less pain with working out at the gym    Time 12    Period Weeks    Status New    Target Date 10/09/19      PT LONG TERM GOAL #3   Title FOTO < or = to 38% limited    Baseline 49%    Time 12    Period Weeks    Status New    Target Date 10/09/19      PT LONG TERM GOAL #4   Title pt demonstrates improved cervical rotation to 60 deg bilaterally for improved ability to navigate traffic while driving    Baseline 55 deg right; 65 left    Time 12    Period Weeks    Status New    Target Date 10/09/19      PT LONG TERM GOAL #5   Title Pt will report 50% less muscle spasms in her neck so she can tolerate going for walks at least 3x/week for 45 minutes or more without pain    Baseline sometimes only 30 minutes  with pain    Time 12    Period Weeks    Status New    Target Date 10/09/19                  Plan - 07/17/19 1724    Clinical Impression Statement Pt presents to skilled PT due to upper back and neck pain.  Pt demostrates posture abnormality, vertebrea hypomobilty and pain.  Pt also recently diagnosed with osteopororis. Pt will benefit from skilled PT in order to improve abilty to perform normal strength routine for  osteoporosis prevention of further worsening and for return to healthy lifestyle.    Personal Factors and Comorbidities Comorbidity 3+;Time since onset of injury/illness/exacerbation    Comorbidities osteoporosis, COPD, esophogeal spasms (can cause chest pains    Examination-Participation Restrictions Community Activity    Stability/Clinical Decision Making Evolving/Moderate complexity    Clinical Decision Making Low    Rehab Potential Excellent    PT Frequency 1x / week    PT Duration 12 weeks    PT Treatment/Interventions ADLs/Self Care Home Management;Biofeedback;Cryotherapy;Electrical Stimulation;Moist Heat;Therapeutic activities;Therapeutic exercise;Neuromuscular re-education;Patient/family education;Manual techniques;Passive range of motion;Taping;Dry needling    PT Next Visit Plan MFR to cervical paraspinals, thoracic paraspinals, grade I PA mobs C3-T3, review gym exercises add chops and trunk rotation with band    Consulted and Agree with Plan of Care Patient           Patient will benefit from skilled therapeutic intervention in order to improve the following deficits and impairments:  Increased fascial restricitons, Decreased range of motion, Decreased strength, Postural dysfunction, Pain, Difficulty walking  Visit Diagnosis: Other muscle spasm  Cervicalgia     Problem List Patient Active Problem List   Diagnosis Date Noted  . Upper back pain on left side 07/15/2019  . Hiatal hernia 06/15/2019  . Functional heartburn 06/15/2019  . Hypersensitivity 06/15/2019  . Chest wall discomfort 07/22/2018  . Pyrosis 04/26/2018  . Positive colorectal cancer screening using Cologuard test 04/26/2018  . Gastric ulcer without hemorrhage or perforation 04/26/2018  . Abnormal findings on esophagogastroduodenoscopy (EGD) 04/26/2018  . Atypical chest pain 02/26/2018  . Asthma-COPD overlap syndrome ([place]) 01/31/2018  . Chronic neck pain 08/27/2017  . GERD (gastroesophageal reflux  disease) 04/20/2016  . Numbness 08/06/2015  . Osteopenia 12/10/2014  . Allergic urticaria 12/10/2014  . Pressure in head 04/01/2014  . Vertigo 02/10/2014  . Elevated blood pressure reading 11/06/2013  . Hypothyroid   . Primary HSV infection of mouth     Jule Ser, PT 07/17/2019, 5:30 PM  [facility] [address] [place], [place], [address] Phone: [phone]   Fax:  [phone]  Name: Tina Lucas MRN: [id number] Date of Birth: 28-Jan-1950

## 2019-07-21 ENCOUNTER — Encounter: Payer: Self-pay | Admitting: Physical Therapy

## 2019-07-21 ENCOUNTER — Other Ambulatory Visit: Payer: Self-pay

## 2019-07-21 ENCOUNTER — Ambulatory Visit: Payer: Medicare PPO | Admitting: Physical Therapy

## 2019-07-21 DIAGNOSIS — M542 Cervicalgia: Secondary | ICD-10-CM | POA: Diagnosis not present

## 2019-07-21 DIAGNOSIS — M62838 Other muscle spasm: Secondary | ICD-10-CM | POA: Diagnosis not present

## 2019-07-21 NOTE — Therapy (Signed)
[facility] [address], [place], [place], [address] Phone: [phone]   Fax:  [phone]  Physical Therapy Treatment  Patient Details  Name: Tina Lucas MRN: [id number] Date of Birth: 1950-02-20 Referring Provider (PT): Binnie Rail, MD   Encounter Date: 07/21/2019   PT End of Session - 07/21/19 1439    Visit Number 2    Date for PT Re-Evaluation 10/09/19    PT Start Time 1600    PT Stop Time 1640    PT Time Calculation (min) 40 min           Past Medical History:  Diagnosis Date  . Allergic rhinitis   . Allergy   . Asthma    as a child  . 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 COPD (chronic obstructive pulmonary disease) ([place])   . History of asthma 1956-2000  . Hypothyroid 2013  . Osteopenia   . Primary HSV infection of mouth 2008  . Pulmonary nodule   . 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 COLONOSCOPY    . polyp on vocal chord     . TONSILLECTOMY  1958    There were no vitals filed for this visit.   Subjective Assessment - 07/21/19 1404    Subjective Pt states her issue changes a lot.  She states her neck felt better and then the next day, walked and felt more aching between shoulder blades.  Then another day she woke up and the low back was hurting more on the Lt side    Currently in Pain? Yes    Pain Score 7     Pain Location Back    Pain Orientation Mid;Upper    Pain Descriptors / Indicators Sharp    Pain Type Chronic pain    Pain Onset More than a month ago    Pain Frequency Intermittent    Multiple Pain Sites No                             [facility] Adult PT Treatment/Exercise - 07/21/19 0001      Neuro Re-ed    Neuro Re-ed Details  needs cues throughoughout for posture; single leg standing and has diminished pelvic  movement on Lt SI jont      Exercises   Exercises Lumbar      Lumbar Exercises: Stretches   Other Lumbar Stretch Exercise lumbar and thoracic extension and flexion stretching; rolling ball side to side in seated position      Lumbar Exercises: Standing   Other Standing Lumbar Exercises diagonals with 1plate; yellow band - both ways irritated Rt shoulder and stopped at 5 reps each side                  PT Education - 07/21/19 1442    Education Details Access Code: [email or web address]    Person(s) Educated Patient    Methods Explanation;Demonstration;Verbal cues;Handout    Comprehension Verbalized understanding;Returned demonstration            PT Short Term Goals - 07/17/19 1723      PT SHORT TERM GOAL #1   Title pt will be able to make it through 2 weeks of normal activity with at most one episode of pain in the upper back    Time 4  Period Weeks    Status New    Target Date 08/14/19      PT SHORT TERM GOAL #2   Title .Marland Kitchen      PT SHORT TERM GOAL #3   Title .Marland Kitchen             PT Long Term Goals - 07/17/19 1720      PT LONG TERM GOAL #1   Title pt will be ind with advanced HEP    Time 12    Period Weeks    Status New    Target Date 10/09/19      PT LONG TERM GOAL #2   Title Pt will report 50% less pain with working out at the gym    Time 12    Period Weeks    Status New    Target Date 10/09/19      PT LONG TERM GOAL #3   Title FOTO < or = to 38% limited    Baseline 49%    Time 12    Period Weeks    Status New    Target Date 10/09/19      PT LONG TERM GOAL #4   Title pt demonstrates improved cervical rotation to 60 deg bilaterally for improved ability to navigate traffic while driving    Baseline 55 deg right; 65 left    Time 12    Period Weeks    Status New    Target Date 10/09/19      PT LONG TERM GOAL #5   Title Pt will report 50% less muscle spasms in her neck so she can tolerate going for walks at least 3x/week for 45 minutes or more without  pain    Baseline sometimes only 30 minutes with pain    Time 12    Period Weeks    Status New    Target Date 10/09/19                 Plan - 07/21/19 1556    Clinical Impression Statement Today's session focused on trunk ROM and strength.  Pt was concerned about what exercises she can do that will not flare up her back as she is feeling pain in different areas and not sure what is causing it. Pt was educated on doing light loads.   Pt attempted diagonal exercises that caused some increased pain in her shoulder.  Pt responded well to lumbar stretches on the ball in sitting fwd and side bending.  Pt will benefit from skilled PT to continue strength for return to pickle ball.    PT Treatment/Interventions ADLs/Self Care Home Management;Biofeedback;Cryotherapy;Electrical Stimulation;Moist Heat;Therapeutic activities;Therapeutic exercise;Neuromuscular re-education;Patient/family education;Manual techniques;Passive range of motion;Taping;Dry needling    PT Next Visit Plan MFR to cervical paraspinals, thoracic paraspinals, grade I PA mobs C3-T3, review gym exercises add chops and trunk rotation with band    Consulted and Agree with Plan of Care Patient           Patient will benefit from skilled therapeutic intervention in order to improve the following deficits and impairments:  Increased fascial restricitons, Decreased range of motion, Decreased strength, Postural dysfunction, Pain, Difficulty walking  Visit Diagnosis: Other muscle spasm  Cervicalgia     Problem List Patient Active Problem List   Diagnosis Date Noted  . Upper back pain on left side 07/15/2019  . Hiatal hernia 06/15/2019  . Functional heartburn 06/15/2019  . Hypersensitivity 06/15/2019  . Chest wall discomfort 07/22/2018  . Pyrosis 04/26/2018  .  Positive colorectal cancer screening using Cologuard test 04/26/2018  . Gastric ulcer without hemorrhage or perforation 04/26/2018  . Abnormal findings on  esophagogastroduodenoscopy (EGD) 04/26/2018  . Atypical chest pain 02/26/2018  . Asthma-COPD overlap syndrome ([place]) 01/31/2018  . Chronic neck pain 08/27/2017  . GERD (gastroesophageal reflux disease) 04/20/2016  . Numbness 08/06/2015  . Osteopenia 12/10/2014  . Allergic urticaria 12/10/2014  . Pressure in head 04/01/2014  . Vertigo 02/10/2014  . Elevated blood pressure reading 11/06/2013  . Hypothyroid   . Primary HSV infection of mouth     Jule Ser, PT 07/21/2019, 4:18 PM  [facility] [address], [place], [place], [address] Phone: [phone]   Fax:  [phone]  Name: Tina Lucas MRN: [id number] Date of Birth: 05-15-1950

## 2019-07-21 NOTE — Patient Instructions (Signed)
Access Code: [email or web address] URL: [email or web address] Date: 07/21/2019 Prepared by: Jari Favre  Exercises Seated Thoracic Flexion and Rotation with Arms Crossed - 1 x daily - 7 x weekly - 10 reps - 1 sets - 5 sec hold Seated Lumbar Flexion Stretch - 1 x daily - 7 x weekly - 3 sets - 10 reps Child's Pose with Sidebending - 1 x daily - 7 x weekly - 3 reps - 1 sets - 30 sec hold

## 2019-08-08 ENCOUNTER — Telehealth: Payer: Self-pay | Admitting: Gastroenterology

## 2019-08-08 ENCOUNTER — Other Ambulatory Visit (HOSPITAL_COMMUNITY)
Admission: RE | Admit: 2019-08-08 | Discharge: 2019-08-08 | Disposition: A | Discharge status: Home / Self Care | Payer: Medicare PPO | Source: Ambulatory Visit | Attending: Gastroenterology | Admitting: Gastroenterology

## 2019-08-08 DIAGNOSIS — Z01812 Encounter for preprocedural laboratory examination: Secondary | ICD-10-CM | POA: Insufficient documentation

## 2019-08-08 DIAGNOSIS — Z20822 Contact with and (suspected) exposure to covid-19: Secondary | ICD-10-CM | POA: Insufficient documentation

## 2019-08-08 LAB — SARS CORONAVIRUS 2 (TAT 6-24 HRS): SARS Coronavirus 2: NEGATIVE

## 2019-08-08 NOTE — Telephone Encounter (Signed)
The pt had questions about taking her thyroid and COPD medications the morning of her manometry.  The pt was told she can take those 2 meds with a sip of water.  The pt has been advised of the information and verbalized understanding.

## 2019-08-13 ENCOUNTER — Encounter (HOSPITAL_COMMUNITY): Payer: Self-pay | Admitting: Gastroenterology

## 2019-08-13 ENCOUNTER — Ambulatory Visit (HOSPITAL_COMMUNITY)
Admission: RE | Admit: 2019-08-13 | Discharge: 2019-08-13 | Disposition: A | Discharge status: Home / Self Care | Payer: Medicare PPO | Attending: Gastroenterology | Admitting: Gastroenterology

## 2019-08-13 ENCOUNTER — Encounter (HOSPITAL_COMMUNITY)
Admission: RE | Disposition: A | Discharge status: Home / Self Care | Payer: Self-pay | Source: Home / Self Care | Attending: Gastroenterology

## 2019-08-13 DIAGNOSIS — K219 Gastro-esophageal reflux disease without esophagitis: Secondary | ICD-10-CM | POA: Diagnosis not present

## 2019-08-13 DIAGNOSIS — R142 Eructation: Secondary | ICD-10-CM

## 2019-08-13 HISTORY — PX: ESOPHAGEAL MANOMETRY: SHX5429

## 2019-08-13 HISTORY — PX: 24 HOUR PH STUDY: SHX5419

## 2019-08-13 HISTORY — PX: PH IMPEDANCE STUDY: SHX5565

## 2019-08-13 SURGERY — MANOMETRY, ESOPHAGUS

## 2019-08-13 MED ORDER — LIDOCAINE VISCOUS HCL 2 % MT SOLN
OROMUCOSAL | Status: AC
  Filled 2019-08-13: qty 15

## 2019-08-13 SURGICAL SUPPLY — 2 items
FACESHIELD LNG OPTICON STERILE (SAFETY) IMPLANT
GLOVE BIO SURGEON STRL SZ8 (GLOVE) ×8 IMPLANT

## 2019-08-13 NOTE — Progress Notes (Signed)
Esophageal manometry performed per protocol without complication. Patient tolerated well.  PH probe placed per protocol without complication.  Patient tolerated well.  Ph probe placed at 39 cm at left nare.  Education given to patient and spouse.  Both verbalized understanding.  Patient is to return at 0950 tomorrow to have probe removed.

## 2019-08-15 ENCOUNTER — Ambulatory Visit: Payer: Medicare PPO | Admitting: Physical Therapy

## 2019-08-19 ENCOUNTER — Ambulatory Visit: Payer: Medicare PPO | Attending: Internal Medicine | Admitting: Physical Therapy

## 2019-08-19 ENCOUNTER — Encounter: Payer: Self-pay | Admitting: Physical Therapy

## 2019-08-19 ENCOUNTER — Other Ambulatory Visit: Payer: Self-pay

## 2019-08-19 DIAGNOSIS — M62838 Other muscle spasm: Secondary | ICD-10-CM | POA: Insufficient documentation

## 2019-08-19 DIAGNOSIS — M542 Cervicalgia: Secondary | ICD-10-CM | POA: Diagnosis not present

## 2019-08-19 NOTE — Therapy (Signed)
[facility] [address] [place], [place], [address] Phone: [phone]   Fax:  [phone]  Physical Therapy Treatment  Patient Details  Name: Tina Lucas MRN: [id number] Date of Birth: [birth year] Referring Provider (PT): Binnie Rail, MD   Encounter Date: 08/19/2019   PT End of Session - 08/19/19 1101    Visit Number 3    Date for PT Re-Evaluation 10/09/19    PT Start Time 1101    PT Stop Time 1143    PT Time Calculation (min) 42 min    Activity Tolerance Patient tolerated treatment well    Behavior During Therapy [facility] for tasks assessed/performed           Past Medical History:  Diagnosis Date  . Allergic rhinitis   . Allergy   . Asthma    as a child  . 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 COPD (chronic obstructive pulmonary disease) ([place])   . History of asthma 1956-2000  . Hypothyroid 2013  . Osteopenia   . Primary HSV infection of mouth 2008  . Pulmonary nodule   . 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 [place] STUDY N/A 08/13/2019   Procedure: 24 HOUR PH STUDY- impedence;  Surgeon: Irving Copas., MD;  Location: [facility];  Service: Gastroenterology;  Laterality: N/A;  . COLONOSCOPY    . ESOPHAGEAL MANOMETRY N/A 08/13/2019   Procedure: ESOPHAGEAL MANOMETRY (EM);  Surgeon: Irving Copas., MD;  Location: [facility];  Service: Gastroenterology;  Laterality: N/A;  . [place] IMPEDANCE STUDY  08/13/2019   Procedure: [place] IMPEDANCE STUDY;  Surgeon: Rush Landmark Telford Nab., MD;  Location: [facility];  Service: Gastroenterology;;  . polyp on vocal chord     . TONSILLECTOMY  1958    There were no vitals filed for this visit.   Subjective Assessment - 08/19/19 1106    Subjective Pt had espophageal manometry since last visit and hasn't  been doing as much activity.  Pt states Friday last week she walked [place] and needed to ice after the walk and upper and lower back were hurting.  Saturday she took it easy and still hurting on Sunday so she didn't walk Saturday or Sunday.  Yesterday still hurting but still went to the gym anyway and that was okay - did not get worse    Currently in Pain? Yes    Pain Score 9     Pain Location Back    Pain Orientation Mid;Upper;Lower    Pain Descriptors / Indicators Aching;Sharp    Pain Type Chronic pain    Pain Radiating Towards upper T3-4; Lower back    Pain Onset More than a month ago    Pain Frequency Intermittent    Pain Relieving Factors asper cream and ice    Effect of Pain on Daily Activities woke                             [facility] Adult PT Treatment/Exercise - 08/19/19 0001      Neuro Re-ed    Neuro Re-ed Details  educated and performed diaphragmatic breathing - VC and TC to lengthen abdomen and reduce shoulder elevation ; cues to bulge instead of "suck in" abdomen      Lumbar Exercises: Stretches   Other Lumbar Stretch  Exercise hip/back extension very small ROM pelvis into wall - focus is on rectus abdominus lengthening - lots of education and verbal cues to get the stretch to the correct muscle group and not too much ext in the back for minimized nerve irritation - 5x 10 sec      Manual Therapy   Manual Therapy Myofascial release    Myofascial Release cervical and thoracic paraspinals in upright/recumbant on table due to vertigo                    PT Short Term Goals - 07/17/19 1723      PT SHORT TERM GOAL #1   Title pt will be able to make it through 2 weeks of normal activity with at most one episode of pain in the upper back    Time 4    Period Weeks    Status New    Target Date 08/14/19      PT SHORT TERM GOAL #2   Title .Marland Kitchen      PT SHORT TERM GOAL #3   Title .Marland Kitchen             PT Long Term Goals - 07/17/19 1720       PT LONG TERM GOAL #1   Title pt will be ind with advanced HEP    Time 12    Period Weeks    Status New    Target Date 10/09/19      PT LONG TERM GOAL #2   Title Pt will report 50% less pain with working out at the gym    Time 12    Period Weeks    Status New    Target Date 10/09/19      PT LONG TERM GOAL #3   Title FOTO < or = to 38% limited    Baseline 49%    Time 12    Period Weeks    Status New    Target Date 10/09/19      PT LONG TERM GOAL #4   Title pt demonstrates improved cervical rotation to 60 deg bilaterally for improved ability to navigate traffic while driving    Baseline 55 deg right; 65 left    Time 12    Period Weeks    Status New    Target Date 10/09/19      PT LONG TERM GOAL #5   Title Pt will report 50% less muscle spasms in her neck so she can tolerate going for walks at least 3x/week for 45 minutes or more without pain    Baseline sometimes only 30 minutes with pain    Time 12    Period Weeks    Status New    Target Date 10/09/19                 Plan - 08/19/19 1151    Clinical Impression Statement Pt tolerated treatment well.  Focus on lengthening abdominal wall and gentle traction along fascial in upper back as able due to patient positional limitations as she has vertigo and cannot lie flat. Pt felt better after treatment today.  She demonstrates increased accessory muscle use of SCM, UT, scalenes with inspiration.  She was given VC and TC to breathe into her belly and use diaphragm more .  Pt will benefit from skilled PT to continue working on msucle length and strength for improved posture so she can return to maximum functional and recreational activities.    Comorbidities  osteoporosis, COPD, esophogeal spasms (can cause chest pains    PT Treatment/Interventions ADLs/Self Care Home Management;Biofeedback;Cryotherapy;Electrical Stimulation;Moist Heat;Therapeutic activities;Therapeutic exercise;Neuromuscular re-education;Patient/family  education;Manual techniques;Passive range of motion;Taping;Dry needling    PT Next Visit Plan MFR to cervical paraspinals, thoracic paraspinals, stretch abdominal wall and rectus abdominal; diaphragmatic breathing in comfortable positions, review gym ex add chop and trunk rotation with band; pushing exercises    PT Home Exercise Plan Access Code: [email or web address]    Consulted and Agree with Plan of Care Patient           Patient will benefit from skilled therapeutic intervention in order to improve the following deficits and impairments:  Increased fascial restricitons, Decreased range of motion, Decreased strength, Postural dysfunction, Pain, Difficulty walking  Visit Diagnosis: Other muscle spasm  Cervicalgia     Problem List Patient Active Problem List   Diagnosis Date Noted  . Upper back pain on left side 07/15/2019  . Hiatal hernia 06/15/2019  . Functional heartburn 06/15/2019  . Hypersensitivity 06/15/2019  . Chest wall discomfort 07/22/2018  . Pyrosis 04/26/2018  . Positive colorectal cancer screening using Cologuard test 04/26/2018  . Gastric ulcer without hemorrhage or perforation 04/26/2018  . Abnormal findings on esophagogastroduodenoscopy (EGD) 04/26/2018  . Atypical chest pain 02/26/2018  . Asthma-COPD overlap syndrome ([place]) 01/31/2018  . Chronic neck pain 08/27/2017  . GERD (gastroesophageal reflux disease) 04/20/2016  . Numbness 08/06/2015  . Osteopenia 12/10/2014  . Allergic urticaria 12/10/2014  . Pressure in head 04/01/2014  . Vertigo 02/10/2014  . Elevated blood pressure reading 11/06/2013  . Hypothyroid   . Primary HSV infection of mouth     Jule Ser, PT 08/19/2019, 12:14 PM   [facility] [address] [place], [place], [address] Phone: [phone]   Fax:  [phone]  Name: Caleesi Kohl MRN: [id number] Date of Birth: 1951/01/07

## 2019-08-19 NOTE — Patient Instructions (Signed)
Access Code: [email or web address] URL: [email or web address] Date: 08/19/2019 Prepared by: Jari Favre  Exercises Seated Thoracic Flexion and Rotation with Arms Crossed - 1 x daily - 7 x weekly - 10 reps - 1 sets - 5 sec hold Seated Lumbar Flexion Stretch - 1 x daily - 7 x weekly - 3 sets - 10 reps Child's Pose with Sidebending - 1 x daily - 7 x weekly - 3 reps - 1 sets - 30 sec hold Standing Lumbar Extension at [place] - 2 x daily - 7 x weekly - 1 sets - 5 reps - 20 hold

## 2019-08-26 ENCOUNTER — Other Ambulatory Visit: Payer: Self-pay

## 2019-08-26 ENCOUNTER — Encounter: Payer: Self-pay | Admitting: Physical Therapy

## 2019-08-26 ENCOUNTER — Ambulatory Visit: Payer: Medicare PPO | Attending: Internal Medicine | Admitting: Physical Therapy

## 2019-08-26 DIAGNOSIS — R142 Eructation: Secondary | ICD-10-CM

## 2019-08-26 DIAGNOSIS — M542 Cervicalgia: Secondary | ICD-10-CM

## 2019-08-26 DIAGNOSIS — M62838 Other muscle spasm: Secondary | ICD-10-CM | POA: Diagnosis not present

## 2019-08-26 NOTE — Therapy (Signed)
[facility] [address] [place], [place], [address] Phone: [phone]   Fax:  [phone]  Physical Therapy Treatment  Patient Details  Name: Tina Lucas MRN: [id number] Date of Birth: 03/16/1950 Referring Provider (PT): Binnie Rail, MD   Encounter Date: 08/26/2019   PT End of Session - 08/26/19 1104    Visit Number 4    Date for PT Re-Evaluation 10/09/19    PT Start Time 1101    PT Stop Time 1152    PT Time Calculation (min) 51 min    Activity Tolerance Patient tolerated treatment well    Behavior During Therapy [facility] for tasks assessed/performed           Past Medical History:  Diagnosis Date  . Allergic rhinitis   . Allergy   . Asthma    as a child  . 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 COPD (chronic obstructive pulmonary disease) ([place])   . History of asthma 1956-2000  . Hypothyroid 2013  . Osteopenia   . Primary HSV infection of mouth 2008  . Pulmonary nodule   . 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 [place] STUDY N/A 08/13/2019   Procedure: 24 HOUR PH STUDY- impedence;  Surgeon: Irving Copas., MD;  [facility];  Service: Gastroenterology;  Laterality: N/A;  . COLONOSCOPY    . ESOPHAGEAL MANOMETRY N/A 08/13/2019   Procedure: ESOPHAGEAL MANOMETRY (EM);  Surgeon: Irving Copas., MD;  [facility];  Service: Gastroenterology;  Laterality: N/A;  . [place] IMPEDANCE STUDY  08/13/2019   Procedure: [place] IMPEDANCE STUDY;  Surgeon: Rush Landmark Telford Nab., MD;  [facility];  Service: Gastroenterology;;  . polyp on vocal chord     . TONSILLECTOMY  1958    There were no vitals filed for this visit.   Subjective Assessment - 08/26/19 1103    Subjective I was feeling weak so I did the inhaler and I am doing  better. Yesterday was a very good day.  I walked Saturday morning 55 minutes and didn't need ice pack until night (which is normal).  I haven't used asper cream as much.  I worked on the stretch against the wall.  I felt like I had a good workout yesterday and was able to do rows                             [facility] Adult PT Treatment/Exercise - 08/26/19 0001      Self-Care   Self-Care Other Self-Care Comments    Other Self-Care Comments  review of HEP and gym exercises      Lumbar Exercises: Stretches   Other Lumbar Stretch Exercise lumbar and thoracic flexion seated; side bend stretch in doorway      Lumbar Exercises: Standing   Other Standing Lumbar Exercises chest press red band 5 x; trunk rotation Rt 10x and to Lt 5x (stopped due to pain)      Manual Therapy   Myofascial Release cervical and thoracic paraspinals in upright/recumbant on table due to vertigo                  PT Education - 08/26/19 1156    Education Details Access Code: [email or web address]    Person(s) Educated Patient    Methods Explanation;Demonstration;Verbal cues;Handout;Tactile cues  Comprehension Verbalized understanding;Returned demonstration            PT Short Term Goals - 07/17/19 1723      PT SHORT TERM GOAL #1   Title pt will be able to make it through 2 weeks of normal activity with at most one episode of pain in the upper back    Time 4    Period Weeks    Status New    Target Date 08/14/19      PT SHORT TERM GOAL #2   Title .Marland Kitchen      PT SHORT TERM GOAL #3   Title .Marland Kitchen             PT Long Term Goals - 07/17/19 1720      PT LONG TERM GOAL #1   Title pt will be ind with advanced HEP    Time 12    Period Weeks    Status New    Target Date 10/09/19      PT LONG TERM GOAL #2   Title Pt will report 50% less pain with working out at the gym    Time 12    Period Weeks    Status New    Target Date 10/09/19      PT LONG TERM GOAL #3   Title FOTO < or = to 38% limited     Baseline 49%    Time 12    Period Weeks    Status New    Target Date 10/09/19      PT LONG TERM GOAL #4   Title pt demonstrates improved cervical rotation to 60 deg bilaterally for improved ability to navigate traffic while driving    Baseline 55 deg right; 65 left    Time 12    Period Weeks    Status New    Target Date 10/09/19      PT LONG TERM GOAL #5   Title Pt will report 50% less muscle spasms in her neck so she can tolerate going for walks at least 3x/week for 45 minutes or more without pain    Baseline sometimes only 30 minutes with pain    Time 12    Period Weeks    Status New    Target Date 10/09/19                 Plan - 08/26/19 1158    Clinical Impression Statement Todays session focused on review of gym exercises.  Overall, patient had a good week and able to get some good workouts in and walk without immediately having back pain.  Pt was aggravated with trunk rotation to the Lt which brought on the painful spot on the left side.  MFR did not completely get rid of the pain today.  Pt did well with side bend stretch.  She continues to demonstrate tension in abdomen but stretches are helping.  She will benefit from skilled PT to cont addressing impairments for return to activities.    Comorbidities osteoporosis, COPD, esophogeal spasms (can cause chest pains    PT Treatment/Interventions ADLs/Self Care Home Management;Biofeedback;Cryotherapy;Electrical Stimulation;Moist Heat;Therapeutic activities;Therapeutic exercise;Neuromuscular re-education;Patient/family education;Manual techniques;Passive range of motion;Taping;Dry needling    PT Next Visit Plan pallof press; MFR to cervical into thoracic; MFR to diaphragm and intercostals    PT Home Exercise Plan Access Code: [email or web address]    Consulted and Agree with Plan of Care Patient           Patient will benefit from  skilled therapeutic intervention in order to improve the following deficits and impairments:   Increased fascial restricitons, Decreased range of motion, Decreased strength, Postural dysfunction, Pain, Difficulty walking  Visit Diagnosis: Other muscle spasm  Cervicalgia     Problem List Patient Active Problem List   Diagnosis Date Noted  . Upper back pain on left side 07/15/2019  . Hiatal hernia 06/15/2019  . Functional heartburn 06/15/2019  . Hypersensitivity 06/15/2019  . Chest wall discomfort 07/22/2018  . Pyrosis 04/26/2018  . Positive colorectal cancer screening using Cologuard test 04/26/2018  . Gastric ulcer without hemorrhage or perforation 04/26/2018  . Abnormal findings on esophagogastroduodenoscopy (EGD) 04/26/2018  . Atypical chest pain 02/26/2018  . Asthma-COPD overlap syndrome ([place]) 01/31/2018  . Chronic neck pain 08/27/2017  . GERD (gastroesophageal reflux disease) 04/20/2016  . Numbness 08/06/2015  . Osteopenia 12/10/2014  . Allergic urticaria 12/10/2014  . Pressure in head 04/01/2014  . Vertigo 02/10/2014  . Elevated blood pressure reading 11/06/2013  . Hypothyroid   . Primary HSV infection of mouth     Jule Ser, PT 08/26/2019, 12:12 PM  [facility] [address] [place], [place], [address] Phone: [phone]   Fax:  [phone]  Name: Haylea Schlichting MRN: [id number] Date of Birth: 1950-10-05

## 2019-08-26 NOTE — Patient Instructions (Signed)
Access Code: [email or web address] URL: [email or web address] Date: 08/26/2019 Prepared by: Jari Favre  Exercises Standing Lumbar Extension at [place] - 2 x daily - 7 x weekly - 1 sets - 5 reps - 20 hold TL Sidebending Stretch - Arms Overhead - 1 x daily - 7 x weekly - 3 sets - 10 reps

## 2019-09-01 ENCOUNTER — Ambulatory Visit: Payer: Medicare PPO | Admitting: Physical Therapy

## 2019-09-01 ENCOUNTER — Other Ambulatory Visit: Payer: Self-pay

## 2019-09-01 DIAGNOSIS — M62838 Other muscle spasm: Secondary | ICD-10-CM | POA: Diagnosis not present

## 2019-09-01 DIAGNOSIS — M542 Cervicalgia: Secondary | ICD-10-CM | POA: Diagnosis not present

## 2019-09-01 NOTE — Therapy (Signed)
[facility] [address] [place], [place], [address] Phone: [phone]   Fax:  [phone]  Physical Therapy Treatment  Patient Details  Name: Tina Lucas MRN: [id number] Date of Birth: [birth year] Referring Provider (PT): Binnie Rail, MD   Encounter Date: 09/01/2019   PT End of Session - 09/01/19 1233    Visit Number 5    Date for PT Re-Evaluation 10/09/19    PT Start Time 1147    PT Stop Time 1230    PT Time Calculation (min) 43 min    Activity Tolerance Patient tolerated treatment well    Behavior During Therapy [facility] for tasks assessed/performed           Past Medical History:  Diagnosis Date  . Allergic rhinitis   . Allergy   . Asthma    as a child  . 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 COPD (chronic obstructive pulmonary disease) ([place])   . History of asthma 1956-2000  . Hypothyroid 2013  . Osteopenia   . Primary HSV infection of mouth 2008  . Pulmonary nodule   . 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 [place] STUDY N/A 08/13/2019   Procedure: 24 HOUR PH STUDY- impedence;  Surgeon: Irving Copas., MD;  [facility];  Service: Gastroenterology;  Laterality: N/A;  . COLONOSCOPY    . ESOPHAGEAL MANOMETRY N/A 08/13/2019   Procedure: ESOPHAGEAL MANOMETRY (EM);  Surgeon: Irving Copas., MD;  [facility];  Service: Gastroenterology;  Laterality: N/A;  . [place] IMPEDANCE STUDY  08/13/2019   Procedure: [place] IMPEDANCE STUDY;  Surgeon: Rush Landmark Telford Nab., MD;  [facility];  Service: Gastroenterology;;  . polyp on vocal chord     . TONSILLECTOMY  1958    There were no vitals filed for this visit.   Subjective Assessment - 09/01/19 1559    Subjective around the Lt shoulder blade is achy.  No pain number given.  It has been aggravating and I am still doing my routine for my mental health.    Patient Stated Goals reduce shoulder pain so she can walk, work out and maybe play pickle ball    Currently in Pain? Yes    Pain Score --   not given   Pain Location Scapula    Pain Orientation Left    Pain Descriptors / Indicators Aching    Pain Type Chronic pain    Pain Radiating Towards around    Multiple Pain Sites No                             [facility] Adult PT Treatment/Exercise - 09/01/19 0001      Lumbar Exercises: Stretches   Other Lumbar Stretch Exercise cross body shoulder stretch; lumbar extension against the wall - 20sec x 3      Manual Therapy   Manual Therapy Joint mobilization;Soft tissue mobilization    Manual therapy comments seated position    Joint Mobilization AP mobs to 3rd rib    Soft tissue mobilization pec maj/minor, subclavius    Myofascial Release along thoracic                  PT Education - 09/01/19 1233    Education Details Access Code: [email or web address]    Person(s) Educated Patient  Methods Explanation;Demonstration;Tactile cues;Verbal cues;Handout    Comprehension Verbalized understanding;Returned demonstration            PT Short Term Goals - 09/01/19 1234      PT SHORT TERM GOAL #1   Title pt will be able to make it through 2 weeks of normal activity with at most one episode of pain in the upper back    Baseline had pain that lasted most days    Status On-going             PT Long Term Goals - 07/17/19 1720      PT LONG TERM GOAL #1   Title pt will be ind with advanced HEP    Time 12    Period Weeks    Status New    Target Date 10/09/19      PT LONG TERM GOAL #2   Title Pt will report 50% less pain with working out at the gym    Time 12    Period Weeks    Status New    Target Date 10/09/19      PT LONG TERM GOAL #3   Title FOTO < or = to 38% limited    Baseline 49%    Time 12    Period Weeks    Status New    Target  Date 10/09/19      PT LONG TERM GOAL #4   Title pt demonstrates improved cervical rotation to 60 deg bilaterally for improved ability to navigate traffic while driving    Baseline 55 deg right; 65 left    Time 12    Period Weeks    Status New    Target Date 10/09/19      PT LONG TERM GOAL #5   Title Pt will report 50% less muscle spasms in her neck so she can tolerate going for walks at least 3x/week for 45 minutes or more without pain    Baseline sometimes only 30 minutes with pain    Time 12    Period Weeks    Status New    Target Date 10/09/19                 Plan - 09/01/19 1235    Clinical Impression Statement Pt felt better after session today and responded well to AP mobs to 3rd rib as well as STM to the pectoralis muscles.  Pt has had success with the lumbar extension to stretch abdominals.  She was given cross chest shoulder stretch to improve soft tissue mobility around the Lt shoulder. Recommended to continue skilled PT to progress spinal mobility and stability    PT Treatment/Interventions ADLs/Self Care Home Management;Biofeedback;Cryotherapy;Electrical Stimulation;Moist Heat;Therapeutic activities;Therapeutic exercise;Neuromuscular re-education;Patient/family education;Manual techniques;Passive range of motion;Taping;Dry needling    PT Next Visit Plan f/u on new stretch; pec and rib mobs    PT Home Exercise Plan Access Code: [email or web address]    Consulted and Agree with Plan of Care Patient           Patient will benefit from skilled therapeutic intervention in order to improve the following deficits and impairments:  Increased fascial restricitons, Decreased range of motion, Decreased strength, Postural dysfunction, Pain, Difficulty walking  Visit Diagnosis: Other muscle spasm  Cervicalgia     Problem List Patient Active Problem List   Diagnosis Date Noted  . Belching   . Upper back pain on left side 07/15/2019  . Hiatal hernia 06/15/2019  . Functional  heartburn 06/15/2019  .  Hypersensitivity 06/15/2019  . Chest wall discomfort 07/22/2018  . Pyrosis 04/26/2018  . Positive colorectal cancer screening using Cologuard test 04/26/2018  . Gastric ulcer without hemorrhage or perforation 04/26/2018  . Abnormal findings on esophagogastroduodenoscopy (EGD) 04/26/2018  . Atypical chest pain 02/26/2018  . Asthma-COPD overlap syndrome ([place]) 01/31/2018  . Chronic neck pain 08/27/2017  . GERD (gastroesophageal reflux disease) 04/20/2016  . Numbness 08/06/2015  . Osteopenia 12/10/2014  . Allergic urticaria 12/10/2014  . Pressure in head 04/01/2014  . Vertigo 02/10/2014  . Elevated blood pressure reading 11/06/2013  . Hypothyroid   . Primary HSV infection of mouth     Jule Ser, PT 09/01/2019, 4:04 PM  [facility] [address], [place], [place], [address] Phone: [phone]   Fax:  [phone]  Name: Jenasia Dolinar MRN: [id number] Date of Birth: 11-20-1950

## 2019-09-01 NOTE — Patient Instructions (Signed)
Access Code: [email or web address] URL: [email or web address] Date: 09/01/2019 Prepared by: Jari Favre  Exercises Standing Lumbar Extension at [place] - 2 x daily - 7 x weekly - 1 sets - 5 reps - 20 hold TL Sidebending Stretch - Arms Overhead - 1 x daily - 7 x weekly - 3 sets - 10 reps Seated Shoulder Cross Body Stretch - 1 x daily - 7 x weekly - 3 sets - 10 reps

## 2019-09-05 ENCOUNTER — Encounter: Payer: Self-pay | Admitting: Gastroenterology

## 2019-09-05 ENCOUNTER — Telehealth: Payer: Self-pay | Admitting: Gastroenterology

## 2019-09-05 NOTE — Telephone Encounter (Signed)
Dr Rush Landmark have you seen the manometry results?  The pt is calling for recommendations.

## 2019-09-05 NOTE — Telephone Encounter (Signed)
Open in error

## 2019-09-07 NOTE — Telephone Encounter (Signed)
Patty, I have had an opportunity to review them. They are very suggestive of a functional or hypersensitive esophagus. Her true acid reflux is well controlled. I had previously discussed with her medications that we may consider in regards to a hypersensitive esophagus treatment. We can discuss that further. However, I am in the hospital for the the next few days and will be out on Friday so I am not sure if I will be able to reach out to them before next week. But please let them know this and ask if they are interested in some of the medications we spoke about previously or if she wants to see [name] back in clinic before trialing any of those medications that do have some side effects. Please keep me up-to-date. Thanks. GM

## 2019-09-08 ENCOUNTER — Other Ambulatory Visit: Payer: Self-pay

## 2019-09-08 ENCOUNTER — Encounter: Payer: Self-pay | Admitting: Physical Therapy

## 2019-09-08 ENCOUNTER — Ambulatory Visit: Payer: Medicare PPO | Admitting: Physical Therapy

## 2019-09-08 DIAGNOSIS — M542 Cervicalgia: Secondary | ICD-10-CM | POA: Diagnosis not present

## 2019-09-08 DIAGNOSIS — M62838 Other muscle spasm: Secondary | ICD-10-CM

## 2019-09-08 NOTE — Patient Instructions (Signed)
access Code: [email or web address] URL: [email or web address] Date: 09/08/2019 Prepared by: Jari Favre  Exercises Standing Lumbar Extension at [place] - 2 x daily - 7 x weekly - 1 sets - 5 reps - 20 hold TL Sidebending Stretch - Arms Overhead - 1 x daily - 7 x weekly - 3 sets - 10 reps Seated Shoulder Cross Body Stretch - 1 x daily - 7 x weekly - 3 sets - 10 reps Standing Shoulder Abduction Slides at Wall - 1 x daily - 7 x weekly - 1 sets - 10 reps

## 2019-09-08 NOTE — Telephone Encounter (Signed)
The pt has asked to be seen in the office to discuss.  Appt has been made for 8/19 at 150 pm with Dr Rush Landmark.  The pt is aware

## 2019-09-08 NOTE — Therapy (Signed)
[facility] [address] [place], [place], [address] Phone: [phone]   Fax:  [phone]  Physical Therapy Treatment  Patient Details  Name: Tina Lucas MRN: [id number] Date of Birth: 02/03/1950 Referring Provider (PT): Binnie Rail, MD   Encounter Date: 09/08/2019   PT End of Session - 09/08/19 1238    Visit Number 6    Date for PT Re-Evaluation 10/09/19    PT Start Time 1150    PT Stop Time 1231    PT Time Calculation (min) 41 min    Activity Tolerance Patient tolerated treatment well    Behavior During Therapy [facility] for tasks assessed/performed           Past Medical History:  Diagnosis Date   Allergic rhinitis    Allergy    Asthma    as a child   Cataract    surgical correction, 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   COPD (chronic obstructive pulmonary disease) ([place])    History of asthma 1956-2000   Hypothyroid 2013   Osteopenia    Primary HSV infection of mouth 2008   Pulmonary nodule    Sunlight-induced angio-edema-urticaria 07/11/2011   unknown trigger, neg autoimmune workup    Past Surgical History:  Procedure Laterality Date   32 HOUR [place] STUDY N/A 08/13/2019   Procedure: 24 HOUR PH STUDY- impedence;  Surgeon: Irving Copas., MD;  [facility];  Service: Gastroenterology;  Laterality: N/A;   COLONOSCOPY     ESOPHAGEAL MANOMETRY N/A 08/13/2019   Procedure: ESOPHAGEAL MANOMETRY (EM);  Surgeon: Irving Copas., MD;  [facility];  Service: Gastroenterology;  Laterality: N/A;   [place] IMPEDANCE STUDY  08/13/2019   Procedure: [place] IMPEDANCE STUDY;  Surgeon: Rush Landmark Telford Nab., MD;  [facility];  Service: Gastroenterology;;   polyp on vocal chord      TONSILLECTOMY  1958    There were no vitals filed for this visit.   Subjective Assessment - 09/08/19 1157    Subjective I am hurting the days after gym days; after walking; after  pickel ball.  Pain getting up to 10/10    Pertinent History osteoporosis, COPD, esophogeal spasms (can cause chest pains)    Patient Stated Goals reduce shoulder pain so she can walk, work out and maybe play pickle ball    Currently in Pain? Yes    Pain Score 2    7/10 up to 10/10 after pickel ball yesterday   Pain Location Scapula    Pain Orientation Left                             [facility] Adult PT Treatment/Exercise - 09/08/19 0001      Lumbar Exercises: Standing   Other Standing Lumbar Exercises pec stretch and lumbar ext against the wall; shoulder abduction stretch wall slide - 3 x 20 sec      Manual Therapy   Manual therapy comments seated position    Joint Mobilization AP mobs to 3rd rib    Soft tissue mobilization pec maj/minor, subclavius    Myofascial Release along thoracic                  PT Education - 09/08/19 1242    Education Details Access Code: [email or web address]    Person(s) Educated Patient    Methods Explanation;Demonstration;Tactile cues;Verbal cues;Handout    Comprehension Returned demonstration;Verbalized understanding  PT Short Term Goals - 09/08/19 1238      PT SHORT TERM GOAL #1   Title pt will be able to make it through 2 weeks of normal activity with at most one episode of pain in the upper back    Baseline only one day of severe pain much better this week; only one walk but normal gym time    Status Partially Met             PT Long Term Goals - 07/17/19 1720      PT LONG TERM GOAL #1   Title pt will be ind with advanced HEP    Time 12    Period Weeks    Status New    Target Date 10/09/19      PT LONG TERM GOAL #2   Title Pt will report 50% less pain with working out at the gym    Time 12    Period Weeks    Status New    Target Date 10/09/19      PT LONG TERM GOAL #3   Title FOTO < or = to 38% limited    Baseline 49%    Time 12    Period Weeks    Status New    Target Date 10/09/19      PT LONG  TERM GOAL #4   Title pt demonstrates improved cervical rotation to 60 deg bilaterally for improved ability to navigate traffic while driving    Baseline 55 deg right; 65 left    Time 12    Period Weeks    Status New    Target Date 10/09/19      PT LONG TERM GOAL #5   Title Pt will report 50% less muscle spasms in her neck so she can tolerate going for walks at least 3x/week for 45 minutes or more without pain    Baseline sometimes only 30 minutes with pain    Time 12    Period Weeks    Status New    Target Date 10/09/19                 Plan - 09/08/19 1239    Clinical Impression Statement Progress is slow but pt was able to do more gym workouts this week . Pt did have one episode of pain that was up to 10/10.  Pt overall much better this week . Continued to focus on MFR and improve rib and spine mobility. Pt felt good doing the abductions stretch against the wall and added as shown in HEP.  Continue with MFR and strength as tolerated.    Personal Factors and Comorbidities Comorbidity 3+;Time since onset of injury/illness/exacerbation    Comorbidities osteoporosis, COPD, esophogeal spasms (can cause chest pains    PT Treatment/Interventions ADLs/Self Care Home Management;Biofeedback;Cryotherapy;Electrical Stimulation;Moist Heat;Therapeutic activities;Therapeutic exercise;Neuromuscular re-education;Patient/family education;Manual techniques;Passive range of motion;Taping;Dry needling    PT Next Visit Plan f/u on new stretch; pec and rib mobs    PT Home Exercise Plan Access Code: [email or web address]    Consulted and Agree with Plan of Care Patient           Patient will benefit from skilled therapeutic intervention in order to improve the following deficits and impairments:  Increased fascial restricitons, Decreased range of motion, Decreased strength, Postural dysfunction, Pain, Difficulty walking  Visit Diagnosis: Other muscle spasm  Cervicalgia     Problem List Patient Active  Problem List   Diagnosis Date Noted   Belching  Upper back pain on left side 07/15/2019   Hiatal hernia 06/15/2019   Functional heartburn 06/15/2019   Hypersensitivity 06/15/2019   Chest wall discomfort 07/22/2018   Pyrosis 04/26/2018   Positive colorectal cancer screening using Cologuard test 04/26/2018   Gastric ulcer without hemorrhage or perforation 04/26/2018   Abnormal findings on esophagogastroduodenoscopy (EGD) 04/26/2018   Atypical chest pain 02/26/2018   Asthma-COPD overlap syndrome ([place]) 01/31/2018   Chronic neck pain 08/27/2017   GERD (gastroesophageal reflux disease) 04/20/2016   Numbness 08/06/2015   Osteopenia 12/10/2014   Allergic urticaria 12/10/2014   Pressure in head 04/01/2014   Vertigo 02/10/2014   Elevated blood pressure reading 11/06/2013   Hypothyroid    Primary HSV infection of mouth     Jule Ser, PT 09/08/2019, 12:44 PM  [facility] [address] [place], [place], [address] Phone: [phone]   Fax:  [phone]  Name: Tina Lucas MRN: [id number] Date of Birth: Jun 08, 1950

## 2019-09-11 ENCOUNTER — Other Ambulatory Visit: Payer: Self-pay | Admitting: Internal Medicine

## 2019-09-11 ENCOUNTER — Encounter: Payer: Self-pay | Admitting: Gastroenterology

## 2019-09-11 ENCOUNTER — Ambulatory Visit: Payer: Medicare PPO | Admitting: Gastroenterology

## 2019-09-11 VITALS — BP 130/82 | HR 81 | Ht 66.0 in | Wt 120.2 lb

## 2019-09-11 DIAGNOSIS — K219 Gastro-esophageal reflux disease without esophagitis: Secondary | ICD-10-CM

## 2019-09-11 DIAGNOSIS — R12 Heartburn: Secondary | ICD-10-CM

## 2019-09-11 DIAGNOSIS — T7840XD Allergy, unspecified, subsequent encounter: Secondary | ICD-10-CM

## 2019-09-11 NOTE — Patient Instructions (Signed)
Starting on Saturday Alternate your Omeprazole 40mg  and 80mg , to every other day for 2 weeks, then stop.   Please discuss with Dr. Quay Burow about these two medicines : Desipramine or Amitriptyline. Please let us know which medicine you would like to try after speaking with Dr. Quay Burow. You may call office or send mychart message.    If you are age 70 or older, your body mass index should be between 23-30. Your Body mass index is 19.41 kg/m. If this is out of the aforementioned range listed, please consider follow up with your Primary Care Provider.  If you are age 46 or younger, your body mass index should be between 19-25. Your Body mass index is 19.41 kg/m. If this is out of the aformentioned range listed, please consider follow up with your Primary Care Provider.    Thank you for choosing me and [facility].  Dr. Rush Landmark

## 2019-09-15 ENCOUNTER — Other Ambulatory Visit: Payer: Self-pay

## 2019-09-15 ENCOUNTER — Encounter: Payer: Self-pay | Admitting: Gastroenterology

## 2019-09-15 ENCOUNTER — Encounter: Payer: Self-pay | Admitting: Physical Therapy

## 2019-09-15 ENCOUNTER — Ambulatory Visit: Payer: Medicare PPO | Admitting: Physical Therapy

## 2019-09-15 DIAGNOSIS — M542 Cervicalgia: Secondary | ICD-10-CM

## 2019-09-15 DIAGNOSIS — M62838 Other muscle spasm: Secondary | ICD-10-CM

## 2019-09-15 NOTE — Progress Notes (Signed)
[place] VISIT   Primary Care Provider Binnie Rail, MD [place] [address] [phone]  Patient Profile: Tina Lucas is a 69 y.o. female with a pmh significant for COPD, hypothyroidism, allergic rhinitis, arthralgias, prior PUD, microscopic acid reflux changes, with evidence of ?Hypersensitive Esophagus +/- Functional Heartburn.  The patient presents to the [facility] for an evaluation and management of problem(s) noted below:  Problem List 1. Esophageal hypersensitivity   2. Gastroesophageal reflux disease without esophagitis   3. Functional heartburn     History of Present Illness: Please see initial consultation note and progress notes for full details of HPI.    Interval History The patient returns for scheduled follow-up.  The patient recently underwent esophageal manometry and pH impedance testing.  His results are as below.  The patient continues to have chest discomfort that does not allow her to play pickleball.  She continues to have issues with burping and belching.  We did her studies on PPI.  Her pyrosis is improved but she has concerns about continued PPI use.  She continues to use inhalers for her diagnosed lung disease.  She denies any dysphagia or odynophagia.  GI Review of Systems Positive as above Negative for nausea, vomiting, bloating, change in bowel habits  Review of Systems General: Denies fevers/chills/weight loss unintentionally Cardiovascular: Denies chest pain currently Pulmonary: Denies shortness of breath Gastroenterological: See HPI Genitourinary: Denies darkened urine Hematological: Denies easy bruising Dermatological: Denies jaundice Psychological: Mood remains anxious to get an answer to her symptoms because she wants to feel better   Medications Current Outpatient Medications  Medication Sig Dispense Refill  . Calcium Carbonate-Vitamin D (CALTRATE 600+D) 600-400  MG-UNIT per tablet Take 1 tablet by mouth daily.    . cholecalciferol (VITAMIN D) 400 UNITS TABS tablet Take 400 Units by mouth.    . fexofenadine (ALLEGRA) 180 MG tablet Take 180 mg by mouth daily.    . fluocinonide cream (LIDEX) 5.39 % Apply 1 application topically 2 (two) times daily as needed (FOR PSORIASIS).     . hydrocortisone 2.5 % cream     . levalbuterol (XOPENEX HFA) 45 MCG/ACT inhaler Inhale 1-2 puffs into the lungs every 4 (four) hours as needed for wheezing. (Patient taking differently: Inhale 1-2 puffs into the lungs every 4 (four) hours as needed for wheezing. Pt takes 1-2 times a week) 1 Inhaler 6  . levothyroxine (SYNTHROID) 50 MCG tablet Take 1 tablet (50 mcg total) by mouth daily with breakfast. 90 tablet 3  . Magnesium 250 MG TABS Take 125 mg by mouth daily.    . mometasone-formoterol (DULERA) 200-5 MCG/ACT AERO Inhale 2 puffs into the lungs 2 (two) times daily. Appt needed for future refills. 13 g 11  . Multiple Vitamins-Minerals (ONE-A-DAY WOMENS 50+ ADVANTAGE PO) Take by mouth daily.    Marland Kitchen omeprazole (PRILOSEC) 40 MG capsule Take 1 capsule (40 mg total) by mouth 2 (two) times daily before a meal. 180 capsule 1  . prednisoLONE acetate (PRED FORTE) 1 % ophthalmic suspension Place 1 drop into the right eye daily. Pt takes 2 times per week    . triamcinolone (KENALOG) 0.1 % paste     . celecoxib (CELEBREX) 100 MG capsule TAKE ONE CAPSULE BY MOUTH TWICE A DAY 180 capsule 3   No current facility-administered medications for this visit.    Allergies Allergies  Allergen Reactions  . Singulair [Montelukast Sodium] Other (See Comments)    headaches  Histories Past Medical History:  Diagnosis Date  . Allergic rhinitis   . Allergy   . Asthma    as a child  . 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 COPD (chronic obstructive pulmonary disease) ([place])   . History of asthma 1956-2000  . Hypothyroid 2013  . Osteopenia   . Primary HSV  infection of mouth 2008  . Pulmonary nodule   . 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 [place] STUDY N/A 08/13/2019   Procedure: 24 HOUR PH STUDY- impedence;  Surgeon: Irving Copas., MD;  Location: [facility];  Service: Gastroenterology;  Laterality: N/A;  . COLONOSCOPY    . ESOPHAGEAL MANOMETRY N/A 08/13/2019   Procedure: ESOPHAGEAL MANOMETRY (EM);  Surgeon: Irving Copas., MD;  Location: [facility];  Service: Gastroenterology;  Laterality: N/A;  . [place] IMPEDANCE STUDY  08/13/2019   Procedure: [place] IMPEDANCE STUDY;  Surgeon: Rush Landmark Telford Nab., MD;  Location: [facility];  Service: Gastroenterology;;  . polyp on vocal chord     . TONSILLECTOMY  1958   Social History   Socioeconomic History  . Marital status: Married    Spouse name: Not on file  . Number of children: Not on file  . Years of education: Not on file  . Highest education level: Not on file  Occupational History  . Not on file  Tobacco Use  . Smoking status: Never Smoker  . Smokeless tobacco: Never Used  Vaping Use  . Vaping Use: Never used  Substance and Sexual Activity  . Alcohol use: Not Currently    Alcohol/week: 0.0 standard drinks  . Drug use: No  . Sexual activity: Not on file  Other Topics Concern  . Not on file  Social History Narrative   Lives with husband in a one story home.  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   Social Determinants of Health   Financial Resource Strain:   . Difficulty of Paying Living Expenses: Not on file  Food Insecurity:   . Worried About Charity fundraiser in the Last Year: Not on file  . Ran Out of Food in the Last Year: Not on file  Transportation Needs:   . Lack of Transportation (Medical): Not on file  . Lack of Transportation (Non-Medical): Not on file  Physical Activity:   . Days of Exercise per Week: Not on file  .  Minutes of Exercise per Session: Not on file  Stress:   . Feeling of Stress : Not on file  Social Connections:   . Frequency of Communication with Friends and Family: Not on file  . Frequency of Social Gatherings with Friends and Family: Not on file  . Attends Religious Services: Not on file  . Active Member of Clubs or Organizations: Not on file  . Attends Archivist Meetings: Not on file  . Marital Status: Not on file  Intimate Partner Violence:   . Fear of Current or Ex-Partner: Not on file  . Emotionally Abused: Not on file  . Physically Abused: Not on file  . Sexually Abused: Not on file   Family History  Problem Relation Age of Onset  . Breast cancer Mother 58  . Osteoarthritis Father   . Hypertension Father   . Leukemia Maternal Aunt 29  . Atrial fibrillation Brother   . Colon cancer Neg Hx   .  Esophageal cancer Neg Hx   . Inflammatory bowel disease Neg Hx   . Liver disease Neg Hx   . Pancreatic cancer Neg Hx   . Rectal cancer Neg Hx   . Stomach cancer Neg Hx    I have reviewed her medical, social, and family history in detail and updated the electronic medical record as necessary.    PHYSICAL EXAMINATION  BP 130/82   Pulse 81   Ht 5\' 6"  (1.676 m)   Wt 120 lb 4 oz (54.5 kg)   BMI 19.41 kg/m  GEN: NAD, appears younger than stated age, doesn't appear chronically ill PSYCH: Cooperative, without pressured speech EYE: Conjunctivae pink, sclerae anicteric ENT: MMM CV: Nontachycardic RESP: No audible wheezing GI: NT/ND MSK/EXT: No significant lower extremity edema SKIN: No jaundice NEURO:  Alert & Oriented x 3, no focal deficits   REVIEW OF DATA  I reviewed the following data at the time of this encounter:  GI Procedures and Studies  July 2021 pH impedance   July 2021 esophageal manometry   Laboratory Studies  Reviewed those in epic  Imaging Studies  No new relevant studies to review   ASSESSMENT  Ms. Clinkscales is a 69 y.o. female with a  pmh significant for COPD, hypothyroidism, allergic rhinitis, arthralgias, prior PUD, microscopic acid reflux changes, with evidence of ?Hypersensitive Esophagus +/- Functional Heartburn.  The patient is seen today for evaluation and management of:  1. Esophageal hypersensitivity   2. Gastroesophageal reflux disease without esophagitis   3. Functional heartburn    The patient is hemodynamically stable.  The patient has completed esophageal manometry and pH impedance testing.  Her acid reflux is well controlled as we did her testing on PPI therapy.  Her symptoms are consistent with belching.  The etiology of her persistent symptoms most likely at this time is functional in origin.  Nonerosive reflux disease is not completely defined as her symptoms were correlated with belching.  We are going to decrease her PPI therapy.  We had a long discussion about the potential role of TCAs for treatment of hypersensitive esophagus/functional heartburn.  The patient wants to discuss this with her primary care provider and her pharmacist.  I recommend either desipramine or amitriptyline as initial medication with consideration of further titration upwards over weeks to months.  Hopefully this neuromodulation will help with her discomfort.  We will also decrease her PPI to once daily therapy to minimize her long-term exposure to these medications.  We certainly can reconsider another endoscopy but she has had 2 within the last 2 years so I am not sure another will help guide [name] any further at this time.  The patient will let us know her thoughts on potential use of TCA in the future.   PLAN  Start transitioning omeprazole  -40 mg twice daily alternating with 40 mg daily for 2 weeks -40 mg once daily thereafter  Okay for Gaviscon use plus/minus Pepcid as needed Potential TCA use (desipramine or amitriptyline) for treatment of functional heartburn/hypersensitive esophagus No concerns with Celebrex use at this time if  needed for her to play pickle ball   No orders of the defined types were placed in this encounter.   New Prescriptions   No medications on file   Modified Medications   Modified Medication Previous Medication   CELECOXIB (CELEBREX) 100 MG CAPSULE celecoxib (CELEBREX) 100 MG capsule      TAKE ONE CAPSULE BY MOUTH TWICE A DAY    TAKE ONE CAPSULE  BY MOUTH TWICE A DAY    Planned Follow Up: No follow-ups on file.   Total Time in Face-to-Face and in Coordination of Care for patient including independent/personal interpretation/review of prior testing, medical history, examination, medication adjustment, communicating results with the patient directly, and documentation with the EHR is 25 minutes.   Justice Britain, MD [facility]oscopy Office # [phone]

## 2019-09-15 NOTE — Therapy (Signed)
[facility] [address], [place], [place], [address] Phone: [phone]   Fax:  [phone]  Physical Therapy Treatment  Patient Details  Name: Tina Lucas MRN: [id number] Date of Birth: [birth year] Referring Provider (PT): Binnie Rail, MD   Encounter Date: 09/15/2019   PT End of Session - 09/15/19 1153    Visit Number 7    Date for PT Re-Evaluation 10/09/19    PT Start Time 1150    PT Stop Time 1230    PT Time Calculation (min) 40 min    Activity Tolerance Patient tolerated treatment well    Behavior During Therapy [facility] for tasks assessed/performed           Past Medical History:  Diagnosis Date  . Allergic rhinitis   . Allergy   . Asthma    as a child  . 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 COPD (chronic obstructive pulmonary disease) ([place])   . History of asthma 1956-2000  . Hypothyroid 2013  . Osteopenia   . Primary HSV infection of mouth 2008  . Pulmonary nodule   . 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 [place] STUDY N/A 08/13/2019   Procedure: 24 HOUR PH STUDY- impedence;  Surgeon: Irving Copas., MD;  [facility]: [facility];  Service: Gastroenterology;  Laterality: N/A;  . COLONOSCOPY    . ESOPHAGEAL MANOMETRY N/A 08/13/2019   Procedure: ESOPHAGEAL MANOMETRY (EM);  Surgeon: Irving Copas., MD;  [facility];  Service: Gastroenterology;  Laterality: N/A;  . [place] IMPEDANCE STUDY  08/13/2019   Procedure: [place] IMPEDANCE STUDY;  Surgeon: Rush Landmark Telford Nab., MD;  Location[facility];  Service: Gastroenterology;;  . polyp on vocal chord     . TONSILLECTOMY  1958    There were no vitals filed for this visit.   Subjective Assessment - 09/15/19 1354    Subjective Pt states this week was better.  She was able to hit the  pickle ball and go to the gym.  Did a long 52 minute walk and back was okay just dull ache after    Patient Stated Goals reduce shoulder pain so she can walk, work out and maybe play pickle ball    Currently in Pain? No/denies                             [facility] Adult PT Treatment/Exercise - 09/15/19 0001      Exercises   Exercises Other Exercises    Other Exercises  demo of hitting pickle ball and reviewed exercises she can do around for endurance to return to playing      Manual Therapy   Manual therapy comments seated position    Joint Mobilization AP mobs to 3rd rib    Soft tissue mobilization pec maj/minor, subclavius    Myofascial Release along thoracic                    PT Short Term Goals - 09/08/19 1238      PT SHORT TERM GOAL #1   Title pt will be able to make it through 2 weeks of normal activity with at most one episode of pain in the upper back    Baseline only one day of severe pain much better this week; only one  walk but normal gym time    Status Partially Met             PT Long Term Goals - 07/17/19 1720      PT LONG TERM GOAL #1   Title pt will be ind with advanced HEP    Time 12    Period Weeks    Status New    Target Date 10/09/19      PT LONG TERM GOAL #2   Title Pt will report 50% less pain with working out at the gym    Time 12    Period Weeks    Status New    Target Date 10/09/19      PT LONG TERM GOAL #3   Title FOTO < or = to 38% limited    Baseline 49%    Time 12    Period Weeks    Status New    Target Date 10/09/19      PT LONG TERM GOAL #4   Title pt demonstrates improved cervical rotation to 60 deg bilaterally for improved ability to navigate traffic while driving    Baseline 55 deg right; 65 left    Time 12    Period Weeks    Status New    Target Date 10/09/19      PT LONG TERM GOAL #5   Title Pt will report 50% less muscle spasms in her neck so she can tolerate going for walks at least 3x/week  for 45 minutes or more without pain    Baseline sometimes only 30 minutes with pain    Time 12    Period Weeks    Status New    Target Date 10/09/19                 Plan - 09/15/19 1228    Clinical Impression Statement Pt still has tension Lt > Rt in thoracic and pec region.  She is responding well to [facility] and MFR.  pt was able to increase her workout and pickle ball activity.  Pt able to understand how to increase activity for endurance to return to pickle ball.    PT Treatment/Interventions ADLs/Self Care Home Management;Biofeedback;Cryotherapy;Electrical Stimulation;Moist Heat;Therapeutic activities;Therapeutic exercise;Neuromuscular re-education;Patient/family education;Manual techniques;Passive range of motion;Taping;Dry needling    PT Next Visit Plan f/u on side shuffles and pickle ball drills; STM and MFR to thoracic and pecs    PT Home Exercise Plan Access Code: [email or web address]    Consulted and Agree with Plan of Care Patient           Patient will benefit from skilled therapeutic intervention in order to improve the following deficits and impairments:  Increased fascial restricitons, Decreased range of motion, Decreased strength, Postural dysfunction, Pain, Difficulty walking  Visit Diagnosis: Other muscle spasm  Cervicalgia     Problem List Patient Active Problem List   Diagnosis Date Noted  . Belching   . Upper back pain on left side 07/15/2019  . Hiatal hernia 06/15/2019  . Functional heartburn 06/15/2019  . Hypersensitivity 06/15/2019  . Chest wall discomfort 07/22/2018  . Pyrosis 04/26/2018  . Positive colorectal cancer screening using Cologuard test 04/26/2018  . Gastric ulcer without hemorrhage or perforation 04/26/2018  . Abnormal findings on esophagogastroduodenoscopy (EGD) 04/26/2018  . Atypical chest pain 02/26/2018  . Asthma-COPD overlap syndrome ([place]) 01/31/2018  . Chronic neck pain 08/27/2017  . GERD (gastroesophageal reflux disease) 04/20/2016  .  Numbness 08/06/2015  . Osteopenia 12/10/2014  . Allergic urticaria  12/10/2014  . Pressure in head 04/01/2014  . Vertigo 02/10/2014  . Elevated blood pressure reading 11/06/2013  . Hypothyroid   . Primary HSV infection of mouth     Jule Ser, PT 09/15/2019, 2:59 PM  [facility] [address] [place], [place], [address] Phone: [phone]   Fax:  [phone]  Name: Tina Lucas MRN: [id number] Date of Birth: November 08, 1950

## 2019-09-22 NOTE — Progress Notes (Signed)
Subjective:    Patient ID: Tina Lucas, female    DOB: 21-Apr-1950, 69 y.o.   MRN: [id number]  HPI The patient is here for an acute visit to discuss changing medication.   She had an esophageal manometry and Ph impedance testing.  Her GERD was found to be well controlled on PPI testing.  Her belching was thought to be function in origin.  Her PPI was reduced.  He discussed TCAs for treatment of hypersensitive esophagus/functional heartburn. ( desipramine or amitriptyline recommended)   Taking pirlosec QD alternating with BID.  She did ok initially with decreased dose.  Friday afternoon felt a little heartburn.   Sun am had heartburn. Today - no GERD, but burping a lot.    She was not sure if this was rebound GERD and what she should do with the medications. She is unsure about the recommended meds.   Medications and allergies reviewed with patient and updated if appropriate.  Patient Active Problem List   Diagnosis Date Noted  . Belching   . Upper back pain on left side 07/15/2019  . Hiatal hernia 06/15/2019  . Functional heartburn 06/15/2019  . Hypersensitivity 06/15/2019  . Chest wall discomfort 07/22/2018  . Pyrosis 04/26/2018  . Positive colorectal cancer screening using Cologuard test 04/26/2018  . Gastric ulcer without hemorrhage or perforation 04/26/2018  . Abnormal findings on esophagogastroduodenoscopy (EGD) 04/26/2018  . Atypical chest pain 02/26/2018  . Asthma-COPD overlap syndrome ([place]) 01/31/2018  . Chronic neck pain 08/27/2017  . GERD (gastroesophageal reflux disease) 04/20/2016  . Numbness 08/06/2015  . Osteopenia 12/10/2014  . Allergic urticaria 12/10/2014  . Pressure in head 04/01/2014  . Vertigo 02/10/2014  . Elevated blood pressure reading 11/06/2013  . Hypothyroid   . Primary HSV infection of mouth     Current Outpatient Medications on File Prior to Visit  Medication Sig Dispense Refill  . Calcium Carbonate-Vitamin D (CALTRATE 600+D) 600-400  MG-UNIT per tablet Take 1 tablet by mouth daily.    . celecoxib (CELEBREX) 100 MG capsule TAKE ONE CAPSULE BY MOUTH TWICE A DAY 180 capsule 3  . cholecalciferol (VITAMIN D) 400 UNITS TABS tablet Take 400 Units by mouth.    . fexofenadine (ALLEGRA) 180 MG tablet Take 180 mg by mouth daily.    . fluocinonide cream (LIDEX) 7.06 % Apply 1 application topically 2 (two) times daily as needed (FOR PSORIASIS).     . hydrocortisone 2.5 % cream     . levalbuterol (XOPENEX HFA) 45 MCG/ACT inhaler Inhale 1-2 puffs into the lungs every 4 (four) hours as needed for wheezing. (Patient taking differently: Inhale 1-2 puffs into the lungs every 4 (four) hours as needed for wheezing. Pt takes 1-2 times a week) 1 Inhaler 6  . levothyroxine (SYNTHROID) 50 MCG tablet Take 1 tablet (50 mcg total) by mouth daily with breakfast. 90 tablet 3  . Magnesium 250 MG TABS Take 125 mg by mouth daily.    . mometasone-formoterol (DULERA) 200-5 MCG/ACT AERO Inhale 2 puffs into the lungs 2 (two) times daily. Appt needed for future refills. 13 g 11  . Multiple Vitamins-Minerals (ONE-A-DAY WOMENS 50+ ADVANTAGE PO) Take by mouth daily.    Marland Kitchen omeprazole (PRILOSEC) 40 MG capsule Take 1 capsule (40 mg total) by mouth 2 (two) times daily before a meal. 180 capsule 1  . prednisoLONE acetate (PRED FORTE) 1 % ophthalmic suspension Place 1 drop into the right eye daily. Pt takes 2 times per week    . triamcinolone (  KENALOG) 0.1 % paste      No current facility-administered medications on file prior to visit.    Past Medical History:  Diagnosis Date  . Allergic rhinitis   . Allergy   . Asthma    as a child  . 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 COPD (chronic obstructive pulmonary disease) ([place])   . History of asthma 1956-2000  . Hypothyroid 2013  . Osteopenia   . Primary HSV infection of mouth 2008  . Pulmonary nodule   . Sunlight-induced angio-edema-urticaria 07/11/2011   unknown trigger, neg  autoimmune workup    Past Surgical History:  Procedure Laterality Date  . [place] STUDY N/A 08/13/2019   Procedure: 24 HOUR PH STUDY- impedence;  Surgeon: Irving Copas., MD;  Location: [facility];  Service: Gastroenterology;  Laterality: N/A;  . COLONOSCOPY    . ESOPHAGEAL MANOMETRY N/A 08/13/2019   Procedure: ESOPHAGEAL MANOMETRY (EM);  Surgeon: Irving Copas., MD;  [facility];  Service: Gastroenterology;  Laterality: N/A;  . [place] IMPEDANCE STUDY  08/13/2019   Procedure: [place] IMPEDANCE STUDY;  Surgeon: Rush Landmark Telford Nab., MD;  Location: [facility];  Service: Gastroenterology;;  . polyp on vocal chord     . TONSILLECTOMY  1958    Social History   Socioeconomic History  . Marital status: Married    Spouse name: Not on file  . Number of children: Not on file  . Years of education: Not on file  . Highest education level: Not on file  Occupational History  . Not on file  Tobacco Use  . Smoking status: Never Smoker  . Smokeless tobacco: Never Used  Vaping Use  . Vaping Use: Never used  Substance and Sexual Activity  . Alcohol use: Not Currently    Alcohol/week: 0.0 standard drinks  . Drug use: No  . Sexual activity: Not on file  Other Topics Concern  . Not on file  Social History Narrative   Lives with husband in a one story home.  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   Social Determinants of Health   Financial Resource Strain:   . Difficulty of Paying Living Expenses: Not on file  Food Insecurity:   . Worried About Charity fundraiser in the Last Year: Not on file  . Ran Out of Food in the Last Year: Not on file  Transportation Needs:   . Lack of Transportation (Medical): Not on file  . Lack of Transportation (Non-Medical): Not on file  Physical Activity:   . Days of Exercise per Week: Not on file  . Minutes of Exercise per Session: Not on file  Stress:   . Feeling of Stress : Not on file  Social Connections:   .  Frequency of Communication with Friends and Family: Not on file  . Frequency of Social Gatherings with Friends and Family: Not on file  . Attends Religious Services: Not on file  . Active Member of Clubs or Organizations: Not on file  . Attends Archivist Meetings: Not on file  . Marital Status: Not on file    Family History  Problem Relation Age of Onset  . Breast cancer Mother 80  . Osteoarthritis Father   . Hypertension Father   . Leukemia Maternal Aunt 43  . Atrial fibrillation Brother   . Colon cancer Neg Hx   . Esophageal cancer Neg Hx   . Inflammatory bowel disease  Neg Hx   . Liver disease Neg Hx   . Pancreatic cancer Neg Hx   . Rectal cancer Neg Hx   . Stomach cancer Neg Hx     Review of Systems     Objective:   Vitals:   09/23/19 1112  BP: (!) 162/80  Pulse: 77  Temp: 98.5 F (36.9 C)  SpO2: 97%   BP Readings from Last 3 Encounters:  09/23/19 (!) 162/80  09/11/19 130/82  07/15/19 138/78   Wt Readings from Last 3 Encounters:  09/23/19 121 lb (54.9 kg)  09/11/19 120 lb 4 oz (54.5 kg)  07/15/19 118 lb (53.5 kg)   Body mass index is 19.53 kg/m.   Physical Exam Constitutional:      General: She is not in acute distress.    Appearance: Normal appearance. She is not ill-appearing.  Skin:    General: Skin is warm and dry.  Neurological:     Mental Status: She is alert.  Psychiatric:     Comments: anxious            Assessment & Plan:    See Problem List for Assessment and Plan of chronic medical problems.    This visit occurred during the SARS-CoV-2 public health emergency.  Safety protocols were in place, including screening questions prior to the visit, additional usage of staff PPE, and extensive cleaning of exam room while observing appropriate contact time as indicated for disinfecting solutions.

## 2019-09-23 ENCOUNTER — Encounter: Payer: Self-pay | Admitting: Internal Medicine

## 2019-09-23 ENCOUNTER — Other Ambulatory Visit: Payer: Self-pay

## 2019-09-23 ENCOUNTER — Ambulatory Visit: Payer: Medicare PPO | Admitting: Internal Medicine

## 2019-09-23 DIAGNOSIS — K219 Gastro-esophageal reflux disease without esophagitis: Secondary | ICD-10-CM | POA: Diagnosis not present

## 2019-09-23 NOTE — Patient Instructions (Addendum)
Continue to slowly decrease the prilosec.     We can consider amitriptyline in the future if you want.

## 2019-09-23 NOTE — Assessment & Plan Note (Addendum)
Chronic Recently saw GI and reviewed recent tests  Advised decreasing PPI - taking 1 alternating with 2 daily - has had some GERD symptoms  Discussed tapering slowly - can add in pepcid, advised to go slow and change medication according to her symptoms - this may take her a while Discussed TCA's - she will think about this but for now will continue the above. I will prescribe the TCA if she wants to try it - discussed side effects and dosage

## 2019-09-25 ENCOUNTER — Ambulatory Visit: Payer: Medicare PPO | Admitting: Physical Therapy

## 2019-09-26 ENCOUNTER — Telehealth: Payer: Self-pay | Admitting: Gastroenterology

## 2019-09-26 NOTE — Telephone Encounter (Signed)
FYI:  Dr Mansouraty  

## 2019-09-30 ENCOUNTER — Encounter: Payer: Self-pay | Admitting: Physical Therapy

## 2019-09-30 ENCOUNTER — Other Ambulatory Visit: Payer: Self-pay

## 2019-09-30 ENCOUNTER — Ambulatory Visit: Payer: Medicare PPO | Attending: Internal Medicine | Admitting: Physical Therapy

## 2019-09-30 DIAGNOSIS — M62838 Other muscle spasm: Secondary | ICD-10-CM | POA: Insufficient documentation

## 2019-09-30 DIAGNOSIS — M542 Cervicalgia: Secondary | ICD-10-CM | POA: Insufficient documentation

## 2019-09-30 NOTE — Telephone Encounter (Signed)
Thank you for the update. Happy to see her in follow-up as necessary. GM

## 2019-09-30 NOTE — Therapy (Signed)
[facility] [address], [address] [place], [place], [address] Phone: [phone]   Fax:  [phone]  Physical Therapy Treatment  Patient Details  Name: Tina Lucas MRN: [id number] Date of Birth: 10/13/1950 Referring Provider (PT): Burns, Stacy J, MD   Encounter Date: 09/30/2019   PT End of Session - 09/30/19 1451    Visit Number 8    Date for PT Re-Evaluation 10/09/19    PT Start Time 1402    PT Stop Time 1443    PT Time Calculation (min) 41 min    Activity Tolerance Patient tolerated treatment well    Behavior During Therapy WFL for tasks assessed/performed           Past Medical History:  Diagnosis Date  . Allergic rhinitis   . Allergy   . Asthma    as a child  . 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 COPD (chronic obstructive pulmonary disease) ([facility])   . History of asthma 1956-2000  . Hypothyroid 2013  . Osteopenia   . Primary HSV infection of mouth 2008  . Pulmonary nodule   . 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 24 HOUR PH STUDY N/A 08/13/2019   Procedure: 24 HOUR PH STUDY- impedence;  Surgeon: Mansouraty, Gabriel Jr., MD;  [facility];  Service: Gastroenterology;  Laterality: N/A;  . COLONOSCOPY    . ESOPHAGEAL MANOMETRY N/A 08/13/2019   Procedure: ESOPHAGEAL MANOMETRY (EM);  Surgeon: Mansouraty, Gabriel Jr., MD;  [facility];  Service: Gastroenterology;  Laterality: N/A;  . [place] IMPEDANCE STUDY  08/13/2019   Procedure: [place] IMPEDANCE STUDY;  Surgeon: Mansouraty, Gabriel Jr., MD;  [facility];  Service: Gastroenterology;;  . polyp on vocal chord     . TONSILLECTOMY  1958    There were no vitals filed for this visit.   Subjective Assessment - 09/30/19 1405    Subjective I have been hitting in the driveway 30-40 minutes or walking  and gym 2x/week.  The walking is better and only doing the 2 stretches.  i am warming up with the pickleball and pushing the distance more, but still not competing.    Patient Stated Goals reduce shoulder pain so she can walk, work out and maybe play pickle ball    Currently in Pain? Yes    Pain Score 8     Pain Location Scapula    Pain Orientation Right    Pain Descriptors / Indicators Sore    Pain Type Chronic pain    Pain Radiating Towards around the medial scap border    Pain Onset More than a month ago    Pain Frequency Intermittent    Aggravating Factors  maybe bending forward and then playing ball yesterday or how I slept    Pain Relieving Factors rows at the gym felt better, but then it came back    Multiple Pain Sites No                             [facility] Adult PT Treatment/Exercise - 09/30/19 0001      Manual Therapy   Manual therapy comments seated position    Joint Mobilization AP mobs to 3rd rib    Soft tissue mobilization teres minor and infraspinatus Rt side; bilat rhomboidpec maj/minor, subclavius                      PT Short Term Goals - 09/08/19 1238      PT SHORT TERM GOAL #1   Title pt will be able to make it through 2 weeks of normal activity with at most one episode of pain in the upper back    Baseline only one day of severe pain much better this week; only one walk but normal gym time    Status Partially Met             PT Long Term Goals - 07/17/19 1720      PT LONG TERM GOAL #1   Title pt will be ind with advanced HEP    Time 12    Period Weeks    Status New    Target Date 10/09/19      PT LONG TERM GOAL #2   Title Pt will report 50% less pain with working out at the gym    Time 12    Period Weeks    Status New    Target Date 10/09/19      PT LONG TERM GOAL #3   Title FOTO < or = to 38% limited    Baseline 49%    Time 12    Period Weeks    Status New    Target Date 10/09/19      PT LONG TERM GOAL #4    Title pt demonstrates improved cervical rotation to 60 deg bilaterally for improved ability to navigate traffic while driving    Baseline 55 deg right; 65 left    Time 12    Period Weeks    Status New    Target Date 10/09/19      PT LONG TERM GOAL #5   Title Pt will report 50% less muscle spasms in her neck so she can tolerate going for walks at least 3x/week for 45 minutes or more without pain    Baseline sometimes only 30 minutes with pain    Time 12    Period Weeks    Status New    Target Date 10/09/19                 Plan - 09/30/19 1451    Clinical Impression Statement Pt has been gradually increasing her activity. Pt responded well to STM again today.  HEP was discussed during hands on work.  Pt has her exercise routine down very well at home and needs minor adjustments as she progresses.  Emphasis is placed more on soft tissue things that she is unable to do on her own.  Pt does well with mobs to 3rd rib AP and trigger point release helped to elongate soft tissues teres minor and infraspinatus and periscapular muscles.    PT Treatment/Interventions ADLs/Self Care Home Management;Biofeedback;Cryotherapy;Electrical Stimulation;Moist Heat;Therapeutic activities;Therapeutic exercise;Neuromuscular re-education;Patient/family education;Manual techniques;Passive range of motion;Taping;Dry needling    PT Next Visit Plan RE-EVAL and get more visits from cohere has needed; STM and isometric thorac strength leaning on table if tolerated    PT Home Exercise Plan Access Code: [email or web address]    Consulted and Agree with Plan of Care Patient           Patient will benefit from skilled therapeutic intervention in order to improve the following deficits and impairments:  Increased fascial restricitons, Decreased range of motion, Decreased strength, Postural dysfunction, Pain, Difficulty walking  Visit Diagnosis: Other muscle spasm  Cervicalgia     Problem List Patient Active Problem List    Diagnosis Date Noted  . Belching   .   Upper back pain on left side 07/15/2019  . Hiatal hernia 06/15/2019  . Functional heartburn 06/15/2019  . Hypersensitivity 06/15/2019  . Chest wall discomfort 07/22/2018  . Pyrosis 04/26/2018  . Positive colorectal cancer screening using Cologuard test 04/26/2018  . Gastric ulcer without hemorrhage or perforation 04/26/2018  . Abnormal findings on esophagogastroduodenoscopy (EGD) 04/26/2018  . Atypical chest pain 02/26/2018  . Asthma-COPD overlap syndrome ([facility]) 01/31/2018  . Chronic neck pain 08/27/2017  . GERD (gastroesophageal reflux disease) 04/20/2016  . Numbness 08/06/2015  . Osteopenia 12/10/2014  . Allergic urticaria 12/10/2014  . Pressure in head 04/01/2014  . Vertigo 02/10/2014  . Elevated blood pressure reading 11/06/2013  . Hypothyroid   . Primary HSV infection of mouth     Jakki L Desenglau, PT 09/30/2019, 3:07 PM  [facility] [address], [address] [place], [place], [address] Phone: [phone]   Fax:  [phone]  Name: Tina Lucas MRN: [id number] Date of Birth: 09/11/1950   

## 2019-10-02 DIAGNOSIS — Z961 Presence of intraocular lens: Secondary | ICD-10-CM | POA: Diagnosis not present

## 2019-10-02 DIAGNOSIS — H04123 Dry eye syndrome of bilateral lacrimal glands: Secondary | ICD-10-CM | POA: Diagnosis not present

## 2019-10-07 ENCOUNTER — Encounter: Payer: Self-pay | Admitting: Physical Therapy

## 2019-10-07 ENCOUNTER — Ambulatory Visit: Payer: Medicare PPO | Admitting: Physical Therapy

## 2019-10-07 DIAGNOSIS — M62838 Other muscle spasm: Secondary | ICD-10-CM

## 2019-10-07 DIAGNOSIS — M542 Cervicalgia: Secondary | ICD-10-CM

## 2019-10-07 NOTE — Therapy (Signed)
[facility] [address] [place], [place], [address] Phone: [phone]   Fax:  [phone]  Physical Therapy Treatment  Patient Details  Name: Tina Lucas MRN: [id number] Date of Birth: November 24, 1950 Referring Provider (PT): Binnie Rail, MD   Encounter Date: 10/07/2019   PT End of Session - 10/07/19 1158    Visit Number 9    Date for PT Re-Evaluation 10/09/19    PT Start Time 1148    PT Stop Time 1230    PT Time Calculation (min) 42 min    Activity Tolerance Patient tolerated treatment well    Behavior During Therapy [facility] for tasks assessed/performed           Past Medical History:  Diagnosis Date  . Allergic rhinitis   . Allergy   . Asthma    as a child  . 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 COPD (chronic obstructive pulmonary disease) ([place])   . History of asthma 1956-2000  . Hypothyroid 2013  . Osteopenia   . Primary HSV infection of mouth 2008  . Pulmonary nodule   . 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 [place] STUDY N/A 08/13/2019   Procedure: 24 HOUR PH STUDY- impedence;  Surgeon: Irving Copas., MD;  [facility];  Service: Gastroenterology;  Laterality: N/A;  . COLONOSCOPY    . ESOPHAGEAL MANOMETRY N/A 08/13/2019   Procedure: ESOPHAGEAL MANOMETRY (EM);  Surgeon: Irving Copas., MD;  [facility];  Service: Gastroenterology;  Laterality: N/A;  . [place] IMPEDANCE STUDY  08/13/2019   Procedure: [place] IMPEDANCE STUDY;  Surgeon: Rush Landmark Telford Nab., MD;  [facility];  Service: Gastroenterology;;  . polyp on vocal chord     . TONSILLECTOMY  1958    There were no vitals filed for this visit.   Subjective Assessment - 10/07/19 1148    Subjective I walked Saturday, back was okay.  I did stretches but if I  have been okay I haven't done them as much.  Friday I played with the rec group pickle ball and worked out 2x last week.  I played 3 matches and was very winded but felt okay in my back.  Sunday I was off from exercises.  Yesterday (Monday), I went back to [employer] and played 4 games and felt fine and not as winded.  Today I did my gym workout.  So far it feels like this is what my body wants to do    Currently in Pain? No/denies              [facility] PT Assessment - 10/08/19 0001      Assessment   Medical Diagnosis M54.9 (ICD-10-CM) - Upper back pain on left side; vertigo    Referring Provider (PT) Quay Burow, Claudina Lick, MD      Observation/Other Assessments   Focus on Therapeutic Outcomes (FOTO)  39% down from 48% limited                         [facility] Adult PT Treatment/Exercise - 10/08/19 0001      Manual Therapy   Manual therapy comments semi- reclined    Myofascial Release cervical traction and release along posterior fascia from cervical down to thoracic; shoulder girdle fascial release  PT Short Term Goals - 09/08/19 1238      PT SHORT TERM GOAL #1   Title pt will be able to make it through 2 weeks of normal activity with at most one episode of pain in the upper back    Baseline only one day of severe pain much better this week; only one walk but normal gym time    Status Partially Met             PT Long Term Goals - 10/07/19 1200      PT LONG TERM GOAL #1   Title pt will be ind with advanced HEP    Time 8    Period Weeks    Status On-going    Target Date 12/09/19      PT LONG TERM GOAL #2   Title Pt will report 50% less pain with working out at the gym    Baseline no pain currently    Time 8    Period Weeks    Status Achieved    Target Date 12/09/19      PT LONG TERM GOAL #3   Title FOTO < or = to 38% limited    Baseline 39%    Time 8    Period Weeks    Status Partially Met    Target Date 12/09/19      PT LONG TERM  GOAL #4   Title pt demonstrates improved cervical rotation to 60 deg bilaterally for improved ability to navigate traffic while driving    Baseline 55 deg right; 65 left    Time 8    Period Weeks    Status On-going    Target Date 12/09/19      PT LONG TERM GOAL #5   Title Pt will report 50% less muscle spasms in her neck so she can tolerate going for walks at least 3x/week for 45 minutes or more without pain    Baseline walking an hour without pain 2/wee    Time 8    Period Weeks    Status Partially Met    Target Date 12/09/19                 Plan - 10/08/19 1709    Clinical Impression Statement Pt has been making steady progress.  She is finally getting back to easy games of pickle ball this week.  Due to other issues and her joint pain, it would not be recommended to discontinue PT until she gets at least 3-4 more weeks of working on strength and pain management as she returns.  pt has improved based on FOTO 39% limited.  She has less pain and is more active in her daily routine.  Pt will benefit from skilled PT to continue to address impairments and return to full functional activities.    PT Treatment/Interventions ADLs/Self Care Home Management;Biofeedback;Cryotherapy;Electrical Stimulation;Moist Heat;Therapeutic activities;Therapeutic exercise;Neuromuscular re-education;Patient/family education;Manual techniques;Passive range of motion;Taping;Dry needling    PT Next Visit Plan STM/MFR as needed to get patient back to pickel ball routine; thoracic strength leaning on table forearms if tolerated    PT Home Exercise Plan Access Code: [email or web address]    Consulted and Agree with Plan of Care Patient           Patient will benefit from skilled therapeutic intervention in order to improve the following deficits and impairments:  Increased fascial restricitons, Decreased range of motion, Decreased strength, Postural dysfunction, Pain, Difficulty walking  Visit Diagnosis: Other muscle  spasm  Cervicalgia     Problem List Patient Active Problem List   Diagnosis Date Noted  . Belching   . Upper back pain on left side 07/15/2019  . Hiatal hernia 06/15/2019  . Functional heartburn 06/15/2019  . Hypersensitivity 06/15/2019  . Chest wall discomfort 07/22/2018  . Pyrosis 04/26/2018  . Positive colorectal cancer screening using Cologuard test 04/26/2018  . Gastric ulcer without hemorrhage or perforation 04/26/2018  . Abnormal findings on esophagogastroduodenoscopy (EGD) 04/26/2018  . Atypical chest pain 02/26/2018  . Asthma-COPD overlap syndrome ([place]) 01/31/2018  . Chronic neck pain 08/27/2017  . GERD (gastroesophageal reflux disease) 04/20/2016  . Numbness 08/06/2015  . Osteopenia 12/10/2014  . Allergic urticaria 12/10/2014  . Pressure in head 04/01/2014  . Vertigo 02/10/2014  . Elevated blood pressure reading 11/06/2013  . Hypothyroid   . Primary HSV infection of mouth     Jule Ser, PT 10/08/2019, 5:21 PM  [facility] [address], [place], [place], [address] Phone: [phone]   Fax:  [phone]  Name: Tina Lucas MRN: [id number] Date of Birth: [birth year]

## 2019-10-13 ENCOUNTER — Ambulatory Visit: Payer: Medicare PPO | Admitting: Physical Therapy

## 2019-10-13 ENCOUNTER — Encounter: Payer: Self-pay | Admitting: Podiatry

## 2019-10-13 ENCOUNTER — Encounter: Payer: Self-pay | Admitting: Physical Therapy

## 2019-10-13 ENCOUNTER — Other Ambulatory Visit: Payer: Self-pay

## 2019-10-13 ENCOUNTER — Ambulatory Visit: Payer: Medicare PPO | Admitting: Podiatry

## 2019-10-13 DIAGNOSIS — M542 Cervicalgia: Secondary | ICD-10-CM

## 2019-10-13 DIAGNOSIS — M79674 Pain in right toe(s): Secondary | ICD-10-CM

## 2019-10-13 DIAGNOSIS — M79675 Pain in left toe(s): Secondary | ICD-10-CM | POA: Diagnosis not present

## 2019-10-13 DIAGNOSIS — B351 Tinea unguium: Secondary | ICD-10-CM

## 2019-10-13 DIAGNOSIS — M62838 Other muscle spasm: Secondary | ICD-10-CM | POA: Diagnosis not present

## 2019-10-13 DIAGNOSIS — Q828 Other specified congenital malformations of skin: Secondary | ICD-10-CM

## 2019-10-13 NOTE — Patient Instructions (Signed)
Access Code: [email or web address] URL: [email or web address] Date: 10/13/2019 Prepared by: Jari Favre  Exercises Standing Lumbar Extension at [place] - 2 x daily - 7 x weekly - 1 sets - 5 reps - 20 hold TL Sidebending Stretch - Arms Overhead - 1 x daily - 7 x weekly - 3 sets - 10 reps Seated Shoulder Cross Body Stretch - 1 x daily - 7 x weekly - 3 sets - 10 reps Standing Shoulder Abduction Slides at Wall - 1 x daily - 7 x weekly - 1 sets - 10 reps Seated Hip Abduction with Resistance - 1 x daily - 7 x weekly - 3 sets - 10 reps Seated Hamstring Stretch - 1 x daily - 7 x weekly - 3 reps - 1 sets - 30 sec hold Seated Figure 4 Piriformis Stretch - 1 x daily - 7 x weekly - 3 reps - 1 sets - 30 hold Hip Extension with Single Leg Support Prone on Table Edge - 1 x daily - 7 x weekly - 3 sets - 10 reps

## 2019-10-13 NOTE — Therapy (Signed)
[facility] [address], [place], [place], [address] Phone: [phone]   Fax:  [phone]  Physical Therapy Treatment Progress Note Reporting Period 07/17/19 to 10/13/19   See note below for Objective Data and Assessment of Progress/Goals.      Patient Details  Name: Tina Lucas MRN: [id number] Date of Birth: 03-14-1950 Referring Provider (PT): Binnie Rail, MD   Encounter Date: 10/13/2019   PT End of Session - 10/13/19 1706    Visit Number 10    Date for PT Re-Evaluation 12/09/19    PT Start Time 1442    PT Stop Time 1531    PT Time Calculation (min) 49 min    Activity Tolerance Patient tolerated treatment well    Behavior During Therapy [facility] for tasks assessed/performed           Past Medical History:  Diagnosis Date  . Allergic rhinitis   . Allergy   . Asthma    as a child  . 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 COPD (chronic obstructive pulmonary disease) ([place])   . History of asthma 1956-2000  . Hypothyroid 2013  . Osteopenia   . Primary HSV infection of mouth 2008  . Pulmonary nodule   . 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 [place] STUDY N/A 08/13/2019   Procedure: 24 HOUR PH STUDY- impedence;  Surgeon: Irving Copas., MD;  Location: [facility];  Service: Gastroenterology;  Laterality: N/A;  . COLONOSCOPY    . ESOPHAGEAL MANOMETRY N/A 08/13/2019   Procedure: ESOPHAGEAL MANOMETRY (EM);  Surgeon: Irving Copas., MD;  Location: [facility];  Service: Gastroenterology;  Laterality: N/A;  . [place] IMPEDANCE STUDY  08/13/2019   Procedure: [place] IMPEDANCE STUDY;  Surgeon: Rush Landmark Telford Nab., MD;  Location: [facility];  Service: Gastroenterology;;  . polyp on vocal chord     . TONSILLECTOMY  1958    There were no vitals filed  for this visit.   Subjective Assessment - 10/13/19 1518    Subjective Pt states head and cervicalgia was much better after previous visit.  Pt had glute strain after playing pickle ball.    Patient Stated Goals reduce shoulder pain so she can walk, work out and maybe play pickle ball    Currently in Pain? Yes    Pain Score 8     Pain Location Hip    Pain Orientation Right    Pain Descriptors / Indicators Aching    Pain Type Acute pain    Multiple Pain Sites No                             [facility] Adult PT Treatment/Exercise - 10/13/19 0001      Lumbar Exercises: Stretches   Active Hamstring Stretch Right;Left;1 rep;20 seconds    Figure 4 Stretch 2 reps;20 seconds      Lumbar Exercises: Standing   Other Standing Lumbar Exercises modified qped forearms on table - LE ext      Lumbar Exercises: Seated   Other Seated Lumbar Exercises clam with cues for core activation and posture alignment      Manual Therapy   Myofascial Release addaday for lumbar and gluteal fascial release Rt side  PT Education - 10/13/19 1733    Education Details Access Code: [email or web address]    Person(s) Educated Patient    Methods Explanation;Demonstration;Verbal cues;Handout;Tactile cues    Comprehension Verbalized understanding;Returned demonstration            PT Short Term Goals - 09/08/19 1238      PT SHORT TERM GOAL #1   Title pt will be able to make it through 2 weeks of normal activity with at most one episode of pain in the upper back    Baseline only one day of severe pain much better this week; only one walk but normal gym time    Status Partially Met             PT Long Term Goals - 10/07/19 1200      PT LONG TERM GOAL #1   Title pt will be ind with advanced HEP    Time 8    Period Weeks    Status On-going    Target Date 12/09/19      PT LONG TERM GOAL #2   Title Pt will report 50% less pain with working out at the gym    Baseline no pain  currently    Time 8    Period Weeks    Status Achieved    Target Date 12/09/19      PT LONG TERM GOAL #3   Title FOTO < or = to 38% limited    Baseline 39%    Time 8    Period Weeks    Status Partially Met    Target Date 12/09/19      PT LONG TERM GOAL #4   Title pt demonstrates improved cervical rotation to 60 deg bilaterally for improved ability to navigate traffic while driving    Baseline 55 deg right; 65 left    Time 8    Period Weeks    Status On-going    Target Date 12/09/19      PT LONG TERM GOAL #5   Title Pt will report 50% less muscle spasms in her neck so she can tolerate going for walks at least 3x/week for 45 minutes or more without pain    Baseline walking an hour without pain 2/wee    Time 8    Period Weeks    Status Partially Met    Target Date 12/09/19                 Plan - 10/13/19 1519    Clinical Impression Statement Goals up to date last session and no changes today other than she has done more pickle ball this last week.  Today's session focused on core strength for improved stability during return to sport.  Pt is returning to playing more pickle ball and feeling better in head/neck/upper back.  She continues to have posture deficits and core weakness which has been more noticeable in the pelvic and hip region since her return to greater activity level.  Pt was given core strengthening and stretching to add to the HEP today.  Pt had no increased pain after today's session.  She will benefit from skilled PT to continue to work on strength.    PT Treatment/Interventions ADLs/Self Care Home Management;Biofeedback;Cryotherapy;Electrical Stimulation;Moist Heat;Therapeutic activities;Therapeutic exercise;Neuromuscular re-education;Patient/family education;Manual techniques;Passive range of motion;Taping;Dry needling    PT Next Visit Plan STM/MFR as needed to get patient back to pickel ball routine; continue thoracic shoulder girdle, core and pelvic girdle  strength as tolerated; pelvic mobility  for reduced stresses being transferred up the kinetic chain    PT Home Exercise Plan Access Code: [email or web address]    Consulted and Agree with Plan of Care Patient           Patient will benefit from skilled therapeutic intervention in order to improve the following deficits and impairments:  Increased fascial restricitons, Decreased range of motion, Decreased strength, Postural dysfunction, Pain, Difficulty walking  Visit Diagnosis: Other muscle spasm  Cervicalgia     Problem List Patient Active Problem List   Diagnosis Date Noted  . Belching   . Upper back pain on left side 07/15/2019  . Hiatal hernia 06/15/2019  . Functional heartburn 06/15/2019  . Hypersensitivity 06/15/2019  . Chest wall discomfort 07/22/2018  . Pyrosis 04/26/2018  . Positive colorectal cancer screening using Cologuard test 04/26/2018  . Gastric ulcer without hemorrhage or perforation 04/26/2018  . Abnormal findings on esophagogastroduodenoscopy (EGD) 04/26/2018  . Atypical chest pain 02/26/2018  . Asthma-COPD overlap syndrome ([place]) 01/31/2018  . Chronic neck pain 08/27/2017  . GERD (gastroesophageal reflux disease) 04/20/2016  . Numbness 08/06/2015  . Osteopenia 12/10/2014  . Allergic urticaria 12/10/2014  . Pressure in head 04/01/2014  . Vertigo 02/10/2014  . Elevated blood pressure reading 11/06/2013  . Hypothyroid   . Primary HSV infection of mouth     Jule Ser, PT 10/13/2019, 5:33 PM  [facility] [address], [place], [place], [address] Phone: [phone]   Fax:  [phone]  Name: Tina Lucas MRN: [id number] Date of Birth: Apr 03, 1950

## 2019-10-15 ENCOUNTER — Other Ambulatory Visit: Payer: Self-pay | Admitting: Pulmonary Disease

## 2019-10-15 NOTE — Progress Notes (Signed)
Subjective: Tina Lucas is a 69 y.o. female patient seen today corn(s) right hallux and right 2nd digits, interdigitally, and painful mycotic toenails b/l that are difficult to trim. Pain interferes with ambulation. Aggravating factors include wearing enclosed shoe gear. Pain is relieved with periodic professional debridement.   She continues to enjoy her extracurricular activity of playing [employer]. She has brought a magazine in for me to read.  She voices no new pedal concerns on today's visit.  Patient Active Problem List   Diagnosis Date Noted  . Belching   . Upper back pain on left side 07/15/2019  . Hiatal hernia 06/15/2019  . Functional heartburn 06/15/2019  . Hypersensitivity 06/15/2019  . Chest wall discomfort 07/22/2018  . Pyrosis 04/26/2018  . Positive colorectal cancer screening using Cologuard test 04/26/2018  . Gastric ulcer without hemorrhage or perforation 04/26/2018  . Abnormal findings on esophagogastroduodenoscopy (EGD) 04/26/2018  . Atypical chest pain 02/26/2018  . Asthma-COPD overlap syndrome ([place]) 01/31/2018  . Chronic neck pain 08/27/2017  . GERD (gastroesophageal reflux disease) 04/20/2016  . Numbness 08/06/2015  . Osteopenia 12/10/2014  . Allergic urticaria 12/10/2014  . Pressure in head 04/01/2014  . Vertigo 02/10/2014  . Elevated blood pressure reading 11/06/2013  . Hypothyroid   . Primary HSV infection of mouth     Current Outpatient Medications on File Prior to Visit  Medication Sig Dispense Refill  . Calcium Carbonate-Vitamin D (CALTRATE 600+D) 600-400 MG-UNIT per tablet Take 1 tablet by mouth daily.    . celecoxib (CELEBREX) 100 MG capsule TAKE ONE CAPSULE BY MOUTH TWICE A DAY 180 capsule 3  . cholecalciferol (VITAMIN D) 400 UNITS TABS tablet Take 400 Units by mouth.    . fexofenadine (ALLEGRA) 180 MG tablet Take 180 mg by mouth daily.    . fluocinonide cream (LIDEX) 3.50 % Apply 1 application topically 2 (two) times daily as needed (FOR  PSORIASIS).     . hydrocortisone 2.5 % cream     . levalbuterol (XOPENEX HFA) 45 MCG/ACT inhaler Inhale 1-2 puffs into the lungs every 4 (four) hours as needed for wheezing. (Patient taking differently: Inhale 1-2 puffs into the lungs every 4 (four) hours as needed for wheezing. Pt takes 1-2 times a week) 1 Inhaler 6  . levothyroxine (SYNTHROID) 50 MCG tablet Take 1 tablet (50 mcg total) by mouth daily with breakfast. 90 tablet 3  . Magnesium 250 MG TABS Take 125 mg by mouth daily.    . mometasone-formoterol (DULERA) 200-5 MCG/ACT AERO Inhale 2 puffs into the lungs 2 (two) times daily. Appt needed for future refills. 13 g 11  . Multiple Vitamins-Minerals (ONE-A-DAY WOMENS 50+ ADVANTAGE PO) Take by mouth daily.    Marland Kitchen omeprazole (PRILOSEC) 40 MG capsule Take 1 capsule (40 mg total) by mouth 2 (two) times daily before a meal. 180 capsule 1  . prednisoLONE acetate (PRED FORTE) 1 % ophthalmic suspension Place 1 drop into the right eye daily. Pt takes 2 times per week    . triamcinolone (KENALOG) 0.1 % paste      No current facility-administered medications on file prior to visit.    Allergies  Allergen Reactions  . Singulair [Montelukast Sodium] Other (See Comments)    headaches    Objective: Physical Exam General: Tina Lucas is a pleasant 69 y.o.  Caucasian female, WD, WN in NAD. AAO x 3.   Vascular:  Capillary refill time to digits immediate b/l. Palpable DP pulses b/l. Palpable PT pulses b/l. Pedal hair present b/l. Skin temperature  gradient within normal limits b/l. No edema noted b/l.  Dermatological:  Pedal skin with normal turgor, texture and tone bilaterally. No open wounds bilaterally. No interdigital macerations bilaterally. Toenails 1-5 b/l elongated, discolored, dystrophic, thickened, crumbly with subungual debris and tenderness to dorsal palpation. Porokeratotic lesion(s) L hallux and R 2nd toe. No erythema, no edema, no drainage, no flocculence.  Musculoskeletal:  Normal  muscle strength 5/5 to all lower extremity muscle groups bilaterally. No gross bony deformities bilaterally. No pain crepitus or joint limitation noted with ROM b/l.  Neurological:  Protective sensation intact 5/5 intact bilaterally with 10g monofilament b/l. Vibratory sensation intact b/l.  Assessment and Plan:  1. Pain due to onychomycosis of toenails of both feet   2. Porokeratosis   3. Pain in right toe(s)    -Examined patient. -No new findings. No new orders. -Toenails 1-5 b/l were debrided in length and girth with sterile nail nippers and dremel without iatrogenic bleeding.  -Corn(s) L hallux and R 2nd toe pared utilizing sterile scalpel blade without complication or incident. Total number debrided=2. -Patient to report any pedal injuries to medical professional immediately. -Patient to continue soft, supportive shoe gear daily. -Patient/POA to call should there be question/concern in the interim.  Return in about 4 months (around 02/12/2020) for toenail debridement w/corn(s)/callus(es).  Marzetta Board, DPM

## 2019-10-16 ENCOUNTER — Ambulatory Visit: Payer: Medicare PPO | Admitting: Physical Therapy

## 2019-10-20 NOTE — Progress Notes (Signed)
Subjective:    Patient ID: Tina Lucas, female    DOB: 05-Mar-1950, 69 y.o.   MRN: [id number]  HPI The patient is here for an acute visit.   Right hip pain: Her symptoms started 9/17 when she was playing pickle ball.  She went to get a low ball in bent her right leg and felt an instant pain in her right buttock almost like someone hit her with a ball there. She continued to play and did ok.  She has had pain since then.  Most of the pain is focused in the right butt, but she does feel some lateral hip pain at times.  She denies any radiation down the leg, numbness or tingling.  She denies lower back pain.  She has pain with all activity - sitting, laying and exercising.  She has played pickelball and walked at varying intensities to see if that made it worse.  She has seen PT and had been doing exercises.        Medications and allergies reviewed with patient and updated if appropriate.  Patient Active Problem List   Diagnosis Date Noted  . Belching   . Upper back pain on left side 07/15/2019  . Hiatal hernia 06/15/2019  . Functional heartburn 06/15/2019  . Hypersensitivity 06/15/2019  . Chest wall discomfort 07/22/2018  . Pyrosis 04/26/2018  . Positive colorectal cancer screening using Cologuard test 04/26/2018  . Gastric ulcer without hemorrhage or perforation 04/26/2018  . Abnormal findings on esophagogastroduodenoscopy (EGD) 04/26/2018  . Atypical chest pain 02/26/2018  . Asthma-COPD overlap syndrome ([place]) 01/31/2018  . Chronic neck pain 08/27/2017  . GERD (gastroesophageal reflux disease) 04/20/2016  . Numbness 08/06/2015  . Osteopenia 12/10/2014  . Allergic urticaria 12/10/2014  . Pressure in head 04/01/2014  . Vertigo 02/10/2014  . Elevated blood pressure reading 11/06/2013  . Hypothyroid   . Primary HSV infection of mouth     Current Outpatient Medications on File Prior to Visit  Medication Sig Dispense Refill  . Calcium Carbonate-Vitamin D (CALTRATE 600+D)  600-400 MG-UNIT per tablet Take 1 tablet by mouth daily.    . celecoxib (CELEBREX) 100 MG capsule TAKE ONE CAPSULE BY MOUTH TWICE A DAY 180 capsule 3  . cholecalciferol (VITAMIN D) 400 UNITS TABS tablet Take 400 Units by mouth.    . fexofenadine (ALLEGRA) 180 MG tablet Take 180 mg by mouth daily.    . fluocinonide cream (LIDEX) 4.28 % Apply 1 application topically 2 (two) times daily as needed (FOR PSORIASIS).     . hydrocortisone 2.5 % cream     . levalbuterol (XOPENEX HFA) 45 MCG/ACT inhaler INHALE ONE TO TWO PUFFS BY MOUTH EVERY 4 HOURS AS NEEDED FOR WHEEZING 15 g 2  . levothyroxine (SYNTHROID) 50 MCG tablet Take 1 tablet (50 mcg total) by mouth daily with breakfast. 90 tablet 3  . Magnesium 250 MG TABS Take 125 mg by mouth daily.    . mometasone-formoterol (DULERA) 200-5 MCG/ACT AERO Inhale 2 puffs into the lungs 2 (two) times daily. Appt needed for future refills. 13 g 11  . Multiple Vitamins-Minerals (ONE-A-DAY WOMENS 50+ ADVANTAGE PO) Take by mouth daily.    Marland Kitchen omeprazole (PRILOSEC) 40 MG capsule Take 1 capsule (40 mg total) by mouth 2 (two) times daily before a meal. 180 capsule 1  . prednisoLONE acetate (PRED FORTE) 1 % ophthalmic suspension Place 1 drop into the right eye daily. Pt takes 2 times per week    . triamcinolone (KENALOG) 0.1 %  paste      No current facility-administered medications on file prior to visit.    Past Medical History:  Diagnosis Date  . Allergic rhinitis   . Allergy   . Asthma    as a child  . 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 COPD (chronic obstructive pulmonary disease) ([place])   . History of asthma 1956-2000  . Hypothyroid 2013  . Osteopenia   . Primary HSV infection of mouth 2008  . Pulmonary nodule   . 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 [place] STUDY N/A 08/13/2019   Procedure: 24 HOUR PH  STUDY- impedence;  Surgeon: Irving Copas., MD;  [facility];  Service: Gastroenterology;  Laterality: N/A;  . COLONOSCOPY    . ESOPHAGEAL MANOMETRY N/A 08/13/2019   Procedure: ESOPHAGEAL MANOMETRY (EM);  Surgeon: Irving Copas., MD;  [facility];  Service: Gastroenterology;  Laterality: N/A;  . [place] IMPEDANCE STUDY  08/13/2019   Procedure: [place] IMPEDANCE STUDY;  Surgeon: Rush Landmark Telford Nab., MD;  [facility];  Service: Gastroenterology;;  . polyp on vocal chord     . TONSILLECTOMY  1958    Social History   Socioeconomic History  . Marital status: Married    Spouse name: Not on file  . Number of children: Not on file  . Years of education: Not on file  . Highest education level: Not on file  Occupational History  . Not on file  Tobacco Use  . Smoking status: Never Smoker  . Smokeless tobacco: Never Used  Vaping Use  . Vaping Use: Never used  Substance and Sexual Activity  . Alcohol use: Not Currently    Alcohol/week: 0.0 standard drinks  . Drug use: No  . Sexual activity: Not on file  Other Topics Concern  . Not on file  Social History Narrative   Lives with husband in a one story home.  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   Social Determinants of Health   Financial Resource Strain:   . Difficulty of Paying Living Expenses: Not on file  Food Insecurity:   . Worried About Charity fundraiser in the Last Year: Not on file  . Ran Out of Food in the Last Year: Not on file  Transportation Needs:   . Lack of Transportation (Medical): Not on file  . Lack of Transportation (Non-Medical): Not on file  Physical Activity:   . Days of Exercise per Week: Not on file  . Minutes of Exercise per Session: Not on file  Stress:   . Feeling of Stress : Not on file  Social Connections:   . Frequency of Communication with Friends and Family: Not on file  . Frequency of Social Gatherings with Friends and Family: Not on file   . Attends Religious Services: Not on file  . Active Member of Clubs or Organizations: Not on file  . Attends Archivist Meetings: Not on file  . Marital Status: Not on file    Family History  Problem Relation Age of Onset  . Breast cancer Mother 46  . Osteoarthritis Father   . Hypertension Father   . Leukemia Maternal Aunt 34  . Atrial fibrillation Brother   . Colon cancer Neg Hx   . Esophageal cancer Neg Hx   . Inflammatory bowel disease Neg Hx   .  Liver disease Neg Hx   . Pancreatic cancer Neg Hx   . Rectal cancer Neg Hx   . Stomach cancer Neg Hx     Review of Systems     Objective:   Vitals:   10/21/19 1404  BP: (!) 142/70  Pulse: 67  Temp: 98.1 F (36.7 C)  SpO2: 98%   BP Readings from Last 3 Encounters:  10/21/19 (!) 142/70  09/23/19 (!) 162/80  09/11/19 130/82   Wt Readings from Last 3 Encounters:  10/21/19 119 lb 12.8 oz (54.3 kg)  09/23/19 121 lb (54.9 kg)  09/11/19 120 lb 4 oz (54.5 kg)   Body mass index is 19.34 kg/m.   Physical Exam Constitutional:      General: She is not in acute distress.    Appearance: Normal appearance. She is not ill-appearing.  HENT:     Head: Normocephalic and atraumatic.  Musculoskeletal:        General: Tenderness (tenderness in R butt - mild, no lower back pain or lateral R hip tenderness.  pain with certain movements focused in R but) present.  Skin:    General: Skin is warm and dry.  Neurological:     Mental Status: She is alert.            Assessment & Plan:    See Problem List for Assessment and Plan of chronic medical problems.    This visit occurred during the SARS-CoV-2 public health emergency.  Safety protocols were in place, including screening questions prior to the visit, additional usage of staff PPE, and extensive cleaning of exam room while observing appropriate contact time as indicated for disinfecting solutions.

## 2019-10-21 ENCOUNTER — Other Ambulatory Visit: Payer: Self-pay

## 2019-10-21 ENCOUNTER — Ambulatory Visit: Payer: Medicare PPO | Admitting: Internal Medicine

## 2019-10-21 ENCOUNTER — Encounter: Payer: Self-pay | Admitting: Internal Medicine

## 2019-10-21 VITALS — BP 142/70 | HR 67 | Temp 98.1°F | Wt 119.8 lb

## 2019-10-21 DIAGNOSIS — M7918 Myalgia, other site: Secondary | ICD-10-CM | POA: Diagnosis not present

## 2019-10-21 NOTE — Patient Instructions (Addendum)
A referral was ordered for orthopedics - Dr Junius Roads.

## 2019-10-21 NOTE — Assessment & Plan Note (Signed)
Acute - started 9/17 when playing pickleball ? Piriformis or gluteal injury/tear Radiates to lateral R hip Has seen PT and is doing exercises Will refer to Dr Junius Roads

## 2019-10-22 ENCOUNTER — Encounter: Payer: Self-pay | Admitting: Family Medicine

## 2019-10-22 ENCOUNTER — Ambulatory Visit: Payer: Medicare PPO | Admitting: Family Medicine

## 2019-10-22 ENCOUNTER — Ambulatory Visit: Payer: Self-pay

## 2019-10-22 DIAGNOSIS — M7918 Myalgia, other site: Secondary | ICD-10-CM | POA: Diagnosis not present

## 2019-10-22 NOTE — Progress Notes (Signed)
I saw and examined the patient with Dr. Elouise Munroe and agree with assessment and plan as outlined.    Right hip pain since 9/17 when injured playing pickleball.  Tender posterolateral hip.  Ultrasound reveals strain pattern, no definite tear.  Will treat with voltaren gel, PT.  Activity restriction until no longer limping.  Anticipate 6-8 weeks healing time.  Reassess if fails to improve.

## 2019-10-22 NOTE — Progress Notes (Signed)
Office Visit Note   Patient: Tina Lucas           Date of Birth: 09-05-1950           MRN: [id number] Visit Date: 10/22/2019 Requested by: Binnie Rail, MD [place],  [place] [address] PCP: Binnie Rail, MD  Subjective: Chief Complaint  Patient presents with  . pain rt gluteal area since 9/17, pickleball injury    HPI: 69yo female presenting to clinic with concerns of Rt gluteal pain xnearly 2 weeks. Patient states that on 17Sept she was playing pickleball, and during her warm up she felt a sudden pain in her right gluteal area. She says she tried to play through it, but her discomfort was becoming too significant. She is typically extremely active, with nearly an hour of walking and weightlifting daily- however this pain is causing her to limp. She hasn't been able to tolerate playing pickleball (which she endorses as her favorite hobby) since her injury.  She states that she feels as though there is a 'grapefruit' stuck in the gluteal region causes intense pain. She has tried speaking with her physical therapist, who gave her some initial exercises, though she hasn't noticed much success with these yet. She endorses compliance with the HEP at least twice daily.  Pain does not radiate down her leg, she denies numbness/tingling.               ROS:   All other systems were reviewed and are negative.  Objective: Vital Signs: There were no vitals taken for this visit.  Physical Exam:  General:  Alert and oriented, in no acute distress. Pulm:  Breathing unlabored. Psy:  Normal mood, congruent affect. Skin:  No bruises or rashes in gluteal region.  Normal gait.  Right gluteal region with no palpable deformity or masses.  Significant tenderness to palpation throughout R Glute med area, as well as R piriformis. Tenderness also extends into R TFL Area.   Imaging: US Guided Needle Placement - No Linked Charges  Result Date: 10/22/2019 Limited diagnostic ultrasound  reveals a strain pattern in the piriformis and gluteus muscle.  No major tear seen.  Tendon insertions on greater trochanter have hypoechoic swelling, but do not have significant tearing.    Assessment & Plan: 69yo F presenting to clinic today with concerns of sudden Rt gluteal pain while playing pickleball. [name] was performed, which did not demonstrate significant tear or hematomas, which is very reassuring.  -Discussed that she should continue her work with physical therapy, and can anticipate at least a month of recovery before she will be comfortable returning to pickleball. -Recommended stationary bike for cardiac exercise in lieu of walking until her acute pain has improved to the point that she is no longer limping. -RTC if no improvement with physical therapy.      Procedures: No procedures performed  No notes on file     PMFS History: Patient Active Problem List   Diagnosis Date Noted  . Pain in right buttock 10/21/2019  . Belching   . Upper back pain on left side 07/15/2019  . Hiatal hernia 06/15/2019  . Functional heartburn 06/15/2019  . Hypersensitivity 06/15/2019  . Chest wall discomfort 07/22/2018  . Pyrosis 04/26/2018  . Positive colorectal cancer screening using Cologuard test 04/26/2018  . Gastric ulcer without hemorrhage or perforation 04/26/2018  . Abnormal findings on esophagogastroduodenoscopy (EGD) 04/26/2018  . Atypical chest pain 02/26/2018  . Asthma-COPD overlap syndrome ([place]) 01/31/2018  .  Chronic neck pain 08/27/2017  . GERD (gastroesophageal reflux disease) 04/20/2016  . Numbness 08/06/2015  . Osteopenia 12/10/2014  . Allergic urticaria 12/10/2014  . Pressure in head 04/01/2014  . Vertigo 02/10/2014  . Elevated blood pressure reading 11/06/2013  . Hypothyroid   . Primary HSV infection of mouth    Past Medical History:  Diagnosis Date  . Allergic rhinitis   . Allergy   . Asthma    as a child  . 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 COPD (chronic obstructive pulmonary disease) ([place])   . History of asthma 1956-2000  . Hypothyroid 2013  . Osteopenia   . Primary HSV infection of mouth 2008  . Pulmonary nodule   . Sunlight-induced angio-edema-urticaria 07/11/2011   unknown trigger, neg autoimmune workup    Family History  Problem Relation Age of Onset  . Breast cancer Mother 94  . Osteoarthritis Father   . Hypertension Father   . Leukemia Maternal Aunt 68  . Atrial fibrillation Brother   . Colon cancer Neg Hx   . Esophageal cancer Neg Hx   . Inflammatory bowel disease Neg Hx   . Liver disease Neg Hx   . Pancreatic cancer Neg Hx   . Rectal cancer Neg Hx   . Stomach cancer Neg Hx     Past Surgical History:  Procedure Laterality Date  . [place] STUDY N/A 08/13/2019   Procedure: 24 HOUR PH STUDY- impedence;  Surgeon: Irving Copas., MD;  Location: [facility];  Service: Gastroenterology;  Laterality: N/A;  . COLONOSCOPY    . ESOPHAGEAL MANOMETRY N/A 08/13/2019   Procedure: ESOPHAGEAL MANOMETRY (EM);  Surgeon: Irving Copas., MD;  Location: [facility];  Service: Gastroenterology;  Laterality: N/A;  . [place] IMPEDANCE STUDY  08/13/2019   Procedure: [place] IMPEDANCE STUDY;  Surgeon: Rush Landmark Telford Nab., MD;  Location: [facility];  Service: Gastroenterology;;  . polyp on vocal chord     . TONSILLECTOMY  1958   Social History   Occupational History  . Not on file  Tobacco Use  . Smoking status: Never Smoker  . Smokeless tobacco: Never Used  Vaping Use  . Vaping Use: Never used  Substance and Sexual Activity  . Alcohol use: Not Currently    Alcohol/week: 0.0 standard drinks  . Drug use: No  . Sexual activity: Not on file

## 2019-10-27 ENCOUNTER — Encounter: Payer: Self-pay | Admitting: Physical Therapy

## 2019-10-27 ENCOUNTER — Other Ambulatory Visit: Payer: Self-pay

## 2019-10-27 ENCOUNTER — Ambulatory Visit: Payer: Medicare PPO | Attending: Internal Medicine | Admitting: Physical Therapy

## 2019-10-27 DIAGNOSIS — M542 Cervicalgia: Secondary | ICD-10-CM

## 2019-10-27 DIAGNOSIS — M25551 Pain in right hip: Secondary | ICD-10-CM | POA: Insufficient documentation

## 2019-10-27 DIAGNOSIS — M62838 Other muscle spasm: Secondary | ICD-10-CM | POA: Diagnosis not present

## 2019-10-27 DIAGNOSIS — R262 Difficulty in walking, not elsewhere classified: Secondary | ICD-10-CM | POA: Diagnosis not present

## 2019-10-27 NOTE — Therapy (Signed)
[facility] [address] [place], [place], [address] Phone: [phone]   Fax:  [phone]  Physical Therapy Treatment  Patient Details  Name: Tina Lucas MRN: [id number] Date of Birth: Jun 26, 1950 Referring Provider (PT): Binnie Rail, MD   Encounter Date: 10/27/2019   PT End of Session - 10/27/19 1756    Visit Number 11    Date for PT Re-Evaluation 12/09/19    PT Start Time 1233    PT Stop Time 1315    PT Time Calculation (min) 42 min    Activity Tolerance Patient tolerated treatment well    Behavior During Therapy [facility] for tasks assessed/performed;Agitated           Past Medical History:  Diagnosis Date  . Allergic rhinitis   . Allergy   . Asthma    as a child  . 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 COPD (chronic obstructive pulmonary disease) ([place])   . History of asthma 1956-2000  . Hypothyroid 2013  . Osteopenia   . Primary HSV infection of mouth 2008  . Pulmonary nodule   . 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 [place] STUDY N/A 08/13/2019   Procedure: 24 HOUR PH STUDY- impedence;  Surgeon: Irving Copas., MD;  [facility];  Service: Gastroenterology;  Laterality: N/A;  . COLONOSCOPY    . ESOPHAGEAL MANOMETRY N/A 08/13/2019   Procedure: ESOPHAGEAL MANOMETRY (EM);  Surgeon: Irving Copas., MD;  [facility];  Service: Gastroenterology;  Laterality: N/A;  . [place] IMPEDANCE STUDY  08/13/2019   Procedure: [place] IMPEDANCE STUDY;  Surgeon: Rush Landmark Telford Nab., MD;  [facility];  Service: Gastroenterology;;  . polyp on vocal chord     . TONSILLECTOMY  1958    There were no vitals filed for this visit.   Subjective Assessment - 10/27/19 1244    Subjective Pt states her hip has gotten worse and she got a  referral for her hip, but she is wanting to finish treatment for current issue and will then evaluate the hip.  States her balance and headache/dizziness is worse now because of the gait    Currently in Pain? Yes    Pain Score 8     Pain Location Neck    Pain Descriptors / Indicators Headache   dizziness   Multiple Pain Sites No                             [facility] Adult PT Treatment/Exercise - 10/27/19 0001      Exercises   Other Exercises  clam and walking with good gait mechanics for short distances to build up walking distance      Pilates   Other Pilates hiP IR and ER      Manual Therapy   Myofascial Release UT, suboccipitals and cervical paraspinals                    PT Short Term Goals - 09/08/19 1238      PT SHORT TERM GOAL #1   Title pt will be able to make it through 2 weeks of normal activity with at most one episode of pain in the upper back    Baseline only one day of severe pain much better this week; only one  walk but normal gym time    Status Partially Met             PT Long Term Goals - 10/07/19 1200      PT LONG TERM GOAL #1   Title pt will be ind with advanced HEP    Time 8    Period Weeks    Status On-going    Target Date 12/09/19      PT LONG TERM GOAL #2   Title Pt will report 50% less pain with working out at the gym    Baseline no pain currently    Time 8    Period Weeks    Status Achieved    Target Date 12/09/19      PT LONG TERM GOAL #3   Title FOTO < or = to 38% limited    Baseline 39%    Time 8    Period Weeks    Status Partially Met    Target Date 12/09/19      PT LONG TERM GOAL #4   Title pt demonstrates improved cervical rotation to 60 deg bilaterally for improved ability to navigate traffic while driving    Baseline 55 deg right; 65 left    Time 8    Period Weeks    Status On-going    Target Date 12/09/19      PT LONG TERM GOAL #5   Title Pt will report 50% less muscle spasms in her neck so  she can tolerate going for walks at least 3x/week for 45 minutes or more without pain    Baseline walking an hour without pain 2/wee    Time 8    Period Weeks    Status Partially Met    Target Date 12/09/19                 Plan - 10/27/19 1757    Clinical Impression Statement Today's session focused on patient concerns with gait which is adding to her neck stiffness and dizziness.  Pt has gluteal injury that she will return to evaluate in several visits but she wants to make sure the neck and upper back is resolved.  Was tight and TTP in upper traps, subocciptals and responded well to STM.  Pt was also given progression on walking more and hip stretches to improve gait.    PT Treatment/Interventions ADLs/Self Care Home Management;Biofeedback;Cryotherapy;Electrical Stimulation;Moist Heat;Therapeutic activities;Therapeutic exercise;Neuromuscular re-education;Patient/family education;Manual techniques;Passive range of motion;Taping;Dry needling    PT Home Exercise Plan Access Code: [email or web address]    Consulted and Agree with Plan of Care Patient           Patient will benefit from skilled therapeutic intervention in order to improve the following deficits and impairments:  Increased fascial restricitons, Decreased range of motion, Decreased strength, Postural dysfunction, Pain, Difficulty walking  Visit Diagnosis: Other muscle spasm  Cervicalgia     Problem List Patient Active Problem List   Diagnosis Date Noted  . Pain in right buttock 10/21/2019  . Belching   . Upper back pain on left side 07/15/2019  . Hiatal hernia 06/15/2019  . Functional heartburn 06/15/2019  . Hypersensitivity 06/15/2019  . Chest wall discomfort 07/22/2018  . Pyrosis 04/26/2018  . Positive colorectal cancer screening using Cologuard test 04/26/2018  . Gastric ulcer without hemorrhage or perforation 04/26/2018  . Abnormal findings on esophagogastroduodenoscopy (EGD) 04/26/2018  . Atypical chest pain  02/26/2018  . Asthma-COPD overlap syndrome ([place]) 01/31/2018  . Chronic neck pain 08/27/2017  .  GERD (gastroesophageal reflux disease) 04/20/2016  . Numbness 08/06/2015  . Osteopenia 12/10/2014  . Allergic urticaria 12/10/2014  . Pressure in head 04/01/2014  . Vertigo 02/10/2014  . Elevated blood pressure reading 11/06/2013  . Hypothyroid   . Primary HSV infection of mouth     Jule Ser, PT 10/27/2019, 6:00 PM  [facility] [address], [place], [place], [address] Phone: [phone]   Fax:  [phone]  Name: Tina Lucas MRN: [id number] Date of Birth: Mar 16, 1950

## 2019-11-03 ENCOUNTER — Other Ambulatory Visit: Payer: Self-pay

## 2019-11-03 ENCOUNTER — Ambulatory Visit: Payer: Medicare PPO | Attending: Internal Medicine | Admitting: Physical Therapy

## 2019-11-03 DIAGNOSIS — M542 Cervicalgia: Secondary | ICD-10-CM | POA: Diagnosis not present

## 2019-11-03 DIAGNOSIS — M62838 Other muscle spasm: Secondary | ICD-10-CM | POA: Insufficient documentation

## 2019-11-03 NOTE — Therapy (Signed)
[facility] [address], [place], [place], [address] Phone: [phone]   Fax:  [phone]  Physical Therapy Treatment  Patient Details  Name: Tina Lucas MRN: [id number] Date of Birth: December 09, 1950 Referring Provider (PT): Binnie Rail, MD   Encounter Date: 11/03/2019   PT End of Session - 11/03/19 1617    Visit Number 12    Date for PT Re-Evaluation 12/09/19    PT Start Time 1237    PT Stop Time 1318    PT Time Calculation (min) 41 min    Activity Tolerance Patient tolerated treatment well    Behavior During Therapy [facility] for tasks assessed/performed           Past Medical History:  Diagnosis Date  . Allergic rhinitis   . Allergy   . Asthma    as a child  . 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 COPD (chronic obstructive pulmonary disease) ([place])   . History of asthma 1956-2000  . Hypothyroid 2013  . Osteopenia   . Primary HSV infection of mouth 2008  . Pulmonary nodule   . 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 [place] STUDY N/A 08/13/2019   Procedure: 24 HOUR PH STUDY- impedence;  Surgeon: Irving Copas., MD;  [facility];  Service: Gastroenterology;  Laterality: N/A;  . COLONOSCOPY    . ESOPHAGEAL MANOMETRY N/A 08/13/2019   Procedure: ESOPHAGEAL MANOMETRY (EM);  Surgeon: Irving Copas., MD;  [facility];  Service: Gastroenterology;  Laterality: N/A;  . [place] IMPEDANCE STUDY  08/13/2019   Procedure: [place] IMPEDANCE STUDY;  Surgeon: Rush Landmark Telford Nab., MD;  [facility];  Service: Gastroenterology;;  . polyp on vocal chord     . TONSILLECTOMY  1958    There were no vitals filed for this visit.   Subjective Assessment - 11/03/19 1241    Subjective Pt walked 32 minutes and the TFL is huritng and hip hurt  ever since. Pt states she was walking slower and the hip hurt all that day.  Pt states having some rebound from going off the reflux medicine.    Pertinent History osteoporosis, COPD, esophogeal spasms (can cause chest pains)    Patient Stated Goals reduce shoulder pain so she can walk, work out and maybe play pickle ball    Currently in Pain? Yes    Pain Score 7     Pain Location Hip    Pain Orientation Right    Pain Radiating Towards into right medial scapular region    Pain Onset More than a month ago    Pain Frequency Intermittent    Multiple Pain Sites No                             [facility] Adult PT Treatment/Exercise - 11/03/19 0001      Self-Care   Other Self-Care Comments  return to walking program; discussed 10 min and adding 10 min intervals into the day up to 3x as long as hip not hurting      Manual Therapy   Soft tissue mobilization TFL and glute med on the Rt side    Myofascial Release UT, suboccipitals and cervical paraspinals; thoracic paraspinals and rhomboids on the Rt side  PT Short Term Goals - 09/08/19 1238      PT SHORT TERM GOAL #1   Title pt will be able to make it through 2 weeks of normal activity with at most one episode of pain in the upper back    Baseline only one day of severe pain much better this week; only one walk but normal gym time    Status Partially Met             PT Long Term Goals - 10/07/19 1200      PT LONG TERM GOAL #1   Title pt will be ind with advanced HEP    Time 8    Period Weeks    Status On-going    Target Date 12/09/19      PT LONG TERM GOAL #2   Title Pt will report 50% less pain with working out at the gym    Baseline no pain currently    Time 8    Period Weeks    Status Achieved    Target Date 12/09/19      PT LONG TERM GOAL #3   Title FOTO < or = to 38% limited    Baseline 39%    Time 8    Period Weeks    Status Partially Met    Target Date 12/09/19       PT LONG TERM GOAL #4   Title pt demonstrates improved cervical rotation to 60 deg bilaterally for improved ability to navigate traffic while driving    Baseline 55 deg right; 65 left    Time 8    Period Weeks    Status On-going    Target Date 12/09/19      PT LONG TERM GOAL #5   Title Pt will report 50% less muscle spasms in her neck so she can tolerate going for walks at least 3x/week for 45 minutes or more without pain    Baseline walking an hour without pain 2/wee    Time 8    Period Weeks    Status Partially Met    Target Date 12/09/19                 Plan - 11/03/19 1614    Clinical Impression Statement Pt did well with dizziness reduced after previous session.  She has been able to get back to some walking but 30 minute walk exacerbated pain. Pt educated on more gradual return to activities and STM and MFR to work on soft tissue mmobility and posture improvement.STM/MFR as needed to get patient back to pickel ball routine; continue thoracic shoulder girdle, core and pelvic girdle strength as tolerated; pelvic mobility for reduced stresses being transferred up the kinetic chain    PT Next Visit Plan STM/MFR as needed to get patient back to pickel ball routine; continue thoracic shoulder girdle, core and pelvic girdle strength as tolerated; pelvic mobility for reduced stresses being transferred up the kinetic chain    PT Home Exercise Plan Access Code: [email or web address]    Consulted and Agree with Plan of Care Patient           Patient will benefit from skilled therapeutic intervention in order to improve the following deficits and impairments:  Increased fascial restricitons, Decreased range of motion, Decreased strength, Postural dysfunction, Pain, Difficulty walking  Visit Diagnosis: Cervicalgia  Other muscle spasm     Problem List Patient Active Problem List   Diagnosis Date Noted  . Pain in right buttock   10/21/2019  . Belching   . Upper back pain on left side  07/15/2019  . Hiatal hernia 06/15/2019  . Functional heartburn 06/15/2019  . Hypersensitivity 06/15/2019  . Chest wall discomfort 07/22/2018  . Pyrosis 04/26/2018  . Positive colorectal cancer screening using Cologuard test 04/26/2018  . Gastric ulcer without hemorrhage or perforation 04/26/2018  . Abnormal findings on esophagogastroduodenoscopy (EGD) 04/26/2018  . Atypical chest pain 02/26/2018  . Asthma-COPD overlap syndrome ([facility]) 01/31/2018  . Chronic neck pain 08/27/2017  . GERD (gastroesophageal reflux disease) 04/20/2016  . Numbness 08/06/2015  . Osteopenia 12/10/2014  . Allergic urticaria 12/10/2014  . Pressure in head 04/01/2014  . Vertigo 02/10/2014  . Elevated blood pressure reading 11/06/2013  . Hypothyroid   . Primary HSV infection of mouth     Tina Lucas, PT 11/03/2019, 4:19 PM  [facility] [address], [address] [place], [place], [address] Phone: [phone]   Fax:  [phone]  Name: Tina Lucas MRN: [id number] Date of Birth: 04/04/1950   

## 2019-11-10 ENCOUNTER — Ambulatory Visit: Payer: Medicare PPO | Admitting: Physical Therapy

## 2019-11-10 ENCOUNTER — Encounter: Payer: Self-pay | Admitting: Physical Therapy

## 2019-11-10 ENCOUNTER — Other Ambulatory Visit: Payer: Self-pay

## 2019-11-10 DIAGNOSIS — M62838 Other muscle spasm: Secondary | ICD-10-CM | POA: Diagnosis not present

## 2019-11-10 DIAGNOSIS — M542 Cervicalgia: Secondary | ICD-10-CM | POA: Diagnosis not present

## 2019-11-10 NOTE — Patient Instructions (Signed)
Access Code: [email or web address] URL: [email or web address] Date: 11/10/2019 Prepared by: Jari Favre  Exercises Standing Lumbar Extension at [place] - 2 x daily - 7 x weekly - 1 sets - 5 reps - 20 hold TL Sidebending Stretch - Arms Overhead - 1 x daily - 7 x weekly - 3 sets - 10 reps Seated Shoulder Cross Body Stretch - 1 x daily - 7 x weekly - 3 sets - 10 reps Standing Shoulder Abduction Slides at Wall - 1 x daily - 7 x weekly - 1 sets - 10 reps Seated Hip Abduction with Resistance - 1 x daily - 7 x weekly - 3 sets - 10 reps Seated Hamstring Stretch - 1 x daily - 7 x weekly - 3 reps - 1 sets - 30 sec hold Seated Figure 4 Piriformis Stretch - 1 x daily - 7 x weekly - 3 reps - 1 sets - 30 hold Hip Extension with Single Leg Support Prone on Table Edge - 1 x daily - 7 x weekly - 3 sets - 10 reps Sit to Stand with Resistance Around Legs - 1 x daily - 7 x weekly - 3 sets - 10 reps Full Leg Press with Resistance Around Knees - 1 x daily - 7 x weekly - 3 sets - 10 reps

## 2019-11-10 NOTE — Therapy (Signed)
[facility] [address] [place], [place], [address] Phone: [phone]   Fax:  [phone]  Physical Therapy Treatment  Patient Details  Name: Tina Lucas MRN: [id number] Date of Birth: [birth year] Referring Provider (PT): Binnie Rail, MD   Encounter Date: 11/10/2019   PT End of Session - 11/10/19 1300    Visit Number 13    Date for PT Re-Evaluation 12/09/19    PT Start Time 1232    PT Stop Time 1310    PT Time Calculation (min) 38 min    Activity Tolerance Patient tolerated treatment well    Behavior During Therapy [facility] for tasks assessed/performed           Past Medical History:  Diagnosis Date  . Allergic rhinitis   . Allergy   . Asthma    as a child  . 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 COPD (chronic obstructive pulmonary disease) ([place])   . History of asthma 1956-2000  . Hypothyroid 2013  . Osteopenia   . Primary HSV infection of mouth 2008  . Pulmonary nodule   . 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 [place] STUDY N/A 08/13/2019   Procedure: 24 HOUR PH STUDY- impedence;  Surgeon: Irving Copas., MD;  [facility];  Service: Gastroenterology;  Laterality: N/A;  . COLONOSCOPY    . ESOPHAGEAL MANOMETRY N/A 08/13/2019   Procedure: ESOPHAGEAL MANOMETRY (EM);  Surgeon: Irving Copas., MD;  [facility];  Service: Gastroenterology;  Laterality: N/A;  . [place] IMPEDANCE STUDY  08/13/2019   Procedure: [place] IMPEDANCE STUDY;  Surgeon: Rush Landmark Telford Nab., MD;  [facility];  Service: Gastroenterology;;  . polyp on vocal chord     . TONSILLECTOMY  1958    There were no vitals filed for this visit.   Subjective Assessment - 11/10/19 1238    Subjective Pt states she didn't have much pain this week.  She states  she didn't hurt with walking when breaking it up into smaller walks.    Pertinent History osteoporosis, COPD, esophogeal spasms (can cause chest pains)    Patient Stated Goals reduce shoulder pain so she can walk, work out and maybe play pickle ball    Currently in Pain? Yes    Pain Score 3     Pain Location Hip    Pain Orientation Right    Pain Descriptors / Indicators Aching    Pain Type Acute pain    Pain Onset More than a month ago    Multiple Pain Sites No                             [facility] Adult PT Treatment/Exercise - 11/10/19 0001      Self-Care   Other Self-Care Comments  reviewed weekly exercises and discussed doing some easy hitting and no running or lateral movements yet      Lumbar Exercises: Stretches   Active Hamstring Stretch Right;Left;1 rep;30 seconds      Lumbar Exercises: Machines for Strengthening   Leg Press 1plate +15 - bilat 59Y      Lumbar Exercises: Seated   Sit to Stand 15 reps    Sit to Stand Limitations blue loop around knees; cues for knee alignment      Manual Therapy  Soft tissue mobilization TFL and gluteals Rt side                  PT Education - 11/10/19 1309    Education Details Access Code: [email or web address]    Person(s) Educated Patient    Methods Explanation;Demonstration;Verbal cues;Handout    Comprehension Verbalized understanding;Returned demonstration            PT Short Term Goals - 09/08/19 1238      PT SHORT TERM GOAL #1   Title pt will be able to make it through 2 weeks of normal activity with at most one episode of pain in the upper back    Baseline only one day of severe pain much better this week; only one walk but normal gym time    Status Partially Met             PT Long Term Goals - 10/07/19 1200      PT LONG TERM GOAL #1   Title pt will be ind with advanced HEP    Time 8    Period Weeks    Status On-going    Target Date 12/09/19      PT LONG TERM GOAL #2   Title Pt will report 50%  less pain with working out at the gym    Baseline no pain currently    Time 8    Period Weeks    Status Achieved    Target Date 12/09/19      PT LONG TERM GOAL #3   Title FOTO < or = to 38% limited    Baseline 39%    Time 8    Period Weeks    Status Partially Met    Target Date 12/09/19      PT LONG TERM GOAL #4   Title pt demonstrates improved cervical rotation to 60 deg bilaterally for improved ability to navigate traffic while driving    Baseline 55 deg right; 65 left    Time 8    Period Weeks    Status On-going    Target Date 12/09/19      PT LONG TERM GOAL #5   Title Pt will report 50% less muscle spasms in her neck so she can tolerate going for walks at least 3x/week for 45 minutes or more without pain    Baseline walking an hour without pain 2/wee    Time 8    Period Weeks    Status Partially Met    Target Date 12/09/19                 Plan - 11/10/19 1406    Clinical Impression Statement Pt did really well with adding some exercises.  She was able to get more gluteal activation with leg press and sit to stand with band around knees.  Pt demonstrates good posture and core stabiltiy which is necessary for her to return to pickle ball without the increased neck, thoracic and head pain.  Pt had much less issues with the neck and dizziness this week.  Continue with POC with incorporating LE movements for overall core strengthening    PT Treatment/Interventions ADLs/Self Care Home Management;Biofeedback;Cryotherapy;Electrical Stimulation;Moist Heat;Therapeutic activities;Therapeutic exercise;Neuromuscular re-education;Patient/family education;Manual techniques;Passive range of motion;Taping;Dry needling    PT Next Visit Plan f/u on leg press and hip strengthen; add side steps with band; STM/MFR as needed to get patient back to pickel ball routine; continue thoracic shoulder girdle, core and pelvic girdle strength as tolerated; pelvic mobility  for reduced stresses being  transferred up the kinetic chain    PT Home Exercise Plan Access Code: [email or web address]    Consulted and Agree with Plan of Care Patient           Patient will benefit from skilled therapeutic intervention in order to improve the following deficits and impairments:  Increased fascial restricitons, Decreased range of motion, Decreased strength, Postural dysfunction, Pain, Difficulty walking  Visit Diagnosis: Cervicalgia  Other muscle spasm     Problem List Patient Active Problem List   Diagnosis Date Noted  . Pain in right buttock 10/21/2019  . Belching   . Upper back pain on left side 07/15/2019  . Hiatal hernia 06/15/2019  . Functional heartburn 06/15/2019  . Hypersensitivity 06/15/2019  . Chest wall discomfort 07/22/2018  . Pyrosis 04/26/2018  . Positive colorectal cancer screening using Cologuard test 04/26/2018  . Gastric ulcer without hemorrhage or perforation 04/26/2018  . Abnormal findings on esophagogastroduodenoscopy (EGD) 04/26/2018  . Atypical chest pain 02/26/2018  . Asthma-COPD overlap syndrome ([place]) 01/31/2018  . Chronic neck pain 08/27/2017  . GERD (gastroesophageal reflux disease) 04/20/2016  . Numbness 08/06/2015  . Osteopenia 12/10/2014  . Allergic urticaria 12/10/2014  . Pressure in head 04/01/2014  . Vertigo 02/10/2014  . Elevated blood pressure reading 11/06/2013  . Hypothyroid   . Primary HSV infection of mouth     Jule Ser, PT 11/10/2019, 2:16 PM  [facility] [address] [place], [place], [address] Phone: [phone]   Fax:  [phone]  Name: Candia Kingsbury MRN: [id number] Date of Birth: 12/15/1950

## 2019-11-17 ENCOUNTER — Other Ambulatory Visit: Payer: Self-pay

## 2019-11-17 ENCOUNTER — Encounter: Payer: Self-pay | Admitting: Physical Therapy

## 2019-11-17 ENCOUNTER — Ambulatory Visit: Payer: Medicare PPO | Admitting: Physical Therapy

## 2019-11-17 DIAGNOSIS — R262 Difficulty in walking, not elsewhere classified: Secondary | ICD-10-CM | POA: Diagnosis not present

## 2019-11-17 DIAGNOSIS — M542 Cervicalgia: Secondary | ICD-10-CM

## 2019-11-17 DIAGNOSIS — M62838 Other muscle spasm: Secondary | ICD-10-CM

## 2019-11-17 DIAGNOSIS — M25551 Pain in right hip: Secondary | ICD-10-CM

## 2019-11-17 NOTE — Therapy (Signed)
[facility] [address], [place], [place], [address] Phone: [phone]   Fax:  [phone]  Physical Therapy Evaluation  Patient Details  Name: Tina Lucas MRN: [id number] Date of Birth: 29-Jun-1950 Referring Provider (PT): Eunice Blase, MD   Encounter Date: 11/17/2019   PT End of Session - 11/17/19 1244    Visit Number 1    Date for PT Re-Evaluation 02/09/20    PT Start Time 8546    PT Stop Time 1315    PT Time Calculation (min) 44 min    Activity Tolerance Patient tolerated treatment well    Behavior During Therapy [facility] for tasks assessed/performed           Past Medical History:  Diagnosis Date  . Allergic rhinitis   . Allergy   . Asthma    as a child  . 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 COPD (chronic obstructive pulmonary disease) ([place])   . History of asthma 1956-2000  . Hypothyroid 2013  . Osteopenia   . Primary HSV infection of mouth 2008  . Pulmonary nodule   . 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 [place] STUDY N/A 08/13/2019   Procedure: 24 HOUR PH STUDY- impedence;  Surgeon: Irving Copas., MD;  [facility];  Service: Gastroenterology;  Laterality: N/A;  . COLONOSCOPY    . ESOPHAGEAL MANOMETRY N/A 08/13/2019   Procedure: ESOPHAGEAL MANOMETRY (EM);  Surgeon: Irving Copas., MD;  [facility];  Service: Gastroenterology;  Laterality: N/A;  . [place] IMPEDANCE STUDY  08/13/2019   Procedure: [place] IMPEDANCE STUDY;  Surgeon: Rush Landmark Telford Nab., MD;  [facility];  Service: Gastroenterology;;  . polyp on vocal chord     . TONSILLECTOMY  1958    There were no vitals filed for this visit.    Subjective Assessment - 11/17/19 1539    Subjective Pt states 2/10 hip pain currently but feels limited with  the pain across shoulders.  States it feels tight because I cannot play pickle ball and gait is disturbed.  Pt states hip pain moves from side to back.    Pertinent History osteoporosis, COPD, esophogeal spasms (can cause chest pains), vertigo positional - cannot lie down    Diagnostic tests [name] image shows inflammation in Rt gluteals and around greater trochanter on Rt    Patient Stated Goals reduce neck, shoulder, back, and hip pain so can walk and play pickle ball    Currently in Pain? Yes    Pain Score 2     Pain Location Hip    Pain Orientation Right;Lateral;Posterior    Pain Descriptors / Indicators Aching    Pain Type Acute pain    Pain Radiating Towards back to side    Pain Onset More than a month ago    Pain Frequency Intermittent    Aggravating Factors  walking and standing    Effect of Pain on Daily Activities walking, pickle ball    Multiple Pain Sites No              [facility] PT Assessment - 11/17/19 0001      Assessment   Medical Diagnosis M79.18 (ICD-10-CM) - Right buttock pain    Referring Provider (PT) Eunice Blase, MD    Onset Date/Surgical Date 10/20/19    Prior Therapy No  Precautions   Precautions None      Restrictions   Weight Bearing Restrictions No      Balance Screen   Has the patient fallen in the past 6 months No      [place] residence    Living Arrangements Spouse/significant other      Prior Function   Level of [place] Retired      Associate Professor   Overall Cognitive Status Within Functional Limits for tasks assessed      Observation/Other Assessments   Focus on Therapeutic Outcomes (FOTO)  56% limited      Functional Tests   Functional tests Squat;Sit to Stand;Single leg stance      Squat   Comments weight shift Left      Single Leg Stance   Comments Trendelenburg with SLS Rt LE      Sit to Stand   Comments weight shift Lt and uses momentum of arm swing to stand       Posture/Postural Control   Posture/Postural Control Postural limitations    Postural Limitations Rounded Shoulders;Forward head;Increased thoracic kyphosis   scoliosis     Strength   Overall Strength Comments Rt hip flex, abd, ext 4/5 MMT; Lt hip 5/5      Palpation   Palpation comment Rt gluteus medius and TFL TTP; TTP around Rt greater trochanter; thoracic hypomobility throughout very TTP T1-3; cervical paraspinals tight and tender and feel taught bands throughout      Ambulation/Gait   Gait Pattern Lateral trunk lean to right                      Objective measurements completed on examination: See above findings.       [place] Adult PT Treatment/Exercise - 11/17/19 0001      Lumbar Exercises: Machines for Strengthening   Leg Press 30 lb - bilat 30x      Manual Therapy   Myofascial Release along thoracic Rt>Lt                    PT Short Term Goals - 11/17/19 1523      PT SHORT TERM GOAL #1   Title Pt will be able to walk 25 min x 3 days/week without increased pain    Baseline 17-21 min with increased pain    Time 4    Period Weeks    Status New    Target Date 12/15/19      PT SHORT TERM GOAL #2   Title Pt will be ind with initial HEP    Time 4    Period Weeks    Status New    Target Date 12/15/19             PT Long Term Goals - 11/17/19 1524      PT LONG TERM GOAL #1   Title pt will be ind with advanced HEP    Time 12    Period Weeks    Status New    Target Date 02/09/20      PT LONG TERM GOAL #2   Title Pt will be able to walk 45 minutes 3x/week without increased pain in the hip    Time 12    Period Weeks    Status New    Target Date 02/09/20      PT LONG TERM GOAL #3   Title FOTO < or = to 37%  limited    Baseline 56%    Time 12    Period Weeks    Status New    Target Date 02/09/20      PT LONG TERM GOAL #4   Title pt demonstrates ability to perform squat with weight evenly placed on BLE    Time 12     Period Weeks    Status New    Target Date 02/09/20      PT LONG TERM GOAL #5   Title Pt will have hip flexion, extension, and abduction improved to 5/5 so she can begin to work on return to [employer].    Time 12    Period Weeks    Status New    Target Date 02/09/20                  Plan - 11/17/19 1509    Clinical Impression Statement Pt presents to clinic today due to gluteal muscle strain that happened last month after playing pickle ball.  Pt has recently been seen at our clinic due to upper back and neck pain that has started recently since the onset of COVID and being unable to do her normal activities.  Pt thrives when playing pickle ball and is very competitive and due to neck, back, and now the gluteal strain, she is unable to participate.  Pt demonstrates hip weakness, posture deficits, and deficits of functinoal movements as mentioned above.  Pt will benefit from skilled PT to address impairments and improve functional activities so she can return to activities as part of her healthy lifestyle.    Personal Factors and Comorbidities Comorbidity 3+    Comorbidities osteoporosis, COPD, esophogeal spasms (can cause chest pains), vertigo (unable to lie supine or sidelying)    Examination-Participation Restrictions Community Activity    Stability/Clinical Decision Making Evolving/Moderate complexity    Clinical Decision Making Moderate    Rehab Potential Good    PT Frequency 2x / week   2x if able   PT Duration 12 weeks    PT Treatment/Interventions ADLs/Self Care Home Management;Biofeedback;Cryotherapy;Electrical Stimulation;Moist Heat;Therapeutic activities;Therapeutic exercise;Neuromuscular re-education;Patient/family education;Manual techniques;Passive range of motion;Taping;Dry needling;Iontophoresis 4mg /ml Dexamethasone;Ultrasound;Gait training    PT Next Visit Plan single leg standing, posture, glute strength in standing    PT Home Exercise Plan Access Code: [email or web address]     Consulted and Agree with Plan of Care Patient           Patient will benefit from skilled therapeutic intervention in order to improve the following deficits and impairments:  Increased fascial restricitons, Decreased range of motion, Decreased strength, Postural dysfunction, Pain, Difficulty walking, Abnormal gait, Increased muscle spasms  Visit Diagnosis: Pain in right hip  Difficulty in walking, not elsewhere classified  Other muscle spasm  Cervicalgia     Problem List Patient Active Problem List   Diagnosis Date Noted  . Pain in right buttock 10/21/2019  . Belching   . Upper back pain on left side 07/15/2019  . Hiatal hernia 06/15/2019  . Functional heartburn 06/15/2019  . Hypersensitivity 06/15/2019  . Chest wall discomfort 07/22/2018  . Pyrosis 04/26/2018  . Positive colorectal cancer screening using Cologuard test 04/26/2018  . Gastric ulcer without hemorrhage or perforation 04/26/2018  . Abnormal findings on esophagogastroduodenoscopy (EGD) 04/26/2018  . Atypical chest pain 02/26/2018  . Asthma-COPD overlap syndrome ([place]) 01/31/2018  . Chronic neck pain 08/27/2017  . GERD (gastroesophageal reflux disease) 04/20/2016  . Numbness 08/06/2015  . Osteopenia 12/10/2014  . Allergic urticaria  12/10/2014  . Pressure in head 04/01/2014  . Vertigo 02/10/2014  . Elevated blood pressure reading 11/06/2013  . Hypothyroid   . Primary HSV infection of mouth     Jule Ser, PT 11/17/2019, 3:46 PM  [facility] [address] [place], [place], [address] Phone: [phone]   Fax:  [phone]  Name: Tina Lucas MRN: [id number] Date of Birth: 1950/01/30

## 2019-11-19 ENCOUNTER — Ambulatory Visit: Payer: Medicare PPO | Admitting: Pulmonary Disease

## 2019-11-24 ENCOUNTER — Other Ambulatory Visit: Payer: Self-pay

## 2019-11-24 ENCOUNTER — Ambulatory Visit: Payer: Medicare PPO | Attending: Family Medicine | Admitting: Physical Therapy

## 2019-11-24 DIAGNOSIS — M62838 Other muscle spasm: Secondary | ICD-10-CM | POA: Diagnosis not present

## 2019-11-24 DIAGNOSIS — M542 Cervicalgia: Secondary | ICD-10-CM | POA: Insufficient documentation

## 2019-11-24 DIAGNOSIS — R262 Difficulty in walking, not elsewhere classified: Secondary | ICD-10-CM | POA: Insufficient documentation

## 2019-11-24 DIAGNOSIS — M25551 Pain in right hip: Secondary | ICD-10-CM | POA: Diagnosis not present

## 2019-11-24 NOTE — Therapy (Signed)
[facility] [address], [place], [place], [address] Phone: [phone]   Fax:  [phone]  Physical Therapy Treatment  Patient Details  Name: Tina Lucas MRN: [id number] Date of Birth: Jun 18, 1950 Referring Provider (PT): Eunice Blase, MD   Encounter Date: 11/24/2019   PT End of Session - 11/24/19 1256    Visit Number 2    Number of Visits 13    Date for PT Re-Evaluation 02/09/20    Authorization - Visit Number 2    Authorization - Number of Visits 13    PT Start Time 2353    PT Stop Time 1231    PT Time Calculation (min) 42 min    Activity Tolerance Patient tolerated treatment well    Behavior During Therapy [facility] for tasks assessed/performed           Past Medical History:  Diagnosis Date  . Allergic rhinitis   . Allergy   . Asthma    as a child  . 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 COPD (chronic obstructive pulmonary disease) ([place])   . History of asthma 1956-2000  . Hypothyroid 2013  . Osteopenia   . Primary HSV infection of mouth 2008  . Pulmonary nodule   . 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 [place] STUDY N/A 08/13/2019   Procedure: 24 HOUR PH STUDY- impedence;  Surgeon: Irving Copas., MD;  [facility];  Service: Gastroenterology;  Laterality: N/A;  . COLONOSCOPY    . ESOPHAGEAL MANOMETRY N/A 08/13/2019   Procedure: ESOPHAGEAL MANOMETRY (EM);  Surgeon: Irving Copas., MD;  [facility];  Service: Gastroenterology;  Laterality: N/A;  . [place] IMPEDANCE STUDY  08/13/2019   Procedure: [place] IMPEDANCE STUDY;  Surgeon: Rush Landmark Telford Nab., MD;  [facility];  Service: Gastroenterology;;  . polyp on vocal chord     . TONSILLECTOMY  1958    There were no vitals filed for this visit.   Subjective  Assessment - 11/24/19 1152    Subjective 2 sets at the gym with machines.  I didn't do the leg press because the Rt hip is bothering me today . I have done the gym MWF this week.  TTH I just did regular daily errands.  [place], but Sunday i went and walked again for 1 mile and then    Currently in Pain? Yes    Pain Score 6     Pain Location Hip    Pain Orientation Right;Lateral    Pain Descriptors / Indicators Aching    Pain Type Acute pain    Pain Onset More than a month ago    Pain Frequency Intermittent    Multiple Pain Sites No                             [facility] Adult PT Treatment/Exercise - 11/24/19 0001      Ambulation/Gait   Gait Comments Rt trendelenburg slight; less thoracic rotation to the Rt      Lumbar Exercises: Machines for Strengthening   Leg Press 30 lb - bilat 30x; 20lb single x20      Lumbar Exercises: Standing   Other Standing Lumbar Exercises educated and performed 3 x each - standing hip ext  and abduction - edu on using the band above the knees    Other Standing Lumbar Exercises walk with sports cord - 15lb fwd/back 6x each      Manual Therapy   Myofascial Release Rt thoracic paraspinals; Rt pecs - mobs to improve normal gait patterns on right side for improved muscle firing throughout rt kinetic chain                    PT Short Term Goals - 11/17/19 1523      PT SHORT TERM GOAL #1   Title Pt will be able to walk 25 min x 3 days/week without increased pain    Baseline 17-21 min with increased pain    Time 4    Period Weeks    Status New    Target Date 12/15/19      PT SHORT TERM GOAL #2   Title Pt will be ind with initial HEP    Time 4    Period Weeks    Status New    Target Date 12/15/19             PT Long Term Goals - 11/17/19 1524      PT LONG TERM GOAL #1   Title pt will be ind with advanced HEP    Time 12    Period Weeks    Status New    Target Date  02/09/20      PT LONG TERM GOAL #2   Title Pt will be able to walk 45 minutes 3x/week without increased pain in the hip    Time 12    Period Weeks    Status New    Target Date 02/09/20      PT LONG TERM GOAL #3   Title FOTO < or = to 37% limited    Baseline 56%    Time 12    Period Weeks    Status New    Target Date 02/09/20      PT LONG TERM GOAL #4   Title pt demonstrates ability to perform squat with weight evenly placed on BLE    Time 12    Period Weeks    Status New    Target Date 02/09/20      PT LONG TERM GOAL #5   Title Pt will have hip flexion, extension, and abduction improved to 5/5 so she can begin to work on return to [employer].    Time 12    Period Weeks    Status New    Target Date 02/09/20                 Plan - 11/24/19 1252    Clinical Impression Statement Pt was frustrated due to feeling like walking 2 days in a row caused hip to flare up very easily.  Pt demonstrates less rotation to the Rt and slight trendelenburg on Rt side during gait.  Pt was educated on activating her gluteas during walking and using sports core to emphasize that concept so she could feel the firing pattern.  Pt responded well tih VC.  Pt had no increased pain .  She is still getting thoracic pain and this is directly related to her gait so also incorporated STM of throracic spine and pecs in treatment today.           Patient will benefit from skilled therapeutic intervention in order to improve the following deficits and impairments:  Visit Diagnosis: Pain in right hip  Difficulty in walking, not elsewhere classified  Other muscle spasm  Cervicalgia     Problem List Patient Active Problem List   Diagnosis Date Noted  . Pain in right buttock 10/21/2019  . Belching   . Upper back pain on left side 07/15/2019  . Hiatal hernia 06/15/2019  . Functional heartburn 06/15/2019  . Hypersensitivity 06/15/2019  . Chest wall discomfort 07/22/2018  . Pyrosis  04/26/2018  . Positive colorectal cancer screening using Cologuard test 04/26/2018  . Gastric ulcer without hemorrhage or perforation 04/26/2018  . Abnormal findings on esophagogastroduodenoscopy (EGD) 04/26/2018  . Atypical chest pain 02/26/2018  . Asthma-COPD overlap syndrome ([place]) 01/31/2018  . Chronic neck pain 08/27/2017  . GERD (gastroesophageal reflux disease) 04/20/2016  . Numbness 08/06/2015  . Osteopenia 12/10/2014  . Allergic urticaria 12/10/2014  . Pressure in head 04/01/2014  . Vertigo 02/10/2014  . Elevated blood pressure reading 11/06/2013  . Hypothyroid   . Primary HSV infection of mouth     Jule Ser, PT 11/24/2019, 12:58 PM  [facility] [address], [place], [place], [address] Phone: [phone]   Fax:  [phone]  Name: Tina Lucas MRN: [id number] Date of Birth: 13-Dec-1950

## 2019-12-02 ENCOUNTER — Encounter: Payer: Self-pay | Admitting: Physical Therapy

## 2019-12-02 ENCOUNTER — Other Ambulatory Visit: Payer: Self-pay

## 2019-12-02 ENCOUNTER — Ambulatory Visit: Payer: Medicare PPO | Admitting: Physical Therapy

## 2019-12-02 DIAGNOSIS — M25551 Pain in right hip: Secondary | ICD-10-CM

## 2019-12-02 DIAGNOSIS — R262 Difficulty in walking, not elsewhere classified: Secondary | ICD-10-CM

## 2019-12-02 DIAGNOSIS — M62838 Other muscle spasm: Secondary | ICD-10-CM

## 2019-12-02 DIAGNOSIS — M542 Cervicalgia: Secondary | ICD-10-CM | POA: Diagnosis not present

## 2019-12-02 NOTE — Therapy (Signed)
[facility] [address], [place], [place], [address] Phone: [phone]   Fax:  [phone]  Physical Therapy Treatment  Patient Details  Name: Tina Lucas MRN: [id number] Date of Birth: [birth year] Referring Provider (PT): Eunice Blase, MD   Encounter Date: 12/02/2019   PT End of Session - 12/02/19 1110    Visit Number 3    Number of Visits 13    Date for PT Re-Evaluation 02/09/20    Authorization - Visit Number 3    Authorization - Number of Visits 13    PT Start Time 1106    PT Stop Time 1144    PT Time Calculation (min) 38 min    Activity Tolerance Patient tolerated treatment well    Behavior During Therapy [facility] for tasks assessed/performed           Past Medical History:  Diagnosis Date   Allergic rhinitis    Allergy    Asthma    as a child   Cataract    surgical correction, 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   COPD (chronic obstructive pulmonary disease) ([place])    History of asthma 1956-2000   Hypothyroid 2013   Osteopenia    Primary HSV infection of mouth 2008   Pulmonary nodule    Sunlight-induced angio-edema-urticaria 07/11/2011   unknown trigger, neg autoimmune workup    Past Surgical History:  Procedure Laterality Date   22 HOUR [place] STUDY N/A 08/13/2019   Procedure: 24 HOUR PH STUDY- impedence;  Surgeon: Irving Copas., MD;  [facility]: [facility];  Service: Gastroenterology;  Laterality: N/A;   COLONOSCOPY     ESOPHAGEAL MANOMETRY N/A 08/13/2019   Procedure: ESOPHAGEAL MANOMETRY (EM);  Surgeon: Irving Copas., MD;  Location: [facility];  Service: Gastroenterology;  Laterality: N/A;   [place] IMPEDANCE STUDY  08/13/2019   Procedure: [place] IMPEDANCE STUDY;  Surgeon: Rush Landmark Telford Nab., MD;  Location: [facility];  Service: Gastroenterology;;   polyp on vocal chord      TONSILLECTOMY  1958    There were no vitals filed for this visit.   Subjective  Assessment - 12/02/19 1108    Subjective Pt reports this week she did gym workoust, PT exercises, walking 2 mils some hurting; picle ball 1 73min.  Pt states the ice and stretches helped.    Pertinent History osteoporosis, COPD, esophogeal spasms (can cause chest pains), vertigo positional - cannot lie down    Currently in Pain? Yes    Pain Score 4    up to six after SLS ex   Pain Location Hip    Pain Orientation Right;Anterior    Pain Frequency Intermittent    Multiple Pain Sites No                             [facility] Adult PT Treatment/Exercise - 12/02/19 0001      Lumbar Exercises: Machines for Strengthening   Leg Press 30 lb - bilat 30x      Lumbar Exercises: Standing   Functional Squats Limitations SLS very small squat on Rt LE - cues to move pelvis back, hinge at hip and bend knee slightly    Other Standing Lumbar Exercises hip ext with knee bent - 10x with 3 sec hold      Manual Therapy   Myofascial Release TFL, iliopsoas; quads - all Rt side  PT Short Term Goals - 11/17/19 1523      PT SHORT TERM GOAL #1   Title Pt will be able to walk 25 min x 3 days/week without increased pain    Baseline 17-21 min with increased pain    Time 4    Period Weeks    Status New    Target Date 12/15/19      PT SHORT TERM GOAL #2   Title Pt will be ind with initial HEP    Time 4    Period Weeks    Status New    Target Date 12/15/19             PT Long Term Goals - 11/17/19 1524      PT LONG TERM GOAL #1   Title pt will be ind with advanced HEP    Time 12    Period Weeks    Status New    Target Date 02/09/20      PT LONG TERM GOAL #2   Title Pt will be able to walk 45 minutes 3x/week without increased pain in the hip    Time 12    Period Weeks    Status New    Target Date 02/09/20      PT LONG TERM GOAL #3   Title FOTO < or = to 37% limited    Baseline 56%    Time 12    Period Weeks    Status New    Target Date  02/09/20      PT LONG TERM GOAL #4   Title pt demonstrates ability to perform squat with weight evenly placed on BLE    Time 12    Period Weeks    Status New    Target Date 02/09/20      PT LONG TERM GOAL #5   Title Pt will have hip flexion, extension, and abduction improved to 5/5 so she can begin to work on return to [employer].    Time 12    Period Weeks    Status New    Target Date 02/09/20                 Plan - 12/02/19 1243    Clinical Impression Statement Today's session focused on strengthening glutes in standing to simulate stepping movement needed for return to pickle ball.  Pt needed heavy TC and VC for single leg mini squat.  Pt performed but unable to do without heavy cuing and did begin having anterior hip irritation.  Pt responded well to MFR and STM after ex's with improved pain level.  Overall, pt doing better and walked more this week.    PT Treatment/Interventions ADLs/Self Care Home Management;Biofeedback;Cryotherapy;Electrical Stimulation;Moist Heat;Therapeutic activities;Therapeutic exercise;Neuromuscular re-education;Patient/family education;Manual techniques;Passive range of motion;Taping;Dry needling;Iontophoresis 4mg /ml Dexamethasone;Ultrasound;Gait training    PT Next Visit Plan single leg standing, posture, glute strength in standing    PT Home Exercise Plan Access Code: [email or web address]    Consulted and Agree with Plan of Care Patient           Patient will benefit from skilled therapeutic intervention in order to improve the following deficits and impairments:  Increased fascial restricitons, Decreased range of motion, Decreased strength, Postural dysfunction, Pain, Difficulty walking, Abnormal gait, Increased muscle spasms  Visit Diagnosis: Pain in right hip  Difficulty in walking, not elsewhere classified  Other muscle spasm     Problem List Patient Active Problem List   Diagnosis Date Noted  Pain in right buttock 10/21/2019   Belching     Upper back pain on left side 07/15/2019   Hiatal hernia 06/15/2019   Functional heartburn 06/15/2019   Hypersensitivity 06/15/2019   Chest wall discomfort 07/22/2018   Pyrosis 04/26/2018   Positive colorectal cancer screening using Cologuard test 04/26/2018   Gastric ulcer without hemorrhage or perforation 04/26/2018   Abnormal findings on esophagogastroduodenoscopy (EGD) 04/26/2018   Atypical chest pain 02/26/2018   Asthma-COPD overlap syndrome ([place]) 01/31/2018   Chronic neck pain 08/27/2017   GERD (gastroesophageal reflux disease) 04/20/2016   Numbness 08/06/2015   Osteopenia 12/10/2014   Allergic urticaria 12/10/2014   Pressure in head 04/01/2014   Vertigo 02/10/2014   Elevated blood pressure reading 11/06/2013   Hypothyroid    Primary HSV infection of mouth     Camillo Flaming Amaal Dimartino,PT 12/02/2019, 12:53 PM  [facility] [address], [place], [place], [address] Phone: [phone]   Fax:  [phone]  Name: Tina Lucas MRN: [id number] Date of Birth: [birth year]

## 2019-12-09 ENCOUNTER — Ambulatory Visit: Payer: Medicare PPO | Admitting: Physical Therapy

## 2019-12-09 ENCOUNTER — Encounter: Payer: Self-pay | Admitting: Physical Therapy

## 2019-12-09 ENCOUNTER — Other Ambulatory Visit: Payer: Self-pay

## 2019-12-09 DIAGNOSIS — M25551 Pain in right hip: Secondary | ICD-10-CM | POA: Diagnosis not present

## 2019-12-09 DIAGNOSIS — M542 Cervicalgia: Secondary | ICD-10-CM | POA: Diagnosis not present

## 2019-12-09 DIAGNOSIS — M62838 Other muscle spasm: Secondary | ICD-10-CM | POA: Diagnosis not present

## 2019-12-09 DIAGNOSIS — R262 Difficulty in walking, not elsewhere classified: Secondary | ICD-10-CM | POA: Diagnosis not present

## 2019-12-09 NOTE — Therapy (Signed)
[facility] [address], [place], [place], [address] Phone: [phone]   Fax:  [phone]  Physical Therapy Treatment  Patient Details  Name: Tina Lucas MRN: [id number] Date of Birth: 1950-06-08 Referring Provider (PT): Eunice Blase, MD   Encounter Date: 12/09/2019   PT End of Session - 12/09/19 1152    Visit Number 4    Date for PT Re-Evaluation 02/09/20    Authorization Type [employer]    Authorization - Visit Number 4    Authorization - Number of Visits 13    PT Start Time 8003    PT Stop Time 1231    PT Time Calculation (min) 46 min    Activity Tolerance Patient tolerated treatment well    Behavior During Therapy [facility] for tasks assessed/performed           Past Medical History:  Diagnosis Date  . Allergic rhinitis   . Allergy   . Asthma    as a child  . 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 COPD (chronic obstructive pulmonary disease) ([place])   . History of asthma 1956-2000  . Hypothyroid 2013  . Osteopenia   . Primary HSV infection of mouth 2008  . Pulmonary nodule   . 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 [place] STUDY N/A 08/13/2019   Procedure: 24 HOUR PH STUDY- impedence;  Surgeon: Irving Copas., MD;  [facility];  Service: Gastroenterology;  Laterality: N/A;  . COLONOSCOPY    . ESOPHAGEAL MANOMETRY N/A 08/13/2019   Procedure: ESOPHAGEAL MANOMETRY (EM);  Surgeon: Irving Copas., MD;  [facility];  Service: Gastroenterology;  Laterality: N/A;  . [place] IMPEDANCE STUDY  08/13/2019   Procedure: [place] IMPEDANCE STUDY;  Surgeon: Rush Landmark Telford Nab., MD;  [facility];  Service: Gastroenterology;;  . polyp on vocal chord     . TONSILLECTOMY  1958    There were no vitals filed for this  visit.   Subjective Assessment - 12/09/19 1151    Subjective I walked today and stretched before and after.  I have increased my PT exercises and done the gym MWF.  Saturday I did a 54 min walk and it was tolerable.  I felt like I recovered on Sunday.  Monday I felt good but I still feel like something is not like it was before.  I increased my reps and weights at the gym but did not do any pickleball.    Pertinent History osteoporosis, COPD, esophogeal spasms (can cause chest pains), vertigo positional - cannot lie down    Patient Stated Goals reduce neck, shoulder, back, and hip pain so can walk and play pickle ball    Currently in Pain? Yes    Pain Score 3     Pain Location Hip    Pain Orientation Right;Lateral    Pain Descriptors / Indicators Aching    Pain Type Acute pain    Pain Onset More than a month ago    Pain Frequency Intermittent    Multiple Pain Sites No                             [facility] Adult PT Treatment/Exercise - 12/09/19 0001      Neuro Re-ed    Neuro Re-ed Details  educated and performed  side shuffle drills; glute med activation with SLS ex's      Lumbar Exercises: Stretches   Other Lumbar Stretch Exercise hip flexor and ITB stretch - 30 sec      Lumbar Exercises: Standing   Other Standing Lumbar Exercises SLS hip abduction with green loop - diagonals to the back - 20x      Manual Therapy   Myofascial Release glute med with addaday                    PT Short Term Goals - 12/09/19 1615      PT SHORT TERM GOAL #1   Title Pt will be able to walk 25 min x 3 days/week without increased pain    Baseline did >25 min x  2 days this week    Status Partially Met      PT SHORT TERM GOAL #2   Title Pt will be ind with initial HEP    Status Achieved             PT Long Term Goals - 11/17/19 1524      PT LONG TERM GOAL #1   Title pt will be ind with advanced HEP    Time 12    Period Weeks    Status New    Target Date 02/09/20       PT LONG TERM GOAL #2   Title Pt will be able to walk 45 minutes 3x/week without increased pain in the hip    Time 12    Period Weeks    Status New    Target Date 02/09/20      PT LONG TERM GOAL #3   Title FOTO < or = to 37% limited    Baseline 56%    Time 12    Period Weeks    Status New    Target Date 02/09/20      PT LONG TERM GOAL #4   Title pt demonstrates ability to perform squat with weight evenly placed on BLE    Time 12    Period Weeks    Status New    Target Date 02/09/20      PT LONG TERM GOAL #5   Title Pt will have hip flexion, extension, and abduction improved to 5/5 so she can begin to work on return to [employer].    Time 12    Period Weeks    Status New    Target Date 02/09/20                 Plan - 12/09/19 1230    Clinical Impression Statement Pt did well today's session.  She was able to add to HEP and tolerated more challenging exercises for glute med. Pt responded very well to addaday with reduced pain after treatment today.  Pt was educated on doing drills for lateral movements and demo in clinic today.  No increased pain and pt will benefit from skilled PT to continue to work on glute strength and return to sport.    PT Treatment/Interventions ADLs/Self Care Home Management;Biofeedback;Cryotherapy;Electrical Stimulation;Moist Heat;Therapeutic activities;Therapeutic exercise;Neuromuscular re-education;Patient/family education;Manual techniques;Passive range of motion;Taping;Dry needling;Iontophoresis 51m/ml Dexamethasone;Ultrasound;Gait training    PT Next Visit Plan f/u on drills; addaday for trigger point release; progress single leg ex's    PT Home Exercise Plan Access Code: [email or web address]    Consulted and Agree with Plan of Care Patient           Patient will benefit  from skilled therapeutic intervention in order to improve the following deficits and impairments:  Increased fascial restricitons, Decreased range of motion, Decreased  strength, Postural dysfunction, Pain, Difficulty walking, Abnormal gait, Increased muscle spasms  Visit Diagnosis: Pain in right hip  Difficulty in walking, not elsewhere classified  Other muscle spasm  Cervicalgia     Problem List Patient Active Problem List   Diagnosis Date Noted  . Pain in right buttock 10/21/2019  . Belching   . Upper back pain on left side 07/15/2019  . Hiatal hernia 06/15/2019  . Functional heartburn 06/15/2019  . Hypersensitivity 06/15/2019  . Chest wall discomfort 07/22/2018  . Pyrosis 04/26/2018  . Positive colorectal cancer screening using Cologuard test 04/26/2018  . Gastric ulcer without hemorrhage or perforation 04/26/2018  . Abnormal findings on esophagogastroduodenoscopy (EGD) 04/26/2018  . Atypical chest pain 02/26/2018  . Asthma-COPD overlap syndrome ([place]) 01/31/2018  . Chronic neck pain 08/27/2017  . GERD (gastroesophageal reflux disease) 04/20/2016  . Numbness 08/06/2015  . Osteopenia 12/10/2014  . Allergic urticaria 12/10/2014  . Pressure in head 04/01/2014  . Vertigo 02/10/2014  . Elevated blood pressure reading 11/06/2013  . Hypothyroid   . Primary HSV infection of mouth     Jule Ser, PT 12/09/2019, 4:20 PM  [facility] [address] [place], [place], [address] Phone: [phone]   Fax:  [phone]  Name: Tina Lucas MRN: [id number] Date of Birth: 27-Jan-1950

## 2019-12-15 ENCOUNTER — Encounter: Payer: Self-pay | Admitting: Pulmonary Disease

## 2019-12-15 ENCOUNTER — Other Ambulatory Visit: Payer: Self-pay

## 2019-12-15 ENCOUNTER — Ambulatory Visit: Payer: Medicare PPO | Admitting: Pulmonary Disease

## 2019-12-15 VITALS — BP 158/82 | HR 88 | Temp 97.2°F | Wt 120.4 lb

## 2019-12-15 DIAGNOSIS — R911 Solitary pulmonary nodule: Secondary | ICD-10-CM | POA: Diagnosis not present

## 2019-12-15 DIAGNOSIS — J449 Chronic obstructive pulmonary disease, unspecified: Secondary | ICD-10-CM

## 2019-12-15 NOTE — Progress Notes (Signed)
Subjective:   PATIENT ID: [name] GENDER: female DOB: [birth year], MRN: [id number]   HPI  Chief Complaint  Patient presents with  . Follow-up    6 month follow up--Asthma/COPD    Reason for Visit: Follow-up  [name] is a 69 year old never smoker with childhood asthma (shots, nebulizers and inhalers) who presents for follow-up.  Since our last visit, she has been compliant with her Dulera. She uses her Xopenex daily prior to activity. Otherwise she does not use her rescue inhaler.  Her asthma/COPD is well-controlled. She is fairly active however recently had muscle injury and participating in physical therapy. She is going to the gym for pickleball and weights. She walks daily at least for an hour.  She has shortness of breath from these activities but this is expected.  Her symptoms are usually triggered by heat and activity.  Denies cough and wheezing.  Continues to have symptoms of heartburn not controlled on PPI.  Her PCP and GI specialist is managing this  Asthma Control Test ACT Total Score  12/15/2019 20  05/19/2019 12    Social History: Never smoker Retired Pharmacist, hospital MS degree in exercise physiology No exposures to COVID-19.  She lives in [place] near her grandchildren  Environmental exposures:  Significant second-hand (father) smoke exposure and noticeably improved after moving for college.   No known or significant exposures to chemicals, raw materials or metals  I have personally reviewed patient's past medical/family/social history, allergies and current medications.  Past Medical History:  Diagnosis Date  . Allergic rhinitis   . Allergy   . Asthma    as a child  . 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 COPD (chronic obstructive pulmonary disease) ([place])   . History of asthma 1956-2000  . Hypothyroid 2013  . Osteopenia   . Primary HSV infection of mouth 2008  . Pulmonary nodule   . Sunlight-induced  angio-edema-urticaria 07/11/2011   unknown trigger, neg autoimmune workup     Outpatient Medications Prior to Visit  Medication Sig Dispense Refill  . Calcium Carbonate-Vitamin D (CALTRATE 600+D) 600-400 MG-UNIT per tablet Take 1 tablet by mouth daily.    . celecoxib (CELEBREX) 100 MG capsule TAKE ONE CAPSULE BY MOUTH TWICE A DAY 180 capsule 3  . cholecalciferol (VITAMIN D) 400 UNITS TABS tablet Take 400 Units by mouth.    . fexofenadine (ALLEGRA) 180 MG tablet Take 180 mg by mouth daily.    . fluocinonide cream (LIDEX) 6.96 % Apply 1 application topically 2 (two) times daily as needed (FOR PSORIASIS).     . hydrocortisone 2.5 % cream     . levalbuterol (XOPENEX HFA) 45 MCG/ACT inhaler INHALE ONE TO TWO PUFFS BY MOUTH EVERY 4 HOURS AS NEEDED FOR WHEEZING 15 g 2  . levothyroxine (SYNTHROID) 50 MCG tablet Take 1 tablet (50 mcg total) by mouth daily with breakfast. 90 tablet 3  . Magnesium 250 MG TABS Take 125 mg by mouth daily.    . mometasone-formoterol (DULERA) 200-5 MCG/ACT AERO Inhale 2 puffs into the lungs 2 (two) times daily. Appt needed for future refills. 13 g 11  . Multiple Vitamins-Minerals (ONE-A-DAY WOMENS 50+ ADVANTAGE PO) Take by mouth daily.    Marland Kitchen omeprazole (PRILOSEC) 40 MG capsule Take 1 capsule (40 mg total) by mouth 2 (two) times daily before a meal. 180 capsule 1  . prednisoLONE acetate (PRED FORTE) 1 % ophthalmic suspension Place 1 drop into the right eye daily.  Pt takes 2 times per week    . triamcinolone (KENALOG) 0.1 % paste      No facility-administered medications prior to visit.    Review of Systems  Constitutional: Negative for chills, diaphoresis, fever, malaise/fatigue and weight loss.  HENT: Negative for congestion.   Respiratory: Positive for shortness of breath. Negative for cough, hemoptysis, sputum production and wheezing.   Cardiovascular: Negative for chest pain, palpitations and leg swelling.  Gastrointestinal: Positive for heartburn.    Objective:    Vitals:   12/15/19 0947  BP: (!) 158/82  Pulse: 88  Temp: (!) 97.2 F (36.2 C)  TempSrc: Tympanic  SpO2: 97%  Weight: 120 lb 6 oz (54.6 kg)      Physical Exam: General: Well-appearing, no acute distress HENT: [place], AT, OP clear, MMM Eyes: EOMI, no scleral icterus Respiratory: Clear to auscultation bilaterally.  No crackles, wheezing or rales Cardiovascular: RRR, -M/R/G, no JVD Extremities:-Edema,-tenderness Neuro: AAO x4, CNII-XII grossly intact Skin: Intact, no rashes or bruising Psych: Normal mood, normal affect  Data Reviewed:  Imaging: CT Chest 08/01/18- RUL GGO nodule measured 69mm and solid nodule measured 35mm CT Chest 01/30/19 - RUL nodule increased to 33mm. Scattered lung nodules <78mm in left and right upper lobes.  PFT: 08/27/2018 FVC 2.78 (78 %) FEV1 1.76 (65 %) Ratio 63 TLC 117 % RV 175% RV/TLC 144% DLCO 107 % Interpretation: Moderate obstructive defect with air trapping.  No significant bronchodilator response.  Normal TLC.  Normal DLCO  Labs: CBC 08/06/18 9.5% Eos with 500 eos. Since 2015 absolute eos range from 400-600. CBC 03/31/2019 3.4% Eos with 200 absolute eos  Imaging, labs and tests noted above have been reviewed independently by me.    Assessment & Plan:   Discussion: 69 year old female never smoker with history of childhood asthma who presents with asthma-COPD overlap syndrome.  Prior labs with eosinophilia ranging from 400-600 since 2015.  Recent absolute eos 200.  Her symptoms are well controlled on ICS-LABA.  We reviewed CT imaging in clinic.  We briefly discussed Biologics for eosinophilia control however due to her high functional status I do not see an indication to start this.  Moderate COPD/Asthma Overlap Syndrome - well-controlled CONTINUE Dulera 200-5 mcg TWO puffs TWICE a day. THIS IS YOUR EVERY DAY INHALER CONTINUE Xopenex inhaler as needed for chest tightness. OK to take before activity.  Right upper lobe lung nodules <50mm Reviewed CT  chest in-clinic.  Recommend CT Chest without contrast in January 2022 Patient requests results to be delivered via [facility] Maintenance Immunization History  Administered Date(s) Administered  . Fluad Quad(high Dose 65+) 10/09/2018  . Influenza Split 10/30/2013  . Influenza, High Dose Seasonal PF 10/25/2017, 10/17/2019  . Influenza-Unspecified 10/19/2014, 10/19/2015, 10/27/2016  . PFIZER SARS-COV-2 Vaccination 02/12/2019, 03/05/2019  . Pneumococcal Conjugate-13 11/28/2013  . Pneumococcal Polysaccharide-23 03/29/2018  . Zoster 02/18/2009   Orders Placed This Encounter  Procedures  . CT Chest Wo Contrast    Standing Status:   Future    Standing Expiration Date:   12/14/2020    Order Specific Question:   Preferred imaging location?    Answer:   [place]   No orders of the defined types were placed in this encounter.  No follow-ups on file.  I have spent a total time of 31-minutes on the day of the appointment reviewing prior documentation, coordinating care and discussing medical diagnosis and plan with the patient/family. Imaging, labs and tests included in this note have  been reviewed and interpreted independently by me.   Dam[place]on [place], MD [facility] 12/15/2019 9:43 AM  Office Number [phone]

## 2019-12-15 NOTE — Patient Instructions (Addendum)
Moderate COPD/Asthma Overlap Syndrome - well-controlled CONTINUE Dulera 200-5 mcg TWO puffs TWICE a day. THIS IS YOUR EVERY DAY INHALER CONTINUE Xopenex inhaler as needed for chest tightness. OK to take before activity.  Right upper lobe lung nodules <80mm Reviewed CT chest in-clinic.  Recommend CT Chest without contrast in January 2022  Follow-up with me in 2-3 months after CT Chest

## 2019-12-24 ENCOUNTER — Encounter: Payer: Self-pay | Admitting: Physical Therapy

## 2019-12-24 ENCOUNTER — Other Ambulatory Visit: Payer: Self-pay

## 2019-12-24 ENCOUNTER — Ambulatory Visit: Payer: Medicare PPO | Attending: Family Medicine | Admitting: Physical Therapy

## 2019-12-24 DIAGNOSIS — M25551 Pain in right hip: Secondary | ICD-10-CM

## 2019-12-24 DIAGNOSIS — M62838 Other muscle spasm: Secondary | ICD-10-CM | POA: Diagnosis not present

## 2019-12-24 DIAGNOSIS — M542 Cervicalgia: Secondary | ICD-10-CM

## 2019-12-24 DIAGNOSIS — R262 Difficulty in walking, not elsewhere classified: Secondary | ICD-10-CM | POA: Diagnosis not present

## 2019-12-24 NOTE — Therapy (Signed)
[facility] [address] [place], [place], [address] Phone: [phone]   Fax:  [phone]  Physical Therapy Treatment  Patient Details  Name: Tina Lucas MRN: [id number] Date of Birth: [birth year] Referring Provider (PT): Eunice Blase, MD   Encounter Date: 12/24/2019   PT End of Session - 12/24/19 1156    Visit Number 5    Number of Visits 13    Date for PT Re-Evaluation 02/09/20    Authorization Type humana medicare    Authorization - Visit Number 5    Authorization - Number of Visits 13    PT Start Time 4332    PT Stop Time 1227    PT Time Calculation (min) 38 min    Activity Tolerance Patient tolerated treatment well    Behavior During Therapy [facility] for tasks assessed/performed           Past Medical History:  Diagnosis Date  . Allergic rhinitis   . Allergy   . Asthma    as a child  . 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 COPD (chronic obstructive pulmonary disease) ([place])   . History of asthma 1956-2000  . Hypothyroid 2013  . Osteopenia   . Primary HSV infection of mouth 2008  . Pulmonary nodule   . 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 [place] STUDY N/A 08/13/2019   Procedure: 24 HOUR PH STUDY- impedence;  Surgeon: Irving Copas., MD;  Location: [facility];  Service: Gastroenterology;  Laterality: N/A;  . COLONOSCOPY    . ESOPHAGEAL MANOMETRY N/A 08/13/2019   Procedure: ESOPHAGEAL MANOMETRY (EM);  Surgeon: Irving Copas., MD;  [facility];  Service: Gastroenterology;  Laterality: N/A;  . [place] IMPEDANCE STUDY  08/13/2019   Procedure: [place] IMPEDANCE STUDY;  Surgeon: Rush Landmark Telford Nab., MD;  Location: [facility];  Service: Gastroenterology;;  . polyp on vocal chord     . TONSILLECTOMY  1958    There were no vitals  filed for this visit.   Subjective Assessment - 12/24/19 1152    Subjective I did some walking and I will feel it in the lateral hip afterwards.  Same thing with the gym.    Currently in Pain? Yes    Pain Score 5     Pain Location Hip    Pain Orientation Right;Lateral    Pain Descriptors / Indicators Aching    Pain Type Acute pain    Pain Onset More than a month ago    Pain Frequency Intermittent    Multiple Pain Sites No                             [facility] Adult PT Treatment/Exercise - 12/24/19 0001      Self-Care   Other Self-Care Comments  reviewed doing pickleball movements inside for more consistency      Manual Therapy   Myofascial Release Rt lower quadrant to release ligaments surrounding cecum; Upper left quadrant around stomach for improved thoracic mobility          did practice drills with landing on LE in athletic stance to simulate pickleball moves          PT Short Term Goals - 12/09/19 1615      PT SHORT TERM GOAL #1   Title Pt will  be able to walk 25 min x 3 days/week without increased pain    Baseline did >25 min x  2 days this week    Status Partially Met      PT SHORT TERM GOAL #2   Title Pt will be ind with initial HEP    Status Achieved             PT Long Term Goals - 11/17/19 1524      PT LONG TERM GOAL #1   Title pt will be ind with advanced HEP    Time 12    Period Weeks    Status New    Target Date 02/09/20      PT LONG TERM GOAL #2   Title Pt will be able to walk 45 minutes 3x/week without increased pain in the hip    Time 12    Period Weeks    Status New    Target Date 02/09/20      PT LONG TERM GOAL #3   Title FOTO < or = to 37% limited    Baseline 56%    Time 12    Period Weeks    Status New    Target Date 02/09/20      PT LONG TERM GOAL #4   Title pt demonstrates ability to perform squat with weight evenly placed on BLE    Time 12    Period Weeks    Status New    Target Date 02/09/20       PT LONG TERM GOAL #5   Title Pt will have hip flexion, extension, and abduction improved to 5/5 so she can begin to work on return to [employer].    Time 12    Period Weeks    Status New    Target Date 02/09/20                 Plan - 12/24/19 1318    Clinical Impression Statement Pt responded well to MFR to Rt lower quadrant and reports feeling better after treatment.  Pt has not been doing pickleball at all but seems to be showing progress with ability to walk over 30 minutes 2 days in a row.  Pt will benefit from skilled PT to improve function and return to sport.    PT Treatment/Interventions ADLs/Self Care Home Management;Biofeedback;Cryotherapy;Electrical Stimulation;Moist Heat;Therapeutic activities;Therapeutic exercise;Neuromuscular re-education;Patient/family education;Manual techniques;Passive range of motion;Taping;Dry needling;Iontophoresis 77m/ml Dexamethasone;Ultrasound;Gait training    PT Next Visit Plan f/u on drills; fascial releases for improved trunk mobility as needed    PT Home Exercise Plan Access Code: [email or web address]    Consulted and Agree with Plan of Care Patient           Patient will benefit from skilled therapeutic intervention in order to improve the following deficits and impairments:  Increased fascial restricitons, Decreased range of motion, Decreased strength, Postural dysfunction, Pain, Difficulty walking, Abnormal gait, Increased muscle spasms  Visit Diagnosis: Pain in right hip  Difficulty in walking, not elsewhere classified  Other muscle spasm  Cervicalgia     Problem List Patient Active Problem List   Diagnosis Date Noted  . Pain in right buttock 10/21/2019  . Belching   . Upper back pain on left side 07/15/2019  . Hiatal hernia 06/15/2019  . Functional heartburn 06/15/2019  . Hypersensitivity 06/15/2019  . Chest wall discomfort 07/22/2018  . Pyrosis 04/26/2018  . Positive colorectal cancer screening using Cologuard test 04/26/2018   . Gastric ulcer without hemorrhage  or perforation 04/26/2018  . Abnormal findings on esophagogastroduodenoscopy (EGD) 04/26/2018  . Atypical chest pain 02/26/2018  . Asthma-COPD overlap syndrome ([place]) 01/31/2018  . Chronic neck pain 08/27/2017  . GERD (gastroesophageal reflux disease) 04/20/2016  . Numbness 08/06/2015  . Osteopenia 12/10/2014  . Allergic urticaria 12/10/2014  . Pressure in head 04/01/2014  . Vertigo 02/10/2014  . Elevated blood pressure reading 11/06/2013  . Hypothyroid   . Primary HSV infection of mouth     Jule Ser, PT 12/24/2019, 1:22 PM  [facility] [address] [place], [place], [address] Phone: [phone]   Fax:  [phone]  Name: Ayannah Faddis MRN: [id number] Date of Birth: 1950-05-14

## 2019-12-30 ENCOUNTER — Other Ambulatory Visit: Payer: Self-pay

## 2019-12-30 ENCOUNTER — Ambulatory Visit: Payer: Medicare PPO | Admitting: Physical Therapy

## 2019-12-30 DIAGNOSIS — M62838 Other muscle spasm: Secondary | ICD-10-CM | POA: Diagnosis not present

## 2019-12-30 DIAGNOSIS — M542 Cervicalgia: Secondary | ICD-10-CM | POA: Diagnosis not present

## 2019-12-30 DIAGNOSIS — R262 Difficulty in walking, not elsewhere classified: Secondary | ICD-10-CM | POA: Diagnosis not present

## 2019-12-30 DIAGNOSIS — M25551 Pain in right hip: Secondary | ICD-10-CM | POA: Diagnosis not present

## 2019-12-30 NOTE — Therapy (Signed)
[facility] [address], [place], [place], [address] Phone: [phone]   Fax:  [phone]  Physical Therapy Treatment  Patient Details  Name: Tina Lucas MRN: [id number] Date of Birth: 17-Dec-1950 Referring Provider (PT): Eunice Blase, MD   Encounter Date: 12/30/2019   PT End of Session - 12/30/19 1721    Visit Number 6    Number of Visits 13    Date for PT Re-Evaluation 02/09/20    Authorization Type humana medicare    Authorization - Visit Number 6    Authorization - Number of Visits 13    PT Start Time 1150    PT Stop Time 1231    PT Time Calculation (min) 41 min    Activity Tolerance Patient tolerated treatment well    Behavior During Therapy [facility] for tasks assessed/performed           Past Medical History:  Diagnosis Date  . Allergic rhinitis   . Allergy   . Asthma    as a child  . 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 COPD (chronic obstructive pulmonary disease) ([place])   . History of asthma 1956-2000  . Hypothyroid 2013  . Osteopenia   . Primary HSV infection of mouth 2008  . Pulmonary nodule   . 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 [place] STUDY N/A 08/13/2019   Procedure: 24 HOUR PH STUDY- impedence;  Surgeon: Irving Copas., MD;  [facility];  Service: Gastroenterology;  Laterality: N/A;  . COLONOSCOPY    . ESOPHAGEAL MANOMETRY N/A 08/13/2019   Procedure: ESOPHAGEAL MANOMETRY (EM);  Surgeon: Irving Copas., MD;  [facility];  Service: Gastroenterology;  Laterality: N/A;  . [place] IMPEDANCE STUDY  08/13/2019   Procedure: [place] IMPEDANCE STUDY;  Surgeon: Rush Landmark Telford Nab., MD;  [facility];  Service: Gastroenterology;;  . polyp on vocal chord     . TONSILLECTOMY  1958    There were no vitals  filed for this visit.   Subjective Assessment - 12/30/19 1152    Subjective I hit with my husband in the driveway for 15 minutes and walked 34 minutes and it was tolerable.  Sunday I hit 20 minutes and didn't walk, then grandsons came over.  I hit 20-25 nminutes again with grandsons and then I could tell COPD was flared up but the hip was not bad, just a little sore.  Same schedule MWF gym with 30 min bike and all the machines.  TTH I get a walk or steps same.  Yesterday I went to the gym, 3 sets of everything and not was hurting.  Finally I did abduction/adduction.  After that I hit the ball at the gym for 15 minutes and I don't feel any issues today.    Pertinent History osteoporosis, COPD, esophogeal spasms (can cause chest pains), vertigo positional - cannot lie down    Patient Stated Goals reduce neck, shoulder, back, and hip pain so can walk and play pickle ball                             [facility] Adult PT Treatment/Exercise - 12/30/19 0001      Manual Therapy   Myofascial Release Rt lower quadrant and Lt upper quadrant, cardiac sphincter, hip flexor Rt side only  PT Short Term Goals - 12/09/19 1615      PT SHORT TERM GOAL #1   Title Pt will be able to walk 25 min x 3 days/week without increased pain    Baseline did >25 min x  2 days this week    Status Partially Met      PT SHORT TERM GOAL #2   Title Pt will be ind with initial HEP    Status Achieved             PT Long Term Goals - 11/17/19 1524      PT LONG TERM GOAL #1   Title pt will be ind with advanced HEP    Time 12    Period Weeks    Status New    Target Date 02/09/20      PT LONG TERM GOAL #2   Title Pt will be able to walk 45 minutes 3x/week without increased pain in the hip    Time 12    Period Weeks    Status New    Target Date 02/09/20      PT LONG TERM GOAL #3   Title FOTO < or = to 37% limited    Baseline 56%    Time 12    Period Weeks    Status New     Target Date 02/09/20      PT LONG TERM GOAL #4   Title pt demonstrates ability to perform squat with weight evenly placed on BLE    Time 12    Period Weeks    Status New    Target Date 02/09/20      PT LONG TERM GOAL #5   Title Pt will have hip flexion, extension, and abduction improved to 5/5 so she can begin to work on return to [employer].    Time 12    Period Weeks    Status New    Target Date 02/09/20                 Plan - 12/30/19 1235    Clinical Impression Statement Pt making good progress this week with ability to play pickleball 3 x in 2 days and no increased pain.  Pt responded well to MFR in abdomen today and had a little less dicomfort in hip with walking when she left the treatment session. Pt will benefit from skilled PT to continue working on soft tissue mobility for improved body mechanics during exercises and walking.    PT Treatment/Interventions ADLs/Self Care Home Management;Biofeedback;Cryotherapy;Electrical Stimulation;Moist Heat;Therapeutic activities;Therapeutic exercise;Neuromuscular re-education;Patient/family education;Manual techniques;Passive range of motion;Taping;Dry needling;Iontophoresis 72m/ml Dexamethasone;Ultrasound;Gait training    PT Next Visit Plan fascial release abdomen, TFL, and upper thoracic for improved mobility    PT Home Exercise Plan Access Code: [email or web address]    Consulted and Agree with Plan of Care Patient           Patient will benefit from skilled therapeutic intervention in order to improve the following deficits and impairments:  Increased fascial restricitons, Decreased range of motion, Decreased strength, Postural dysfunction, Pain, Difficulty walking, Abnormal gait, Increased muscle spasms  Visit Diagnosis: Pain in right hip  Difficulty in walking, not elsewhere classified  Other muscle spasm  Cervicalgia     Problem List Patient Active Problem List   Diagnosis Date Noted  . Pain in right buttock  10/21/2019  . Belching   . Upper back pain on left side 07/15/2019  . Hiatal hernia 06/15/2019  . Functional heartburn  06/15/2019  . Hypersensitivity 06/15/2019  . Chest wall discomfort 07/22/2018  . Pyrosis 04/26/2018  . Positive colorectal cancer screening using Cologuard test 04/26/2018  . Gastric ulcer without hemorrhage or perforation 04/26/2018  . Abnormal findings on esophagogastroduodenoscopy (EGD) 04/26/2018  . Atypical chest pain 02/26/2018  . Asthma-COPD overlap syndrome ([place]) 01/31/2018  . Chronic neck pain 08/27/2017  . GERD (gastroesophageal reflux disease) 04/20/2016  . Numbness 08/06/2015  . Osteopenia 12/10/2014  . Allergic urticaria 12/10/2014  . Pressure in head 04/01/2014  . Vertigo 02/10/2014  . Elevated blood pressure reading 11/06/2013  . Hypothyroid   . Primary HSV infection of mouth     Jule Ser, PT 12/30/2019, 5:22 PM  [facility] [address], [place], [place], [address] Phone: [phone]   Fax:  [phone]  Name: Samanthamarie Ezzell MRN: [id number] Date of Birth: [birth year]

## 2020-01-07 ENCOUNTER — Other Ambulatory Visit: Payer: Self-pay

## 2020-01-07 ENCOUNTER — Ambulatory Visit: Payer: Medicare PPO | Admitting: Physical Therapy

## 2020-01-07 ENCOUNTER — Encounter: Payer: Self-pay | Admitting: Physical Therapy

## 2020-01-07 DIAGNOSIS — M542 Cervicalgia: Secondary | ICD-10-CM

## 2020-01-07 DIAGNOSIS — M62838 Other muscle spasm: Secondary | ICD-10-CM | POA: Diagnosis not present

## 2020-01-07 DIAGNOSIS — M25551 Pain in right hip: Secondary | ICD-10-CM | POA: Diagnosis not present

## 2020-01-07 DIAGNOSIS — R262 Difficulty in walking, not elsewhere classified: Secondary | ICD-10-CM | POA: Diagnosis not present

## 2020-01-07 NOTE — Therapy (Signed)
[facility] [address], [place], [place], [address] Phone: [phone]   Fax:  [phone]  Physical Therapy Treatment  Patient Details  Name: Tina Lucas MRN: [id number] Date of Birth: 15-Jun-1950 Referring Provider (PT): Eunice Blase, MD   Encounter Date: 01/07/2020   PT End of Session - 01/07/20 1233    Visit Number 7    Number of Visits 13    Date for PT Re-Evaluation 02/09/20    Authorization Type humana medicare    Authorization - Visit Number 7    Authorization - Number of Visits 13    PT Start Time 2878    PT Stop Time 1232    PT Time Calculation (min) 47 min    Activity Tolerance Patient tolerated treatment well    Behavior During Therapy [facility] for tasks assessed/performed           Past Medical History:  Diagnosis Date  . Allergic rhinitis   . Allergy   . Asthma    as a child  . 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 COPD (chronic obstructive pulmonary disease) ([place])   . History of asthma 1956-2000  . Hypothyroid 2013  . Osteopenia   . Primary HSV infection of mouth 2008  . Pulmonary nodule   . 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 [place] STUDY N/A 08/13/2019   Procedure: 24 HOUR PH STUDY- impedence;  Surgeon: Irving Copas., MD;  [facility];  Service: Gastroenterology;  Laterality: N/A;  . COLONOSCOPY    . ESOPHAGEAL MANOMETRY N/A 08/13/2019   Procedure: ESOPHAGEAL MANOMETRY (EM);  Surgeon: Irving Copas., MD;  [facility];  Service: Gastroenterology;  Laterality: N/A;  . [place] IMPEDANCE STUDY  08/13/2019   Procedure: [place] IMPEDANCE STUDY;  Surgeon: Rush Landmark Telford Nab., MD;  [facility];  Service: Gastroenterology;;  . polyp on vocal chord     . TONSILLECTOMY  1958    There were no vitals  filed for this visit.   Subjective Assessment - 01/07/20 1233    Subjective Pt has made progress with ability to play 3 and 4 games of pickleball on separate occasions.  Pt did not have any hip issues but Friday she had lung and esphogeal pain.    Pertinent History osteoporosis, COPD, esophogeal spasms (can cause chest pains), vertigo positional - cannot lie down    Diagnostic tests [name] image shows inflammation in Rt gluteals and around greater trochanter on Rt    Patient Stated Goals reduce neck, shoulder, back, and hip pain so can walk and play pickle ball    Currently in Pain? No/denies                             [facility] Adult PT Treatment/Exercise - 01/07/20 0001      Self-Care   Other Self-Care Comments  reviewed techniques for adding more activities during STM      Manual Therapy   Soft tissue mobilization cervical paraspinals and suboccipitals; Rt gluteus med and TFL and hip flexors    Myofascial Release pec major/minor bilateral Rt>Lt; ribcage rotation Rib 1-6; T1 into upper trap; bicep/tricep Rt side only                    PT Short Term Goals -  12/09/19 1615      PT SHORT TERM GOAL #1   Title Pt will be able to walk 25 min x 3 days/week without increased pain    Baseline did >25 min x  2 days this week    Status Partially Met      PT SHORT TERM GOAL #2   Title Pt will be ind with initial HEP    Status Achieved             PT Long Term Goals - 11/17/19 1524      PT LONG TERM GOAL #1   Title pt will be ind with advanced HEP    Time 12    Period Weeks    Status New    Target Date 02/09/20      PT LONG TERM GOAL #2   Title Pt will be able to walk 45 minutes 3x/week without increased pain in the hip    Time 12    Period Weeks    Status New    Target Date 02/09/20      PT LONG TERM GOAL #3   Title FOTO < or = to 37% limited    Baseline 56%    Time 12    Period Weeks    Status New    Target Date 02/09/20      PT LONG TERM GOAL  #4   Title pt demonstrates ability to perform squat with weight evenly placed on BLE    Time 12    Period Weeks    Status New    Target Date 02/09/20      PT LONG TERM GOAL #5   Title Pt will have hip flexion, extension, and abduction improved to 5/5 so she can begin to work on return to [employer].    Time 12    Period Weeks    Status New    Target Date 02/09/20                 Plan - 01/07/20 1235    Clinical Impression Statement Pt did well with treatment today and focuse was on improved thoracic mobility with fascial release in upper ribcage and into Rt shoulder and tricep.  Pt states she noticed "feeling something" in Rt tricep that seemed concerning to her.  Pt felt less tenderness in that area after treatment and overall improved movement of thoracic rotation as noted by pt after today's treatment. Pt will benefit from skilled PT to work on continued pain management as she makes her full return to sports    Comorbidities osteoporosis, COPD, esophogeal spasms (can cause chest pains), vertigo (unable to lie supine or sidelying)    PT Treatment/Interventions ADLs/Self Care Home Management;Biofeedback;Cryotherapy;Electrical Stimulation;Moist Heat;Therapeutic activities;Therapeutic exercise;Neuromuscular re-education;Patient/family education;Manual techniques;Passive range of motion;Taping;Dry needling;Iontophoresis 52m/ml Dexamethasone;Ultrasound;Gait training    PT Next Visit Plan fascial release abdomen, TFL, and upper thoracic for improved mobility    PT Home Exercise Plan Access Code: [email or web address]    Consulted and Agree with Plan of Care Patient           Patient will benefit from skilled therapeutic intervention in order to improve the following deficits and impairments:  Increased fascial restricitons,Decreased range of motion,Decreased strength,Postural dysfunction,Pain,Difficulty walking,Abnormal gait,Increased muscle spasms  Visit Diagnosis: Pain in right  hip  Difficulty in walking, not elsewhere classified  Other muscle spasm  Cervicalgia     Problem List Patient Active Problem List   Diagnosis Date Noted  . Pain in  right buttock 10/21/2019  . Belching   . Upper back pain on left side 07/15/2019  . Hiatal hernia 06/15/2019  . Functional heartburn 06/15/2019  . Hypersensitivity 06/15/2019  . Chest wall discomfort 07/22/2018  . Pyrosis 04/26/2018  . Positive colorectal cancer screening using Cologuard test 04/26/2018  . Gastric ulcer without hemorrhage or perforation 04/26/2018  . Abnormal findings on esophagogastroduodenoscopy (EGD) 04/26/2018  . Atypical chest pain 02/26/2018  . Asthma-COPD overlap syndrome ([place]) 01/31/2018  . Chronic neck pain 08/27/2017  . GERD (gastroesophageal reflux disease) 04/20/2016  . Numbness 08/06/2015  . Osteopenia 12/10/2014  . Allergic urticaria 12/10/2014  . Pressure in head 04/01/2014  . Vertigo 02/10/2014  . Elevated blood pressure reading 11/06/2013  . Hypothyroid   . Primary HSV infection of mouth     Jule Ser, PT 01/07/2020, 1:25 PM  [facility] [address], [place], [place], [address] Phone: [phone]   Fax:  [phone]  Name: Azyiah Bo MRN: [id number] Date of Birth: May 24, 1950

## 2020-01-08 ENCOUNTER — Telehealth: Payer: Self-pay | Admitting: Pulmonary Disease

## 2020-01-13 NOTE — Telephone Encounter (Signed)
Pt aware of this appt and it has been authorized Joellen Jersey

## 2020-01-13 NOTE — Telephone Encounter (Signed)
I don't have this one.  Does anyone else have this one?

## 2020-01-13 NOTE — Telephone Encounter (Signed)
Scheduled ct 01/30/20@11 :15am @[facility]   lmtcb Joellen Jersey

## 2020-01-14 ENCOUNTER — Ambulatory Visit: Payer: Medicare PPO | Admitting: Physical Therapy

## 2020-01-14 ENCOUNTER — Encounter: Payer: Self-pay | Admitting: Physical Therapy

## 2020-01-14 ENCOUNTER — Ambulatory Visit: Payer: Medicare PPO | Admitting: Podiatry

## 2020-01-14 ENCOUNTER — Encounter: Payer: Self-pay | Admitting: Podiatry

## 2020-01-14 ENCOUNTER — Other Ambulatory Visit: Payer: Self-pay

## 2020-01-14 DIAGNOSIS — M79674 Pain in right toe(s): Secondary | ICD-10-CM | POA: Diagnosis not present

## 2020-01-14 DIAGNOSIS — M25551 Pain in right hip: Secondary | ICD-10-CM | POA: Diagnosis not present

## 2020-01-14 DIAGNOSIS — Q828 Other specified congenital malformations of skin: Secondary | ICD-10-CM

## 2020-01-14 DIAGNOSIS — R262 Difficulty in walking, not elsewhere classified: Secondary | ICD-10-CM

## 2020-01-14 DIAGNOSIS — M79675 Pain in left toe(s): Secondary | ICD-10-CM

## 2020-01-14 DIAGNOSIS — B351 Tinea unguium: Secondary | ICD-10-CM | POA: Diagnosis not present

## 2020-01-14 DIAGNOSIS — M62838 Other muscle spasm: Secondary | ICD-10-CM | POA: Diagnosis not present

## 2020-01-14 DIAGNOSIS — M542 Cervicalgia: Secondary | ICD-10-CM

## 2020-01-14 NOTE — Patient Instructions (Signed)
REUSABLE NON-MEDICATED U-SHAPED GEL CALLUS CUSHION CAN BE PURCHASED AT [email or web address]  

## 2020-01-14 NOTE — Therapy (Signed)
[facility] [address], [place], [place], [address] Phone: [phone]   Fax:  [phone]  Physical Therapy Treatment  Patient Details  Name: Tina Lucas MRN: [id number] Date of Birth: 04-21-1950 Referring Provider (PT): Eunice Blase, MD   Encounter Date: 01/14/2020   PT End of Session - 01/14/20 1152    Visit Number 8    Number of Visits 13    Date for PT Re-Evaluation 02/09/20    Authorization Type humana medicare    Authorization - Visit Number 8    Authorization - Number of Visits 13    PT Start Time 4765    PT Stop Time 1226    PT Time Calculation (min) 41 min    Activity Tolerance Patient tolerated treatment well    Behavior During Therapy [facility] for tasks assessed/performed           Past Medical History:  Diagnosis Date  . Allergic rhinitis   . Allergy   . Asthma    as a child  . 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 COPD (chronic obstructive pulmonary disease) ([place])   . History of asthma 1956-2000  . Hypothyroid 2013  . Osteopenia   . Primary HSV infection of mouth 2008  . Pulmonary nodule   . 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 [employer] STUDY N/A 08/13/2019   Procedure: 24 HOUR PH STUDY- impedence;  Surgeon: Irving Copas., MD;  Location: [facility];  Service: Gastroenterology;  Laterality: N/A;  . COLONOSCOPY    . ESOPHAGEAL MANOMETRY N/A 08/13/2019   Procedure: ESOPHAGEAL MANOMETRY (EM);  Surgeon: Irving Copas., MD;  Location: [facility];  Service: Gastroenterology;  Laterality: N/A;  . [place] IMPEDANCE STUDY  08/13/2019   Procedure: [place] IMPEDANCE STUDY;  Surgeon: Rush Landmark Telford Nab., MD;  Location: [facility];  Service: Gastroenterology;;  . polyp on vocal chord     . TONSILLECTOMY  1958    There were no vitals  filed for this visit.   Subjective Assessment - 01/14/20 1148    Subjective Pt reports she overall feels "not bad".  played pickle ball 2x. The main thing today is pain/soreness right on the lateral hip. Pt is rubbing the greater trochanter on the Rt side.  Pt has had some trouble breathing and that has been the bigger problem    Pertinent History osteoporosis, COPD, esophogeal spasms (can cause chest pains), vertigo positional - cannot lie down    Currently in Pain? No/denies    Pain Score 3     Pain Location Hip    Pain Orientation Right;Lateral    Pain Descriptors / Indicators Aching    Pain Type Acute pain    Pain Onset More than a month ago    Multiple Pain Sites No                             [facility] Adult PT Treatment/Exercise - 01/14/20 0001      Lumbar Exercises: Stretches   Other Lumbar Stretch Exercise hip flexor stretch at step      Lumbar Exercises: Standing   Other Standing Lumbar Exercises side step with band 10x      Manual Therapy   Soft tissue mobilization cervical paraspinals and suboccipitals; Rt gluteus med and TFL and hip flexors  Myofascial Release pec major/minor bilateral Rt>Lt; ribcage rotation Rib 1-6; T1 into upper trap; bicep/tricep Rt side only                    PT Short Term Goals - 12/09/19 1615      PT SHORT TERM GOAL #1   Title Pt will be able to walk 25 min x 3 days/week without increased pain    Baseline did >25 min x  2 days this week    Status Partially Met      PT SHORT TERM GOAL #2   Title Pt will be ind with initial HEP    Status Achieved             PT Long Term Goals - 11/17/19 1524      PT LONG TERM GOAL #1   Title pt will be ind with advanced HEP    Time 12    Period Weeks    Status New    Target Date 02/09/20      PT LONG TERM GOAL #2   Title Pt will be able to walk 45 minutes 3x/week without increased pain in the hip    Time 12    Period Weeks    Status New    Target Date 02/09/20       PT LONG TERM GOAL #3   Title FOTO < or = to 37% limited    Baseline 56%    Time 12    Period Weeks    Status New    Target Date 02/09/20      PT LONG TERM GOAL #4   Title pt demonstrates ability to perform squat with weight evenly placed on BLE    Time 12    Period Weeks    Status New    Target Date 02/09/20      PT LONG TERM GOAL #5   Title Pt will have hip flexion, extension, and abduction improved to 5/5 so she can begin to work on return to [employer].    Time 12    Period Weeks    Status New    Target Date 02/09/20                 Plan - 01/14/20 1404    Clinical Impression Statement Pt did well with additional exercises.  She did experience increased tenderness of the greater trochanter with gluteal strengtening exercises . Pt appears to have classic symptoms of tendinitis in the region of glute med.  Pt is still have difficulty breathing during exercise which is limiting her full return to pickleball and ability to do the hip exercises so continue to work on thoracic mob today.  Continue to address trunk, core, hip weakness and mobilty for maximum return to activities.    PT Treatment/Interventions ADLs/Self Care Home Management;Biofeedback;Cryotherapy;Electrical Stimulation;Moist Heat;Therapeutic activities;Therapeutic exercise;Neuromuscular re-education;Patient/family education;Manual techniques;Passive range of motion;Taping;Dry needling;Iontophoresis 107m/ml Dexamethasone;Ultrasound;Gait training    PT Next Visit Plan fascial release abdomen, TFL, and upper thoracic for improved mobility    PT Home Exercise Plan Access Code: [email or web address]    Consulted and Agree with Plan of Care Patient           Patient will benefit from skilled therapeutic intervention in order to improve the following deficits and impairments:  Increased fascial restricitons,Decreased range of motion,Decreased strength,Postural dysfunction,Pain,Difficulty walking,Abnormal gait,Increased muscle  spasms  Visit Diagnosis: Pain in right hip  Difficulty in walking, not elsewhere classified  Other muscle spasm  Cervicalgia     Problem List Patient Active Problem List   Diagnosis Date Noted  . Pain in right buttock 10/21/2019  . Belching   . Upper back pain on left side 07/15/2019  . Hiatal hernia 06/15/2019  . Functional heartburn 06/15/2019  . Hypersensitivity 06/15/2019  . Chest wall discomfort 07/22/2018  . Pyrosis 04/26/2018  . Positive colorectal cancer screening using Cologuard test 04/26/2018  . Gastric ulcer without hemorrhage or perforation 04/26/2018  . Abnormal findings on esophagogastroduodenoscopy (EGD) 04/26/2018  . Atypical chest pain 02/26/2018  . Asthma-COPD overlap syndrome ([place]) 01/31/2018  . Chronic neck pain 08/27/2017  . GERD (gastroesophageal reflux disease) 04/20/2016  . Numbness 08/06/2015  . Osteopenia 12/10/2014  . Allergic urticaria 12/10/2014  . Pressure in head 04/01/2014  . Vertigo 02/10/2014  . Elevated blood pressure reading 11/06/2013  . Hypothyroid   . Primary HSV infection of mouth     Jule Ser, PT 01/14/2020, 5:07 PM  [facility] [address] [place], [place], [address] Phone: [phone]   Fax:  [phone]  Name: Tina Lucas MRN: [id number] Date of Birth: [birth year]

## 2020-01-21 ENCOUNTER — Encounter: Payer: Medicare PPO | Admitting: Physical Therapy

## 2020-01-22 NOTE — Progress Notes (Signed)
Subjective: Tina Lucas is a 69 y.o. female patient seen today painful porokeratotic lesion(s) right foot and painful mycotic toenails that limit ambulation. Painful toenails interfere with ambulation. Aggravating factors include wearing enclosed shoe gear. Pain is relieved with periodic professional debridement. Painful porokeratotic lesions are aggravated when weightbearing with and without shoegear. Pain is relieved with periodic professional debridement.   She voices no new pedal concerns on today's visit.  Patient Active Problem List   Diagnosis Date Noted  . Pain in right buttock 10/21/2019  . Belching   . Upper back pain on left side 07/15/2019  . Hiatal hernia 06/15/2019  . Functional heartburn 06/15/2019  . Hypersensitivity 06/15/2019  . Chest wall discomfort 07/22/2018  . Pyrosis 04/26/2018  . Positive colorectal cancer screening using Cologuard test 04/26/2018  . Gastric ulcer without hemorrhage or perforation 04/26/2018  . Abnormal findings on esophagogastroduodenoscopy (EGD) 04/26/2018  . Atypical chest pain 02/26/2018  . Asthma-COPD overlap syndrome ([place]) 01/31/2018  . Chronic neck pain 08/27/2017  . GERD (gastroesophageal reflux disease) 04/20/2016  . Numbness 08/06/2015  . Osteopenia 12/10/2014  . Allergic urticaria 12/10/2014  . Pressure in head 04/01/2014  . Vertigo 02/10/2014  . Elevated blood pressure reading 11/06/2013  . Hypothyroid   . Primary HSV infection of mouth     Current Outpatient Medications on File Prior to Visit  Medication Sig Dispense Refill  . Calcium Carbonate-Vitamin D 600-400 MG-UNIT tablet Take 1 tablet by mouth daily.    . celecoxib (CELEBREX) 100 MG capsule TAKE ONE CAPSULE BY MOUTH TWICE A DAY (Patient taking differently: Take 100 mg by mouth daily.) 180 capsule 3  . cholecalciferol (VITAMIN D) 400 UNITS TABS tablet Take 400 Units by mouth.    . diclofenac Sodium (VOLTAREN) 1 % GEL     . fexofenadine (ALLEGRA) 180 MG tablet Take 180 mg  by mouth daily.    . fluocinonide cream (LIDEX) [id number] % Apply 1 application topically 2 (two) times daily as needed (FOR PSORIASIS).     . hydrocortisone 2.5 % cream     . levalbuterol (XOPENEX HFA) 45 MCG/ACT inhaler INHALE ONE TO TWO PUFFS BY MOUTH EVERY 4 HOURS AS NEEDED FOR WHEEZING 15 g 2  . levothyroxine (SYNTHROID) 50 MCG tablet Take 1 tablet (50 mcg total) by mouth daily with breakfast. 90 tablet 3  . Magnesium 250 MG TABS Take 125 mg by mouth daily.    . mometasone-formoterol (DULERA) 200-5 MCG/ACT AERO Inhale 2 puffs into the lungs 2 (two) times daily. Appt needed for future refills. 13 g 11  . Multiple Vitamins-Minerals (ONE-A-DAY WOMENS 50+ ADVANTAGE PO) Take by mouth daily.    Marland Kitchen omeprazole (PRILOSEC) 40 MG capsule Take 1 capsule (40 mg total) by mouth 2 (two) times daily before a meal. (Patient taking differently: Take 40 mg by mouth daily.) 180 capsule 1  . prednisoLONE acetate (PRED FORTE) 1 % ophthalmic suspension Place 1 drop into the right eye daily. Pt takes 2 times per week    . triamcinolone (KENALOG) 0.1 % paste      No current facility-administered medications on file prior to visit.    Allergies  Allergen Reactions  . Singulair [Montelukast Sodium] Other (See Comments)    headaches    Objective: Physical Exam General: Tina Lucas is a pleasant 69 y.o.  Caucasian female, WD, WN in NAD. AAO x 3.   Vascular:  Capillary refill time to digits immediate b/l. Palpable DP pulses b/l. Palpable PT pulses b/l. Pedal hair present b/l.  Skin temperature gradient within normal limits b/l. No edema noted b/l.  Dermatological:  Pedal skin with normal turgor, texture and tone bilaterally. No open wounds bilaterally. No interdigital macerations bilaterally. Toenails 1-5 b/l elongated, discolored, dystrophic, thickened, crumbly with subungual debris and tenderness to dorsal palpation. Porokeratotic lesion(s) R hallux and submet head 5 right foot. No erythema, no edema, no drainage, no  fluctuance.  Musculoskeletal:  Normal muscle strength 5/5 to all lower extremity muscle groups bilaterally. No gross bony deformities bilaterally. No pain crepitus or joint limitation noted with ROM b/l.  Neurological:  Protective sensation intact 5/5 intact bilaterally with 10g monofilament b/l. Vibratory sensation intact b/l.  Assessment and Plan:  1. Pain due to onychomycosis of toenails of both feet   2. Porokeratosis   3. Pain in right toe(s)    -Examined patient. -No new findings. No new orders. -Toenails 1-5 b/l were debrided in length and girth with sterile nail nippers and dremel without iatrogenic bleeding.  -Corn(s) R hallux and submet head 5 right foot pared utilizing sterile scalpel blade without complication or incident. Total number debrided=2. -Patient to report any pedal injuries to medical professional immediately. -Patient advised to purchase reusable non-medicated U-shaped gel callus cushion at [email or web address] or [employer].  Return in about 3 months (around 04/13/2020) for nails w/corn(s)/callus(es)/porokeratotic lesion(s).  Freddie Breech, DPM

## 2020-01-26 ENCOUNTER — Ambulatory Visit: Payer: Medicare PPO | Admitting: Physical Therapy

## 2020-01-30 ENCOUNTER — Ambulatory Visit (INDEPENDENT_AMBULATORY_CARE_PROVIDER_SITE_OTHER)
Admission: RE | Admit: 2020-01-30 | Discharge: 2020-01-30 | Disposition: A | Discharge status: Home / Self Care | Payer: Medicare PPO | Source: Ambulatory Visit | Attending: Pulmonary Disease | Admitting: Pulmonary Disease

## 2020-01-30 ENCOUNTER — Other Ambulatory Visit: Payer: Self-pay

## 2020-01-30 DIAGNOSIS — R918 Other nonspecific abnormal finding of lung field: Secondary | ICD-10-CM

## 2020-01-30 DIAGNOSIS — J449 Chronic obstructive pulmonary disease, unspecified: Secondary | ICD-10-CM | POA: Diagnosis not present

## 2020-01-30 DIAGNOSIS — R911 Solitary pulmonary nodule: Secondary | ICD-10-CM

## 2020-01-30 DIAGNOSIS — R0602 Shortness of breath: Secondary | ICD-10-CM | POA: Diagnosis not present

## 2020-02-02 ENCOUNTER — Encounter: Payer: Self-pay | Admitting: Physical Therapy

## 2020-02-02 ENCOUNTER — Ambulatory Visit: Payer: Medicare PPO | Attending: Family Medicine | Admitting: Physical Therapy

## 2020-02-02 ENCOUNTER — Other Ambulatory Visit: Payer: Self-pay

## 2020-02-02 DIAGNOSIS — M542 Cervicalgia: Secondary | ICD-10-CM | POA: Diagnosis not present

## 2020-02-02 DIAGNOSIS — R262 Difficulty in walking, not elsewhere classified: Secondary | ICD-10-CM | POA: Diagnosis not present

## 2020-02-02 DIAGNOSIS — M62838 Other muscle spasm: Secondary | ICD-10-CM | POA: Insufficient documentation

## 2020-02-02 DIAGNOSIS — M25551 Pain in right hip: Secondary | ICD-10-CM | POA: Insufficient documentation

## 2020-02-02 NOTE — Therapy (Addendum)
[facility] [address], [place], [place], [address] Phone: [phone]   Fax:  [phone]  Physical Therapy Treatment  Patient Details  Name: Tina Lucas MRN: [id number] Date of Birth: June 24, 1950 Referring Provider (PT): Eunice Blase, MD   Encounter Date: 02/02/2020   PT End of Session - 02/02/20 1557    Visit Number 9    Number of Visits 13    Date for PT Re-Evaluation 02/09/20    Authorization Type humana medicare    Authorization - Visit Number 9    Authorization - Number of Visits 13    PT Start Time 3557    PT Stop Time 1315    PT Time Calculation (min) 42 min    Activity Tolerance Patient tolerated treatment well    Behavior During Therapy [facility] for tasks assessed/performed           Past Medical History:  Diagnosis Date  . Allergic rhinitis   . Allergy   . Asthma    as a child  . 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 COPD (chronic obstructive pulmonary disease) ([place])   . History of asthma 1956-2000  . Hypothyroid 2013  . Osteopenia   . Primary HSV infection of mouth 2008  . Pulmonary nodule   . 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 [place] STUDY N/A 08/13/2019   Procedure: 24 HOUR PH STUDY- impedence;  Surgeon: Irving Copas., MD;  [facility]: [facility];  Service: Gastroenterology;  Laterality: N/A;  . COLONOSCOPY    . ESOPHAGEAL MANOMETRY N/A 08/13/2019   Procedure: ESOPHAGEAL MANOMETRY (EM);  Surgeon: Irving Copas., MD;  [facility]: [facility];  Service: Gastroenterology;  Laterality: N/A;  . [place] IMPEDANCE STUDY  08/13/2019   Procedure: [place] IMPEDANCE STUDY;  Surgeon: Rush Landmark Telford Nab., MD;  Location: [facility];  Service: Gastroenterology;;  . polyp on vocal chord     . TONSILLECTOMY  1958    There were no vitals  filed for this visit.   Subjective Assessment - 02/02/20 1236    Subjective I have been to the gym today.  Overall, I have had a time with my lungs and since playing pickle ball I am winded every day.  I played 5 games of pickle ball and the lungs are the most frustrating part.  Since November I am having more lung issues. chest and back sore.    Pertinent History osteoporosis, COPD, esophogeal spasms (can cause chest pains), vertigo positional - cannot lie down    Currently in Pain? Yes    Pain Score 3     Pain Location Chest    Pain Orientation Upper;Medial;Mid    Pain Descriptors / Indicators Aching    Pain Type Acute pain    Pain Onset 1 to 4 weeks ago    Pain Frequency Intermittent    Aggravating Factors  exertion    Multiple Pain Sites No                             [facility] Adult PT Treatment/Exercise - 02/02/20 0001      Neuro Re-ed    Neuro Re-ed Details  breathing with pursed lip and exhaling slower than inhale      Manual Therapy   Soft tissue mobilization cervical paraspinals and suboccipitals;  thoracic paraspinals; pecs    Myofascial Release pec major/minor bilateral Rt>Lt; ribcage rotation Rib 1-6; T1 into upper trap                    PT Short Term Goals - 12/09/19 1615      PT SHORT TERM GOAL #1   Title Pt will be able to walk 25 min x 3 days/week without increased pain    Baseline did >25 min x  2 days this week    Status Partially Met      PT SHORT TERM GOAL #2   Title Pt will be ind with initial HEP    Status Achieved             PT Long Term Goals - 02/02/20 1554      PT LONG TERM GOAL #1   Title pt will be ind with advanced HEP    Status On-going      PT LONG TERM GOAL #2   Title Pt will be able to walk 45 minutes 3x/week without increased pain in the hip    Baseline has been able to walk, sometimes not as long and not consistent with 3x/week    Status Partially Met      PT LONG TERM GOAL #3   Title FOTO < or = to  37% limited    Status On-going      PT LONG TERM GOAL #4   Title pt demonstrates ability to perform squat with weight evenly placed on BLE    Baseline better    Status On-going      PT LONG TERM GOAL #5   Title Pt will have hip flexion, extension, and abduction improved to 5/5 so she can begin to work on return to pickle ball.    Status On-going                 Plan - 02/02/20 1557    Clinical Impression Statement Pt had more tension in pecs and upper thoracic today possibly due to her increased activity in playing pickle ball.  Pt c/o cramps and was educated in hydration and making sure she is getting adequate water and electrolytes now that she is progressing with her strength training.  Pt felt better after today's treatment.  Pt will benefit from skilled PT to continue to work on pain management while she is building strength to return to full activity level as well as monitored exercises to ensure she is not increasing risk for injuries from sport.    PT Treatment/Interventions ADLs/Self Care Home Management;Biofeedback;Cryotherapy;Electrical Stimulation;Moist Heat;Therapeutic activities;Therapeutic exercise;Neuromuscular re-education;Patient/family education;Manual techniques;Passive range of motion;Taping;Dry needling;Iontophoresis 83m/ml Dexamethasone;Ultrasound;Gait training    PT Next Visit Plan f/u on chest and upper back and breathing techniques; hydration with electrolytes    PT Home Exercise Plan Access Code: [email or web address]    Consulted and Agree with Plan of Care Patient           Patient will benefit from skilled therapeutic intervention in order to improve the following deficits and impairments:  Increased fascial restricitons,Decreased range of motion,Decreased strength,Postural dysfunction,Pain,Difficulty walking,Abnormal gait,Increased muscle spasms  Visit Diagnosis: Pain in right hip  Difficulty in walking, not elsewhere classified  Other muscle  spasm  Cervicalgia     Problem List Patient Active Problem List   Diagnosis Date Noted  . Pain in right buttock 10/21/2019  . Belching   . Upper back pain on left side 07/15/2019  . Hiatal hernia 06/15/2019  .  Functional heartburn 06/15/2019  . Hypersensitivity 06/15/2019  . Chest wall discomfort 07/22/2018  . Pyrosis 04/26/2018  . Positive colorectal cancer screening using Cologuard test 04/26/2018  . Gastric ulcer without hemorrhage or perforation 04/26/2018  . Abnormal findings on esophagogastroduodenoscopy (EGD) 04/26/2018  . Atypical chest pain 02/26/2018  . Asthma-COPD overlap syndrome ([place]) 01/31/2018  . Chronic neck pain 08/27/2017  . GERD (gastroesophageal reflux disease) 04/20/2016  . Numbness 08/06/2015  . Osteopenia 12/10/2014  . Allergic urticaria 12/10/2014  . Pressure in head 04/01/2014  . Vertigo 02/10/2014  . Elevated blood pressure reading 11/06/2013  . Hypothyroid   . Primary HSV infection of mouth     Jule Ser, PT 02/02/2020, 4:06 PM  [facility] [address], [place], [place], [address] Phone: [phone]   Fax:  [phone]  Name: Tina Lucas MRN: [id number] Date of Birth: 10-Dec-1950  PHYSICAL THERAPY DISCHARGE SUMMARY  Visits from Start of Care: 9  Current functional level related to goals / functional outcomes: See above goals   Remaining deficits: See above   Education / Equipment: HEP  Plan: Patient agrees to discharge.  Patient goals were partially met. Patient is being discharged due to being pleased with the current functional level.  ?????     Pt called and is okay to d/c - did not come to last scheduled visit due to weather. She is now dealing with reflux issue - she was able to return to activities still building up to normal volume of activities  [employer], PT 02/10/20 3:26 PM

## 2020-02-05 NOTE — Progress Notes (Signed)
Subjective:    Patient ID: Tina Lucas, female    DOB: 1951/01/06, 70 y.o.   MRN: [id number]  HPI The patient is here for an acute visit.   Started having esophageal issues in october'ish.   She was taking omeprazole 40 mg BID and was doing well up until mid-end September.  She had no GERD.  She decreased this to once at day at 5:30pm in hopes of getting off some of the medication.  She did fine initially, but after a few weeks started having GERD symptoms.    She can have it during the day or wake up and have it  She is having gerd 3-5 times a day.   Takes tums at night     Medications and allergies reviewed with patient and updated if appropriate.  Patient Active Problem List   Diagnosis Date Noted  . Pain in right buttock 10/21/2019  . Belching   . Upper back pain on left side 07/15/2019  . Hiatal hernia 06/15/2019  . Functional heartburn 06/15/2019  . Hypersensitivity 06/15/2019  . Chest wall discomfort 07/22/2018  . Pyrosis 04/26/2018  . Positive colorectal cancer screening using Cologuard test 04/26/2018  . Gastric ulcer without hemorrhage or perforation 04/26/2018  . Abnormal findings on esophagogastroduodenoscopy (EGD) 04/26/2018  . Atypical chest pain 02/26/2018  . Asthma-COPD overlap syndrome ([place]) 01/31/2018  . Chronic neck pain 08/27/2017  . GERD (gastroesophageal reflux disease) 04/20/2016  . Numbness 08/06/2015  . Osteopenia 12/10/2014  . Allergic urticaria 12/10/2014  . Pressure in head 04/01/2014  . Vertigo 02/10/2014  . Elevated blood pressure reading 11/06/2013  . Hypothyroid   . Primary HSV infection of mouth     Current Outpatient Medications on File Prior to Visit  Medication Sig Dispense Refill  . Calcium Carbonate-Vitamin D 600-400 MG-UNIT tablet Take 1 tablet by mouth daily.    . celecoxib (CELEBREX) 100 MG capsule TAKE ONE CAPSULE BY MOUTH TWICE A DAY (Patient taking differently: Take 100 mg by mouth daily.) 180 capsule 3  .  cholecalciferol (VITAMIN D) 400 UNITS TABS tablet Take 400 Units by mouth.    . diclofenac Sodium (VOLTAREN) 1 % GEL     . fexofenadine (ALLEGRA) 180 MG tablet Take 180 mg by mouth daily.    . fluocinonide cream (LIDEX) 4.09 % Apply 1 application topically 2 (two) times daily as needed (FOR PSORIASIS).     . hydrocortisone 2.5 % cream     . levalbuterol (XOPENEX HFA) 45 MCG/ACT inhaler INHALE ONE TO TWO PUFFS BY MOUTH EVERY 4 HOURS AS NEEDED FOR WHEEZING 15 g 2  . levothyroxine (SYNTHROID) 50 MCG tablet Take 1 tablet (50 mcg total) by mouth daily with breakfast. 90 tablet 3  . Magnesium 250 MG TABS Take 125 mg by mouth daily.    . mometasone-formoterol (DULERA) 200-5 MCG/ACT AERO Inhale 2 puffs into the lungs 2 (two) times daily. Appt needed for future refills. 13 g 11  . Multiple Vitamins-Minerals (ONE-A-DAY WOMENS 50+ ADVANTAGE PO) Take by mouth daily.    Marland Kitchen omeprazole (PRILOSEC) 40 MG capsule Take 1 capsule (40 mg total) by mouth 2 (two) times daily before a meal. (Patient taking differently: Take 40 mg by mouth daily.) 180 capsule 1  . prednisoLONE acetate (PRED FORTE) 1 % ophthalmic suspension Place 1 drop into the right eye daily. Pt takes 2 times per week    . triamcinolone (KENALOG) 0.1 % paste      No current facility-administered medications on  file prior to visit.    Past Medical History:  Diagnosis Date  . Allergic rhinitis   . Allergy   . Asthma    as a child  . 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 COPD (chronic obstructive pulmonary disease) ([place])   . History of asthma 1956-2000  . Hypothyroid 2013  . Osteopenia   . Primary HSV infection of mouth 2008  . Pulmonary nodule   . 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 [place] STUDY N/A 08/13/2019   Procedure: 24 HOUR PH STUDY- impedence;  Surgeon: Irving Copas., MD;   Location: [facility];  Service: Gastroenterology;  Laterality: N/A;  . COLONOSCOPY    . ESOPHAGEAL MANOMETRY N/A 08/13/2019   Procedure: ESOPHAGEAL MANOMETRY (EM);  Surgeon: Irving Copas., MD;  Location: [facility];  Service: Gastroenterology;  Laterality: N/A;  . [place] IMPEDANCE STUDY  08/13/2019   Procedure: [place] IMPEDANCE STUDY;  Surgeon: Rush Landmark Telford Nab., MD;  Location: [facility];  Service: Gastroenterology;;  . polyp on vocal chord     . TONSILLECTOMY  1958    Social History   Socioeconomic History  . Marital status: Married    Spouse name: Not on file  . Number of children: Not on file  . Years of education: Not on file  . Highest education level: Not on file  Occupational History  . Not on file  Tobacco Use  . Smoking status: Never Smoker  . Smokeless tobacco: Never Used  Vaping Use  . Vaping Use: Never used  Substance and Sexual Activity  . Alcohol use: Not Currently    Alcohol/week: 0.0 standard drinks  . Drug use: No  . Sexual activity: Not on file  Other Topics Concern  . Not on file  Social History Narrative   Lives with husband in a one story home.  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   Social Determinants of Health   Financial Resource Strain: Not on file  Food Insecurity: Not on file  Transportation Needs: Not on file  Physical Activity: Not on file  Stress: Not on file  Social Connections: Not on file    Family History  Problem Relation Age of Onset  . Breast cancer Mother 33  . Osteoarthritis Father   . Hypertension Father   . Leukemia Maternal Aunt 42  . Atrial fibrillation Brother   . Colon cancer Neg Hx   . Esophageal cancer Neg Hx   . Inflammatory bowel disease Neg Hx   . Liver disease Neg Hx   . Pancreatic cancer Neg Hx   . Rectal cancer Neg Hx   . Stomach cancer Neg Hx     Review of Systems  Constitutional: Negative for fever.  Respiratory: Positive for shortness of breath (from COPD). Negative for  cough and wheezing.   Cardiovascular: Negative for chest pain (discomfort).  Gastrointestinal:       GERD       Objective:   Vitals:   02/06/20 1503  BP: (!) 150/80  Pulse: 82  Temp: 98.1 F (36.7 C)  SpO2: 99%   BP Readings from Last 3 Encounters:  02/06/20 (!) 150/80  12/15/19 (!) 158/82  10/21/19 (!) 142/70   Wt Readings from Last 3 Encounters:  02/06/20 123 lb 12.8 oz (56.2 kg)  12/15/19 120 lb 6 oz (54.6 kg)  10/21/19 119 lb 12.8 oz (54.3 kg)   Body mass index is 19.98 kg/m.   Physical Exam    Constitutional: Appears well-developed and well-nourished. No distress.  Head: Normocephalic and atraumatic.  Cardiovascular: Normal rate, regular rhythm and normal heart sounds.  No murmur heard. No carotid bruit .  No edema Pulmonary/Chest: Effort normal and breath sounds normal. No respiratory distress. No has no wheezes. No rales.  Skin: Skin is warm and dry. Not diaphoretic.        Assessment & Plan:    See Problem List for Assessment and Plan of chronic medical problems.    This visit occurred during the SARS-CoV-2 public health emergency.  Safety protocols were in place, including screening questions prior to the visit, additional usage of staff PPE, and extensive cleaning of exam room while observing appropriate contact time as indicated for disinfecting solutions.

## 2020-02-06 ENCOUNTER — Encounter: Payer: Self-pay | Admitting: Internal Medicine

## 2020-02-06 ENCOUNTER — Ambulatory Visit: Payer: Medicare PPO | Admitting: Internal Medicine

## 2020-02-06 ENCOUNTER — Other Ambulatory Visit: Payer: Self-pay

## 2020-02-06 VITALS — BP 150/80 | HR 82 | Temp 98.1°F | Ht 66.0 in | Wt 123.8 lb

## 2020-02-06 DIAGNOSIS — K219 Gastro-esophageal reflux disease without esophagitis: Secondary | ICD-10-CM | POA: Diagnosis not present

## 2020-02-06 MED ORDER — OMEPRAZOLE 40 MG PO CPDR: 40.0000 mg | DELAYED_RELEASE_CAPSULE | Freq: Two times a day (BID) | ORAL | 1 refills | Status: DC

## 2020-02-06 NOTE — Patient Instructions (Addendum)
Increase your prilosec to twice a day.      Follow up in march as scheduled.

## 2020-02-06 NOTE — Assessment & Plan Note (Signed)
Chronic Not controlled Increase prilosec back to BID AC F/u in March as scheduled - may be able to decrease to 40 mg BID AC alternating with 20mg  am, 40 mg PM but we will see

## 2020-02-09 ENCOUNTER — Encounter: Payer: Medicare PPO | Admitting: Physical Therapy

## 2020-02-16 ENCOUNTER — Ambulatory Visit: Payer: Medicare PPO | Admitting: Podiatry

## 2020-02-16 ENCOUNTER — Encounter: Payer: Self-pay | Admitting: Pulmonary Disease

## 2020-02-16 ENCOUNTER — Ambulatory Visit: Payer: Medicare PPO | Admitting: Pulmonary Disease

## 2020-02-16 ENCOUNTER — Other Ambulatory Visit: Payer: Self-pay

## 2020-02-16 VITALS — BP 130/80 | HR 92 | Temp 97.1°F | Ht 66.0 in | Wt 121.2 lb

## 2020-02-16 DIAGNOSIS — T17908A Unspecified foreign body in respiratory tract, part unspecified causing other injury, initial encounter: Secondary | ICD-10-CM

## 2020-02-16 DIAGNOSIS — R9389 Abnormal findings on diagnostic imaging of other specified body structures: Secondary | ICD-10-CM | POA: Diagnosis not present

## 2020-02-16 DIAGNOSIS — J449 Chronic obstructive pulmonary disease, unspecified: Secondary | ICD-10-CM | POA: Diagnosis not present

## 2020-02-16 NOTE — Progress Notes (Signed)
Subjective:   PATIENT ID: Tina Lucas GENDER: female DOB: January 17, 1951, MRN: [id number]   HPI  Chief Complaint  Patient presents with  . Follow-up    Pt stated that she is using her inhaler once daily only    Reason for Visit: Follow-up  [name] is a 70 year old female never smoker with childhood asthma (shots, nebulizers and inhalers) who presents for follow-up.  Since our last visit, she reports shortness of breath that restarted when she cut back on prilosec 40 mg BID. She had decreased her PPI after her GI work-up but noticed gradually worsening symptoms like belching, retrosternal burning. This is associated with shortness of breath and needing her Xopenex once a day. Denies cough or wheezing. She discussed with her PCP last week and is restarting her PPI. Otherwise she has been compliant with her Dulera. Still plays pickleball with her grandchildren. Symptoms are triggered by activity and heat. No cough or wheezing.  Asthma Control Test ACT Total Score  02/16/2020 12  12/15/2019 20  05/19/2019 12    Social History: Never smoker Retired Pharmacist, hospital MS degree in exercise physiology No exposures to COVID-19.  She lives in [place] near her grandchildren  Environmental exposures:  Significant second-hand (father) smoke exposure and noticeably improved after moving for college.   No known or significant exposures to chemicals, raw materials or metals  I have personally reviewed patient's past medical/family/social history/allergies/current medications.  Past Medical History:  Diagnosis Date  . Allergic rhinitis   . Allergy   . Asthma    as a child  . 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 COPD (chronic obstructive pulmonary disease) ([place])   . History of asthma 1956-2000  . Hypothyroid 2013  . Osteopenia   . Primary HSV infection of mouth 2008  . Pulmonary nodule   . Sunlight-induced angio-edema-urticaria 07/11/2011    unknown trigger, neg autoimmune workup    Outpatient Medications Prior to Visit  Medication Sig Dispense Refill  . Calcium Carbonate-Vitamin D 600-400 MG-UNIT tablet Take 1 tablet by mouth daily.    . celecoxib (CELEBREX) 100 MG capsule TAKE ONE CAPSULE BY MOUTH TWICE A DAY (Patient taking differently: Take 100 mg by mouth daily.) 180 capsule 3  . cholecalciferol (VITAMIN D) 400 UNITS TABS tablet Take 400 Units by mouth.    . diclofenac Sodium (VOLTAREN) 1 % GEL     . fexofenadine (ALLEGRA) 180 MG tablet Take 180 mg by mouth daily.    . fluocinonide cream (LIDEX) 1.95 % Apply 1 application topically 2 (two) times daily as needed (FOR PSORIASIS).     . hydrocortisone 2.5 % cream     . levalbuterol (XOPENEX HFA) 45 MCG/ACT inhaler INHALE ONE TO TWO PUFFS BY MOUTH EVERY 4 HOURS AS NEEDED FOR WHEEZING 15 g 2  . Magnesium 250 MG TABS Take 125 mg by mouth daily.    . mometasone-formoterol (DULERA) 200-5 MCG/ACT AERO Inhale 2 puffs into the lungs 2 (two) times daily. Appt needed for future refills. 13 g 11  . Multiple Vitamins-Minerals (ONE-A-DAY WOMENS 50+ ADVANTAGE PO) Take by mouth daily.    Marland Kitchen omeprazole (PRILOSEC) 40 MG capsule Take 1 capsule (40 mg total) by mouth 2 (two) times daily before a meal. 180 capsule 1  . prednisoLONE acetate (PRED FORTE) 1 % ophthalmic suspension Place 1 drop into the right eye daily. Pt takes 2 times per week    . triamcinolone (KENALOG) 0.1 %  paste     . levothyroxine (SYNTHROID) 50 MCG tablet Take 1 tablet (50 mcg total) by mouth daily with breakfast. (Patient not taking: Reported on 02/16/2020) 90 tablet 3   No facility-administered medications prior to visit.    Review of Systems  Constitutional: Negative for chills, diaphoresis, fever, malaise/fatigue and weight loss.  HENT: Negative for congestion.   Respiratory: Positive for shortness of breath. Negative for cough, hemoptysis, sputum production and wheezing.   Cardiovascular: Negative for chest pain,  palpitations and leg swelling.  Gastrointestinal: Positive for heartburn.    Objective:   Vitals:   02/16/20 0935  BP: 130/80  Pulse: 92  Temp: (!) 97.1 F (36.2 C)  TempSrc: Tympanic  SpO2: 98%  Weight: 121 lb 4 oz (55 kg)  Height: 5\' 6"  (1.676 m)   SpO2: 98 %   Physical Exam: General: Well-appearing, no acute distress HENT: [place], AT Eyes: EOMI, no scleral icterus Respiratory: Clear to auscultation bilaterally.  No crackles, wheezing or rales Cardiovascular: RRR, -M/R/G, no JVD Extremities:-Edema,-tenderness Neuro: AAO x4, CNII-XII grossly intact Skin: Intact, no rashes or bruising Psych: Normal mood, normal affect  Data Reviewed:  Imaging: CT Chest 08/01/18- RUL GGO nodule measured 16mm and solid nodule measured 20mm CT Chest 01/30/19 - RUL nodule increased to 34mm. Scattered lung nodules <21mm in left and right upper lobes. CT Chest 02/16/19 - Unchanged prior lung nodules. Interval solid 76mm RUL and multifocal ground glass nodular opacities. CT Chest 01/30/20 - Interval development of solid 55mm nodule in RUL with adjacent smaller nodes and ground glass nodule in RUL 11x24mm. Remaininig nodules unchanged.  PFT: 08/27/2018 FVC 2.78 (78 %) FEV1 1.76 (65 %) Ratio 63 TLC 117 % RV 175% RV/TLC 144% DLCO 107 % Interpretation: Moderate obstructive defect with air trapping.  No significant bronchodilator response.  Normal TLC.  Normal DLCO  Labs: CBC 08/06/18 9.5% Eos with 500 eos. Since 2015 absolute eos range from 400-600. CBC 03/31/2019 3.4% Eos with 200 absolute eos  Imaging, labs and test noted above have been reviewed independently by me.    Assessment & Plan:   Discussion: 70 year old female never smoker with childhood asthma who presents for follow-up of asthma-COPD overlap. Prior labs with eosinophilia ranging from 400-600 since 2015.   Moderate COPD/Asthma Overlap Syndrome - not controlled CONTINUE Dulera 200-5 mcg TWO puffs TWICE a day. THIS IS YOUR EVERY DAY  INHALER CONTINUE Xopenex inhaler as needed for chest tightness. OK to take before activity. RESUME your PPI as scheduled  Right upper lobe lung nodules <76mm Reviewed CT Chest in-clinic. Interval nodules including solid 46mm RUL and multifocal ground glass nodular opacities. This may be related to silent aspiration/reflux. She has had pH and and esophageal manometry which demonstrates well-controlled reflux on current medications but may have hypersensitive esophagus or functional heartburn. Doubt this acute infection but may be an atypical presentation of subacute /chronic infection. However I believe this is more consistent with aspiration.  --Obtain CT Chest without contrast in 6 months --If symptoms are persistent or CT worsening, consider bronchoscopy   Health Maintenance Immunization History  Administered Date(s) Administered  . Fluad Quad(high Dose 65+) 10/09/2018  . Influenza Split 10/30/2013  . Influenza, High Dose Seasonal PF 10/25/2017, 10/17/2019  . Influenza-Unspecified 10/19/2014, 10/19/2015, 10/27/2016  . PFIZER(Purple Top)SARS-COV-2 Vaccination 02/12/2019, 03/05/2019  . Pneumococcal Conjugate-13 11/28/2013  . Pneumococcal Polysaccharide-23 03/29/2018  . Zoster 02/18/2009   Orders Placed This Encounter  Procedures  . CT Chest Wo Contrast    Standing  Status:   Future    Standing Expiration Date:   02/15/2021    Order Specific Question:   Preferred imaging location?    Answer:   [facility]   No orders of the defined types were placed in this encounter.  Return in about 6 months (around 08/15/2020).   [place], MD [facility] 02/16/2020 9:42 AM  Office Number [phone]

## 2020-02-16 NOTE — Patient Instructions (Signed)
Moderate COPD/Asthma Overlap Syndrome - not controlled CONTINUE Dulera 200-5 mcg TWO puffs TWICE a day. THIS IS YOUR EVERY DAY INHALER CONTINUE Xopenex inhaler as needed for chest tightness. OK to take before activity. RESUME your PPI as scheduled  Right upper lobe lung nodules <83mm Reviewed CT Chest in-clinic. Interval nodules including solid 73mm RUL and multifocal ground glass nodular opacities. This may be related to silent aspiration/reflux. She has had pH and and esophageal manometry which demonstrates well-controlled reflux on current medications but may have hypersensitive esophagus or functional heartburn. Doubt this acute infection but may be an atypical presentation of subacute /chronic infection. However I believe this is more consistent with aspiration.  --Obtain CT Chest without contrast in 6 months --If symptoms are persistent or CT worsening, consider bronchoscopy  Follow-up in 6 months

## 2020-02-20 ENCOUNTER — Encounter: Payer: Self-pay | Admitting: Pulmonary Disease

## 2020-03-25 ENCOUNTER — Other Ambulatory Visit: Payer: Self-pay

## 2020-03-25 ENCOUNTER — Ambulatory Visit (INDEPENDENT_AMBULATORY_CARE_PROVIDER_SITE_OTHER): Payer: Medicare PPO

## 2020-03-25 VITALS — BP 128/78 | HR 61 | Temp 97.6°F | Ht 66.0 in | Wt 119.6 lb

## 2020-03-25 DIAGNOSIS — Z0000 Encounter for general adult medical examination without abnormal findings: Secondary | ICD-10-CM | POA: Diagnosis not present

## 2020-03-25 NOTE — Progress Notes (Signed)
Subjective:   Tina Lucas is a 70 y.o. female who presents for Medicare Annual (Subsequent) preventive examination.  Review of Systems    No ROS. Medicare Wellness Visit. Additional risk factors are reflected in social history. Cardiac Risk Factors include: advanced age (>68men, >85 women)     Objective:    Today's Vitals   03/25/20 1024 03/25/20 1025  BP: 128/78   Pulse: 61   Temp: 97.6 F (36.4 C)   SpO2: 98%   Weight: 119 lb 9.6 oz (54.3 kg)   Height: 5\' 6"  (1.676 m)   PainSc: 0-No pain 0-No pain   Body mass index is 19.3 kg/m.  Advanced Directives 03/25/2020 07/17/2019 09/13/2017 02/21/2017 04/21/2016 04/21/2016 04/20/2016  Does Patient Have a Medical Advance Directive? Yes Yes Yes Yes Yes - Yes  Type of Advance Directive [place];Living will [place];Living will [place];Living will - [place];Living will [place];Living will [place];Living will  Does patient want to make changes to medical advance directive? No - Patient declined - - - No - Patient declined - -  Copy of [place] in Chart? No - copy requested - No - copy requested - No - copy requested - -    Current Medications (verified) Outpatient Encounter Medications as of 03/25/2020  Medication Sig  . Calcium Carbonate-Vitamin D 600-400 MG-UNIT tablet Take 1 tablet by mouth daily.  . celecoxib (CELEBREX) 100 MG capsule TAKE ONE CAPSULE BY MOUTH TWICE A DAY (Patient taking differently: Take 100 mg by mouth daily.)  . cholecalciferol (VITAMIN D) 400 UNITS TABS tablet Take 400 Units by mouth.  . diclofenac Sodium (VOLTAREN) 1 % GEL   . fexofenadine (ALLEGRA) 180 MG tablet Take 180 mg by mouth daily.  . fluocinonide cream (LIDEX) 7.82 % Apply 1 application topically 2 (two) times daily as needed (FOR PSORIASIS).   . hydrocortisone 2.5 % cream   . levalbuterol (XOPENEX HFA) 45  MCG/ACT inhaler INHALE ONE TO TWO PUFFS BY MOUTH EVERY 4 HOURS AS NEEDED FOR WHEEZING  . Magnesium 250 MG TABS Take 125 mg by mouth daily.  . mometasone-formoterol (DULERA) 200-5 MCG/ACT AERO Inhale 2 puffs into the lungs 2 (two) times daily. Appt needed for future refills.  . Multiple Vitamins-Minerals (ONE-A-DAY WOMENS 50+ ADVANTAGE PO) Take by mouth daily.  Marland Kitchen omeprazole (PRILOSEC) 40 MG capsule Take 1 capsule (40 mg total) by mouth 2 (two) times daily before a meal.  . prednisoLONE acetate (PRED FORTE) 1 % ophthalmic suspension Place 1 drop into the right eye daily. Pt takes 2 times per week  . triamcinolone (KENALOG) 0.1 % paste    No facility-administered encounter medications on file as of 03/25/2020.    Allergies (verified) Singulair [montelukast sodium]   History: Past Medical History:  Diagnosis Date  . Allergic rhinitis   . Allergy   . Asthma    as a child  . 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 COPD (chronic obstructive pulmonary disease) ([place])   . History of asthma 1956-2000  . Hypothyroid 2013  . Osteopenia   . Primary HSV infection of mouth 2008  . Pulmonary nodule   . 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 [place] STUDY N/A 08/13/2019   Procedure: 24 HOUR PH STUDY- impedence;  Surgeon: Mansouraty,  Telford Nab., MD;  Location: [facility];  Service: Gastroenterology;  Laterality: N/A;  . COLONOSCOPY    . ESOPHAGEAL MANOMETRY N/A 08/13/2019   Procedure: ESOPHAGEAL MANOMETRY (EM);  Surgeon: Irving Copas., MD;  Location: [facility];  Service: Gastroenterology;  Laterality: N/A;  . [place] IMPEDANCE STUDY  08/13/2019   Procedure: [place] IMPEDANCE STUDY;  Surgeon: Rush Landmark Telford Nab., MD;  Location: [facility];  Service: Gastroenterology;;  . polyp on vocal chord     . TONSILLECTOMY  1958   Family History  Problem  Relation Age of Onset  . Breast cancer Mother 36  . Osteoarthritis Father   . Hypertension Father   . Leukemia Maternal Aunt 57  . Atrial fibrillation Brother   . Colon cancer Neg Hx   . Esophageal cancer Neg Hx   . Inflammatory bowel disease Neg Hx   . Liver disease Neg Hx   . Pancreatic cancer Neg Hx   . Rectal cancer Neg Hx   . Stomach cancer Neg Hx    Social History   Socioeconomic History  . Marital status: Married    Spouse name: Not on file  . Number of children: Not on file  . Years of education: Not on file  . Highest education level: Not on file  Occupational History  . Not on file  Tobacco Use  . Smoking status: Never Smoker  . Smokeless tobacco: Never Used  Vaping Use  . Vaping Use: Never used  Substance and Sexual Activity  . Alcohol use: Not Currently    Alcohol/week: 0.0 standard drinks  . Drug use: No  . Sexual activity: Not on file  Other Topics Concern  . Not on file  Social History Narrative   Lives with husband in a one story home.  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   Social Determinants of Health   Financial Resource Strain: Low Risk   . Difficulty of Paying Living Expenses: Not hard at all  Food Insecurity: No Food Insecurity  . Worried About Charity fundraiser in the Last Year: Never true  . Ran Out of Food in the Last Year: Never true  Transportation Needs: No Transportation Needs  . Lack of Transportation (Medical): No  . Lack of Transportation (Non-Medical): No  Physical Activity: Sufficiently Active  . Days of Exercise per Week: 7 days  . Minutes of Exercise per Session: 60 min  Stress: No Stress Concern Present  . Feeling of Stress : Not at all  Social Connections: Socially Integrated  . Frequency of Communication with Friends and Family: More than three times a week  . Frequency of Social Gatherings with Friends and Family: More than three times a week  . Attends Religious Services: More than 4 times per year  .  Active Member of Clubs or Organizations: Yes  . Attends Archivist Meetings: More than 4 times per year  . Marital Status: Married    Tobacco Counseling Counseling given: Not Answered   Clinical Intake:  Pre-visit preparation completed: Yes  Pain : No/denies pain Pain Score: 0-No pain     BMI - recorded: 19.3 Nutritional Risks: None Diabetes: No  How often do you need to have someone help you when you read instructions, pamphlets, or other written materials from your doctor or pharmacy?: 1 - Never What is the last grade level you completed in school?: Retired Education officer, museum  Diabetic? no  Interpreter Needed?: No  Information entered by :: [employer]  [place], LPN   Activities of Daily Living In your present state of health, do you have any difficulty performing the following activities: 03/25/2020  Hearing? N  Vision? N  Difficulty concentrating or making decisions? N  Walking or climbing stairs? N  Dressing or bathing? N  Doing errands, shopping? N  Preparing Food and eating ? N  Using the Toilet? N  In the past six months, have you accidently leaked urine? N  Do you have problems with loss of bowel control? N  Managing your Medications? N  Managing your Finances? N  Housekeeping or managing your Housekeeping? N  Some recent data might be hidden    Patient Care Team: Binnie Rail, MD as PCP - General (Internal Medicine) [place], Amy, MD (Dermatology)  Indicate any recent Medical Services you may have received from other than [facility] providers in the past year (date may be approximate).     Assessment:   This is a routine wellness examination for [place].  Hearing/Vision screen No exam data present  Dietary issues and exercise activities discussed: Current Exercise Habits: Home exercise routine;Structured exercise class, Type of exercise: walking;Other - see comments (10,000-15,000 steps per day, pickleball tournament, walks 3-4 miles per day and goes  to gym), Time (Minutes): 60, Frequency (Times/Week): 7, Weekly Exercise (Minutes/Week): 420, Intensity: Moderate, Exercise limited by: respiratory conditions(s)  Goals    . Client understands the importance of follow-up with providers by attending scheduled visits     To maintain my current health status by continuing to eat healthy, stay physically active and socially active.      Depression Screen PHQ 2/9 Scores 03/25/2020 03/31/2019 03/07/2017 02/02/2016  PHQ - 2 Score 0 0 0 0    Fall Risk Fall Risk  03/25/2020 03/31/2019 03/07/2017 02/02/2016  Falls in the past year? 0 0 No No  Number falls in past yr: 0 0 - -  Injury with Fall? 0 0 - -  Risk for fall due to : No Fall Risks - - -  Follow up Falls evaluation completed - - -    FALL RISK PREVENTION PERTAINING TO THE HOME:  Any stairs in or around the home? Yes  If so, are there any without handrails? No  Home free of loose throw rugs in walkways, pet beds, electrical cords, etc? Yes  Adequate lighting in your home to reduce risk of falls? Yes   ASSISTIVE DEVICES UTILIZED TO PREVENT FALLS:  Life alert? No  Use of a cane, walker or w/c? No  Grab bars in the bathroom? No  Shower chair or bench in shower? No  Elevated toilet seat or a handicapped toilet? No   TIMED UP AND GO:  Was the test performed? No .  Length of time to ambulate 10 feet: 0 sec.   Gait steady and fast without use of assistive device  Cognitive Function: Normal cognitive status assessed by direct observation by this Nurse Health Advisor. No abnormalities found.          Immunizations Immunization History  Administered Date(s) Administered  . Fluad Quad(high Dose 65+) 10/09/2018  . Influenza Split 10/30/2013  . Influenza, High Dose Seasonal PF 10/25/2017, 10/17/2019  . Influenza-Unspecified 10/19/2014, 10/19/2015, 10/27/2016  . PFIZER(Purple Top)SARS-COV-2 Vaccination 02/12/2019, 03/05/2019  . Pneumococcal Conjugate-13 11/28/2013  . Pneumococcal  Polysaccharide-23 03/29/2018  . Zoster 02/18/2009    TDAP status: Due, Education has been provided regarding the importance of this vaccine. Advised may receive this vaccine at local pharmacy or Health Dept.  Aware to provide a copy of the vaccination record if obtained from local pharmacy or Health Dept. Verbalized acceptance and understanding.  Flu Vaccine status: Up to date  Pneumococcal vaccine status: Up to date  Covid-19 vaccine status: Completed vaccines  Qualifies for Shingles Vaccine? Yes   Zostavax completed Yes   Shingrix Completed?: No.    Education has been provided regarding the importance of this vaccine. Patient has been advised to call insurance company to determine out of pocket expense if they have not yet received this vaccine. Advised may also receive vaccine at local pharmacy or Health Dept. Verbalized acceptance and understanding.  Screening Tests Health Maintenance  Topic Date Due  . COVID-19 Vaccine (3 - Booster for Pfizer series) 09/02/2019  . MAMMOGRAM  03/21/2020  . TETANUS/TDAP  03/30/2020 (Originally 07/11/1969)  . DEXA SCAN  03/28/2021  . COLONOSCOPY (Pts 45-81yrs Insurance coverage will need to be confirmed)  06/25/2028  . INFLUENZA VACCINE  Completed  . Hepatitis C Screening  Completed  . PNA vac Low Risk Adult  Completed  . HPV VACCINES  Aged Out    Health Maintenance  Health Maintenance Due  Topic Date Due  . COVID-19 Vaccine (3 - Booster for Pfizer series) 09/02/2019  . MAMMOGRAM  03/21/2020    Colorectal cancer screening: Type of screening: Colonoscopy. Completed 06/26/2018. Repeat every 10 years  Mammogram status: Completed 04/16/2019. Repeat every year  Bone Density status: Completed 03/29/2018. Results reflect: Bone density results: OSTEOPENIA. Repeat every 3 years.  Lung Cancer Screening: (Low Dose CT Chest recommended if Age 97-80 years, 30 pack-year currently smoking OR have quit w/in 15years.) does not qualify.   Lung Cancer Screening  Referral: no  Additional Screening:  Hepatitis C Screening: does qualify; Completed yes  Vision Screening: Recommended annual ophthalmology exams for early detection of glaucoma and other disorders of the eye. Is the patient up to date with their annual eye exam?  Yes  Who is the provider or what is the name of the office in which the patient attends annual eye exams? [facility] If pt is not established with a provider, would they like to be referred to a provider to establish care? No .   Dental Screening: Recommended annual dental exams for proper oral hygiene  Community Resource Referral / Chronic Care Management: CRR required this visit?  No   CCM required this visit?  No      Plan:     I have personally reviewed and noted the following in the patient's chart:   . Medical and social history . Use of alcohol, tobacco or illicit drugs  . Current medications and supplements . Functional ability and status . Nutritional status . Physical activity . Advanced directives . List of other physicians . Hospitalizations, surgeries, and ER visits in previous 12 months . Vitals . Screenings to include cognitive, depression, and falls . Referrals and appointments  In addition, I have reviewed and discussed with patient certain preventive protocols, quality metrics, and best practice recommendations. A written personalized care plan for preventive services as well as general preventive health recommendations were provided to patient.     Sheral Flow, LPN   09/30/9478   Nurse Notes:  Medications reviewed with patient; no opioid use noted.

## 2020-03-25 NOTE — Patient Instructions (Addendum)
[name] , Thank you for taking time to come for your Medicare Wellness Visit. I appreciate your ongoing commitment to your health goals. Please review the following plan we discussed and let me know if I can assist you in the future.   Screening recommendations/referrals: Colonoscopy: 06/26/2018; due every 10 years Mammogram: 04/16/2019; due every 1-2 years Bone Density: 03/29/2018; due every 3 years Recommended yearly ophthalmology/optometry visit for glaucoma screening and checkup Recommended yearly dental visit for hygiene and checkup  Vaccinations: Influenza vaccine: 10/17/2019 Pneumococcal vaccine: 11/28/2013, 03/29/2018 Tdap vaccine: declined Shingles vaccine: never done Covid-19: 02/12/2019, 03/05/2019, 10/30/2019  Advanced directives: Please bring a copy of your health care power of attorney and living will to the office at your convenience.  Conditions/risks identified: Yes; Reviewed health maintenance screenings with patient today and relevant education, vaccines, and/or referrals were provided. Please continue to do your personal lifestyle choices by: daily care of teeth and gums, regular physical activity (goal should be 5 days a week for 30 minutes), eat a healthy diet, avoid tobacco and drug use, limiting any alcohol intake, taking a low-dose aspirin (if not allergic or have been advised by your provider otherwise) and taking vitamins and minerals as recommended by your provider. Continue doing brain stimulating activities (puzzles, reading, adult coloring books, staying active) to keep memory sharp. Continue to eat heart healthy diet (full of fruits, vegetables, whole grains, lean protein, water--limit salt, fat, and sugar intake) and increase physical activity as tolerated.  Next appointment: Please schedule your next Medicare Wellness Visit with your Nurse Health Advisor in 1 year by calling [phone].  Preventive Care 76 Years and Older, Female Preventive care refers to lifestyle  choices and visits with your health care provider that can promote health and wellness. What does preventive care include?  A yearly physical exam. This is also called an annual well check.  Dental exams once or twice a year.  Routine eye exams. Ask your health care provider how often you should have your eyes checked.  Personal lifestyle choices, including:  Daily care of your teeth and gums.  Regular physical activity.  Eating a healthy diet.  Avoiding tobacco and drug use.  Limiting alcohol use.  Practicing safe sex.  Taking low-dose aspirin every day.  Taking vitamin and mineral supplements as recommended by your health care provider. What happens during an annual well check? The services and screenings done by your health care provider during your annual well check will depend on your age, overall health, lifestyle risk factors, and family history of disease. Counseling  Your health care provider may ask you questions about your:  Alcohol use.  Tobacco use.  Drug use.  Emotional well-being.  Home and relationship well-being.  Sexual activity.  Eating habits.  History of falls.  Memory and ability to understand (cognition).  Work and work Statistician.  Reproductive health. Screening  You may have the following tests or measurements:  Height, weight, and BMI.  Blood pressure.  Lipid and cholesterol levels. These may be checked every 5 years, or more frequently if you are over 89 years old.  Skin check.  Lung cancer screening. You may have this screening every year starting at age 10 if you have a 30-pack-year history of smoking and currently smoke or have quit within the past 15 years.  Fecal occult blood test (FOBT) of the stool. You may have this test every year starting at age 35.  Flexible sigmoidoscopy or colonoscopy. You may have a sigmoidoscopy every 5  years or a colonoscopy every 10 years starting at age 39.  Hepatitis C blood  test.  Hepatitis B blood test.  Sexually transmitted disease (STD) testing.  Diabetes screening. This is done by checking your blood sugar (glucose) after you have not eaten for a while (fasting). You may have this done every 1-3 years.  Bone density scan. This is done to screen for osteoporosis. You may have this done starting at age 18.  Mammogram. This may be done every 1-2 years. Talk to your health care provider about how often you should have regular mammograms. Talk with your health care provider about your test results, treatment options, and if necessary, the need for more tests. Vaccines  Your health care provider may recommend certain vaccines, such as:  Influenza vaccine. This is recommended every year.  Tetanus, diphtheria, and acellular pertussis (Tdap, Td) vaccine. You may need a Td booster every 10 years.  Zoster vaccine. You may need this after age 52.  Pneumococcal 13-valent conjugate (PCV13) vaccine. One dose is recommended after age 93.  Pneumococcal polysaccharide (PPSV23) vaccine. One dose is recommended after age 29. Talk to your health care provider about which screenings and vaccines you need and how often you need them. This information is not intended to replace advice given to you by your health care provider. Make sure you discuss any questions you have with your health care provider. Document Released: 02/05/2015 Document Revised: 09/29/2015 Document Reviewed: 11/10/2014 Elsevier Interactive Patient Education  2017 [place] Prevention in the Home Falls can cause injuries. They can happen to people of all ages. There are many things you can do to make your home safe and to help prevent falls. What can I do on the outside of my home?  Regularly fix the edges of walkways and driveways and fix any cracks.  Remove anything that might make you trip as you walk through a door, such as a raised step or threshold.  Trim any bushes or trees on the  path to your home.  Use bright outdoor lighting.  Clear any walking paths of anything that might make someone trip, such as rocks or tools.  Regularly check to see if handrails are loose or broken. Make sure that both sides of any steps have handrails.  Any raised decks and porches should have guardrails on the edges.  Have any leaves, snow, or ice cleared regularly.  Use sand or salt on walking paths during winter.  Clean up any spills in your garage right away. This includes oil or grease spills. What can I do in the bathroom?  Use night lights.  Install grab bars by the toilet and in the tub and shower. Do not use towel bars as grab bars.  Use non-skid mats or decals in the tub or shower.  If you need to sit down in the shower, use a plastic, non-slip stool.  Keep the floor dry. Clean up any water that spills on the floor as soon as it happens.  Remove soap buildup in the tub or shower regularly.  Attach bath mats securely with double-sided non-slip rug tape.  Do not have throw rugs and other things on the floor that can make you trip. What can I do in the bedroom?  Use night lights.  Make sure that you have a light by your bed that is easy to reach.  Do not use any sheets or blankets that are too big for your bed. They should not hang  down onto the floor.  Have a firm chair that has side arms. You can use this for support while you get dressed.  Do not have throw rugs and other things on the floor that can make you trip. What can I do in the kitchen?  Clean up any spills right away.  Avoid walking on wet floors.  Keep items that you use a lot in easy-to-reach places.  If you need to reach something above you, use a strong step stool that has a grab bar.  Keep electrical cords out of the way.  Do not use floor polish or wax that makes floors slippery. If you must use wax, use non-skid floor wax.  Do not have throw rugs and other things on the floor that can  make you trip. What can I do with my stairs?  Do not leave any items on the stairs.  Make sure that there are handrails on both sides of the stairs and use them. Fix handrails that are broken or loose. Make sure that handrails are as long as the stairways.  Check any carpeting to make sure that it is firmly attached to the stairs. Fix any carpet that is loose or worn.  Avoid having throw rugs at the top or bottom of the stairs. If you do have throw rugs, attach them to the floor with carpet tape.  Make sure that you have a light switch at the top of the stairs and the bottom of the stairs. If you do not have them, ask someone to add them for you. What else can I do to help prevent falls?  Wear shoes that:  Do not have high heels.  Have rubber bottoms.  Are comfortable and fit you well.  Are closed at the toe. Do not wear sandals.  If you use a stepladder:  Make sure that it is fully opened. Do not climb a closed stepladder.  Make sure that both sides of the stepladder are locked into place.  Ask someone to hold it for you, if possible.  Clearly mark and make sure that you can see:  Any grab bars or handrails.  First and last steps.  Where the edge of each step is.  Use tools that help you move around (mobility aids) if they are needed. These include:  Canes.  Walkers.  Scooters.  Crutches.  Turn on the lights when you go into a dark area. Replace any light bulbs as soon as they burn out.  Set up your furniture so you have a clear path. Avoid moving your furniture around.  If any of your floors are uneven, fix them.  If there are any pets around you, be aware of where they are.  Review your medicines with your doctor. Some medicines can make you feel dizzy. This can increase your chance of falling. Ask your doctor what other things that you can do to help prevent falls. This information is not intended to replace advice given to you by your health care  provider. Make sure you discuss any questions you have with your health care provider. Document Released: 11/05/2008 Document Revised: 06/17/2015 Document Reviewed: 02/13/2014 Elsevier Interactive Patient Education  2017 [employer].

## 2020-03-30 ENCOUNTER — Encounter: Payer: Self-pay | Admitting: Internal Medicine

## 2020-03-30 NOTE — Progress Notes (Signed)
Subjective:    Patient ID: Tina Lucas, female    DOB: 15-Nov-1950, 70 y.o.   MRN: [id number]   This visit occurred during the SARS-CoV-2 public health emergency.  Safety protocols were in place, including screening questions prior to the visit, additional usage of staff PPE, and extensive cleaning of exam room while observing appropriate contact time as indicated for disinfecting solutions.    HPI She is here for a physical exam.   GERD symptoms.  Despite her medication that she is taking religiously she continues to have GERD symptoms.  Thin skin, easy skin tears and bleeding.  Easy bruising.    BP well controlled at home - she checks it daily.  Oxygen is high 90's at home.     Medications and allergies reviewed with patient and updated if appropriate.  Patient Active Problem List   Diagnosis Date Noted  . Senile purpura ([place]) 03/31/2020  . Pain in right buttock 10/21/2019  . Belching   . Upper back pain on left side 07/15/2019  . Hiatal hernia 06/15/2019  . Functional heartburn 06/15/2019  . Chest wall discomfort 07/22/2018  . Pyrosis 04/26/2018  . Gastric ulcer without hemorrhage or perforation 04/26/2018  . Abnormal findings on esophagogastroduodenoscopy (EGD) 04/26/2018  . Atypical chest pain 02/26/2018  . Asthma-COPD overlap syndrome ([place]) 01/31/2018  . Chronic neck pain 08/27/2017  . GERD (gastroesophageal reflux disease) 04/20/2016  . Numbness 08/06/2015  . Osteopenia 12/10/2014  . Allergic urticaria 12/10/2014  . Pressure in head 04/01/2014  . Vertigo 02/10/2014  . Elevated blood pressure reading 11/06/2013  . Hypothyroid   . Primary HSV infection of mouth     Current Outpatient Medications on File Prior to Visit  Medication Sig Dispense Refill  . Calcium Carbonate-Vitamin D 600-400 MG-UNIT tablet Take 1 tablet by mouth daily.    . celecoxib (CELEBREX) 100 MG capsule TAKE ONE CAPSULE BY MOUTH TWICE A DAY (Patient taking differently: Take 100 mg by  mouth daily.) 180 capsule 3  . cholecalciferol (VITAMIN D) 400 UNITS TABS tablet Take 400 Units by mouth.    . diclofenac Sodium (VOLTAREN) 1 % GEL     . fexofenadine (ALLEGRA) 180 MG tablet Take 180 mg by mouth daily.    . fluocinonide cream (LIDEX) 4.28 % Apply 1 application topically 2 (two) times daily as needed (FOR PSORIASIS).     . hydrocortisone 2.5 % cream     . levalbuterol (XOPENEX HFA) 45 MCG/ACT inhaler INHALE ONE TO TWO PUFFS BY MOUTH EVERY 4 HOURS AS NEEDED FOR WHEEZING 15 g 2  . Magnesium 250 MG TABS Take 125 mg by mouth daily.    . mometasone-formoterol (DULERA) 200-5 MCG/ACT AERO Inhale 2 puffs into the lungs 2 (two) times daily. Appt needed for future refills. 13 g 11  . Multiple Vitamins-Minerals (ONE-A-DAY WOMENS 50+ ADVANTAGE PO) Take by mouth daily.    Marland Kitchen omeprazole (PRILOSEC) 40 MG capsule Take 1 capsule (40 mg total) by mouth 2 (two) times daily before a meal. 180 capsule 1  . prednisoLONE acetate (PRED FORTE) 1 % ophthalmic suspension Place 1 drop into the right eye daily. Pt takes 2 times per week    . triamcinolone (KENALOG) 0.1 % paste      No current facility-administered medications on file prior to visit.    Past Medical History:  Diagnosis Date  . Allergic rhinitis   . Allergy   . Asthma    as a child  . 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 COPD (chronic obstructive pulmonary disease) ([place])   . History of asthma 1956-2000  . Hypothyroid 2013  . Osteopenia   . Primary HSV infection of mouth 2008  . Pulmonary nodule   . 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 [place] STUDY N/A 08/13/2019   Procedure: 24 HOUR PH STUDY- impedence;  Surgeon: Irving Copas., MD;  Location: [facility];  Service: Gastroenterology;  Laterality: N/A;  . COLONOSCOPY    . ESOPHAGEAL MANOMETRY N/A 08/13/2019   Procedure:  ESOPHAGEAL MANOMETRY (EM);  Surgeon: Irving Copas., MD;  Location: [facility];  Service: Gastroenterology;  Laterality: N/A;  . [place] IMPEDANCE STUDY  08/13/2019   Procedure: [place] IMPEDANCE STUDY;  Surgeon: Rush Landmark Telford Nab., MD;  Location: [facility];  Service: Gastroenterology;;  . polyp on vocal chord     . TONSILLECTOMY  1958    Social History   Socioeconomic History  . Marital status: Married    Spouse name: Not on file  . Number of children: Not on file  . Years of education: Not on file  . Highest education level: Not on file  Occupational History  . Not on file  Tobacco Use  . Smoking status: Never Smoker  . Smokeless tobacco: Never Used  Vaping Use  . Vaping Use: Never used  Substance and Sexual Activity  . Alcohol use: Not Currently    Alcohol/week: 0.0 standard drinks  . Drug use: No  . Sexual activity: Not on file  Other Topics Concern  . Not on file  Social History Narrative   Lives with husband in a one story home.  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   Social Determinants of Health   Financial Resource Strain: Low Risk   . Difficulty of Paying Living Expenses: Not hard at all  Food Insecurity: No Food Insecurity  . Worried About Charity fundraiser in the Last Year: Never true  . Ran Out of Food in the Last Year: Never true  Transportation Needs: No Transportation Needs  . Lack of Transportation (Medical): No  . Lack of Transportation (Non-Medical): No  Physical Activity: Sufficiently Active  . Days of Exercise per Week: 7 days  . Minutes of Exercise per Session: 60 min  Stress: No Stress Concern Present  . Feeling of Stress : Not at all  Social Connections: Socially Integrated  . Frequency of Communication with Friends and Family: More than three times a week  . Frequency of Social Gatherings with Friends and Family: More than three times a week  . Attends Religious Services: More than 4 times per year  . Active Member  of Clubs or Organizations: Yes  . Attends Archivist Meetings: More than 4 times per year  . Marital Status: Married    Family History  Problem Relation Age of Onset  . Breast cancer Mother 42  . Osteoarthritis Father   . Hypertension Father   . Leukemia Maternal Aunt 30  . Atrial fibrillation Brother   . Colon cancer Neg Hx   . Esophageal cancer Neg Hx   . Inflammatory bowel disease Neg Hx   . Liver disease Neg Hx   . Pancreatic cancer Neg Hx   . Rectal cancer Neg Hx   . Stomach cancer Neg Hx     Review of Systems  Constitutional:  Negative for fever.  Eyes: Negative for visual disturbance.  Respiratory: Positive for shortness of breath. Negative for cough and wheezing.   Cardiovascular: Negative for chest pain, palpitations and leg swelling.  Gastrointestinal: Negative for abdominal pain, blood in stool, constipation and diarrhea.       GERD symptoms  Genitourinary: Negative for dysuria.  Musculoskeletal: Positive for arthralgias.  Skin: Negative for rash.  Neurological: Positive for dizziness (rare - none recently). Negative for light-headedness and headaches.  Hematological: Bruises/bleeds easily.  Psychiatric/Behavioral: Negative for dysphoric mood. The patient is nervous/anxious.        Objective:   Vitals:   03/31/20 0748 03/31/20 0855  BP: 140/70   Pulse: 60 68  Temp: 97.9 F (36.6 C)   SpO2: (!) 86% 97%   Filed Weights   03/31/20 0748  Weight: 118 lb (53.5 kg)   Body mass index is 19.05 kg/m.  BP Readings from Last 3 Encounters:  03/31/20 140/70  03/25/20 128/78  02/16/20 130/80    Wt Readings from Last 3 Encounters:  03/31/20 118 lb (53.5 kg)  03/25/20 119 lb 9.6 oz (54.3 kg)  02/16/20 121 lb 4 oz (55 kg)     Physical Exam Constitutional: She appears well-developed and well-nourished. No distress.  HENT:  Head: Normocephalic and atraumatic.  Right Ear: External ear normal. Normal ear canal and TM Left Ear: External ear normal.   Normal ear canal and TM Mouth/Throat: Oropharynx is clear and moist.  Eyes: Conjunctivae and EOM are normal.  Neck: Neck supple. No tracheal deviation present. No thyromegaly present.  No carotid bruit  Cardiovascular: Normal rate, regular rhythm and normal heart sounds.   No murmur heard.  No edema. Pulmonary/Chest: Effort normal and breath sounds normal. No respiratory distress. She has no wheezes. She has no rales.  Breast: deferred   Abdominal: Soft. She exhibits no distension. There is no tenderness.  Lymphadenopathy: She has no cervical adenopathy.  Skin: Skin is warm and dry. She is not diaphoretic.  Psychiatric: She has a normal mood and affect. Her behavior is normal.        Assessment & Plan:   Physical exam: Screening blood work    ordered Immunizations  Up to date Colonoscopy  Up to date  Mammogram  Up to date - march - [facility] Dexa  Up to date  Eye exams  Up to date  Exercise  Regular - gym, pickelball, walking Weight  normal Substance abuse  none      See Problem List for Assessment and Plan of chronic medical problems.

## 2020-03-30 NOTE — Patient Instructions (Addendum)
Blood work was ordered.     No immunization administered today.    Medications changes include :   Amitriptyline 10 mg at bedtime   Your prescription(s) have been submitted to your pharmacy.     Please followup in 1 year     Health Maintenance, Female Adopting a healthy lifestyle and getting preventive care are important in promoting health and wellness. Ask your health care provider about:  The right schedule for you to have regular tests and exams.  Things you can do on your own to prevent diseases and keep yourself healthy. What should I know about diet, weight, and exercise? Eat a healthy diet  Eat a diet that includes plenty of vegetables, fruits, low-fat dairy products, and lean protein.  Do not eat a lot of foods that are high in solid fats, added sugars, or sodium.   Maintain a healthy weight Body mass index (BMI) is used to identify weight problems. It estimates body fat based on height and weight. Your health care provider can help determine your BMI and help you achieve or maintain a healthy weight. Get regular exercise Get regular exercise. This is one of the most important things you can do for your health. Most adults should:  Exercise for at least 150 minutes each week. The exercise should increase your heart rate and make you sweat (moderate-intensity exercise).  Do strengthening exercises at least twice a week. This is in addition to the moderate-intensity exercise.  Spend less time sitting. Even light physical activity can be beneficial. Watch cholesterol and blood lipids Have your blood tested for lipids and cholesterol at 71 years of age, then have this test every 5 years. Have your cholesterol levels checked more often if:  Your lipid or cholesterol levels are high.  You are older than 71 years of age.  You are at high risk for heart disease. What should I know about cancer screening? Depending on your health history and family history, you may  need to have cancer screening at various ages. This may include screening for:  Breast cancer.  Cervical cancer.  Colorectal cancer.  Skin cancer.  Lung cancer. What should I know about heart disease, diabetes, and high blood pressure? Blood pressure and heart disease  High blood pressure causes heart disease and increases the risk of stroke. This is more likely to develop in people who have high blood pressure readings, are of African descent, or are overweight.  Have your blood pressure checked: ? Every 3-5 years if you are 60-18 years of age. ? Every year if you are 52 years old or older. Diabetes Have regular diabetes screenings. This checks your fasting blood sugar level. Have the screening done:  Once every three years after age 23 if you are at a normal weight and have a low risk for diabetes.  More often and at a younger age if you are overweight or have a high risk for diabetes. What should I know about preventing infection? Hepatitis B If you have a higher risk for hepatitis B, you should be screened for this virus. Talk with your health care provider to find out if you are at risk for hepatitis B infection. Hepatitis C Testing is recommended for:  Everyone born from [birth year] through 1966.  Anyone with known risk factors for hepatitis C. Sexually transmitted infections (STIs)  Get screened for STIs, including gonorrhea and chlamydia, if: ? You are sexually active and are younger than 71 years of age. ? You are  older than 71 years of age and your health care provider tells you that you are at risk for this type of infection. ? Your sexual activity has changed since you were last screened, and you are at increased risk for chlamydia or gonorrhea. Ask your health care provider if you are at risk.  Ask your health care provider about whether you are at high risk for HIV. Your health care provider may recommend a prescription medicine to help prevent HIV infection. If you  choose to take medicine to prevent HIV, you should first get tested for HIV. You should then be tested every 3 months for as long as you are taking the medicine. Pregnancy  If you are about to stop having your period (premenopausal) and you may become pregnant, seek counseling before you get pregnant.  Take 400 to 800 micrograms (mcg) of folic acid every day if you become pregnant.  Ask for birth control (contraception) if you want to prevent pregnancy. Osteoporosis and menopause Osteoporosis is a disease in which the bones lose minerals and strength with aging. This can result in bone fractures. If you are 110 years old or older, or if you are at risk for osteoporosis and fractures, ask your health care provider if you should:  Be screened for bone loss.  Take a calcium or vitamin D supplement to lower your risk of fractures.  Be given hormone replacement therapy (HRT) to treat symptoms of menopause. Follow these instructions at home: Lifestyle  Do not use any products that contain nicotine or tobacco, such as cigarettes, e-cigarettes, and chewing tobacco. If you need help quitting, ask your health care provider.  Do not use street drugs.  Do not share needles.  Ask your health care provider for help if you need support or information about quitting drugs. Alcohol use  Do not drink alcohol if: ? Your health care provider tells you not to drink. ? You are pregnant, may be pregnant, or are planning to become pregnant.  If you drink alcohol: ? Limit how much you use to 0-1 drink a day. ? Limit intake if you are breastfeeding.  Be aware of how much alcohol is in your drink. In the [place] one drink equals one 12 oz bottle of beer (355 mL), one 5 oz glass of wine (148 mL), or one 1 oz glass of hard liquor (44 mL). General instructions  Schedule regular health, dental, and eye exams.  Stay current with your vaccines.  Tell your health care provider if: ? You often feel  depressed. ? You have ever been abused or do not feel safe at home. Summary  Adopting a healthy lifestyle and getting preventive care are important in promoting health and wellness.  Follow your health care provider's instructions about healthy diet, exercising, and getting tested or screened for diseases.  Follow your health care provider's instructions on monitoring your cholesterol and blood pressure. This information is not intended to replace advice given to you by your health care provider. Make sure you discuss any questions you have with your health care provider. Document Revised: 01/02/2018 Document Reviewed: 01/02/2018 Elsevier Patient Education  2021 [employer].

## 2020-03-31 ENCOUNTER — Ambulatory Visit (INDEPENDENT_AMBULATORY_CARE_PROVIDER_SITE_OTHER): Payer: Medicare PPO | Admitting: Internal Medicine

## 2020-03-31 ENCOUNTER — Other Ambulatory Visit: Payer: Self-pay

## 2020-03-31 VITALS — BP 140/70 | HR 68 | Temp 97.9°F | Ht 66.0 in | Wt 118.0 lb

## 2020-03-31 DIAGNOSIS — Z0000 Encounter for general adult medical examination without abnormal findings: Secondary | ICD-10-CM | POA: Diagnosis not present

## 2020-03-31 DIAGNOSIS — R12 Heartburn: Secondary | ICD-10-CM

## 2020-03-31 DIAGNOSIS — K219 Gastro-esophageal reflux disease without esophagitis: Secondary | ICD-10-CM

## 2020-03-31 DIAGNOSIS — E039 Hypothyroidism, unspecified: Secondary | ICD-10-CM | POA: Diagnosis not present

## 2020-03-31 DIAGNOSIS — J449 Chronic obstructive pulmonary disease, unspecified: Secondary | ICD-10-CM

## 2020-03-31 DIAGNOSIS — D692 Other nonthrombocytopenic purpura: Secondary | ICD-10-CM

## 2020-03-31 DIAGNOSIS — M85859 Other specified disorders of bone density and structure, unspecified thigh: Secondary | ICD-10-CM | POA: Diagnosis not present

## 2020-03-31 LAB — COMPREHENSIVE METABOLIC PANEL
ALT: 50 U/L — ABNORMAL HIGH (ref 0–35)
AST: 32 U/L (ref 0–37)
Albumin: 4.4 g/dL (ref 3.5–5.2)
Alkaline Phosphatase: 78 U/L (ref 39–117)
BUN: 11 mg/dL (ref 6–23)
CO2: 30 mEq/L (ref 19–32)
Calcium: 9.7 mg/dL (ref 8.4–10.5)
Chloride: 103 mEq/L (ref 96–112)
Creatinine, Ser: 0.7 mg/dL (ref 0.40–1.20)
GFR: 87.44 mL/min (ref >60.00)
Glucose, Bld: 88 mg/dL (ref 70–99)
Potassium: 4 mEq/L (ref 3.5–5.1)
Sodium: 139 mEq/L (ref 135–145)
Total Bilirubin: 0.8 mg/dL (ref 0.2–1.2)
Total Protein: 7.1 g/dL (ref 6.0–8.3)

## 2020-03-31 LAB — CBC WITH DIFFERENTIAL/PLATELET
Basophils Absolute: 0.1 10*3/uL (ref 0.0–0.1)
Basophils Relative: 1.2 % (ref 0.0–3.0)
Eosinophils Absolute: 0.3 10*3/uL (ref 0.0–0.7)
Eosinophils Relative: 6.1 % — ABNORMAL HIGH (ref 0.0–5.0)
HCT: 42.6 % (ref 36.0–46.0)
Hemoglobin: 14.8 g/dL (ref 12.0–15.0)
Lymphocytes Relative: 21.5 % (ref 12.0–46.0)
Lymphs Abs: 1 10*3/uL (ref 0.7–4.0)
MCHC: 34.9 g/dL (ref 30.0–36.0)
MCV: 91 fl (ref 78.0–100.0)
Monocytes Absolute: 0.5 10*3/uL (ref 0.1–1.0)
Monocytes Relative: 10.5 % (ref 3.0–12.0)
Neutro Abs: 2.7 10*3/uL (ref 1.4–7.7)
Neutrophils Relative %: 60.7 % (ref 43.0–77.0)
Platelets: 222 10*3/uL (ref 150.0–400.0)
RBC: 4.67 Mil/uL (ref 3.87–5.11)
RDW: 12.9 % (ref 11.5–15.5)
WBC: 4.5 10*3/uL (ref 4.0–10.5)

## 2020-03-31 LAB — LIPID PANEL
Cholesterol: 197 mg/dL (ref 0–200)
HDL: 84.4 mg/dL (ref >39.00)
LDL Cholesterol: 103 mg/dL — ABNORMAL HIGH (ref 0–99)
NonHDL: 112.19
Total CHOL/HDL Ratio: 2
Triglycerides: 47 mg/dL (ref 0.0–149.0)
VLDL: 9.4 mg/dL (ref 0.0–40.0)

## 2020-03-31 MED ORDER — AMITRIPTYLINE HCL 10 MG PO TABS: 10.0000 mg | ORAL_TABLET | Freq: Every day | ORAL | 5 refills | Status: DC

## 2020-03-31 MED ORDER — LEVOTHYROXINE SODIUM 50 MCG PO TABS: 50.0000 ug | ORAL_TABLET | Freq: Every day | ORAL | 3 refills | Status: DC

## 2020-03-31 NOTE — Assessment & Plan Note (Signed)
Chronic GERD improved with omeprazole 40 mg twice daily, but she is still having symptoms of GERD a few times a week This is likely related to her having a hypersensitive esophagus for GI Discussed with her that there is nothing more she can do with diet or lifestyle After long discussion discussed starting amitriptyline as suggested by GI to see if that helps and she does agree Start amitriptyline 10 mg at bedtime-we will increase if tolerated Continue omeprazole 40 mg twice daily for now, but goal would be to decrease to once a day if the amitriptyline helps with some of her symptoms If she does not tolerate the amitriptyline-we will consider something like citalopram to see if that helps

## 2020-03-31 NOTE — Assessment & Plan Note (Signed)
Chronic  Clinically euthyroid Currently taking levothyroxine 50 mcg qd Check tsh  Titrate med dose if needed

## 2020-03-31 NOTE — Assessment & Plan Note (Signed)
New complaint She has bruising, easy bleeding, and is skin in her arms mostly, but even in her legs Discussed this is likely a combination of Celebrex, other supplements, the omeprazole, aging/thinning skin Reassured that this is not concerning

## 2020-03-31 NOTE — Assessment & Plan Note (Signed)
Chronic dexa up to date Exercising regularly Continue vitamin d and calcium

## 2020-03-31 NOTE — Assessment & Plan Note (Addendum)
Chronic Uses dulera twice daily Uses xopenex prn prior to intense exercise Overall controlled

## 2020-03-31 NOTE — Assessment & Plan Note (Signed)
Chronic Taking omeprazole 40 mg BIDac She will sometimes still have gerd sat or sunday

## 2020-04-01 ENCOUNTER — Telehealth: Payer: Self-pay | Admitting: Internal Medicine

## 2020-04-01 LAB — TSH: TSH: 2.41 u[IU]/mL (ref 0.35–4.50)

## 2020-04-01 LAB — VITAMIN D 25 HYDROXY (VIT D DEFICIENCY, FRACTURES): VITD: 62.95 ng/mL (ref 30.00–100.00)

## 2020-04-01 MED ORDER — CELECOXIB 100 MG PO CAPS: 100.0000 mg | ORAL_CAPSULE | Freq: Every day | ORAL | Status: DC

## 2020-04-01 NOTE — Telephone Encounter (Signed)
Advise her  Just to monitor her symptom for now -- heartburn will not cause bronchitis quickly.  We do not want to give an antibiotic if it is not necessary.  If she develops concerning symptoms suggestive of bronchitis she should call us or pulmonary.

## 2020-04-01 NOTE — Telephone Encounter (Signed)
Patient calling to report heartburn. She states she is concerned that this may turn into something worse. She is concerned she may be getting bronchitis

## 2020-04-01 NOTE — Telephone Encounter (Signed)
Spoke with patient today and info given. 

## 2020-04-19 ENCOUNTER — Encounter: Payer: Self-pay | Admitting: Podiatry

## 2020-04-19 ENCOUNTER — Ambulatory Visit: Payer: Medicare PPO | Admitting: Podiatry

## 2020-04-19 ENCOUNTER — Other Ambulatory Visit: Payer: Self-pay

## 2020-04-19 DIAGNOSIS — B351 Tinea unguium: Secondary | ICD-10-CM | POA: Diagnosis not present

## 2020-04-19 DIAGNOSIS — M79674 Pain in right toe(s): Secondary | ICD-10-CM

## 2020-04-19 DIAGNOSIS — Q828 Other specified congenital malformations of skin: Secondary | ICD-10-CM

## 2020-04-19 DIAGNOSIS — M79675 Pain in left toe(s): Secondary | ICD-10-CM | POA: Diagnosis not present

## 2020-04-19 NOTE — Progress Notes (Signed)
Subjective: Tina Lucas is a 70 y.o. female patient seen today painful porokeratotic lesion(s) right foot and painful mycotic toenails that limit ambulation. Painful toenails interfere with ambulation. Aggravating factors include wearing enclosed shoe gear. Pain is relieved with periodic professional debridement. Painful porokeratotic lesions are aggravated when weightbearing with and without shoegear. Pain is relieved with periodic professional debridement.   She voices no new pedal concerns on today's visit. She likes to remain active and loves playing [employer].  She states she purchased the Dr. Marlaine Hind pad and her right foot callus has resolved.  PCP is Dr. Billey Gosling and last visit was 03/31/2020.  Allergies  Allergen Reactions  . Singulair [Montelukast Sodium] Other (See Comments)    headaches   Objective: Physical Exam General: Tina Lucas is a pleasant 70 y.o.  Caucasian female, WD, WN in NAD. AAO x 3.   Vascular:  Capillary refill time to digits immediate b/l. Palpable DP pulses b/l. Palpable PT pulses b/l. Pedal hair present b/l. Skin temperature gradient within normal limits b/l. No edema noted b/l.  Dermatological:  Pedal skin with normal turgor, texture and tone bilaterally. No open wounds bilaterally. No interdigital macerations bilaterally. Toenails 1-5 b/l elongated, discolored, dystrophic, thickened, crumbly with subungual debris and tenderness to dorsal palpation. Porokeratotic lesion(s) R hallux. No erythema, no edema, no drainage, no fluctuance.   Right foot callus submet head 5 has resolved.  Musculoskeletal:  Normal muscle strength 5/5 to all lower extremity muscle groups bilaterally. No gross bony deformities bilaterally. No pain crepitus or joint limitation noted with ROM b/l.  Neurological:  Protective sensation intact 5/5 intact bilaterally with 10g monofilament b/l. Vibratory sensation intact b/l.  Assessment and Plan:  1. Pain due to onychomycosis of  toenails of both feet   2. Porokeratosis   3. Pain in right toe(s)    -Examined patient. -No new findings. No new orders. -Toenails 1-5 b/l were debrided in length and girth with sterile nail nippers and dremel without iatrogenic bleeding.  -Corn(s) R hallux pared utilizing sterile scalpel blade without complication or incident. Total number debrided=1. -Patient to report any pedal injuries to medical professional immediately. -Patient/POA to call should there be any concerns in the interim.  Return in about 3 months (around 07/20/2020).  Marzetta Board, DPM

## 2020-05-03 ENCOUNTER — Other Ambulatory Visit: Payer: Self-pay

## 2020-05-03 NOTE — Progress Notes (Signed)
Subjective:    Patient ID: Tina Lucas, female    DOB: Nov 04, 1950, 70 y.o.   MRN: [id number]  HPI The patient is here for follow up of their chronic medical problems, including GERD.   Gerd symptoms daily to qod despite prilosec 80 mg daily.  She is very frustrated.  She is able to do all her activities.  She does not find eating as enjoyable.   She feels like her food is stuck at the base of her esophagus.   She tried the amitriptyline and had side effects so had to stop it.    Taking melatonin 7 mg at night.    Medications and allergies reviewed with patient and updated if appropriate.  Patient Active Problem List   Diagnosis Date Noted  . Senile purpura ([place]) 03/31/2020  . Pain in right buttock 10/21/2019  . Belching   . Upper back pain on left side 07/15/2019  . Hiatal hernia 06/15/2019  . Chest wall discomfort 07/22/2018  . Pyrosis 04/26/2018  . Gastric ulcer without hemorrhage or perforation 04/26/2018  . Abnormal findings on esophagogastroduodenoscopy (EGD) 04/26/2018  . Atypical chest pain 02/26/2018  . Asthma-COPD overlap syndrome ([place]) 01/31/2018  . Chronic neck pain 08/27/2017  . GERD (gastroesophageal reflux disease) 04/20/2016  . Numbness 08/06/2015  . Osteopenia 12/10/2014  . Allergic urticaria 12/10/2014  . Pressure in head 04/01/2014  . Vertigo 02/10/2014  . Elevated blood pressure reading 11/06/2013  . Hypothyroid   . Primary HSV infection of mouth     Current Outpatient Medications on File Prior to Visit  Medication Sig Dispense Refill  . Calcium Carbonate-Vitamin D 600-400 MG-UNIT tablet Take 1 tablet by mouth daily.    . celecoxib (CELEBREX) 100 MG capsule Take 1 capsule (100 mg total) by mouth daily.    . cholecalciferol (VITAMIN D) 400 UNITS TABS tablet Take 400 Units by mouth.    . diclofenac Sodium (VOLTAREN) 1 % GEL     . fexofenadine (ALLEGRA) 180 MG tablet Take 180 mg by mouth daily.    . fluocinonide cream (LIDEX) 4.09 % Apply 1  application topically 2 (two) times daily as needed (FOR PSORIASIS).     . hydrocortisone 2.5 % cream     . levalbuterol (XOPENEX HFA) 45 MCG/ACT inhaler INHALE ONE TO TWO PUFFS BY MOUTH EVERY 4 HOURS AS NEEDED FOR WHEEZING 15 g 2  . levothyroxine (SYNTHROID) 50 MCG tablet Take 1 tablet (50 mcg total) by mouth daily with breakfast. 90 tablet 3  . Magnesium 250 MG TABS Take 125 mg by mouth daily.    . mometasone-formoterol (DULERA) 200-5 MCG/ACT AERO Inhale 2 puffs into the lungs 2 (two) times daily. Appt needed for future refills. 13 g 11  . Multiple Vitamins-Minerals (ONE-A-DAY WOMENS 50+ ADVANTAGE PO) Take by mouth daily.    Marland Kitchen omeprazole (PRILOSEC) 40 MG capsule Take 1 capsule (40 mg total) by mouth 2 (two) times daily before a meal. 180 capsule 1  . prednisoLONE acetate (PRED FORTE) 1 % ophthalmic suspension Place 1 drop into the right eye daily. Pt takes 2 times per week    . triamcinolone (KENALOG) 0.1 % paste      No current facility-administered medications on file prior to visit.    Past Medical History:  Diagnosis Date  . Allergic rhinitis   . Allergy   . Asthma    as a child  . 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 COPD (  chronic obstructive pulmonary disease) ([place])   . History of asthma 1956-2000  . Hypothyroid 2013  . Osteopenia   . Primary HSV infection of mouth 2008  . Pulmonary nodule   . 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 [place] STUDY N/A 08/13/2019   Procedure: 24 HOUR PH STUDY- impedence;  Surgeon: Irving Copas., MD;  Location: [facility];  Service: Gastroenterology;  Laterality: N/A;  . COLONOSCOPY    . ESOPHAGEAL MANOMETRY N/A 08/13/2019   Procedure: ESOPHAGEAL MANOMETRY (EM);  Surgeon: Irving Copas., MD;  Location: [facility];  Service: Gastroenterology;  Laterality: N/A;  . [place] IMPEDANCE  STUDY  08/13/2019   Procedure: [place] IMPEDANCE STUDY;  Surgeon: Rush Landmark Telford Nab., MD;  Location: [facility];  Service: Gastroenterology;;  . polyp on vocal chord     . TONSILLECTOMY  1958    Social History   Socioeconomic History  . Marital status: Married    Spouse name: Not on file  . Number of children: Not on file  . Years of education: Not on file  . Highest education level: Not on file  Occupational History  . Not on file  Tobacco Use  . Smoking status: Never Smoker  . Smokeless tobacco: Never Used  Vaping Use  . Vaping Use: Never used  Substance and Sexual Activity  . Alcohol use: Not Currently    Alcohol/week: 0.0 standard drinks  . Drug use: No  . Sexual activity: Not on file  Other Topics Concern  . Not on file  Social History Narrative   Lives with husband in a one story home.  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   Social Determinants of Health   Financial Resource Strain: Low Risk   . Difficulty of Paying Living Expenses: Not hard at all  Food Insecurity: No Food Insecurity  . Worried About Charity fundraiser in the Last Year: Never true  . Ran Out of Food in the Last Year: Never true  Transportation Needs: No Transportation Needs  . Lack of Transportation (Medical): No  . Lack of Transportation (Non-Medical): No  Physical Activity: Sufficiently Active  . Days of Exercise per Week: 7 days  . Minutes of Exercise per Session: 60 min  Stress: No Stress Concern Present  . Feeling of Stress : Not at all  Social Connections: Socially Integrated  . Frequency of Communication with Friends and Family: More than three times a week  . Frequency of Social Gatherings with Friends and Family: More than three times a week  . Attends Religious Services: More than 4 times per year  . Active Member of Clubs or Organizations: Yes  . Attends Archivist Meetings: More than 4 times per year  . Marital Status: Married    Family History   Problem Relation Age of Onset  . Breast cancer Mother 92  . Osteoarthritis Father   . Hypertension Father   . Leukemia Maternal Aunt 38  . Atrial fibrillation Brother   . Colon cancer Neg Hx   . Esophageal cancer Neg Hx   . Inflammatory bowel disease Neg Hx   . Liver disease Neg Hx   . Pancreatic cancer Neg Hx   . Rectal cancer Neg Hx   . Stomach cancer Neg Hx     Review of Systems     Objective:   Vitals:   05/04/20 1022  BP: 134/72  Pulse: 62  Temp: 98 F (36.7 C)  SpO2: 99%   BP Readings from Last 3 Encounters:  05/04/20 134/72  03/31/20 140/70  03/25/20 128/78   Wt Readings from Last 3 Encounters:  05/04/20 119 lb 12.8 oz (54.3 kg)  03/31/20 118 lb (53.5 kg)  03/25/20 119 lb 9.6 oz (54.3 kg)   Body mass index is 19.34 kg/m.   Physical Exam    Constitutional: Appears well-developed and well-nourished. No distress.  Skin: Skin is warm and dry. Not diaphoretic.  Psychiatric: anxious mood and affect. Behavior is normal.      Assessment & Plan:    See Problem List for Assessment and Plan of chronic medical problems.    This visit occurred during the SARS-CoV-2 public health emergency.  Safety protocols were in place, including screening questions prior to the visit, additional usage of staff PPE, and extensive cleaning of exam room while observing appropriate contact time as indicated for disinfecting solutions.

## 2020-05-04 ENCOUNTER — Ambulatory Visit: Payer: Medicare PPO | Admitting: Internal Medicine

## 2020-05-04 ENCOUNTER — Encounter: Payer: Self-pay | Admitting: Internal Medicine

## 2020-05-04 VITALS — BP 134/72 | HR 62 | Temp 98.0°F | Ht 66.0 in | Wt 119.8 lb

## 2020-05-04 DIAGNOSIS — K219 Gastro-esophageal reflux disease without esophagitis: Secondary | ICD-10-CM | POA: Diagnosis not present

## 2020-05-04 MED ORDER — SERTRALINE HCL 25 MG PO TABS: 25.0000 mg | ORAL_TABLET | Freq: Every day | ORAL | 5 refills | Status: DC

## 2020-05-04 NOTE — Patient Instructions (Addendum)
  Start sertraline 25 mg daily at night.   A few days after you start that decrease prilosec to 2 a day alternating with 1 pill a day at night and after 2-4 weeks just take 1 prilosec pill a day.     If you tolerate the sertraline after 3 weeks we can increase it to 50 mg daily.

## 2020-05-04 NOTE — Assessment & Plan Note (Signed)
Chronic GERD controlled with Prilosec 40 mg twice daily-still having symptoms every day to every other day Most of her symptoms are likely related to a hypersensitive esophagus per GI Did not tolerate amitriptyline 10 mg at bedtime Will try sertraline 25 mg at bedtime-we will increase if tolerated We will try decreasing Prilosec from 40 mg twice daily to 40 mg daily slowly over at least 2 weeks, possibly longer depending on her symptoms

## 2020-05-06 DIAGNOSIS — Z1231 Encounter for screening mammogram for malignant neoplasm of breast: Secondary | ICD-10-CM | POA: Diagnosis not present

## 2020-05-10 ENCOUNTER — Other Ambulatory Visit: Payer: Self-pay | Admitting: Pulmonary Disease

## 2020-06-03 ENCOUNTER — Other Ambulatory Visit: Payer: Self-pay | Admitting: Internal Medicine

## 2020-07-21 DIAGNOSIS — D2271 Melanocytic nevi of right lower limb, including hip: Secondary | ICD-10-CM | POA: Diagnosis not present

## 2020-07-21 DIAGNOSIS — D2272 Melanocytic nevi of left lower limb, including hip: Secondary | ICD-10-CM | POA: Diagnosis not present

## 2020-07-21 DIAGNOSIS — D1801 Hemangioma of skin and subcutaneous tissue: Secondary | ICD-10-CM | POA: Diagnosis not present

## 2020-07-21 DIAGNOSIS — D1721 Benign lipomatous neoplasm of skin and subcutaneous tissue of right arm: Secondary | ICD-10-CM | POA: Diagnosis not present

## 2020-07-21 DIAGNOSIS — D225 Melanocytic nevi of trunk: Secondary | ICD-10-CM | POA: Diagnosis not present

## 2020-07-21 DIAGNOSIS — D2261 Melanocytic nevi of right upper limb, including shoulder: Secondary | ICD-10-CM | POA: Diagnosis not present

## 2020-07-21 DIAGNOSIS — D692 Other nonthrombocytopenic purpura: Secondary | ICD-10-CM | POA: Diagnosis not present

## 2020-07-21 DIAGNOSIS — L821 Other seborrheic keratosis: Secondary | ICD-10-CM | POA: Diagnosis not present

## 2020-07-21 DIAGNOSIS — D2262 Melanocytic nevi of left upper limb, including shoulder: Secondary | ICD-10-CM | POA: Diagnosis not present

## 2020-08-04 ENCOUNTER — Ambulatory Visit (INDEPENDENT_AMBULATORY_CARE_PROVIDER_SITE_OTHER)
Admission: RE | Admit: 2020-08-04 | Discharge: 2020-08-04 | Disposition: A | Discharge status: Home / Self Care | Payer: Medicare PPO | Source: Ambulatory Visit | Attending: Pulmonary Disease | Admitting: Pulmonary Disease

## 2020-08-04 ENCOUNTER — Other Ambulatory Visit: Payer: Self-pay

## 2020-08-04 DIAGNOSIS — R9389 Abnormal findings on diagnostic imaging of other specified body structures: Secondary | ICD-10-CM | POA: Diagnosis not present

## 2020-08-04 DIAGNOSIS — R911 Solitary pulmonary nodule: Secondary | ICD-10-CM | POA: Diagnosis not present

## 2020-08-04 DIAGNOSIS — I700 Atherosclerosis of aorta: Secondary | ICD-10-CM | POA: Diagnosis not present

## 2020-08-09 ENCOUNTER — Encounter: Payer: Self-pay | Admitting: Family Medicine

## 2020-08-09 ENCOUNTER — Ambulatory Visit (INDEPENDENT_AMBULATORY_CARE_PROVIDER_SITE_OTHER): Payer: Medicare PPO | Admitting: Podiatry

## 2020-08-09 ENCOUNTER — Encounter: Payer: Self-pay | Admitting: Internal Medicine

## 2020-08-09 ENCOUNTER — Telehealth (INDEPENDENT_AMBULATORY_CARE_PROVIDER_SITE_OTHER): Payer: Medicare PPO | Admitting: Family Medicine

## 2020-08-09 ENCOUNTER — Other Ambulatory Visit: Payer: Self-pay

## 2020-08-09 VITALS — Ht 66.0 in | Wt 118.0 lb

## 2020-08-09 DIAGNOSIS — U071 COVID-19: Secondary | ICD-10-CM

## 2020-08-09 DIAGNOSIS — Q828 Other specified congenital malformations of skin: Secondary | ICD-10-CM

## 2020-08-09 MED ORDER — MOLNUPIRAVIR EUA 200MG CAPSULE: 4.0000 | ORAL_CAPSULE | Freq: Two times a day (BID) | ORAL | 0 refills | Status: AC

## 2020-08-09 NOTE — Progress Notes (Signed)
[place] at [facility] [place], [place], [place] [address] [phone]  Date:  08/09/2020   Name:  Tina Lucas   DOB:  [birth year]   MRN:  [id number]  PCP:  Binnie Rail, MD    Chief Complaint: Covid Positive (Body ache - last night- test neg//Did another test and it was pos today. )   History of Present Illness:  Tina Lucas is a 70 y.o. very pleasant female patient who presents with the following:  Virtual visit for pt with covid 19- primary pt of Tina Lucas  Pt location is home, my location is office  She first got sick yesterday afternoon with possible covid 19 sx  She has history of COPD She and her husband are both fully vaccinated incl 2 boosters She feels a bit better today She tested positive for covid today-  She has noted chills, headache and body aches Her temp this am was 99.6 She vomited twice this am and had some diarrhea  She does have SOB at baseline She was dx with COPD in 2020- pt was never a smoker but she had 2nd hand smoke exposure Also history of asthma  She has more cough today which can produce some mucus  She does use her albuterol inhaler as needed for exercise   She is very active for her age and plays pickleball several x a week- she played last week   Her husband has not tested positive yet   Patient Active Problem List   Diagnosis Date Noted   Senile purpura ([place]) 03/31/2020   Pain in right buttock 10/21/2019   Belching    Upper back pain on left side 07/15/2019   Hiatal hernia 06/15/2019   Chest wall discomfort 07/22/2018   Pyrosis 04/26/2018   Gastric ulcer without hemorrhage or perforation 04/26/2018   Abnormal findings on esophagogastroduodenoscopy (EGD) 04/26/2018   Atypical chest pain 02/26/2018   Asthma-COPD overlap syndrome ([place]) 01/31/2018   Chronic neck pain 08/27/2017   GERD (gastroesophageal reflux disease) 04/20/2016   Numbness 08/06/2015   Osteopenia  12/10/2014   Allergic urticaria 12/10/2014   Pressure in head 04/01/2014   Vertigo 02/10/2014   Elevated blood pressure reading 11/06/2013   Hypothyroid    Primary HSV infection of mouth     Past Medical History:  Diagnosis Date   Allergic rhinitis    Allergy    Asthma    as a child   Cataract    surgical correction, 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   COPD (chronic obstructive pulmonary disease) ([place])    History of asthma 1956-2000   Hypothyroid 2013   Osteopenia    Primary HSV infection of mouth 2008   Pulmonary nodule    Sunlight-induced angio-edema-urticaria 07/11/2011   unknown trigger, neg autoimmune workup    Past Surgical History:  Procedure Laterality Date   53 HOUR PH STUDY N/A 08/13/2019   Procedure: 24 HOUR PH STUDY- impedence;  Surgeon: Irving Copas., MD;  Location: [facility];  Service: Gastroenterology;  Laterality: N/A;   COLONOSCOPY     ESOPHAGEAL MANOMETRY N/A 08/13/2019   Procedure: ESOPHAGEAL MANOMETRY (EM);  Surgeon: Irving Copas., MD;  Location: [facility];  Service: Gastroenterology;  Laterality: N/A;   [place] IMPEDANCE STUDY  08/13/2019   Procedure: [place] IMPEDANCE STUDY;  Surgeon: Rush Landmark Telford Nab., MD;  Location: [facility];  Service: Gastroenterology;;   polyp on vocal chord  TONSILLECTOMY  1958    Social History   Tobacco Use   Smoking status: Never   Smokeless tobacco: Never  Vaping Use   Vaping Use: Never used  Substance Use Topics   Alcohol use: Not Currently    Alcohol/week: 0.0 standard drinks   Drug use: No    Family History  Problem Relation Age of Onset   Breast cancer Mother 54   Osteoarthritis Father    Hypertension Father    Leukemia Maternal Aunt 28   Atrial fibrillation Brother    Colon cancer Neg Hx    Esophageal cancer Neg Hx    Inflammatory bowel disease Neg Hx    Liver disease Neg Hx    Pancreatic cancer Neg Hx    Rectal cancer Neg Hx    Stomach cancer Neg Hx     Allergies   Allergen Reactions   Singulair [Montelukast Sodium] Other (See Comments)    headaches    Medication list has been reviewed and updated.  Current Outpatient Medications on File Prior to Visit  Medication Sig Dispense Refill   Calcium Carbonate-Vitamin D 600-400 MG-UNIT tablet Take 1 tablet by mouth daily.     celecoxib (CELEBREX) 100 MG capsule Take 1 capsule (100 mg total) by mouth daily.     cholecalciferol (VITAMIN D) 400 UNITS TABS tablet Take 400 Units by mouth.     diclofenac Sodium (VOLTAREN) 1 % GEL      DULERA 200-5 MCG/ACT AERO INHALE TWO PUFFS BY MOUTH TWICE A DAY 13 g 9   fexofenadine (ALLEGRA) 180 MG tablet Take 180 mg by mouth daily.     fluocinonide cream (LIDEX) 4.26 % Apply 1 application topically 2 (two) times daily as needed (FOR PSORIASIS).      hydrocortisone 2.5 % cream      levalbuterol (XOPENEX HFA) 45 MCG/ACT inhaler INHALE ONE TO TWO PUFFS BY MOUTH EVERY 4 HOURS AS NEEDED FOR WHEEZING 15 g 2   levothyroxine (SYNTHROID) 50 MCG tablet TAKE ONE TABLET BY MOUTH DAILY WITH BREAKFAST 90 tablet 3   Magnesium 250 MG TABS Take 125 mg by mouth daily.     Multiple Vitamins-Minerals (ONE-A-DAY WOMENS 50+ ADVANTAGE PO) Take by mouth daily.     omeprazole (PRILOSEC) 40 MG capsule Take 1 capsule (40 mg total) by mouth 2 (two) times daily before a meal. 180 capsule 1   prednisoLONE acetate (PRED FORTE) 1 % ophthalmic suspension Place 1 drop into the right eye daily. Pt takes 2 times per week     sertraline (ZOLOFT) 25 MG tablet Take 1 tablet (25 mg total) by mouth at bedtime. 30 tablet 5   triamcinolone (KENALOG) 0.1 % paste      No current facility-administered medications on file prior to visit.    Review of Systems:  As per HPI- otherwise negative.   Physical Examination: There were no vitals filed for this visit. Vitals:   08/09/20 1522  Weight: 118 lb (53.5 kg)  Height: 5\' 6"  (1.676 m)   Body mass index is 19.05 kg/m. Ideal Body Weight: Weight in (lb) to  have BMI = 25: 154.6  She notes that her oxygen was 98% this am  She also does check her pulse and BP daily  Pt observed via video monitor- she looks well and shows no distress  Assessment and Plan: COVID-19 - Plan: molnupiravir EUA 200 mg CAPS Virtual visit today for covid infection- video used for duration of visit today  She is fully vaccinated including  2 boosters  History of COPD Will treat with antivirals as above Discussed indications to seek further care/ ER   Signed Lamar Blinks, MD

## 2020-08-12 ENCOUNTER — Telehealth (INDEPENDENT_AMBULATORY_CARE_PROVIDER_SITE_OTHER): Payer: Medicare PPO | Admitting: Pulmonary Disease

## 2020-08-12 DIAGNOSIS — R9389 Abnormal findings on diagnostic imaging of other specified body structures: Secondary | ICD-10-CM | POA: Diagnosis not present

## 2020-08-12 NOTE — Telephone Encounter (Signed)
[facility] Telephone Encounter   Virtual Visit via Telephone Note  I connected with Tina Lucas on @TODAY @ at  by telephone and verified that I am speaking with the correct person using two identifiers.  Location: Patient: Home Provider: Home office   I discussed the limitations, risks, security and privacy concerns of performing an evaluation and management service by telephone and the availability of in person appointments. I also discussed with the patient that there may be a patient responsible charge related to this service. The patient expressed understanding and agreed to proceed.   History of Present Illness: [name] is a 70 year old female never smoker with asthma-COPD overlap.  Per last clinic note patient was to return to clinic after CT scan for right upper lobe nodules however in-person appointment not scheduled.  Discussed CT Chest 07/30/20 with patient via telephone. Interval resolution of prior right upper lung nodules however new interval developed of 7 mm nodule in the right apex with mild subpleural retraction. We discussed her nodules may represent subacute infection or inflammation though I am worried that this new nodule may have more of a solid component with the associated retraction. She is unsure if she would pursue diagnostic testing or treatment if this nodule becomes more concerning for malignancy. For now she wishes to continue surveillance imaging.  Of note she recently was diagnosed with COVID-19 this week. Has sore throat otherwise no progressive symptoms since starting prescribed antiviral two days ago.   Observations/Objective: No respiratory distress. No audible wheezing  Assessment and Plan:  Right apical lung nodule Right lower lobe nodules Inflammatory/infectious pulmonary nodules in RUL - resolved --CT Chest without contrast in 5 months (prefers first week of December in the mornings) --Patient will need follow-up with me after  imaging. Please place on recall list.  Follow Up Instructions: Will arrange CT scan and follow-up   I discussed the assessment and treatment plan with the patient. The patient was provided an opportunity to ask questions and all were answered. The patient agreed with the plan and demonstrated an understanding of the instructions.   The patient was advised to call back or seek an in-person evaluation if the symptoms worsen or if the condition fails to improve as anticipated.  I provided 30 minutes of non-face-to-face time during this encounter.   Ambrie Carte Rodman Pickle, MD   Rodman Pickle, M.D. [facility] 08/12/2020 11:12 AM

## 2020-08-12 NOTE — Telephone Encounter (Signed)
Order for CT placed to be done early December in the morning Recall placed for follow up after the CT  Nothing further needed at this time.

## 2020-08-15 NOTE — Progress Notes (Signed)
No show for appt. 

## 2020-08-31 ENCOUNTER — Encounter: Payer: Self-pay | Admitting: Podiatry

## 2020-08-31 ENCOUNTER — Other Ambulatory Visit: Payer: Self-pay

## 2020-08-31 ENCOUNTER — Ambulatory Visit: Payer: Medicare PPO | Admitting: Podiatry

## 2020-08-31 DIAGNOSIS — M79674 Pain in right toe(s): Secondary | ICD-10-CM | POA: Diagnosis not present

## 2020-08-31 DIAGNOSIS — M79675 Pain in left toe(s): Secondary | ICD-10-CM | POA: Diagnosis not present

## 2020-08-31 DIAGNOSIS — B351 Tinea unguium: Secondary | ICD-10-CM

## 2020-09-05 NOTE — Progress Notes (Signed)
Subjective: Tina Lucas is a pleasant 70 y.o. female patient seen today painful thick toenails that are difficult to trim. Pain interferes with ambulation. Aggravating factors include wearing enclosed shoe gear. Pain is relieved with periodic professional debridement.  She relates corn of right hallux has resolved due to wearing toe separator.   She was diagnosed with COVID on last month and has been treated and recovered. Mrs. Cammer continues to enjoy an active lifestyle playing pickleball on a regular basis.  PCP is Binnie Rail, MD. Last visit was: 05/04/2020.  Allergies  Allergen Reactions   Singulair [Montelukast Sodium] Other (See Comments)    headaches    Objective: Physical Exam  General: Tina Lucas is a pleasant 70 y.o. Caucasian female, WD, WN in NAD. AAO x 3.   Vascular:  Capillary refill time to digits immediate b/l. Palpable pedal pulses b/l LE. Pedal hair present. Lower extremity skin temperature gradient within normal limits. No pain with calf compression b/l. No edema noted b/l lower extremities.  Dermatological:  Pedal skin with normal turgor, texture and tone b/l lower extremities. No open wounds b/l lower extremities. No interdigital macerations b/l lower extremities. Toenails 1-5 b/l elongated, discolored, dystrophic, thickened, crumbly with subungual debris and tenderness to dorsal palpation. No hyperkeratotic nor porokeratotic lesions present on today's visit.  Musculoskeletal:  Normal muscle strength 5/5 to all lower extremity muscle groups bilaterally. No pain crepitus or joint limitation noted with ROM b/l lower extremities. No gross bony deformities b/l lower extremities.  Neurological:  Protective sensation intact 5/5 intact bilaterally with 10g monofilament b/l. Vibratory sensation intact b/l.  Assessment and Plan:  1. Pain due to onychomycosis of toenails of both feet   -Examined patient. -Patient to continue soft, supportive shoe gear  daily. -Toenails 1-5 b/l were debrided in length and girth with sterile nail nippers and dremel without iatrogenic bleeding.  -Patient to report any pedal injuries to medical professional immediately. -Patient/POA to call should there be question/concern in the interim.  Return in about 4 months (around 12/31/2020).  Marzetta Board, DPM

## 2020-09-10 ENCOUNTER — Telehealth: Payer: Self-pay | Admitting: Pulmonary Disease

## 2020-09-10 NOTE — Telephone Encounter (Signed)
Ok to schedule CT Chest without contrast. No earlier than October 14th.

## 2020-09-10 NOTE — Addendum Note (Signed)
Addended by: Elby Beck R on: 09/10/2020 05:34 PM   Modules accepted: Orders

## 2020-09-10 NOTE — Telephone Encounter (Signed)
Order has been changed to after October 14th. Called and spoke with patient to let her know. She expressed understanding. Nothing further needed at this time.

## 2020-09-10 NOTE — Telephone Encounter (Signed)
Pt stated that she would like her CT scan done sometime in October she said that she would not like the scan done in December anymore she stated that Dr. Loanne Drilling left it up to her as to when she would like it done. Will route to [facility]; Sherry and Dr. Loanne Drilling. Pls regard; [phone].

## 2020-09-13 NOTE — Telephone Encounter (Signed)
Order has been changed to do after 10/14.  I made [facility] aware so order can be pulled and put with CT's to be scheduled in Oct.

## 2020-10-01 ENCOUNTER — Other Ambulatory Visit: Payer: Self-pay | Admitting: Pulmonary Disease

## 2020-10-05 ENCOUNTER — Other Ambulatory Visit: Payer: Self-pay | Admitting: Internal Medicine

## 2020-10-27 ENCOUNTER — Other Ambulatory Visit: Payer: Self-pay | Admitting: Internal Medicine

## 2020-11-15 ENCOUNTER — Other Ambulatory Visit: Payer: Self-pay

## 2020-11-15 ENCOUNTER — Ambulatory Visit (INDEPENDENT_AMBULATORY_CARE_PROVIDER_SITE_OTHER)
Admission: RE | Admit: 2020-11-15 | Discharge: 2020-11-15 | Disposition: A | Discharge status: Home / Self Care | Payer: Medicare PPO | Source: Ambulatory Visit | Attending: Pulmonary Disease | Admitting: Pulmonary Disease

## 2020-11-15 DIAGNOSIS — R911 Solitary pulmonary nodule: Secondary | ICD-10-CM | POA: Diagnosis not present

## 2020-11-15 DIAGNOSIS — I700 Atherosclerosis of aorta: Secondary | ICD-10-CM | POA: Diagnosis not present

## 2020-11-15 DIAGNOSIS — R918 Other nonspecific abnormal finding of lung field: Secondary | ICD-10-CM | POA: Diagnosis not present

## 2020-11-15 DIAGNOSIS — R9389 Abnormal findings on diagnostic imaging of other specified body structures: Secondary | ICD-10-CM | POA: Diagnosis not present

## 2020-11-18 ENCOUNTER — Ambulatory Visit: Payer: Medicare PPO | Admitting: Pulmonary Disease

## 2020-11-18 ENCOUNTER — Encounter: Payer: Self-pay | Admitting: Pulmonary Disease

## 2020-11-18 ENCOUNTER — Other Ambulatory Visit: Payer: Self-pay

## 2020-11-18 VITALS — BP 122/82 | HR 68 | Temp 97.9°F | Ht 67.0 in | Wt 119.0 lb

## 2020-11-18 DIAGNOSIS — J449 Chronic obstructive pulmonary disease, unspecified: Secondary | ICD-10-CM | POA: Diagnosis not present

## 2020-11-18 DIAGNOSIS — R918 Other nonspecific abnormal finding of lung field: Secondary | ICD-10-CM | POA: Diagnosis not present

## 2020-11-18 MED ORDER — DULERA 200-5 MCG/ACT IN AERO: 2.0000 | INHALATION_SPRAY | Freq: Two times a day (BID) | RESPIRATORY_TRACT | 11 refills | Status: DC

## 2020-11-18 NOTE — Patient Instructions (Addendum)
Moderate persistent COPD asthma overlap- well-controlled CONTINUE Dulera 200-5 mcg TWO puffs TWICE a day. REFILLED CONTINUE Xopenex inhaler as needed for chest tightness. OK to take before activity. CONTINUE Protonix   Multiple subsolid lung nodules Waxing and waning pulmonary nodules including new 9 mm nodule in right upper lobe. This may be reflection of recent COVID-19 infection however with her history of nodules would recommend at least surveillance imaging vs diagnostic testing via bronchoscopy. She is currently asymptomatic. Will defer invasive testing for now until she is symptomatic --ORDER CT Chest without contrast in 3 months (patient prefers between 9-10 am)  Follow-up with me in 3 months after CT scan

## 2020-11-18 NOTE — Progress Notes (Signed)
Subjective:   PATIENT ID: Tina Lucas GENDER: female DOB: [birth year], MRN: [id number]   HPI  Chief Complaint  Patient presents with   Follow-up    CT scan review and COPD    Reason for Visit: Follow-up  [name] is a 70 year old female never smoker with childhood asthma (shots, nebulizers and inhalers) who presents for follow-up.  02/16/20 Since our last visit, she reports shortness of breath that restarted when she cut back on prilosec 40 mg BID. She had decreased her PPI after her GI work-up but noticed gradually worsening symptoms like belching, retrosternal burning. This is associated with shortness of breath and needing her Xopenex once a day. Denies cough or wheezing. She discussed with her PCP last week and is restarting her PPI. Otherwise she has been compliant with her Dulera. Still plays pickleball with her grandchildren. Symptoms are triggered by activity and heat. No cough or wheezing.  11/18/20 Since our last visit, she had COVID-19 in 07/2021 and has recovered fully. She has been compliant with her Dulera. She is playing pickleball 3-4 times a week and only needing xopenex once a day for shortness of breath. Denies wheezing or coughing She is pleased with her current quality of life. She is anxious regarding her CT scan.  Asthma Control Test ACT Total Score  02/16/2020 12  12/15/2019 20  05/19/2019 12   Social History: Never smoker Retired Pharmacist, hospital MS degree in exercise physiology No exposures to COVID-19.  She lives in [place] near her grandchildren  Environmental exposures:  Significant second-hand (father) smoke exposure and noticeably improved after moving for college.   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     celecoxib (CELEBREX) 100 MG capsule TAKE ONE CAPSULE BY MOUTH TWICE A DAY 180 capsule 2   cholecalciferol (VITAMIN D) 400 UNITS TABS tablet Take 400 Units by mouth.     diclofenac Sodium (VOLTAREN) 1 % GEL      DULERA 200-5 MCG/ACT AERO INHALE TWO PUFFS BY MOUTH TWICE A DAY 13 g 9   fexofenadine (ALLEGRA) 180 MG tablet Take 180 mg by mouth daily.     fluocinonide cream (LIDEX) 3.81 % Apply 1 application topically 2 (two) times daily as needed (FOR PSORIASIS).      hydrocortisone 2.5 % cream      levalbuterol (XOPENEX HFA) 45 MCG/ACT inhaler INHALE ONE TO TWO PUFFS BY MOUTH EVERY 4 HOURS AS NEEDED FOR WHEEZING 15 g 2   levothyroxine (SYNTHROID) 50 MCG tablet TAKE ONE TABLET BY MOUTH DAILY WITH BREAKFAST 90 tablet 3   Magnesium 250 MG TABS Take 125 mg by mouth daily.     Multiple Vitamins-Minerals (ONE-A-DAY WOMENS 50+ ADVANTAGE PO) Take by mouth daily.     omeprazole (PRILOSEC) 40 MG capsule Take 1 capsule (  40 mg total) by mouth 2 (two) times daily before a meal. 180 capsule 1   sertraline (ZOLOFT) 25 MG tablet TAKE ONE TABLET BY MOUTH AT BEDTIME 30 tablet 5   triamcinolone (KENALOG) 0.1 % paste      prednisoLONE acetate (PRED FORTE) 1 % ophthalmic suspension Place 1 drop into the right eye daily. Pt takes 2 times per week     No facility-administered medications prior to visit.    Review of Systems  Constitutional:  Negative for chills, diaphoresis, fever, malaise/fatigue and weight loss.  HENT:  Negative for congestion.   Respiratory:  Positive for shortness of breath. Negative for cough,  hemoptysis, sputum production and wheezing.   Cardiovascular:  Negative for chest pain, palpitations and leg swelling.   Objective:   Vitals:   11/18/20 0936  Weight: 119 lb (54 kg)  Height: 5\' 7"  (1.702 m)      Physical Exam: General: Well-appearing, no acute distress HENT: [place], AT Eyes: EOMI, no scleral icterus Respiratory: Clear to auscultation bilaterally.  No crackles, wheezing or rales Cardiovascular: RRR, -M/R/G, no JVD Extremities:-Edema,-tenderness Neuro: AAO x4, CNII-XII grossly intact Psych: Normal mood, normal affect  Data Reviewed:  Imaging: CT Chest 08/01/18- RUL GGO nodule measured 22mm and solid nodule measured 38mm CT Chest 01/30/19 - RUL nodule increased to 52mm. Scattered lung nodules <51mm in left and right upper lobes. CT Chest 02/16/19 - Unchanged prior lung nodules. Interval solid 32mm RUL and multifocal ground glass nodular opacities. CT Chest 01/30/20 - Interval development of solid 10mm nodule in RUL with adjacent smaller nodes and ground glass nodule in RUL 11x17mm. Remaininig nodules unchanged. CT Chest 08/04/20 - New 7 mm nodule in the anterior right lung apex with mild subpleural retraction. Previously seen 5 and 11 mm nodules in the right upper lobe have resolved. New tiny clustered centrilobular nodules in the central right lower lobe, nonspecific resolved CT Chest 11/15/20 - Interval RLL subsolid nodule ~79mm. Similar/slightly decreased lesions in RUL and LUL  PFT: 08/27/2018 FVC 2.78 (78 %) FEV1 1.76 (65 %) Ratio 63 TLC 117 % RV 175% RV/TLC 144% DLCO 107 % Interpretation: Moderate obstructive defect with air trapping.  No significant bronchodilator response.  Normal TLC.  Normal DLCO  Labs: CBC 08/06/18 9.5% Eos with 500 eos. Since 2015 absolute eos range from 400-600. CBC 03/31/2019 3.4% Eos with 200 absolute eos     Assessment & Plan:   Discussion: 70 year old female never smoker with history of childhood asthma with moderate asthma and hx pulmonary nodules  who presents for follow-up. Prior labs with eosinophilia ranging from 400-600 since 2015. CT chest imaging reviewed with waxing and waning pulmonary nodules including new 9 mm nodule on exam suggestive of atypical infection. She is currently asymptomatic. We discussed risks and benefits of surveillance vs diagnostic testing.  Moderate persistent COPD asthma overlap- well-controlled CONTINUE Dulera 200-5 mcg TWO puffs TWICE a day. REFILLED CONTINUE Xopenex inhaler as needed for chest tightness. OK to take before activity. CONTINUE Protonix   Multiple subsolid lung nodules Waxing and waning pulmonary nodules including new 9 mm nodule in right upper lobe. This may be reflection of recent COVID-19 infection however with her history of nodules would recommend at least surveillance imaging vs diagnostic testing via bronchoscopy. She is currently asymptomatic. Will defer invasive testing for now until she is symptomatic --ORDER CT Chest without contrast in 3 months (patient prefers between 9-10 am)   Health Maintenance Immunization History  Administered Date(s) Administered  Fluad Quad(high Dose 65+) 10/09/2018   Influenza Split 10/30/2013   Influenza, High Dose Seasonal PF 10/25/2017, 10/17/2019   Influenza-Unspecified 10/19/2014, 10/19/2015, 10/27/2016   PFIZER(Purple Top)SARS-COV-2 Vaccination 02/12/2019, 03/05/2019, 10/30/2019   Pneumococcal Conjugate-13 11/28/2013   Pneumococcal Polysaccharide-23 03/29/2018   Zoster, Live 02/18/2009   Orders Placed This Encounter  Procedures   CT Chest Wo Contrast    Standing Status:   Future    Standing Expiration Date:   11/18/2021    Scheduling Instructions:     Schedule between 9-10a before 18mo f/u w/JE    Order Specific Question:   Preferred imaging location?    Answer:   [facility] ordered this encounter  Medications   mometasone-formoterol (DULERA) 200-5 MCG/ACT AERO    Sig: Inhale 2 puffs into the lungs in the morning and  at bedtime.    Dispense:  13 g    Refill:  11    Order Specific Question:   Lot Number?    Answer:   [id number]    Order Specific Question:   Expiration Date?    Answer:   08/07/2019    Order Specific Question:   Manufacturer?    Answer:   [employer] [[id number]]    Order Specific Question:   Quantity    Answer:   1    Return in about 3 months (around 02/18/2021).   [place], MD [facility] 11/18/2020 9:40 AM  Office Number [phone]

## 2020-11-22 ENCOUNTER — Encounter: Payer: Self-pay | Admitting: Pulmonary Disease

## 2020-12-13 ENCOUNTER — Telehealth: Payer: Self-pay | Admitting: Internal Medicine

## 2020-12-13 ENCOUNTER — Other Ambulatory Visit: Payer: Self-pay

## 2020-12-13 MED ORDER — OMEPRAZOLE 40 MG PO CPDR: 40.0000 mg | DELAYED_RELEASE_CAPSULE | Freq: Two times a day (BID) | ORAL | 1 refills | Status: DC

## 2020-12-13 NOTE — Telephone Encounter (Signed)
1.Medication Requested: omeprazole (PRILOSEC) 40 MG capsule  2. Pharmacy (Name[id number] - [place], [place]  Phone:  [phone] Fax:  [phone]   3. On Med List: yes  4. Last Visit with PCP: 04.12.22  5. Next visit date with PCP: n/a   Agent: Please be advised that RX refills may take up to 3 business days. We ask that you follow-up with your pharmacy.

## 2020-12-13 NOTE — Telephone Encounter (Signed)
Sent in today 

## 2021-01-03 ENCOUNTER — Encounter: Payer: Self-pay | Admitting: Podiatry

## 2021-01-03 ENCOUNTER — Other Ambulatory Visit: Payer: Self-pay

## 2021-01-03 ENCOUNTER — Ambulatory Visit: Payer: Medicare PPO | Admitting: Podiatry

## 2021-01-03 DIAGNOSIS — B351 Tinea unguium: Secondary | ICD-10-CM | POA: Diagnosis not present

## 2021-01-03 DIAGNOSIS — M79674 Pain in right toe(s): Secondary | ICD-10-CM | POA: Diagnosis not present

## 2021-01-03 DIAGNOSIS — M79675 Pain in left toe(s): Secondary | ICD-10-CM | POA: Diagnosis not present

## 2021-01-06 NOTE — Progress Notes (Signed)
°  Subjective:  Patient ID: Tina Lucas, female    DOB: 1950/10/05,  MRN: [id number]  LAVERNE HURSEY presents to clinic today for painful elongated mycotic toenails 1-5 bilaterally which are tender when wearing enclosed shoe gear. Pain is relieved with periodic professional debridement.  Patient notes no new pedal concerns on today's visit. She continues to enjoy playing Pickleball.  PCP is Binnie Rail, MD , and last visit was 04/19/2020.  Allergies  Allergen Reactions   Singulair [Montelukast Sodium] Other (See Comments)    headaches    Review of Systems: Negative except as noted in the HPI. Objective:   Constitutional ALEXEA BLASE is a pleasant 70 y.o. Caucasian female, WD, WN in NAD. AAO x 3.   Vascular CFT immediate b/l LE. Palpable DP/PT pulses b/l LE. Digital hair present b/l. Skin temperature gradient WNL b/l. No pain with calf compression b/l. No edema noted b/l. No cyanosis or clubbing noted b/l LE.  Neurologic Normal speech. Oriented to person, place, and time. Protective sensation intact 5/5 intact bilaterally with 10g monofilament b/l. Vibratory sensation intact b/l. Proprioception intact bilaterally.  Dermatologic Pedal integument with normal turgor, texture and tone b/l LE. No open wounds b/l. No interdigital macerations b/l. Toenails 1-5 b/l elongated, thickened, discolored with subungual debris. +Tenderness with dorsal palpation of nailplates. No hyperkeratotic or porokeratotic lesions present.  Orthopedic: Normal muscle strength 5/5 to all lower extremity muscle groups bilaterally. No pain, crepitus or joint limitation noted with ROM b/l LE. No gross bony pedal deformities b/l. Patient ambulates independently without assistive aids.   Radiographs: None   Assessment:   1. Pain due to onychomycosis of toenails of both feet    Plan:  Patient was evaluated and treated and all questions answered. Consent given for treatment as described below: -No new findings. No new  orders. -Patient to continue soft, supportive shoe gear daily. -Mycotic toenails 1-5 bilaterally were debrided in length and girth with sterile nail nippers and dremel without incident. -Patient/POA to call should there be question/concern in the interim.  Return in about 4 months (around 05/04/2021).  Marzetta Board, DPM

## 2021-02-10 ENCOUNTER — Ambulatory Visit: Payer: Medicare PPO | Admitting: Pulmonary Disease

## 2021-02-15 ENCOUNTER — Other Ambulatory Visit: Payer: Self-pay

## 2021-02-15 ENCOUNTER — Ambulatory Visit (INDEPENDENT_AMBULATORY_CARE_PROVIDER_SITE_OTHER)
Admission: RE | Admit: 2021-02-15 | Discharge: 2021-02-15 | Disposition: A | Discharge status: Home / Self Care | Payer: Medicare PPO | Source: Ambulatory Visit | Attending: Pulmonary Disease | Admitting: Pulmonary Disease

## 2021-02-15 DIAGNOSIS — I700 Atherosclerosis of aorta: Secondary | ICD-10-CM | POA: Diagnosis not present

## 2021-02-15 DIAGNOSIS — R918 Other nonspecific abnormal finding of lung field: Secondary | ICD-10-CM

## 2021-02-15 DIAGNOSIS — J9811 Atelectasis: Secondary | ICD-10-CM | POA: Diagnosis not present

## 2021-02-15 DIAGNOSIS — R911 Solitary pulmonary nodule: Secondary | ICD-10-CM | POA: Diagnosis not present

## 2021-02-24 ENCOUNTER — Telehealth: Payer: Self-pay | Admitting: Pulmonary Disease

## 2021-02-24 NOTE — Telephone Encounter (Signed)
Please update patient on CT results:  The 1 cm nodule in the right upper lung has resolved. You have a small 77mm nodule that is stable and likely benign. I recommend no further imaging at this time since this has improved.  Let's plan to follow-up in April to check in for your asthma-COPD.

## 2021-02-25 ENCOUNTER — Telehealth: Payer: Self-pay | Admitting: Pulmonary Disease

## 2021-02-25 NOTE — Telephone Encounter (Signed)
LMTCB for results and to schedule appt

## 2021-02-25 NOTE — Telephone Encounter (Signed)
Please see 02/24/2021 telephone message.   Please update patient on CT results:   The 1 cm nodule in the right upper lung has resolved. You have a small 42mm nodule that is stable and likely benign. I recommend no further imaging at this time since this has improved.   Let's plan to follow-up in April to check in for your asthma-COPD.  Patient is aware of results and voiced her understanding.  Appt scheduled 04/25/2021 at 10:00. Nothing further needed at this time,

## 2021-03-14 ENCOUNTER — Telehealth: Payer: Self-pay | Admitting: Internal Medicine

## 2021-03-14 NOTE — Telephone Encounter (Signed)
LVM for pt to rtn my call to schedule AWV with [facility]. Please schedule AWV if pt calls the office  

## 2021-03-30 ENCOUNTER — Ambulatory Visit (INDEPENDENT_AMBULATORY_CARE_PROVIDER_SITE_OTHER): Payer: Medicare PPO

## 2021-03-30 DIAGNOSIS — Z0000 Encounter for general adult medical examination without abnormal findings: Secondary | ICD-10-CM

## 2021-03-30 NOTE — Progress Notes (Signed)
I connected with Tina Lucas today by telephone and verified that I am speaking with the correct person using two identifiers. Location patient: home Location provider: work Persons participating in the virtual visit: patient, provider.   I discussed the limitations, risks, security and privacy concerns of performing an evaluation and management service by telephone and the availability of in person appointments. I also discussed with the patient that there may be a patient responsible charge related to this service. The patient expressed understanding and verbally consented to this telephonic visit.    Interactive audio and video telecommunications were attempted between this provider and patient, however failed, due to patient having technical difficulties OR patient did not have access to video capability.  We continued and completed visit with audio only.  Some vital signs may be absent or patient reported.   Time Spent with patient on telephone encounter: 40 minutes  Subjective:   Tina Lucas is a 71 y.o. female who presents for Medicare Annual (Subsequent) preventive examination.  Review of Systems     Cardiac Risk Factors include: advanced age (>40mn, >>24women);family history of premature cardiovascular disease     Objective:    There were no vitals filed for this visit. There is no height or weight on file to calculate BMI.  Advanced Directives 03/30/2021 03/25/2020 07/17/2019 09/13/2017 02/21/2017 04/21/2016 04/21/2016  Does Patient Have a Medical Advance Directive? Yes Yes Yes Yes Yes Yes -  Type of Advance Directive Living will;Healthcare Power of Attorney;Out of facility DNR (pink MOST or yellow form) [place] will - [place]Living will [place] will  Does patient want to make changes to medical advance directive? No -  Patient declined No - Patient declined - - - No - Patient declined -  Copy of [place] Chart? No - copy requested No - copy requested - No - copy requested - No - copy requested -    Current Medications (verified) Outpatient Encounter Medications as of 03/30/2021  Medication Sig   Calcium Carbonate-Vitamin D 600-400 MG-UNIT tablet Take 1 tablet by mouth daily.   cholecalciferol (VITAMIN D) 400 UNITS TABS tablet Take 400 Units by mouth.   diclofenac Sodium (VOLTAREN) 1 % GEL    fexofenadine (ALLEGRA) 180 MG tablet Take 180 mg by mouth daily.   fluocinonide cream (LIDEX) 02.99% Apply 1 application topically 2 (two) times daily as needed (FOR PSORIASIS).    hydrocortisone 2.5 % cream    levalbuterol (XOPENEX HFA) 45 MCG/ACT inhaler INHALE ONE TO TWO PUFFS BY MOUTH EVERY 4 HOURS AS NEEDED FOR WHEEZING   levothyroxine (SYNTHROID) 50 MCG tablet TAKE ONE TABLET BY MOUTH DAILY WITH BREAKFAST   Magnesium 250 MG TABS Take 125 mg by mouth daily.   mometasone-formoterol (DULERA) 200-5 MCG/ACT AERO Inhale 2 puffs into the lungs in the morning and at bedtime.   Multiple Vitamins-Minerals (ONE-A-DAY WOMENS 50+ ADVANTAGE PO) Take by mouth daily.   sertraline (ZOLOFT) 25 MG tablet TAKE ONE TABLET BY MOUTH AT BEDTIME   triamcinolone (KENALOG) 0.1 % paste    [DISCONTINUED] celecoxib (CELEBREX) 100 MG capsule TAKE ONE CAPSULE BY MOUTH TWICE A DAY   [DISCONTINUED] omeprazole (PRILOSEC) 40 MG capsule Take 1 capsule (40 mg total) by mouth 2 (two) times daily before a meal.   No facility-administered encounter medications on file as of 03/30/2021.    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 STUDY N/A 08/13/2019   Procedure: 24 HOUR PH STUDY- impedence;  Surgeon: Irving Copas., MD;  Location: [facility];  Service: Gastroenterology;  Laterality: N/A;   COLONOSCOPY     ESOPHAGEAL MANOMETRY N/A 08/13/2019   Procedure: ESOPHAGEAL MANOMETRY (EM);  Surgeon: Irving Copas., MD;  Location: [facility];  Service: Gastroenterology;  Laterality: N/A;   [place] IMPEDANCE STUDY  08/13/2019   Procedure: [place] IMPEDANCE STUDY;  Surgeon: Rush Landmark, Telford Nab., MD;  Location: [facility];  Service: Gastroenterology;;   polyp on vocal chord      TONSILLECTOMY  1958   Family History  Problem Relation Age of Onset   Breast cancer Mother 2   Osteoarthritis Father    Hypertension Father    Leukemia Maternal Aunt 45   Atrial fibrillation Brother    Colon cancer Neg Hx    Esophageal cancer Neg Hx    Inflammatory bowel disease Neg Hx    Liver disease Neg Hx    Pancreatic cancer Neg Hx    Rectal cancer Neg Hx    Stomach cancer Neg Hx    Social History   Socioeconomic History   Marital status: Married    Spouse name: Not on file   Number of children: Not on file   Years of education: Not on file   Highest education level: Not on file  Occupational History   Not on file  Tobacco Use   Smoking status: Never   Smokeless tobacco: Never  Vaping Use   Vaping Use: Never used  Substance and Sexual Activity   Alcohol use: Not Currently    Alcohol/week: 0.0 standard drinks   Drug use: No   Sexual activity: Not on file  Other Topics Concern   Not on file  Social History Narrative   Lives with husband in a one story home.  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   Social Determinants of Health   Financial Resource Strain: Low Risk     Difficulty of Paying Living Expenses: Not hard at all  Food Insecurity: No Food Insecurity   Worried About Charity fundraiser in the Last Year: Never true   [place] in the Last Year: Never true  Transportation Needs: No Transportation Needs   Lack of Transportation (Medical): No   Lack of Transportation (Non-Medical): No  Physical Activity: Sufficiently Active   Days of Exercise per Week: 7 days   Minutes of Exercise per Session: 60 min  Stress: No Stress Concern Present   Feeling of Stress : Not at all  Social Connections: Socially Integrated   Frequency of Communication with Friends and Family: More than three times a week   Frequency of Social Gatherings with Friends and Family: More than three times a week   Attends Religious Services: More than 4 times per year   Active Member of [employer] or Organizations: Yes  Attends Music therapist: More than 4 times per year   Marital Status: Married    Tobacco Counseling Counseling given: Not Answered   Clinical Intake:  Pre-visit preparation completed: Yes  Pain : No/denies pain     Nutritional Risks: None  How often do you need to have someone help you when you read instructions, pamphlets, or other written materials from your doctor or pharmacy?: 1 - Never What is the last grade level you completed in school?: 2 Masters Degrees from [employer] (Health & Physical Education; Exercise Physicology, Media & Technology)  Diabetic? no     Information entered by :: Lisette Abu, LPN   Activities of Daily Living In your present state of health, do you have any difficulty performing the following activities: 03/30/2021  Hearing? N  Vision? N  Difficulty concentrating or making decisions? N  Walking or climbing stairs? N  Dressing or bathing? N  Doing errands, shopping? N  Preparing Food and eating ? N  Using the Toilet? N  In the past six months, have you accidently leaked urine? N  Do you have problems  with loss of bowel control? N  Managing your Medications? N  Managing your Finances? N  Housekeeping or managing your Housekeeping? N  Some recent data might be hidden    Patient Care Team: Binnie Rail, MD as PCP - General (Internal Medicine) [place], Amy, MD (Dermatology) Clent Jacks, MD as Consulting Physician (Ophthalmology)  Indicate any recent Medical Services you may have received from other than [facility] providers in the past year (date may be approximate).     Assessment:   This is a routine wellness examination for [place].  Hearing/Vision screen Hearing Screening - Comments:: Patient denied any hearing difficulty.   No hearing aids.  Vision Screening - Comments:: Patient does wear corrective lenses/contacts.   [facility] exam done by: [facility]  Dietary issues and exercise activities discussed: Current Exercise Habits: Home exercise routine;Structured exercise class, Time (Minutes): 60, Frequency (Times/Week): 7, Weekly Exercise (Minutes/Week): 420, Intensity: Moderate, Exercise limited by: respiratory conditions(s)   Goals Addressed   None   Depression Screen PHQ 2/9 Scores 03/30/2021 08/09/2020 03/25/2020 03/31/2019 03/07/2017 02/02/2016  PHQ - 2 Score 0 0 0 0 0 0    Fall Risk Fall Risk  03/30/2021 08/09/2020 03/25/2020 03/31/2019 03/07/2017  Falls in the past year? 0 0 0 0 No  Number falls in past yr: 0 0 0 0 -  Injury with Fall? 0 0 0 0 -  Risk for fall due to : No Fall Risks No Fall Risks No Fall Risks - -  Follow up Falls evaluation completed Falls evaluation completed Falls evaluation completed - -    FALL RISK PREVENTION PERTAINING TO THE HOME:  Any stairs in or around the home? Yes  If so, are there any without handrails? No  Home free of loose throw rugs in walkways, pet beds, electrical cords, etc? Yes  Adequate lighting in your home to reduce risk of falls? Yes   ASSISTIVE DEVICES UTILIZED TO PREVENT FALLS:  Life alert? No  Use of a cane, walker or w/c? No  Grab  bars in the bathroom? No  Shower chair or bench in shower? No  Elevated toilet seat or a handicapped toilet? No   TIMED UP AND GO:  Was the test performed? No .  Length of time to ambulate 10 feet: 0 sec.   Gait steady and fast without use of assistive device  Cognitive  Function: Normal cognitive status assessed by direct observation by this Nurse Health Advisor. No abnormalities found.          Immunizations Immunization History  Administered Date(s) Administered   Fluad Quad(high Dose 65+) 10/09/2018, 10/28/2020   Influenza Split 10/30/2013   Influenza, High Dose Seasonal PF 10/25/2017, 10/17/2019   Influenza-Unspecified 10/19/2014, 10/19/2015, 10/27/2016   PFIZER(Purple Top)SARS-COV-2 Vaccination 02/12/2019, 03/05/2019, 10/30/2019   Pfizer Covid-19 Vaccine Bivalent Booster 49yr & up 11/23/2020   Pneumococcal Conjugate-13 11/28/2013   Pneumococcal Polysaccharide-23 03/29/2018   Zoster, Live 02/18/2009    TDAP status: Due, Education has been provided regarding the importance of this vaccine. Advised may receive this vaccine at local pharmacy or Health Dept. Aware to provide a copy of the vaccination record if obtained from local pharmacy or Health Dept. Verbalized acceptance and understanding.  Flu Vaccine status: Up to date  Pneumococcal vaccine status: Up to date  Covid-19 vaccine status: Completed vaccines  Qualifies for Shingles Vaccine? Yes   Zostavax completed Yes   Shingrix Completed?: No.    Education has been provided regarding the importance of this vaccine. Patient has been advised to call insurance company to determine out of pocket expense if they have not yet received this vaccine. Advised may also receive vaccine at local pharmacy or Health Dept. Verbalized acceptance and understanding.  Screening Tests Health Maintenance  Topic Date Due   TETANUS/TDAP  Never done   Zoster Vaccines- Shingrix (1 of 2) Never done   DEXA SCAN  03/28/2021   MAMMOGRAM   05/07/2022   COLONOSCOPY (Pts 45-462yrInsurance coverage will need to be confirmed)  06/25/2028   Pneumonia Vaccine 6578Years old  Completed   INFLUENZA VACCINE  Completed   COVID-19 Vaccine  Completed   Hepatitis C Screening  Completed   HPV VACCINES  Aged Out   Fecal DNA (Cologuard)  Discontinued    Health Maintenance  Health Maintenance Due  Topic Date Due   TETANUS/TDAP  Never done   Zoster Vaccines- Shingrix (1 of 2) Never done   DEXA SCAN  03/28/2021    Colorectal cancer screening: Type of screening: Colonoscopy. Completed 06/26/2018. Repeat every 10 years  Mammogram status: Completed 05/06/2020. Repeat every year  Bone Density status: Completed 03/29/2018. Results reflect: Bone density results: OSTEOPENIA. Repeat every 3 years.  Lung Cancer Screening: (Low Dose CT Chest recommended if Age 72-80ears, 30 pack-year currently smoking OR have quit w/in 15years.) does not qualify.   Lung Cancer Screening Referral: no  Additional Screening:  Hepatitis C Screening: does qualify; Completed yes  Vision Screening: Recommended annual ophthalmology exams for early detection of glaucoma and other disorders of the eye. Is the patient up to date with their annual eye exam?  Yes  Who is the provider or what is the name of the office in which the patient attends annual eye exams? [facility] If pt is not established with a provider, would they like to be referred to a provider to establish care? No .   Dental Screening: Recommended annual dental exams for proper oral hygiene  Community Resource Referral / Chronic Care Management: CRR required this visit?  No   CCM required this visit?  No      Plan:     I have personally reviewed and noted the following in the patients chart:   Medical and social history Use of alcohol, tobacco or illicit drugs  Current medications and supplements including opioid prescriptions.  Functional ability and status Nutritional  status Physical activity Advanced directives List of other physicians Hospitalizations, surgeries, and ER visits in previous 12 months Vitals Screenings to include cognitive, depression, and falls Referrals and appointments  In addition, I have reviewed and discussed with patient certain preventive protocols, quality metrics, and best practice recommendations. A written personalized care plan for preventive services as well as general preventive health recommendations were provided to patient.     Sheral Flow, LPN   02/27/869   Nurse Notes:  Patient is cogitatively intact. There were no vitals filed for this visit. There is no height or weight on file to calculate BMI. Patient stated that she has no issues with gait or balance; does not use any assistive devices. Medications reviewed with patient; no opioid use noted.

## 2021-03-30 NOTE — Patient Instructions (Signed)
Tina Lucas , Thank you for taking time to come for your Medicare Wellness Visit. I appreciate your ongoing commitment to your health goals. Please review the following plan we discussed and let me know if I can assist you in the future.   Screening recommendations/referrals: Colonoscopy: 06/26/2018; due every 10 years Mammogram: 05/06/2020; due every 1-2 years; family history of breast cancer Bone Density: 03/29/2018; due every 3 years Recommended yearly ophthalmology/optometry visit for glaucoma screening and checkup Recommended yearly dental visit for hygiene and checkup  Vaccinations: Influenza vaccine: 10/28/2020 Pneumococcal vaccine: 11/28/2013, 03/29/2018 Tdap vaccine: never done Shingles vaccine: never done   Covid-19: 02/12/2019, 03/05/2019, 10/30/2019, 11/23/2020  Advanced directives: Please bring a copy of your health care power of attorney and living will to the office at your convenience.  Conditions/risks identified: Yes; Client understands the importance of follow-up appointments with providers by attending scheduled visits and discussed goals to eat healthier, increase physical activity 5 times a week for 30 minutes each, exercise the brain by doing stimulating brain exercises (reading, adult coloring, crafting, listening to music, puzzles, etc.), socialize and enjoy life more, get enough sleep at least 8-9 hours average per night and make time for laughter.  Next appointment: Please schedule your next Medicare Wellness Visit with your Nurse Health Advisor in 1 year by calling [phone].   Preventive Care 40 Years and Older, Female Preventive care refers to lifestyle choices and visits with your health care provider that can promote health and wellness. What does preventive care include? A yearly physical exam. This is also called an annual well check. Dental exams once or twice a year. Routine eye exams. Ask your health care provider how often you should have your eyes  checked. Personal lifestyle choices, including: Daily care of your teeth and gums. Regular physical activity. Eating a healthy diet. Avoiding tobacco and drug use. Limiting alcohol use. Practicing safe sex. Taking low-dose aspirin every day. Taking vitamin and mineral supplements as recommended by your health care provider. What happens during an annual well check? The services and screenings done by your health care provider during your annual well check will depend on your age, overall health, lifestyle risk factors, and family history of disease. Counseling  Your health care provider may ask you questions about your: Alcohol use. Tobacco use. Drug use. Emotional well-being. Home and relationship well-being. Sexual activity. Eating habits. History of falls. Memory and ability to understand (cognition). Work and work Statistician. Reproductive health. Screening  You may have the following tests or measurements: Height, weight, and BMI. Blood pressure. Lipid and cholesterol levels. These may be checked every 5 years, or more frequently if you are over 53 years old. Skin check. Lung cancer screening. You may have this screening every year starting at age 28 if you have a 30-pack-year history of smoking and currently smoke or have quit within the past 15 years. Fecal occult blood test (FOBT) of the stool. You may have this test every year starting at age 30. Flexible sigmoidoscopy or colonoscopy. You may have a sigmoidoscopy every 5 years or a colonoscopy every 10 years starting at age 3. Hepatitis C blood test. Hepatitis B blood test. Sexually transmitted disease (STD) testing. Diabetes screening. This is done by checking your blood sugar (glucose) after you have not eaten for a while (fasting). You may have this done every 1-3 years. Bone density scan. This is done to screen for osteoporosis. You may have this done starting at age 33. Mammogram. This may be done  every 1-2  years. Talk to your health care provider about how often you should have regular mammograms. Talk with your health care provider about your test results, treatment options, and if necessary, the need for more tests. Vaccines  Your health care provider may recommend certain vaccines, such as: Influenza vaccine. This is recommended every year. Tetanus, diphtheria, and acellular pertussis (Tdap, Td) vaccine. You may need a Td booster every 10 years. Zoster vaccine. You may need this after age 12. Pneumococcal 13-valent conjugate (PCV13) vaccine. One dose is recommended after age 56. Pneumococcal polysaccharide (PPSV23) vaccine. One dose is recommended after age 70. Talk to your health care provider about which screenings and vaccines you need and how often you need them. This information is not intended to replace advice given to you by your health care provider. Make sure you discuss any questions you have with your health care provider. Document Released: 02/05/2015 Document Revised: 09/29/2015 Document Reviewed: 11/10/2014 Elsevier Interactive Patient Education  2017 [employer] Prevention in the Home Falls can cause injuries. They can happen to people of all ages. There are many things you can do to make your home safe and to help prevent falls. What can I do on the outside of my home? Regularly fix the edges of walkways and driveways and fix any cracks. Remove anything that might make you trip as you walk through a door, such as a raised step or threshold. Trim any bushes or trees on the path to your home. Use bright outdoor lighting. Clear any walking paths of anything that might make someone trip, such as rocks or tools. Regularly check to see if handrails are loose or broken. Make sure that both sides of any steps have handrails. Any raised decks and porches should have guardrails on the edges. Have any leaves, snow, or ice cleared regularly. Use sand or salt on walking paths  during winter. Clean up any spills in your garage right away. This includes oil or grease spills. What can I do in the bathroom? Use night lights. Install grab bars by the toilet and in the tub and shower. Do not use towel bars as grab bars. Use non-skid mats or decals in the tub or shower. If you need to sit down in the shower, use a plastic, non-slip stool. Keep the floor dry. Clean up any water that spills on the floor as soon as it happens. Remove soap buildup in the tub or shower regularly. Attach bath mats securely with double-sided non-slip rug tape. Do not have throw rugs and other things on the floor that can make you trip. What can I do in the bedroom? Use night lights. Make sure that you have a light by your bed that is easy to reach. Do not use any sheets or blankets that are too big for your bed. They should not hang down onto the floor. Have a firm chair that has side arms. You can use this for support while you get dressed. Do not have throw rugs and other things on the floor that can make you trip. What can I do in the kitchen? Clean up any spills right away. Avoid walking on wet floors. Keep items that you use a lot in easy-to-reach places. If you need to reach something above you, use a strong step stool that has a grab bar. Keep electrical cords out of the way. Do not use floor polish or wax that makes floors slippery. If you must use wax, use  non-skid floor wax. Do not have throw rugs and other things on the floor that can make you trip. What can I do with my stairs? Do not leave any items on the stairs. Make sure that there are handrails on both sides of the stairs and use them. Fix handrails that are broken or loose. Make sure that handrails are as long as the stairways. Check any carpeting to make sure that it is firmly attached to the stairs. Fix any carpet that is loose or worn. Avoid having throw rugs at the top or bottom of the stairs. If you do have throw  rugs, attach them to the floor with carpet tape. Make sure that you have a light switch at the top of the stairs and the bottom of the stairs. If you do not have them, ask someone to add them for you. What else can I do to help prevent falls? Wear shoes that: Do not have high heels. Have rubber bottoms. Are comfortable and fit you well. Are closed at the toe. Do not wear sandals. If you use a stepladder: Make sure that it is fully opened. Do not climb a closed stepladder. Make sure that both sides of the stepladder are locked into place. Ask someone to hold it for you, if possible. Clearly mark and make sure that you can see: Any grab bars or handrails. First and last steps. Where the edge of each step is. Use tools that help you move around (mobility aids) if they are needed. These include: Canes. Walkers. Scooters. Crutches. Turn on the lights when you go into a dark area. Replace any light bulbs as soon as they burn out. Set up your furniture so you have a clear path. Avoid moving your furniture around. If any of your floors are uneven, fix them. If there are any pets around you, be aware of where they are. Review your medicines with your doctor. Some medicines can make you feel dizzy. This can increase your chance of falling. Ask your doctor what other things that you can do to help prevent falls. This information is not intended to replace advice given to you by your health care provider. Make sure you discuss any questions you have with your health care provider. Document Released: 11/05/2008 Document Revised: 06/17/2015 Document Reviewed: 02/13/2014 Elsevier Interactive Patient Education  2017 [employer].

## 2021-04-01 ENCOUNTER — Encounter: Payer: Medicare PPO | Admitting: Internal Medicine

## 2021-04-03 ENCOUNTER — Encounter: Payer: Self-pay | Admitting: Internal Medicine

## 2021-04-03 NOTE — Progress Notes (Unsigned)
Subjective:    Patient ID: Tina Lucas, female    DOB: 1950/12/27, 71 y.o.   MRN: [id number]   This visit occurred during the SARS-CoV-2 public health emergency.  Safety protocols were in place, including screening questions prior to the visit, additional usage of staff PPE, and extensive cleaning of exam room while observing appropriate contact time as indicated for disinfecting solutions.    HPI Tina Lucas is here for No chief complaint on file.    Medications and allergies reviewed with patient and updated if appropriate.    Current Outpatient Medications on File Prior to Visit  Medication Sig Dispense Refill   Calcium Carbonate-Vitamin D 600-400 MG-UNIT tablet Take 1 tablet by mouth daily.     cholecalciferol (VITAMIN D) 400 UNITS TABS tablet Take 400 Units by mouth.     diclofenac Sodium (VOLTAREN) 1 % GEL      fexofenadine (ALLEGRA) 180 MG tablet Take 180 mg by mouth daily.     fluocinonide cream (LIDEX) 3.81 % Apply 1 application topically 2 (two) times daily as needed (FOR PSORIASIS).      hydrocortisone 2.5 % cream      levalbuterol (XOPENEX HFA) 45 MCG/ACT inhaler INHALE ONE TO TWO PUFFS BY MOUTH EVERY 4 HOURS AS NEEDED FOR WHEEZING 15 g 2   levothyroxine (SYNTHROID) 50 MCG tablet TAKE ONE TABLET BY MOUTH DAILY WITH BREAKFAST 90 tablet 3   Magnesium 250 MG TABS Take 125 mg by mouth daily.     mometasone-formoterol (DULERA) 200-5 MCG/ACT AERO Inhale 2 puffs into the lungs in the morning and at bedtime. 13 g 11   Multiple Vitamins-Minerals (ONE-A-DAY WOMENS 50+ ADVANTAGE PO) Take by mouth daily.     sertraline (ZOLOFT) 25 MG tablet TAKE ONE TABLET BY MOUTH AT BEDTIME 30 tablet 5   triamcinolone (KENALOG) 0.1 % paste      No current facility-administered medications on file prior to visit.    Review of Systems     Objective:  There were no vitals filed for this visit. There were no vitals filed for this visit. There is no height or weight on file to calculate  BMI.  BP Readings from Last 3 Encounters:  11/18/20 122/82  05/04/20 134/72  03/31/20 140/70    Wt Readings from Last 3 Encounters:  11/18/20 119 lb (54 kg)  08/09/20 118 lb (53.5 kg)  05/04/20 119 lb 12.8 oz (54.3 kg)    Depression screen [facility] 2/9 03/30/2021 08/09/2020 03/25/2020 03/31/2019 03/07/2017  Decreased Interest 0 0 0 0 0  Down, Depressed, Hopeless 0 0 0 0 0  PHQ - 2 Score 0 0 0 0 0  Some recent data might be hidden     No flowsheet data found.      Physical Exam Constitutional: She appears well-developed and well-nourished. No distress.  HENT:  Head: Normocephalic and atraumatic.  Right Ear: External ear normal. Normal ear canal and TM Left Ear: External ear normal.  Normal ear canal and TM Mouth/Throat: Oropharynx is clear and moist.  Eyes: Conjunctivae and EOM are normal.  Neck: Neck supple. No tracheal deviation present. No thyromegaly present.  No carotid bruit  Cardiovascular: Normal rate, regular rhythm and normal heart sounds.   No murmur heard.  No edema. Pulmonary/Chest: Effort normal and breath sounds normal. No respiratory distress. She has no wheezes. She has no rales.  Breast: deferred   Abdominal: Soft. She exhibits no distension. There is no tenderness.  Lymphadenopathy: She has no cervical adenopathy.  Skin: Skin is warm and dry. She is not diaphoretic.  Psychiatric: She has a normal mood and affect. Her behavior is normal.     Lab Results  Component Value Date   WBC 4.5 03/31/2020   HGB 14.8 03/31/2020   HCT 42.6 03/31/2020   PLT 222.0 03/31/2020   GLUCOSE 88 03/31/2020   CHOL 197 03/31/2020   TRIG 47.0 03/31/2020   HDL 84.40 03/31/2020   LDLCALC 103 (H) 03/31/2020   ALT 50 (H) 03/31/2020   AST 32 03/31/2020   NA 139 03/31/2020   K 4.0 03/31/2020   CL 103 03/31/2020   CREATININE 0.70 03/31/2020   BUN 11 03/31/2020   CO2 30 03/31/2020   TSH 2.41 03/31/2020   INR 1.0 12/15/2014   HGBA1C 5.1 03/07/2017         Assessment &  Plan:   Physical exam: Screening blood work  ordered Exercise   Weight   Substance abuse  none   Reviewed recommended immunizations.   Health Maintenance  Topic Date Due   TETANUS/TDAP  Never done   Zoster Vaccines- Shingrix (1 of 2) Never done   DEXA SCAN  03/28/2021   MAMMOGRAM  05/07/2022   COLONOSCOPY (Pts 45-73yr Insurance coverage will need to be confirmed)  06/25/2028   Pneumonia Vaccine 72 Years old  Completed   INFLUENZA VACCINE  Completed   COVID-19 Vaccine  Completed   Hepatitis C Screening  Completed   HPV VACCINES  Aged Out   Fecal DNA (Cologuard)  Discontinued          See Problem List for Assessment and Plan of chronic medical problems.

## 2021-04-05 ENCOUNTER — Encounter: Payer: Medicare PPO | Admitting: Internal Medicine

## 2021-04-06 ENCOUNTER — Other Ambulatory Visit: Payer: Self-pay

## 2021-04-06 ENCOUNTER — Ambulatory Visit (INDEPENDENT_AMBULATORY_CARE_PROVIDER_SITE_OTHER): Payer: Medicare PPO | Admitting: Internal Medicine

## 2021-04-06 VITALS — BP 140/80 | HR 65 | Temp 97.7°F | Ht 67.0 in | Wt 116.0 lb

## 2021-04-06 DIAGNOSIS — E039 Hypothyroidism, unspecified: Secondary | ICD-10-CM | POA: Diagnosis not present

## 2021-04-06 DIAGNOSIS — R030 Elevated blood-pressure reading, without diagnosis of hypertension: Secondary | ICD-10-CM | POA: Diagnosis not present

## 2021-04-06 DIAGNOSIS — Z0000 Encounter for general adult medical examination without abnormal findings: Secondary | ICD-10-CM

## 2021-04-06 DIAGNOSIS — D692 Other nonthrombocytopenic purpura: Secondary | ICD-10-CM | POA: Diagnosis not present

## 2021-04-06 DIAGNOSIS — F419 Anxiety disorder, unspecified: Secondary | ICD-10-CM

## 2021-04-06 DIAGNOSIS — M4180 Other forms of scoliosis, site unspecified: Secondary | ICD-10-CM | POA: Insufficient documentation

## 2021-04-06 DIAGNOSIS — K219 Gastro-esophageal reflux disease without esophagitis: Secondary | ICD-10-CM

## 2021-04-06 DIAGNOSIS — J449 Chronic obstructive pulmonary disease, unspecified: Secondary | ICD-10-CM

## 2021-04-06 DIAGNOSIS — M85859 Other specified disorders of bone density and structure, unspecified thigh: Secondary | ICD-10-CM

## 2021-04-06 LAB — LIPID PANEL
Cholesterol: 209 mg/dL — ABNORMAL HIGH (ref 0–200)
HDL: 91.6 mg/dL (ref >39.00)
LDL Cholesterol: 108 mg/dL — ABNORMAL HIGH (ref 0–99)
NonHDL: 117.4
Total CHOL/HDL Ratio: 2
Triglycerides: 47 mg/dL (ref 0.0–149.0)
VLDL: 9.4 mg/dL (ref 0.0–40.0)

## 2021-04-06 LAB — COMPREHENSIVE METABOLIC PANEL
ALT: 25 U/L (ref 0–35)
AST: 26 U/L (ref 0–37)
Albumin: 4.6 g/dL (ref 3.5–5.2)
Alkaline Phosphatase: 74 U/L (ref 39–117)
BUN: 11 mg/dL (ref 6–23)
CO2: 30 mEq/L (ref 19–32)
Calcium: 9.6 mg/dL (ref 8.4–10.5)
Chloride: 99 mEq/L (ref 96–112)
Creatinine, Ser: 0.73 mg/dL (ref 0.40–1.20)
GFR: 82.56 mL/min (ref >60.00)
Glucose, Bld: 100 mg/dL — ABNORMAL HIGH (ref 70–99)
Potassium: 3.9 mEq/L (ref 3.5–5.1)
Sodium: 136 mEq/L (ref 135–145)
Total Bilirubin: 0.8 mg/dL (ref 0.2–1.2)
Total Protein: 6.8 g/dL (ref 6.0–8.3)

## 2021-04-06 LAB — CBC WITH DIFFERENTIAL/PLATELET
Basophils Absolute: 0.1 10*3/uL (ref 0.0–0.1)
Basophils Relative: 1.4 % (ref 0.0–3.0)
Eosinophils Absolute: 0.4 10*3/uL (ref 0.0–0.7)
Eosinophils Relative: 8.3 % — ABNORMAL HIGH (ref 0.0–5.0)
HCT: 42.4 % (ref 36.0–46.0)
Hemoglobin: 14.6 g/dL (ref 12.0–15.0)
Lymphocytes Relative: 17 % (ref 12.0–46.0)
Lymphs Abs: 0.9 10*3/uL (ref 0.7–4.0)
MCHC: 34.5 g/dL (ref 30.0–36.0)
MCV: 92.1 fl (ref 78.0–100.0)
Monocytes Absolute: 0.6 10*3/uL (ref 0.1–1.0)
Monocytes Relative: 11.3 % (ref 3.0–12.0)
Neutro Abs: 3.2 10*3/uL (ref 1.4–7.7)
Neutrophils Relative %: 62 % (ref 43.0–77.0)
Platelets: 230 10*3/uL (ref 150.0–400.0)
RBC: 4.6 Mil/uL (ref 3.87–5.11)
RDW: 13 % (ref 11.5–15.5)
WBC: 5.1 10*3/uL (ref 4.0–10.5)

## 2021-04-06 LAB — TSH: TSH: 2.39 u[IU]/mL (ref 0.35–5.50)

## 2021-04-06 LAB — VITAMIN D 25 HYDROXY (VIT D DEFICIENCY, FRACTURES): VITD: 66.7 ng/mL (ref 30.00–100.00)

## 2021-04-06 NOTE — Assessment & Plan Note (Addendum)
Chronic ?Following with pulmonary-Dr. Loanne Drilling ?Currently well controlled ?Using dulera 2 puffs twice daily, xopenex daily prn prior to pickleball - usually TIW ?

## 2021-04-06 NOTE — Assessment & Plan Note (Addendum)
Chronic ?GERD controlled ?Continue omeprazole 40 mg daily ?Taking probiotic, multi-enzyme daily ?She will try to eventually taper off the omeprazole slowly ? ?

## 2021-04-06 NOTE — Assessment & Plan Note (Signed)
White coat htn ?Well controlled at home ?No medication needed ?

## 2021-04-06 NOTE — Patient Instructions (Addendum)
? ? ?Blood work was ordered.   ? ? ?Medications changes include :   none ? ? ?Your prescription(s) have been sent to your pharmacy.  ? ? ? ?Return in about 1 year (around 04/07/2022) for CPE, Schedule DEXA-[place]. ? ? ?Health Maintenance, Female ?Adopting a healthy lifestyle and getting preventive care are important in promoting health and wellness. Ask your health care provider about: ?The right schedule for you to have regular tests and exams. ?Things you can do on your own to prevent diseases and keep yourself healthy. ?What should I know about diet, weight, and exercise? ?Eat a healthy diet ? ?Eat a diet that includes plenty of vegetables, fruits, low-fat dairy products, and lean protein. ?Do not eat a lot of foods that are high in solid fats, added sugars, or sodium. ?Maintain a healthy weight ?Body mass index (BMI) is used to identify weight problems. It estimates body fat based on height and weight. Your health care provider can help determine your BMI and help you achieve or maintain a healthy weight. ?Get regular exercise ?Get regular exercise. This is one of the most important things you can do for your health. Most adults should: ?Exercise for at least 150 minutes each week. The exercise should increase your heart rate and make you sweat (moderate-intensity exercise). ?Do strengthening exercises at least twice a week. This is in addition to the moderate-intensity exercise. ?Spend less time sitting. Even light physical activity can be beneficial. ?Watch cholesterol and blood lipids ?Have your blood tested for lipids and cholesterol at 72 years of age, then have this test every 5 years. ?Have your cholesterol levels checked more often if: ?Your lipid or cholesterol levels are high. ?You are older than 72 years of age. ?You are at high risk for heart disease. ?What should I know about cancer screening? ?Depending on your health history and family history, you may need to have cancer screening at various ages.  This may include screening for: ?Breast cancer. ?Cervical cancer. ?Colorectal cancer. ?Skin cancer. ?Lung cancer. ?What should I know about heart disease, diabetes, and high blood pressure? ?Blood pressure and heart disease ?High blood pressure causes heart disease and increases the risk of stroke. This is more likely to develop in people who have high blood pressure readings or are overweight. ?Have your blood pressure checked: ?Every 3-5 years if you are 73-39 years of age. ?Every year if you are 70 years old or older. ?Diabetes ?Have regular diabetes screenings. This checks your fasting blood sugar level. Have the screening done: ?Once every three years after age 71 if you are at a normal weight and have a low risk for diabetes. ?More often and at a younger age if you are overweight or have a high risk for diabetes. ?What should I know about preventing infection? ?Hepatitis B ?If you have a higher risk for hepatitis B, you should be screened for this virus. Talk with your health care provider to find out if you are at risk for hepatitis B infection. ?Hepatitis C ?Testing is recommended for: ?Everyone born from [birth year] through 1966. ?Anyone with known risk factors for hepatitis C. ?Sexually transmitted infections (STIs) ?Get screened for STIs, including gonorrhea and chlamydia, if: ?You are sexually active and are younger than 72 years of age. ?You are older than 72 years of age and your health care provider tells you that you are at risk for this type of infection. ?Your sexual activity has changed since you were last screened, and you are  at increased risk for chlamydia or gonorrhea. Ask your health care provider if you are at risk. ?Ask your health care provider about whether you are at high risk for HIV. Your health care provider may recommend a prescription medicine to help prevent HIV infection. If you choose to take medicine to prevent HIV, you should first get tested for HIV. You should then be tested every 3  months for as long as you are taking the medicine. ?Pregnancy ?If you are about to stop having your period (premenopausal) and you may become pregnant, seek counseling before you get pregnant. ?Take 400 to 800 micrograms (mcg) of folic acid every day if you become pregnant. ?Ask for birth control (contraception) if you want to prevent pregnancy. ?Osteoporosis and menopause ?Osteoporosis is a disease in which the bones lose minerals and strength with aging. This can result in bone fractures. If you are 67 years old or older, or if you are at risk for osteoporosis and fractures, ask your health care provider if you should: ?Be screened for bone loss. ?Take a calcium or vitamin D supplement to lower your risk of fractures. ?Be given hormone replacement therapy (HRT) to treat symptoms of menopause. ?Follow these instructions at home: ?Alcohol use ?Do not drink alcohol if: ?Your health care provider tells you not to drink. ?You are pregnant, may be pregnant, or are planning to become pregnant. ?If you drink alcohol: ?Limit how much you have to: ?0-1 drink a day. ?Know how much alcohol is in your drink. In the [place] one drink equals one 12 oz bottle of beer (355 mL), one 5 oz glass of wine (148 mL), or one 1? oz glass of hard liquor (44 mL). ?Lifestyle ?Do not use any products that contain nicotine or tobacco. These products include cigarettes, chewing tobacco, and vaping devices, such as e-cigarettes. If you need help quitting, ask your health care provider. ?Do not use street drugs. ?Do not share needles. ?Ask your health care provider for help if you need support or information about quitting drugs. ?General instructions ?Schedule regular health, dental, and eye exams. ?Stay current with your vaccines. ?Tell your health care provider if: ?You often feel depressed. ?You have ever been abused or do not feel safe at home. ?Summary ?Adopting a healthy lifestyle and getting preventive care are important in promoting health  and wellness. ?Follow your health care provider's instructions about healthy diet, exercising, and getting tested or screened for diseases. ?Follow your health care provider's instructions on monitoring your cholesterol and blood pressure. ?This information is not intended to replace advice given to you by your health care provider. Make sure you discuss any questions you have with your health care provider. ?Document Revised: 05/31/2020 Document Reviewed: 05/31/2020 ?Elsevier Patient Education ? [place]. ? ?

## 2021-04-06 NOTE — Assessment & Plan Note (Signed)
Chronic  Clinically euthyroid Currently taking levothyroxine 50 mcg daily Check tsh  Titrate med dose if needed  

## 2021-04-06 NOTE — Assessment & Plan Note (Signed)
Chronic ?mild ?

## 2021-04-06 NOTE — Assessment & Plan Note (Addendum)
Chronic ?dexa due - ordered ?exercises regularly ?Taking calcium and vitamin d ?Ck vitamin d level ?

## 2021-04-06 NOTE — Assessment & Plan Note (Addendum)
Chronic ?Controlled, stable ?Continue sertraline 25 mg daily-she will try to decrease the dose at some point to half a pill and see how she does with that-most likely will be able to get off of medication ? ?

## 2021-04-13 ENCOUNTER — Inpatient Hospital Stay (HOSPITAL_BASED_OUTPATIENT_CLINIC_OR_DEPARTMENT_OTHER): Admission: RE | Admit: 2021-04-13 | Payer: Medicare PPO | Source: Ambulatory Visit

## 2021-04-13 ENCOUNTER — Other Ambulatory Visit: Payer: Self-pay

## 2021-04-13 ENCOUNTER — Ambulatory Visit (HOSPITAL_BASED_OUTPATIENT_CLINIC_OR_DEPARTMENT_OTHER)
Admission: RE | Admit: 2021-04-13 | Discharge: 2021-04-13 | Disposition: A | Discharge status: Home / Self Care | Payer: Medicare PPO | Source: Ambulatory Visit | Attending: Internal Medicine | Admitting: Internal Medicine

## 2021-04-13 DIAGNOSIS — Z13820 Encounter for screening for osteoporosis: Secondary | ICD-10-CM | POA: Diagnosis not present

## 2021-04-13 DIAGNOSIS — M810 Age-related osteoporosis without current pathological fracture: Secondary | ICD-10-CM | POA: Diagnosis not present

## 2021-04-13 DIAGNOSIS — Z780 Asymptomatic menopausal state: Secondary | ICD-10-CM | POA: Insufficient documentation

## 2021-04-13 DIAGNOSIS — M85859 Other specified disorders of bone density and structure, unspecified thigh: Secondary | ICD-10-CM

## 2021-04-13 DIAGNOSIS — M85832 Other specified disorders of bone density and structure, left forearm: Secondary | ICD-10-CM | POA: Diagnosis not present

## 2021-04-22 ENCOUNTER — Other Ambulatory Visit: Payer: Self-pay | Admitting: Internal Medicine

## 2021-04-25 ENCOUNTER — Ambulatory Visit: Payer: Medicare PPO | Admitting: Pulmonary Disease

## 2021-04-25 ENCOUNTER — Encounter: Payer: Self-pay | Admitting: Pulmonary Disease

## 2021-04-25 VITALS — BP 150/70 | HR 71 | Temp 97.7°F | Ht 67.0 in | Wt 119.6 lb

## 2021-04-25 DIAGNOSIS — J449 Chronic obstructive pulmonary disease, unspecified: Secondary | ICD-10-CM

## 2021-04-25 DIAGNOSIS — R918 Other nonspecific abnormal finding of lung field: Secondary | ICD-10-CM | POA: Diagnosis not present

## 2021-04-25 MED ORDER — DULERA 200-5 MCG/ACT IN AERO
2.0000 | INHALATION_SPRAY | Freq: Two times a day (BID) | RESPIRATORY_TRACT | 11 refills | Status: DC
Start: 2021-04-25 — End: 2022-04-13

## 2021-04-25 NOTE — Progress Notes (Signed)
? ? ?Subjective:  ? ?PATIENT ID: Tina Lucas GENDER: female DOB: April 05, 1950, MRN: [id number] ? ? ?HPI ? ?Chief Complaint  ?Patient presents with  ? Follow-up  ?  No concerns.  Using Xopenex 3 x per week.  Staying active.  ? ? ?Reason for Visit: Follow-up ? ?Tina Lucas is a 71 year old female never smoker with childhood asthma (shots, nebulizers and inhalers) who presents for follow-up. ? ?02/16/20 ?Since our last visit, she reports shortness of breath that restarted when she cut back on prilosec 40 mg BID. She had decreased her PPI after her GI work-up but noticed gradually worsening symptoms like belching, retrosternal burning. This is associated with shortness of breath and needing her Xopenex once a day. Denies cough or wheezing. She discussed with her PCP last week and is restarting her PPI. Otherwise she has been compliant with her Dulera. Still plays pickleball with her grandchildren. Symptoms are triggered by activity and heat. No cough or wheezing. ? ?11/18/20 ?Since our last visit, she had COVID-19 in 07/2021 and has recovered fully. She has been compliant with her Dulera. She is playing pickleball 3-4 times a week and only needing xopenex once a day for shortness of breath. Denies wheezing or coughing She is pleased with her current quality of life. She is anxious regarding her CT scan. ? ?04/25/21 ?She continues to be active active as baseline. Compliant with her Dulera. Only uses her xopenex daily and as needed when playing pickleball for shortness of breath. Denies coughing or wheezing.  ? ?Asthma Control Test ACT Total Score  ?02/16/2020 ? 9:29 AM 12  ?12/15/2019 ? 9:47 AM 20  ?05/19/2019 ? 3:39 PM 12  ? ?Social History: ?Never smoker ?Retired Pharmacist, hospital ?MS degree in exercise physiology ?No exposures to COVID-19.  ?She lives in [place] near her grandchildren ? ?Environmental exposures:  ?Significant second-hand (father) smoke exposure and noticeably improved after moving for college.   ?No known  or significant exposures to chemicals, raw materials or metals ? ?Past Medical History:  ?Diagnosis Date  ? Allergic rhinitis   ? Allergy   ? Asthma   ? as a child  ? Cataract   ? 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 COPD (chronic obstructive pulmonary disease) ([place])   ? History of asthma 1956-2000  ? Hypothyroid 2013  ? Osteopenia   ? Primary HSV infection of mouth 2008  ? Pulmonary nodule   ? Sunlight-induced angio-edema-urticaria 07/11/2011  ? unknown trigger, neg autoimmune workup  ? ? ?Outpatient Medications Prior to Visit  ?Medication Sig Dispense Refill  ? Calcium Carbonate-Vitamin D 600-400 MG-UNIT tablet Take 1 tablet by mouth daily.    ? celecoxib (CELEBREX) 100 MG capsule Take 100 mg by mouth daily.    ? cholecalciferol (VITAMIN D) 400 UNITS TABS tablet Take 400 Units by mouth.    ? diclofenac Sodium (VOLTAREN) 1 % GEL     ? Digestive Enzymes (MULTI-ENZYME PO) Take by mouth daily before breakfast. Patient takes [employer]    ? fexofenadine (ALLEGRA) 180 MG tablet Take 180 mg by mouth daily.    ? fluocinonide cream (LIDEX) 9.50 % Apply 1 application topically 2 (two) times daily as needed (FOR PSORIASIS).     ? hydrocortisone 2.5 % cream     ? levalbuterol (XOPENEX HFA) 45 MCG/ACT inhaler INHALE ONE TO TWO PUFFS BY MOUTH EVERY 4 HOURS AS NEEDED FOR WHEEZING 15 g 2  ? levothyroxine (SYNTHROID) 50 MCG tablet TAKE ONE  TABLET BY MOUTH DAILY WITH BREAKFAST 90 tablet 3  ? Magnesium 250 MG TABS Take 125 mg by mouth daily.    ? Multiple Vitamins-Minerals (ONE-A-DAY WOMENS 50+ ADVANTAGE PO) Take by mouth daily.    ? omeprazole (PRILOSEC) 40 MG capsule Take 40 mg by mouth at bedtime.    ? Probiotic Product (PROBIOTIC PO) Take by mouth daily before breakfast. Patient takes Emiliano Dyer Probiotic Eleven (one at breakfast)    ? sertraline (ZOLOFT) 25 MG tablet TAKE ONE TABLET BY MOUTH AT BEDTIME 30 tablet 5  ? triamcinolone (KENALOG) 0.1 % paste     ? mometasone-formoterol (DULERA)  200-5 MCG/ACT AERO Inhale 2 puffs into the lungs in the morning and at bedtime. 13 g 11  ? ?No facility-administered medications prior to visit.  ? ? ?Review of Systems  ?Constitutional:  Negative for chills, diaphoresis, fever, malaise/fatigue and weight loss.  ?HENT:  Negative for congestion.   ?Respiratory:  Negative for cough, hemoptysis, sputum production, shortness of breath and wheezing.   ?Cardiovascular:  Negative for chest pain, palpitations and leg swelling.  ? ?Objective:  ? ?Vitals:  ? 04/25/21 1006 04/25/21 1028  ?BP: (!) 150/70 (!) 150/70  ?Pulse: 71   ?Temp: 97.7 ?F (36.5 ?C)   ?TempSrc: Oral   ?SpO2: 98%   ?Weight: 119 lb 9.6 oz (54.3 kg)   ?Height: '5\' 7"'$  (1.702 m)   ? ?SpO2: 98 % ?O2 Device: None (Room air) ? ?Physical Exam: ?General: Well-appearing, no acute distress ?HENT: [place], AT ?Eyes: EOMI, no scleral icterus ?Respiratory: Clear to auscultation bilaterally.  No crackles, wheezing or rales ?Cardiovascular: RRR, -M/R/G, no JVD ?Extremities:-Edema,-tenderness ?Neuro: AAO x4, CNII-XII grossly intact ?Psych: Normal mood, normal affect ? ?Data Reviewed: ? ?Imaging: ?CT Chest 08/01/18- RUL GGO nodule measured 66m and solid nodule measured 535m?CT Chest 01/30/19 - RUL nodule increased to 87m54mScattered lung nodules <4mm28m left and right upper lobes. ?CT Chest 02/16/19 - Unchanged prior lung nodules. Interval solid 5mm 12m and multifocal ground glass nodular opacities. ?CT Chest 01/30/20 - Interval development of solid 5mm n85mle in RUL with adjacent smaller nodes and ground glass nodule in RUL 11x11mm. 77mininig nodules unchanged. ?CT Chest 08/04/20 - New 7 mm nodule in the anterior right lung apex with mild subpleural retraction. Previously seen 5 and 11 mm nodules in the right upper lobe have resolved. New tiny clustered centrilobular nodules in the central right lower lobe, nonspecific resolved ?CT Chest 11/15/20 - Interval RLL subsolid nodule ~87mm. Si72mar/slightly decreased lesions in RUL and LUL ?CT  chest 02/15/2021- Resolved right lower lobe nodule, no longer present.  Stable right upper lobe nodules ~4mm ? ?P50m ?08/27/2018 ?FVC 2.78 (78 %) FEV1 1.76 (65 %) Ratio 63 TLC 117 % RV 175% RV/TLC 144% DLCO 107 % ?Interpretation: Moderate obstructive defect with air trapping.  No significant bronchodilator response.  Normal TLC.  Normal DLCO ? ?Labs: ?CBC 08/06/18 9.5% Eos with 500 eos. Since 2015 absolute eos range from 400-600. ?CBC 03/31/2019 3.4% Eos with 200 absolute eos ?Absolute eos 04/06/21 - 400 ?  ?Assessment & Plan:  ? ?Discussion: ?70 year o73 female never smoker with history of childhood asthma with moderate asthma and history of pulmonary nodules who presents for follow-up.  History of eosinophilia reviewed from 400-600 since 2015 likely associated with her asthma. We reviewed prior CT scans since 2021. We discussed risks and benefits of surveillance imaging and after discussion patient would prefer continued surveillance. ? ?Moderate persistent COPD asthma overlap- well-controlled ?--CONTINUE Dulera 200-5  mcg TWO puffs TWICE a day. REFILLED ? Discussed de-escalating ONE puff TWICE a day during the summer ?--CONTINUE Xopenex inhaler as needed for chest tightness. OK to take before activity. ?--CONTINUE Protonix ?  ?Multiple subsolid lung nodules ?Waxing and waning pulmonary nodules.  Last CT on 02/15/2021 reviewed.  Resolved right lower lobe nodules and previously seen nodules stable with no concern for malignancy or infection. ?--ORDER CT Chest with contrast in April 2024 next year ? ?Hypertension ?--Vitals in-clinic significant for BP 150/70. Advised patient to discuss with PCP. She states this is common and has white coat hypertension and has logs of normal BP at home. ? ?Health Maintenance ?Immunization History  ?Administered Date(s) Administered  ? Fluad Quad(high Dose 65+) 10/09/2018, 10/28/2020  ? Influenza Split 10/30/2013  ? Influenza, High Dose Seasonal PF 10/25/2017, 10/17/2019  ?  Influenza-Unspecified 10/19/2014, 10/19/2015, 10/27/2016  ? PFIZER(Purple Top)SARS-COV-2 Vaccination 02/12/2019, 03/05/2019, 10/30/2019  ? Pension scheme manager 18yr & up 11/23/2020  ? Pneumococcal Conjug

## 2021-04-25 NOTE — Patient Instructions (Signed)
Moderate persistent COPD asthma overlap- well-controlled ?CONTINUE Dulera 200-5 mcg TWO puffs TWICE a day. REFILLED ?CONTINUE Xopenex inhaler as needed for chest tightness. OK to take before activity. ?CONTINUE Protonix ?  ?Multiple subsolid lung nodules ?Waxing and waning pulmonary nodules.  Last CT on 02/15/2021 reviewed.  Resolved right lower lobe nodules and previously seen nodules stable with no concern for malignancy or infection. ?--ORDER CT Chest with contrast in April 2024 next year ? ? ?Follow-up with me in 1 year ?

## 2021-05-04 ENCOUNTER — Ambulatory Visit: Payer: Medicare PPO | Admitting: Podiatry

## 2021-05-04 ENCOUNTER — Encounter: Payer: Self-pay | Admitting: Podiatry

## 2021-05-04 DIAGNOSIS — M79674 Pain in right toe(s): Secondary | ICD-10-CM | POA: Diagnosis not present

## 2021-05-04 DIAGNOSIS — M79675 Pain in left toe(s): Secondary | ICD-10-CM

## 2021-05-04 DIAGNOSIS — B351 Tinea unguium: Secondary | ICD-10-CM

## 2021-05-05 ENCOUNTER — Encounter: Payer: Self-pay | Admitting: Internal Medicine

## 2021-05-05 NOTE — Progress Notes (Signed)
? ? ?Subjective:  ? ? Patient ID: Tina Lucas, female    DOB: 08/09/1950, 71 y.o.   MRN: [id number] ? ?This visit occurred during the SARS-CoV-2 public health emergency.  Safety protocols were in place, including screening questions prior to the visit, additional usage of staff PPE, and extensive cleaning of exam room while observing appropriate contact time as indicated for disinfecting solutions. ? ? ? ?HPI ?Tina Lucas is here for  ?Chief Complaint  ?Patient presents with  ? Shoulder Pain  ?  Right shoulder blade pain   ? ? ? ?Shoulder blade, arm and muscle pain - right arm -she denies any major injury but was wondering if it was related to doing a new chest preps machine.  She feels some pain in a couple different areas of the right shoulder.  She is very active with pickleball, doing weights, walking and does not want this to interfere with her activities. ? ?She has been applying Aspercreme, Voltaren gel and icing the shoulder.  He has not seen much improvement.  He has not noticed any decreased range of motion..  ? ? ?Played pickle ball Wednesday - pain in deltoid and scapular region ? ? ? ?Medications and allergies reviewed with patient and updated if appropriate. ? ?Current Outpatient Medications on File Prior to Visit  ?Medication Sig Dispense Refill  ? Calcium Carbonate-Vitamin D 600-400 MG-UNIT tablet Take 1 tablet by mouth daily.    ? CALTRATE 600+D3 SOFT 600-20 MG-MCG CHEW SMARTSIG:1 By Mouth    ? celecoxib (CELEBREX) 100 MG capsule Take 100 mg by mouth daily.    ? cholecalciferol (VITAMIN D) 400 UNITS TABS tablet Take 400 Units by mouth.    ? D 1000 25 MCG (1000 UT) capsule SMARTSIG:1 By Mouth    ? diclofenac Sodium (VOLTAREN) 1 % GEL     ? Digestive Enzymes (MULTI-ENZYME PO) Take by mouth daily before breakfast. Patient takes [employer]    ? famciclovir (FAMVIR) 500 MG tablet     ? famotidine (PEPCID) 20 MG tablet Take 20 mg by mouth 2 (two) times daily.    ? fexofenadine (ALLEGRA) 180 MG tablet Take  180 mg by mouth daily.    ? fluocinonide cream (LIDEX) 7.32 % Apply 1 application topically 2 (two) times daily as needed (FOR PSORIASIS).     ? hydrocortisone 2.5 % cream     ? Hydrocortisone Butyr Lipo Base 0.1 % CREA     ? hydrOXYzine (ATARAX) 10 MG tablet     ? levalbuterol (XOPENEX HFA) 45 MCG/ACT inhaler INHALE ONE TO TWO PUFFS BY MOUTH EVERY 4 HOURS AS NEEDED FOR WHEEZING 15 g 2  ? levothyroxine (SYNTHROID) 50 MCG tablet TAKE ONE TABLET BY MOUTH DAILY WITH BREAKFAST 90 tablet 3  ? Magnesium 250 MG TABS Take 125 mg by mouth daily.    ? Magnesium Citrate 100 MG TABS SMARTSIG:1 By Mouth    ? mometasone-formoterol (DULERA) 200-5 MCG/ACT AERO Inhale 2 puffs into the lungs in the morning and at bedtime. 13 g 11  ? Multiple Vitamins-Minerals (ONE-A-DAY WOMENS 50+ ADVANTAGE PO) Take by mouth daily.    ? NEXIUM 40 MG capsule SMARTSIG:1 By Mouth    ? omeprazole (PRILOSEC) 40 MG capsule Take 40 mg by mouth at bedtime.    ? predniSONE (DELTASONE) 10 MG tablet Take 40 mg by mouth every morning.    ? Probiotic Product (PROBIOTIC PO) Take by mouth daily before breakfast. Patient takes Emiliano Dyer Probiotic Eleven (one at breakfast)    ?  sertraline (ZOLOFT) 25 MG tablet TAKE ONE TABLET BY MOUTH AT BEDTIME 30 tablet 5  ? triamcinolone (KENALOG) 0.1 % paste     ? ?No current facility-administered medications on file prior to visit.  ? ? ?Review of Systems ? ?   ?Objective:  ? ?Vitals:  ? 05/06/21 1104  ?BP: 120/74  ?Pulse: 65  ?Temp: 98.4 ?F (36.9 ?C)  ?SpO2: 99%  ? ?BP Readings from Last 3 Encounters:  ?05/06/21 120/74  ?04/25/21 (!) 150/70  ?04/06/21 140/80  ? ?Wt Readings from Last 3 Encounters:  ?05/06/21 121 lb (54.9 kg)  ?04/25/21 119 lb 9.6 oz (54.3 kg)  ?04/06/21 116 lb (52.6 kg)  ? ?Body mass index is 18.95 kg/m?. ? ?  ?Physical Exam ?   ?A Right Shoulder exam was performed.  ? ?SWELLING: none  ?EFFUSION: no  ?WARMTH: no warmth  ?TENDERNESS: Minimal tenderness anterior and upper shoulder ?ROM: full ROM without  pain ?NEUROLOGICAL EXAM: normal sensation and strength  ?PULSES: normal  ? ? ? ? ? ?Assessment & Plan:  ? ? ?See Problem List for Assessment and Plan of chronic medical problems.  ? ? ? ? ?

## 2021-05-06 ENCOUNTER — Ambulatory Visit: Payer: Medicare PPO | Admitting: Internal Medicine

## 2021-05-06 VITALS — BP 120/74 | HR 65 | Temp 98.4°F | Ht 67.0 in | Wt 121.0 lb

## 2021-05-06 DIAGNOSIS — M25511 Pain in right shoulder: Secondary | ICD-10-CM | POA: Insufficient documentation

## 2021-05-06 NOTE — Assessment & Plan Note (Signed)
Acute ?No obvious injury ??  Related to using a new machine at the gym-chest press ?She will stop doing that machine ?Conservative measures at home including Voltaren gel, Aspercreme, Celebrex and icing have not helped ?Will refer to orthopedics for further evaluation and treatment ?

## 2021-05-06 NOTE — Patient Instructions (Signed)
? ? ? ? ?  Medications changes include :   none ? ? ? ?A referral was ordered for [facility].     Someone from that office will call you to schedule an appointment.  ? ? ?

## 2021-05-10 ENCOUNTER — Other Ambulatory Visit (HOSPITAL_BASED_OUTPATIENT_CLINIC_OR_DEPARTMENT_OTHER): Payer: Self-pay | Admitting: Orthopaedic Surgery

## 2021-05-10 DIAGNOSIS — M25511 Pain in right shoulder: Secondary | ICD-10-CM

## 2021-05-11 ENCOUNTER — Ambulatory Visit (HOSPITAL_BASED_OUTPATIENT_CLINIC_OR_DEPARTMENT_OTHER): Payer: Medicare PPO | Admitting: Orthopaedic Surgery

## 2021-05-11 ENCOUNTER — Ambulatory Visit (HOSPITAL_BASED_OUTPATIENT_CLINIC_OR_DEPARTMENT_OTHER)
Admission: RE | Admit: 2021-05-11 | Discharge: 2021-05-11 | Disposition: A | Discharge status: Home / Self Care | Payer: Medicare PPO | Source: Ambulatory Visit | Attending: Orthopaedic Surgery | Admitting: Orthopaedic Surgery

## 2021-05-11 DIAGNOSIS — M25511 Pain in right shoulder: Secondary | ICD-10-CM | POA: Insufficient documentation

## 2021-05-11 DIAGNOSIS — M19011 Primary osteoarthritis, right shoulder: Secondary | ICD-10-CM | POA: Diagnosis not present

## 2021-05-11 NOTE — Progress Notes (Signed)
? ?                            ? ? ?Chief Complaint: Right shoulder pain ?  ? ? ?History of Present Illness:  ? ? ?Tina Lucas is a 71 y.o. female right-hand-dominant female presents with right shoulder pain which has been significant over the course of the last several years although worse for the last couple of weeks.  She is a very avid [employer].  She did recently play in a tournament which aggravated the shoulder significantly.  She is very active and likes to go to the gym multiple times weekly.  She continues to work on her strength.  She is having deep pain in the shoulder particular with laying on the side.  She is here today for further assessment and advice.  She is hoping to avoid any type of surgery in the future.  She continues to remain very avid with pickleball this time. ? ? ? ?Surgical History:   ?None ? ?PMH/PSH/Family History/Social History/Meds/Allergies:   ? ?Past Medical History:  ?Diagnosis Date  ? Allergic rhinitis   ? Allergy   ? Asthma   ? as a child  ? Cataract   ? 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 COPD (chronic obstructive pulmonary disease) ([place])   ? History of asthma 1956-2000  ? Hypothyroid 2013  ? Osteopenia   ? Primary HSV infection of mouth 2008  ? Pulmonary nodule   ? Sunlight-induced angio-edema-urticaria 07/11/2011  ? unknown trigger, neg autoimmune workup  ? ?Past Surgical History:  ?Procedure Laterality Date  ? 63 HOUR [place] STUDY N/A 08/13/2019  ? Procedure: 24 HOUR PH STUDY- impedence;  Surgeon: Mansouraty, Telford Nab., MD;  Location: [facility];  Service: Gastroenterology;  Laterality: N/A;  ? COLONOSCOPY    ? ESOPHAGEAL MANOMETRY N/A 08/13/2019  ? Procedure: ESOPHAGEAL MANOMETRY (EM);  Surgeon: Mansouraty, Telford Nab., MD;  Location: [facility];  Service: Gastroenterology;  Laterality: N/A;  ? [place] IMPEDANCE STUDY  08/13/2019  ? Procedure: [place] IMPEDANCE STUDY;  Surgeon: Rush Landmark Telford Nab., MD;  Location: [facility];   Service: Gastroenterology;;  ? polyp on vocal chord     ? TONSILLECTOMY  1958  ? ?Social History  ? ?Socioeconomic History  ? Marital status: Married  ?  Spouse name: Not on file  ? Number of children: Not on file  ? Years of education: Not on file  ? Highest education level: Not on file  ?Occupational History  ? Not on file  ?Tobacco Use  ? Smoking status: Never  ? Smokeless tobacco: Never  ?Vaping Use  ? Vaping Use: Never used  ?Substance and Sexual Activity  ? Alcohol use: Not Currently  ?  Alcohol/week: 0.0 standard drinks  ? Drug use: No  ? Sexual activity: Not on file  ?Other Topics Concern  ? Not on file  ?Social History Narrative  ? Lives with husband in a one story home.  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  ? ?Social Determinants of Health  ? ?Financial Resource Strain: Low Risk   ? Difficulty of Paying Living Expenses: Not hard at all  ?Food Insecurity: No Food Insecurity  ? Worried About Charity fundraiser in the Last Year: Never true  ? Ran Out of Food in the Last Year: Never true  ?Transportation Needs: No Transportation Needs  ? Lack of Transportation (Medical):  No  ? Lack of Transportation (Non-Medical): No  ?Physical Activity: Sufficiently Active  ? Days of Exercise per Week: 7 days  ? Minutes of Exercise per Session: 60 min  ?Stress: No Stress Concern Present  ? Feeling of Stress : Not at all  ?Social Connections: Socially Integrated  ? Frequency of Communication with Friends and Family: More than three times a week  ? Frequency of Social Gatherings with Friends and Family: More than three times a week  ? Attends Religious Services: More than 4 times per year  ? Active Member of Clubs or Organizations: Yes  ? Attends Archivist Meetings: More than 4 times per year  ? Marital Status: Married  ? ?Family History  ?Problem Relation Age of Onset  ? Breast cancer Mother 79  ? Osteoarthritis Father   ? Hypertension Father   ? Leukemia Maternal Aunt 8  ? Atrial fibrillation  Brother   ? Colon cancer Neg Hx   ? Esophageal cancer Neg Hx   ? Inflammatory bowel disease Neg Hx   ? Liver disease Neg Hx   ? Pancreatic cancer Neg Hx   ? Rectal cancer Neg Hx   ? Stomach cancer Neg Hx   ? ?Allergies  ?Allergen Reactions  ? Singulair [Montelukast Sodium] Other (See Comments)  ?  headaches  ? ?Current Outpatient Medications  ?Medication Sig Dispense Refill  ? Calcium Carbonate-Vitamin D 600-400 MG-UNIT tablet Take 1 tablet by mouth daily.    ? CALTRATE 600+D3 SOFT 600-20 MG-MCG CHEW SMARTSIG:1 By Mouth    ? celecoxib (CELEBREX) 100 MG capsule Take 100 mg by mouth daily.    ? cholecalciferol (VITAMIN D) 400 UNITS TABS tablet Take 400 Units by mouth.    ? D 1000 25 MCG (1000 UT) capsule SMARTSIG:1 By Mouth    ? diclofenac Sodium (VOLTAREN) 1 % GEL     ? Digestive Enzymes (MULTI-ENZYME PO) Take by mouth daily before breakfast. Patient takes [employer]    ? famciclovir (FAMVIR) 500 MG tablet     ? famotidine (PEPCID) 20 MG tablet Take 20 mg by mouth 2 (two) times daily.    ? fexofenadine (ALLEGRA) 180 MG tablet Take 180 mg by mouth daily.    ? fluocinonide cream (LIDEX) 2.42 % Apply 1 application topically 2 (two) times daily as needed (FOR PSORIASIS).     ? hydrocortisone 2.5 % cream     ? Hydrocortisone Butyr Lipo Base 0.1 % CREA     ? hydrOXYzine (ATARAX) 10 MG tablet     ? levalbuterol (XOPENEX HFA) 45 MCG/ACT inhaler INHALE ONE TO TWO PUFFS BY MOUTH EVERY 4 HOURS AS NEEDED FOR WHEEZING 15 g 2  ? levothyroxine (SYNTHROID) 50 MCG tablet TAKE ONE TABLET BY MOUTH DAILY WITH BREAKFAST 90 tablet 3  ? Magnesium 250 MG TABS Take 125 mg by mouth daily.    ? Magnesium Citrate 100 MG TABS SMARTSIG:1 By Mouth    ? mometasone-formoterol (DULERA) 200-5 MCG/ACT AERO Inhale 2 puffs into the lungs in the morning and at bedtime. 13 g 11  ? Multiple Vitamins-Minerals (ONE-A-DAY WOMENS 50+ ADVANTAGE PO) Take by mouth daily.    ? NEXIUM 40 MG capsule SMARTSIG:1 By Mouth    ? omeprazole (PRILOSEC) 40 MG capsule  Take 40 mg by mouth at bedtime.    ? predniSONE (DELTASONE) 10 MG tablet Take 40 mg by mouth every morning.    ? Probiotic Product (PROBIOTIC PO) Take by mouth daily before breakfast. Patient takes  [employer] Probiotic Eleven (one at breakfast)    ? sertraline (ZOLOFT) 25 MG tablet TAKE ONE TABLET BY MOUTH AT BEDTIME 30 tablet 5  ? triamcinolone (KENALOG) 0.1 % paste     ? ?No current facility-administered medications for this visit.  ? ?No results found. ? ?Review of Systems:   ?A ROS was performed including pertinent positives and negatives as documented in the HPI. ? ?Physical Exam :   ?Constitutional: NAD and appears stated age ?Neurological: Alert and oriented ?Psych: Appropriate affect and cooperative ?There were no vitals taken for this visit.  ? ?Comprehensive Musculoskeletal Exam:   ? ?Musculoskeletal Exam    ?Inspection Right Left  ?Skin No atrophy or winging No atrophy or winging  ?Palpation    ?Tenderness Glenohumeral none  ?Range of Motion    ?Flexion (passive) 170 170  ?Flexion (active) 170 170  ?Abduction 170 170  ?ER at the side 70 70  ?Can reach behind back to T12 T12  ?Strength    ? Full full  ?    ?Pseudoparalytic No No  ?Neurologic    ?Fires PIN, radial, median, ulnar, musculocutaneous, axillary, suprascapular, long thoracic, and spinal accessory innervated muscles. No abnormal sensibility  ?Vascular/Lymphatic    ?Radial Pulse 2+ 2+  ?Cervical Exam    ?Patient has symmetric cervical range of motion with negative Spurling's test.  ?Special Test:   ? ? ? ?Imaging:   ?Xray (3 views right shoulder): ?Mild glenohumeral osteoarthritis ? ? ?I personally reviewed and interpreted the radiographs. ? ? ?Assessment:   ?71 y.o. female very active pickleball player with right shoulder pain consistent with glenohumeral osteoarthritis.  Overall her symptoms are very mild.  I did advise that it initial treatment option would be a glenohumeral injection which I believe she would do very well with.  She would  prefer to defer this until she absolutely needs it.  I do not believe that this is unreasonable as she is tolerating her symptoms quite well.  She will come back and see me on an return to clinic as needed. ?P

## 2021-05-13 NOTE — Progress Notes (Signed)
Subjective: ?Tina Lucas is a pleasant 71 y.o. female patient seen today for painful elongated mycotic toenails 1-5 bilaterally which are tender when wearing enclosed shoe gear. Pain is relieved with periodic professional debridement.  ? ? ?PCP is Binnie Rail, MD. Last visit was: 05/04/2020. ? ?She voices no new pedal problems on today's visit. ? ?Allergies  ?Allergen Reactions  ? Singulair [Montelukast Sodium] Other (See Comments)  ?  headaches  ? ? ?Objective: ?Physical Exam ? ?General: Tina Lucas is a pleasant 71 y.o. Caucasian female, WD, WN in NAD. AAO x 3.  ? ?Vascular:  ?CFT immediate b/l LE. Palpable DP/PT pulses b/l LE. Digital hair present b/l. Skin temperature gradient WNL b/l. No pain with calf compression b/l. No edema noted b/l. No cyanosis or clubbing noted b/l LE.  ? ?Dermatological:  ?Pedal integument with normal turgor, texture and tone b/l LE. No open wounds b/l. No interdigital macerations b/l. Toenails 1-5 b/l elongated, thickened, discolored with subungual debris. +Tenderness with dorsal palpation of nailplates. No hyperkeratotic or porokeratotic lesions present.  ? ?Musculoskeletal:  ?Normal muscle strength 5/5 to all lower extremity muscle groups bilaterally. HAV with bunion deformity noted b/l LE.Marland Kitchen No pain, crepitus or joint limitation noted with ROM b/l LE.  Patient ambulates independently without assistive aids.  ? ?Neurological:  ?Protective sensation intact 5/5 intact bilaterally with 10g monofilament b/l. Vibratory sensation intact b/l. Proprioception intact bilaterally. Deep tendon reflexes normal b/l.   ? ?Assessment and Plan:  ?1. Pain due to onychomycosis of toenails of both feet   ?  ?Patient was evaluated and treated and all questions answered. ?Consent given for treatment as described below: ?-Examined patient. ?-Patient to continue soft, supportive shoe gear daily. ?-Toenails 1-5 b/l were debrided in length and girth with sterile nail nippers and dremel without  iatrogenic bleeding.  ?-Patient/POA to call should there be question/concern in the interim. ? ?Return in about 4 months (around 09/03/2021). ? ?Marzetta Board, DPM ?

## 2021-05-24 DIAGNOSIS — L282 Other prurigo: Secondary | ICD-10-CM | POA: Diagnosis not present

## 2021-05-24 DIAGNOSIS — L309 Dermatitis, unspecified: Secondary | ICD-10-CM | POA: Diagnosis not present

## 2021-05-27 ENCOUNTER — Other Ambulatory Visit: Payer: Self-pay | Admitting: Internal Medicine

## 2021-06-08 DIAGNOSIS — Z1231 Encounter for screening mammogram for malignant neoplasm of breast: Secondary | ICD-10-CM | POA: Diagnosis not present

## 2021-06-08 LAB — HM MAMMOGRAPHY

## 2021-07-04 DIAGNOSIS — D1721 Benign lipomatous neoplasm of skin and subcutaneous tissue of right arm: Secondary | ICD-10-CM | POA: Diagnosis not present

## 2021-07-04 DIAGNOSIS — L309 Dermatitis, unspecified: Secondary | ICD-10-CM | POA: Diagnosis not present

## 2021-07-19 IMAGING — CT CT CHEST W/O CM
2 of 3 series · 15 of 36 positions shown, 18 images · non-contrast
Comparison: CT chest dated August 01, 2018.

CLINICAL DATA: Shortness of breath.

EXAM:
CT CHEST WITHOUT CONTRAST
TECHNIQUE: Multidetector CT imaging of the chest was performed following the
standard protocol without IV contrast.

[Series 2: thorax · axial · 0.63mm/px · z∈[-267,-11]mm · 12 of 152 slices shown, 15 images]
[im 12/152  mediastinal]
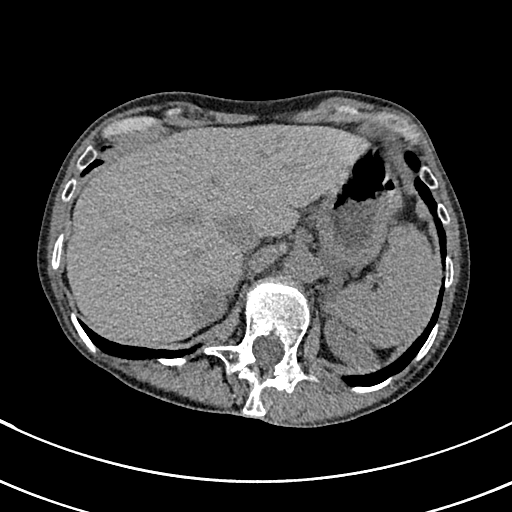
[im 12/152  lung]
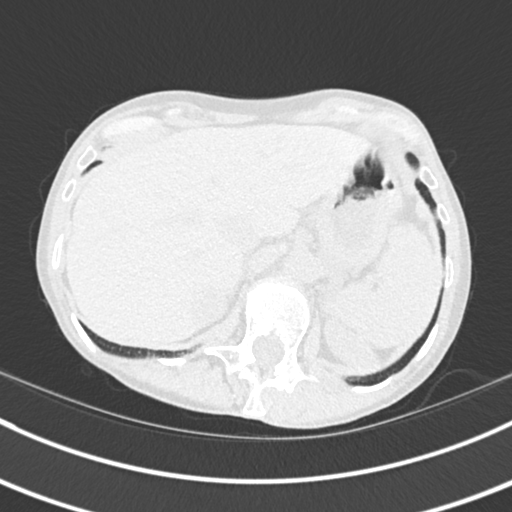
[im 23/152  lung]
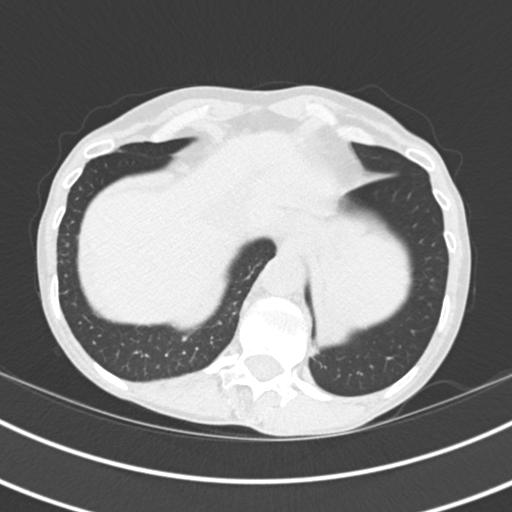
[im 34/152  lung]
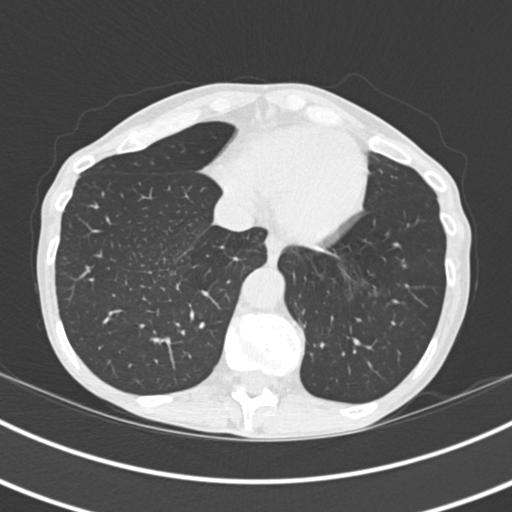
[im 45/152  lung]
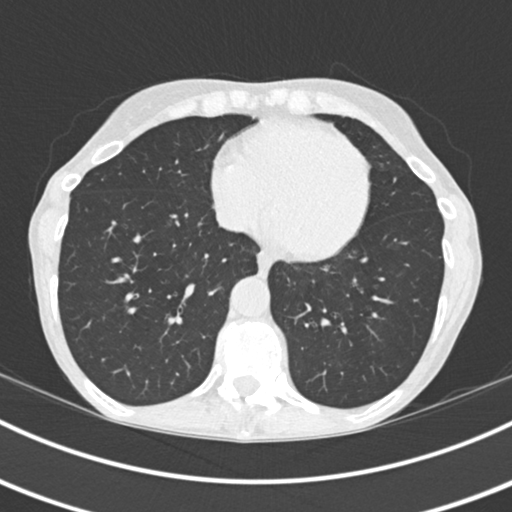
[im 56/152  mediastinal]
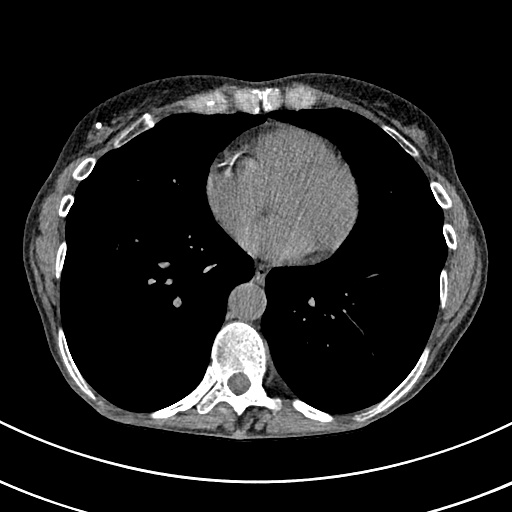
[im 56/152  lung]
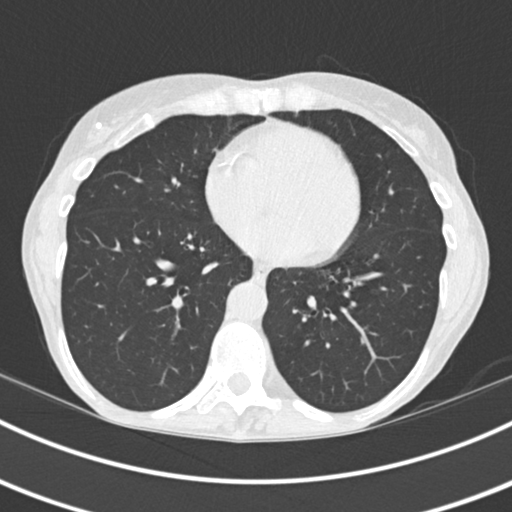
[im 68/152  lung]
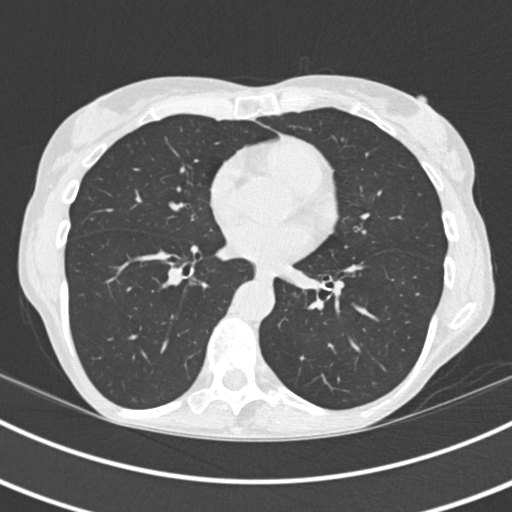
[im 84/152  lung]
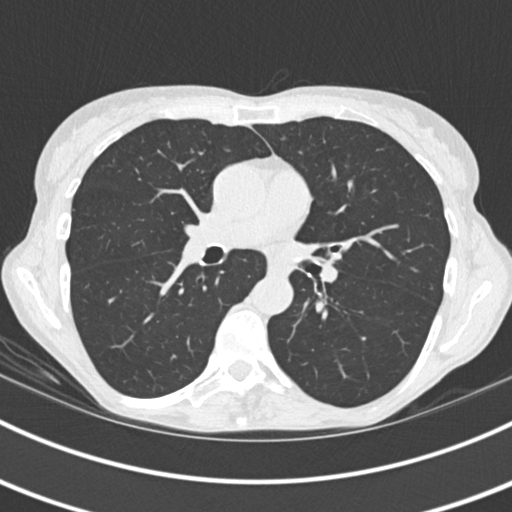
[im 96/152  lung]
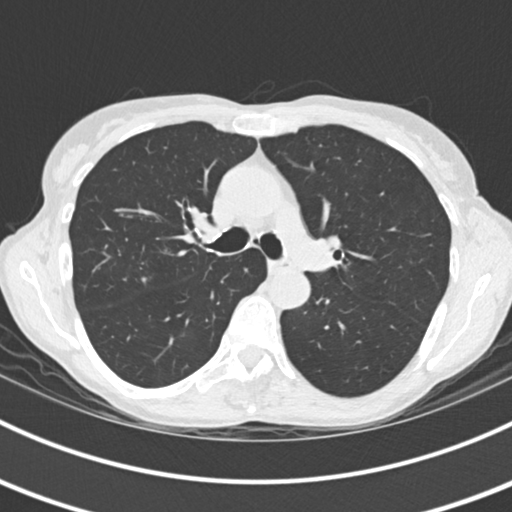
[im 107/152  mediastinal]
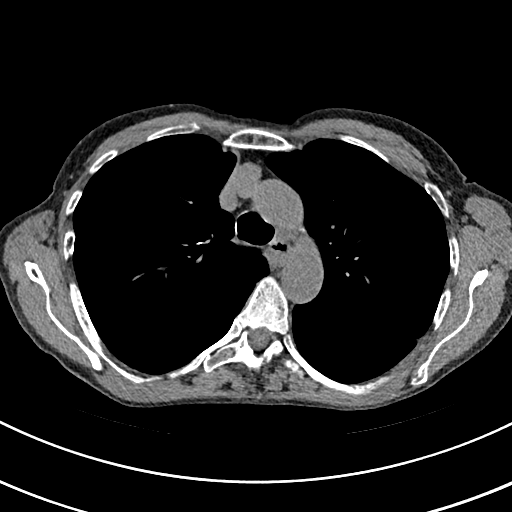
[im 107/152  lung]
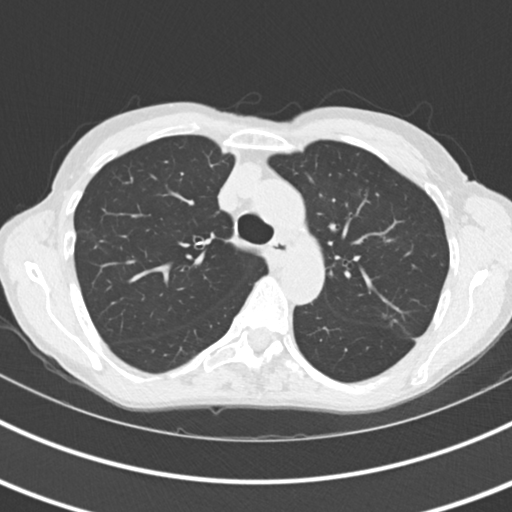
[im 118/152  lung]
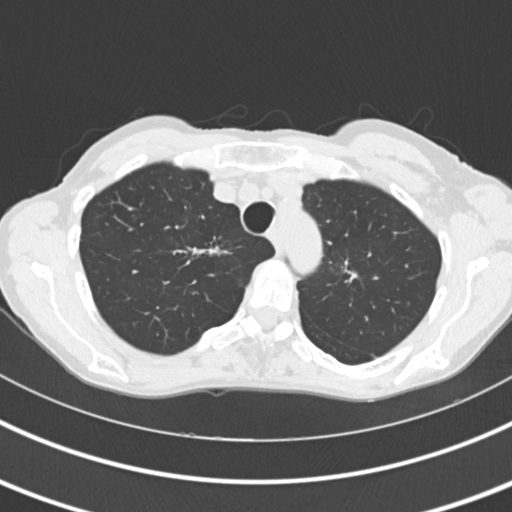
[im 129/152  lung]
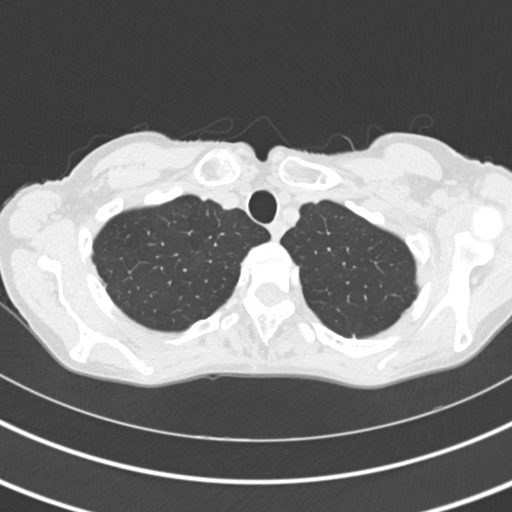
[im 140/152  lung]
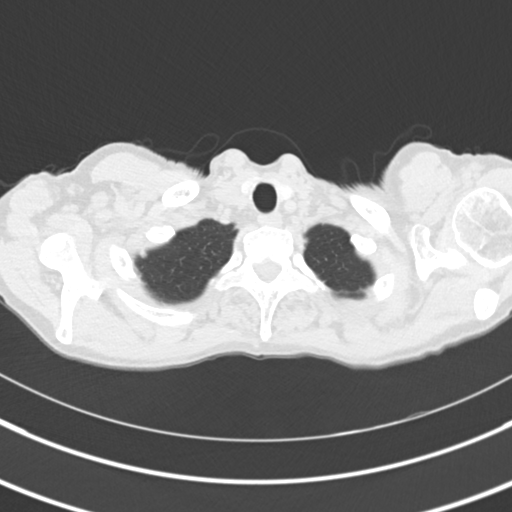

[Series 5: coronal · coronal · 0.59mm/px · 3 of 123 slices shown]
[im 25/123  lung]
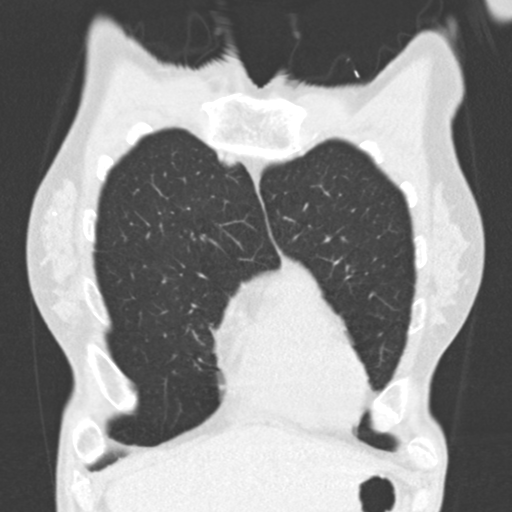
[im 49/123  lung]
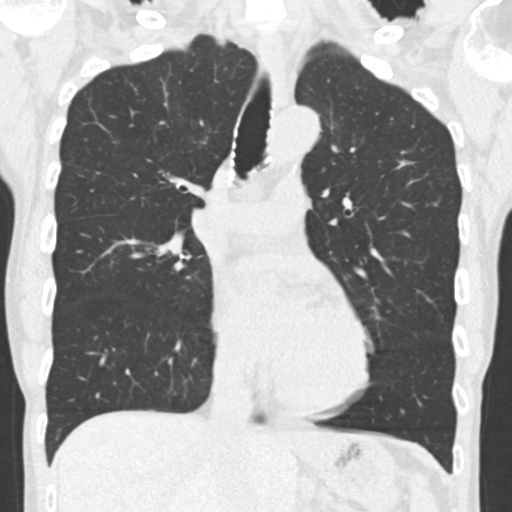
[im 74/123  lung]
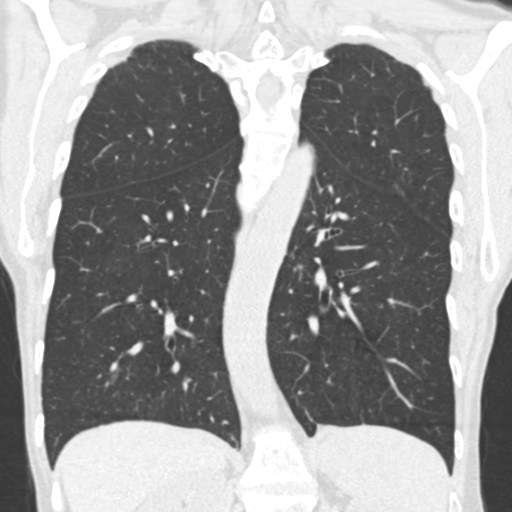

[15 of 36 positions shown; findings below may reference images not displayed]

FINDINGS: Cardiovascular: Normal heart size. No pericardial effusion. No
thoracic aortic aneurysm.

Mediastinum/Nodes: No enlarged mediastinal or axillary lymph nodes.
Thyroid gland, trachea, and esophagus demonstrate no significant
findings.

Lungs/Pleura: There are two new 3-4 mm pulmonary nodules in the
right upper lobe (series 3, images 54 and 56). Ill-defined
ground-glass nodules in the right upper lobe again identified, with
the largest mildly increased in size, now measuring 9 mm (series 3,
image 37), previously 7 mm by my measurements. Unchanged 5 mm nodule
in the medial right upper lobe (series 3, image 33). New 3 mm
ground-glass nodule in the medial left upper lobe (series 3, image
36). No focal consolidation, pleural effusion, or pneumothorax.

Upper Abdomen: No acute abnormality. Subcentimeter hypodensity in
the left hepatic lobe remains too small to characterize, but is
unchanged.

Musculoskeletal: No chest wall mass or suspicious bone lesions
identified.
IMPRESSION: 1. Ill-defined ground-glass nodule in the right upper lobe has
increased in size, now measuring 9 mm, previously 7 mm.
Follow up by CT is recommended in 12 months, with continued annual
surveillance for a minimum of 3 years.
2. Three new 3-4 mm pulmonary nodules involving the left and right
upper lobes. No follow-up needed if patient is low-risk (and has no
known or suspected primary neoplasm). Non-contrast chest CT can be
considered in 12 months if patient is high-risk. This recommendation
follows the consensus statement: Guidelines for Management of
Incidental Pulmonary Nodules Detected on CT Images: From the

## 2021-08-01 ENCOUNTER — Encounter: Payer: Self-pay | Admitting: Internal Medicine

## 2021-08-01 NOTE — Progress Notes (Signed)
Outside notes received. Information abstracted. Notes sent to scan.  

## 2021-08-24 ENCOUNTER — Encounter: Payer: Self-pay | Admitting: Podiatry

## 2021-08-24 ENCOUNTER — Ambulatory Visit: Payer: Medicare PPO | Admitting: Podiatry

## 2021-08-24 DIAGNOSIS — M79674 Pain in right toe(s): Secondary | ICD-10-CM | POA: Diagnosis not present

## 2021-08-24 DIAGNOSIS — M79675 Pain in left toe(s): Secondary | ICD-10-CM | POA: Diagnosis not present

## 2021-08-24 DIAGNOSIS — B351 Tinea unguium: Secondary | ICD-10-CM | POA: Diagnosis not present

## 2021-08-29 NOTE — Progress Notes (Signed)
  Subjective:  Patient ID: Tina Lucas, female    DOB: 05-Aug-1950,  MRN: [id number]  Tina Lucas presents to clinic today for painful thick toenails that are difficult to trim. Pain interferes with ambulation. Aggravating factors include wearing enclosed shoe gear. Pain is relieved with periodic professional debridement. New problem(s): None.   PCP is Binnie Rail, MD , and last visit was  May 06, 2021  Allergies  Allergen Reactions   Singulair [Montelukast Sodium] Other (See Comments)    headaches    Review of Systems: Negative except as noted in the HPI.  Objective: No changes noted in today's physical examination. General: Tina Lucas is a pleasant 71 y.o. Caucasian female, WD, WN in NAD. AAO x 3.   Vascular:  CFT immediate b/l LE. Palpable DP/PT pulses b/l LE. Digital hair present b/l. Skin temperature gradient WNL b/l. No pain with calf compression b/l. No edema noted b/l. No cyanosis or clubbing noted b/l LE.   Dermatological:  Pedal integument with normal turgor, texture and tone b/l LE. No open wounds b/l. No interdigital macerations b/l. Toenails 1-5 b/l elongated, thickened, discolored with subungual debris. +Tenderness with dorsal palpation of nailplates. No hyperkeratotic or porokeratotic lesions present.   Musculoskeletal:  Normal muscle strength 5/5 to all lower extremity muscle groups bilaterally. HAV with bunion deformity noted b/l LE.Marland Kitchen No pain, crepitus or joint limitation noted with ROM b/l LE.  Patient ambulates independently without assistive aids.   Neurological:  Protective sensation intact 5/5 intact bilaterally with 10g monofilament b/l. Vibratory sensation intact b/l. Proprioception intact bilaterally. Deep tendon reflexes normal b/l. Assessment/Plan: 1. Pain due to onychomycosis of toenails of both feet     -Examined patient. -Patient to continue soft, supportive shoe gear daily. -Mycotic toenails 1-5 bilaterally were debrided in length and girth  with sterile nail nippers and dremel without incident. -Patient/POA to call should there be question/concern in the interim.   Return in about 14 weeks (around 11/30/2021).  Marzetta Board, DPM

## 2021-08-30 DIAGNOSIS — D692 Other nonthrombocytopenic purpura: Secondary | ICD-10-CM | POA: Diagnosis not present

## 2021-08-30 DIAGNOSIS — L300 Nummular dermatitis: Secondary | ICD-10-CM | POA: Diagnosis not present

## 2021-08-30 DIAGNOSIS — D2271 Melanocytic nevi of right lower limb, including hip: Secondary | ICD-10-CM | POA: Diagnosis not present

## 2021-08-30 DIAGNOSIS — D225 Melanocytic nevi of trunk: Secondary | ICD-10-CM | POA: Diagnosis not present

## 2021-08-30 DIAGNOSIS — L218 Other seborrheic dermatitis: Secondary | ICD-10-CM | POA: Diagnosis not present

## 2021-08-30 DIAGNOSIS — L820 Inflamed seborrheic keratosis: Secondary | ICD-10-CM | POA: Diagnosis not present

## 2021-08-30 DIAGNOSIS — D1801 Hemangioma of skin and subcutaneous tissue: Secondary | ICD-10-CM | POA: Diagnosis not present

## 2021-09-06 ENCOUNTER — Ambulatory Visit: Payer: Medicare PPO | Admitting: Internal Medicine

## 2021-09-06 VITALS — BP 122/74 | HR 63 | Temp 98.4°F | Ht 67.0 in | Wt 119.0 lb

## 2021-09-06 DIAGNOSIS — J449 Chronic obstructive pulmonary disease, unspecified: Secondary | ICD-10-CM | POA: Diagnosis not present

## 2021-09-06 DIAGNOSIS — T783XXA Angioneurotic edema, initial encounter: Secondary | ICD-10-CM

## 2021-09-06 DIAGNOSIS — F419 Anxiety disorder, unspecified: Secondary | ICD-10-CM | POA: Diagnosis not present

## 2021-09-06 NOTE — Patient Instructions (Signed)
Please continue all other medications as before, including the claritin  Please have the pharmacy call with any other refills you may need.  Please continue your efforts at being more active, low cholesterol diet, and weight control.  Please keep your appointments with your specialists as you may have planned

## 2021-09-06 NOTE — Progress Notes (Signed)
Patient ID: Tina Lucas, female   DOB: 1950/05/26, 71 y.o.   MRN: [id number]        Chief Complaint: follow up cough and facial swelling, anxiety       HPI:  Tina Lucas is a 71 y.o. female here with ? Of bronchitis but symptoms are of acute onset mild diffuse swelling to facies, eyes and sinus congestion with post nasal gtt and cough , without fever, ST, and Pt denies chest pain, increased sob or doe, wheezing, orthopnea, PND, increased LE swelling, palpitations, dizziness or syncope.   Pt denies polydipsia, polyuria, or new focal neuro s/s.    Pt denies fever, wt loss, night sweats, loss of appetite, or other constitutional symptoms  Denies worsening depressive symptoms, suicidal ideation, or panic; has ongoing anxiety, does not want steroid tx today.        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   unknown trigger, neg autoimmune workup   Past Surgical History:  Procedure Laterality Date   31 HOUR [place] STUDY N/A 08/13/2019   Procedure: 24 HOUR PH STUDY- impedence;  Surgeon: Irving Copas., MD;  Location: [facility];  Service: Gastroenterology;  Laterality: N/A;   COLONOSCOPY     ESOPHAGEAL MANOMETRY N/A 08/13/2019   Procedure: ESOPHAGEAL MANOMETRY (EM);  Surgeon: Irving Copas., MD;  Location: [facility];  Service: Gastroenterology;  Laterality: N/A;   [place] IMPEDANCE STUDY  08/13/2019    Procedure: [place] IMPEDANCE STUDY;  Surgeon: Rush Landmark Telford Nab., MD;  Location: [facility];  Service: Gastroenterology;;   polyp on vocal chord      TONSILLECTOMY  1958    reports that she has never smoked. She has never used smokeless tobacco. She reports that she does not currently use alcohol. She reports that she does not use drugs. family history includes Atrial fibrillation in her brother; Breast cancer (age of onset: 56) in her mother; Hypertension in her father; Leukemia (age of onset: 42) in her maternal aunt; Osteoarthritis in her father. Allergies  Allergen Reactions   Singulair [Montelukast Sodium] Other (See Comments)    headaches   Current Outpatient Medications on File Prior to Visit  Medication Sig Dispense Refill   Calcium Carbonate-Vitamin D 600-400 MG-UNIT tablet Take 1 tablet by mouth daily.     CALTRATE 600+D3 SOFT 600-20 MG-MCG CHEW SMARTSIG:1 By Mouth     celecoxib (CELEBREX) 100 MG capsule Take 100 mg by mouth daily.     cholecalciferol (VITAMIN D) 400 UNITS TABS tablet Take 400 Units by mouth.     D 1000 25 MCG (1000 UT) capsule SMARTSIG:1 By Mouth     Digestive Enzymes (MULTI-ENZYME PO) Take by mouth daily before breakfast. Patient takes [employer]     fexofenadine (ALLEGRA) 180 MG tablet Take 180  mg by mouth daily.     fluocinonide cream (LIDEX) 8.24 % Apply 1 application topically 2 (two) times daily as needed (FOR PSORIASIS).      hydrocortisone 2.5 % cream      Hydrocortisone Butyr Lipo Base 0.1 % CREA      hydrOXYzine (ATARAX) 10 MG tablet      levalbuterol (XOPENEX HFA) 45 MCG/ACT inhaler INHALE ONE TO TWO PUFFS BY MOUTH EVERY 4 HOURS AS NEEDED FOR WHEEZING 15 g 2   levothyroxine (SYNTHROID) 50 MCG tablet TAKE ONE TABLET BY MOUTH DAILY WITH BREAKFAST 90 tablet 3   Magnesium 250 MG TABS Take 125 mg by mouth daily.     mometasone-formoterol (DULERA) 200-5 MCG/ACT AERO Inhale 2 puffs into the lungs in the morning and at bedtime. 13 g 11   Multiple  Vitamins-Minerals (ONE-A-DAY WOMENS 50+ ADVANTAGE PO) Take by mouth daily.     NEXIUM 40 MG capsule SMARTSIG:1 By Mouth     omeprazole (PRILOSEC) 40 MG capsule Take 40 mg by mouth at bedtime.     predniSONE (DELTASONE) 10 MG tablet Take 40 mg by mouth every morning.     Probiotic Product (PROBIOTIC PO) Take by mouth daily before breakfast. Patient takes Emiliano Dyer Probiotic Eleven (one at breakfast)     sertraline (ZOLOFT) 25 MG tablet TAKE ONE TABLET BY MOUTH AT BEDTIME 30 tablet 5   triamcinolone (KENALOG) 0.1 % paste      No current facility-administered medications on file prior to visit.        ROS:  All others reviewed and negative.  Objective        PE:  BP 122/74 (BP Location: Right Arm, Patient Position: Sitting, Cuff Size: Large)   Pulse 63   Temp 98.4 F (36.9 C) (Oral)   Ht '5\' 7"'$  (1.702 m)   Wt 119 lb (54 kg)   SpO2 99%   BMI 18.64 kg/m                 Constitutional: Pt appears in NAD, not ill apeparing               HENT: Head: NCAT.                Right Ear: External ear normal.                 Left Ear: External ear normal.                Eyes: . Pupils are equal, round, and reactive to light. Conjunctivae and EOM are normal               Nose: without d/c or deformity               Neck: Neck supple. Gross normal ROM               Cardiovascular: Normal rate and regular rhythm.                 Pulmonary/Chest: Effort normal and breath sounds without rales or wheezing.                Neurological: Pt is alert. At baseline orientation, motor grossly intact               Skin: Skin is warm, LE edema - none, has diffuse mild bilateral facies swelling nontender with mild weepy eye d/c  Psychiatric: Pt behavior is normal without agitation , mod nervous  Micro: none  Cardiac tracings I have personally interpreted today:  none  Pertinent Radiological findings (summarize): none   Lab Results  Component Value Date   WBC 5.1 04/06/2021   HGB 14.6  04/06/2021   HCT 42.4 04/06/2021   PLT 230.0 04/06/2021   GLUCOSE 100 (H) 04/06/2021   CHOL 209 (H) 04/06/2021   TRIG 47.0 04/06/2021   HDL 91.60 04/06/2021   LDLCALC 108 (H) 04/06/2021   ALT 25 04/06/2021   AST 26 04/06/2021   NA 136 04/06/2021   K 3.9 04/06/2021   CL 99 04/06/2021   CREATININE 0.73 04/06/2021   BUN 11 04/06/2021   CO2 30 04/06/2021   TSH 2.39 04/06/2021   INR 1.0 12/15/2014   HGBA1C 5.1 03/07/2017   Assessment/Plan:  Tina Lucas is a 71 y.o. White or Caucasian [1] female with  has a past medical history of Allergic rhinitis, Allergy, Asthma, Cataract, COPD (chronic obstructive pulmonary disease) ([place]), History of asthma (7412-8786), Hypothyroid (2013), Osteopenia, Primary HSV infection of mouth (2008), Pulmonary nodule, and Sunlight-induced angio-edema-urticaria (07/11/2011).  Allergic angioedema D/w pt - no evidence for bronchitis, this is allergy related, pt declines any type of steroid tx, ok for claritin 10 mg otc prn but also benadryl 50 mg q 6 prn as well  Asthma-COPD overlap syndrome (HCC) O/w stable, cont to follow  Anxiety At least moderate chronic it seem, delcines need for change in tx or referral at this time  Followup: Return if symptoms worsen or fail to improve.  Cathlean Cower, MD 09/10/2021 3:46 PM [facility]

## 2021-09-10 ENCOUNTER — Encounter: Payer: Self-pay | Admitting: Internal Medicine

## 2021-09-10 NOTE — Assessment & Plan Note (Signed)
D/w pt - no evidence for bronchitis, this is allergy related, pt declines any type of steroid tx, ok for claritin 10 mg otc prn but also benadryl 50 mg q 6 prn as well

## 2021-09-10 NOTE — Assessment & Plan Note (Signed)
O/w stable, cont to follow

## 2021-09-10 NOTE — Assessment & Plan Note (Signed)
At least moderate chronic it seem, delcines need for change in tx or referral at this time

## 2021-09-22 ENCOUNTER — Encounter: Payer: Self-pay | Admitting: Internal Medicine

## 2021-09-22 NOTE — Progress Notes (Signed)
Subjective:    Patient ID: Tina Lucas, female    DOB: March 19, 1950, 71 y.o.   MRN: [id number]     HPI Scheryl is here for follow from her visit here on 8/15 with Dr Jenny Reichmann.  She presented with bronchitis symptoms and mild diffuse swelling of face, eyes, PND sinus congestion and cough.  No fever, SOB, wheeze. She was diagnosed with allergic angioedema.  No evidence of bronchitis.  Declined steroid.  Advised benadryl, claritin.    August 12th - sinus/throat mucus - green color.  Lasted 2 weeks.  Still active.  This past week cough that is dry and increased hoarseness.  She denies any real sore throat.  The cough is dry and minimal.  She has no shortness of breath, wheezing or fevers.   GERD- taking probiotic and digestive enzyme.  She has been able to cut down to the Prilosec just once a week.  She would like to try something like Pepcid again which is not as strong.  Her goal is to try to get off this medication.  She is also cut down on the sertraline and is only taking half a pill daily.  Medications and allergies reviewed with patient and updated if appropriate.  Current Outpatient Medications on File Prior to Visit  Medication Sig Dispense Refill   Calcium Carbonate-Vitamin D 600-400 MG-UNIT tablet Take 1 tablet by mouth daily.     CALTRATE 600+D3 SOFT 600-20 MG-MCG CHEW SMARTSIG:1 By Mouth     celecoxib (CELEBREX) 100 MG capsule Take 100 mg by mouth daily.     cholecalciferol (VITAMIN D) 400 UNITS TABS tablet Take 400 Units by mouth.     D 1000 25 MCG (1000 UT) capsule SMARTSIG:1 By Mouth     Digestive Enzymes (MULTI-ENZYME PO) Take by mouth daily before breakfast. Patient takes [employer]     fexofenadine (ALLEGRA) 180 MG tablet Take 180 mg by mouth daily.     fluocinonide cream (LIDEX) 9.41 % Apply 1 application topically 2 (two) times daily as needed (FOR PSORIASIS).      hydrocortisone 2.5 % cream      Hydrocortisone Butyr Lipo Base 0.1 % CREA      hydrOXYzine  (ATARAX) 10 MG tablet      levalbuterol (XOPENEX HFA) 45 MCG/ACT inhaler INHALE ONE TO TWO PUFFS BY MOUTH EVERY 4 HOURS AS NEEDED FOR WHEEZING 15 g 2   levothyroxine (SYNTHROID) 50 MCG tablet TAKE ONE TABLET BY MOUTH DAILY WITH BREAKFAST 90 tablet 3   Magnesium 250 MG TABS Take 125 mg by mouth daily.     mometasone-formoterol (DULERA) 200-5 MCG/ACT AERO Inhale 2 puffs into the lungs in the morning and at bedtime. 13 g 11   Multiple Vitamins-Minerals (ONE-A-DAY WOMENS 50+ ADVANTAGE PO) Take by mouth daily.     NEXIUM 40 MG capsule SMARTSIG:1 By Mouth     omeprazole (PRILOSEC) 40 MG capsule Take 40 mg by mouth at bedtime.     predniSONE (DELTASONE) 10 MG tablet Take 40 mg by mouth every morning.     Probiotic Product (PROBIOTIC PO) Take by mouth daily before breakfast. Patient takes Emiliano Dyer Probiotic Eleven (one at breakfast)     sertraline (ZOLOFT) 25 MG tablet TAKE ONE TABLET BY MOUTH AT BEDTIME 30 tablet 5   triamcinolone (KENALOG) 0.1 % paste      No current facility-administered medications on file prior to visit.     Review of Systems  Constitutional:  Negative for  fever.  HENT:  Positive for congestion (mild) and voice change (this week). Negative for ear pain, postnasal drip and sore throat.   Respiratory:  Positive for cough (minimal dry cough). Negative for shortness of breath and wheezing.   Cardiovascular:  Negative for chest pain.  Skin:  Positive for rash.  Neurological:  Negative for light-headedness and headaches.       Objective:   Vitals:   09/23/21 1444 09/23/21 1448  BP: (!) 146/80 (!) 142/80  Pulse: 69   Temp: 98 F (36.7 C)   SpO2: 98%    BP Readings from Last 3 Encounters:  09/23/21 (!) 142/80  09/06/21 122/74  05/06/21 120/74   Wt Readings from Last 3 Encounters:  09/23/21 123 lb (55.8 kg)  09/06/21 119 lb (54 kg)  05/06/21 121 lb (54.9 kg)   Body mass index is 19.26 kg/m.    Physical Exam Constitutional:      General: She is not in  acute distress.    Appearance: Normal appearance. She is not ill-appearing.  HENT:     Head: Normocephalic and atraumatic.     Right Ear: Tympanic membrane, ear canal and external ear normal.     Left Ear: Tympanic membrane, ear canal and external ear normal.     Mouth/Throat:     Mouth: Mucous membranes are moist.     Pharynx: No oropharyngeal exudate or posterior oropharyngeal erythema.  Eyes:     Conjunctiva/sclera: Conjunctivae normal.  Cardiovascular:     Rate and Rhythm: Normal rate and regular rhythm.  Pulmonary:     Effort: Pulmonary effort is normal. No respiratory distress.     Breath sounds: Normal breath sounds. No wheezing or rales.  Musculoskeletal:     Cervical back: Neck supple. No tenderness.  Lymphadenopathy:     Cervical: No cervical adenopathy.  Skin:    General: Skin is warm and dry.  Neurological:     Mental Status: She is alert.        Lab Results  Component Value Date   WBC 5.1 04/06/2021   HGB 14.6 04/06/2021   HCT 42.4 04/06/2021   PLT 230.0 04/06/2021   GLUCOSE 100 (H) 04/06/2021   CHOL 209 (H) 04/06/2021   TRIG 47.0 04/06/2021   HDL 91.60 04/06/2021   LDLCALC 108 (H) 04/06/2021   ALT 25 04/06/2021   AST 26 04/06/2021   NA 136 04/06/2021   K 3.9 04/06/2021   CL 99 04/06/2021   CREATININE 0.73 04/06/2021   BUN 11 04/06/2021   CO2 30 04/06/2021   TSH 2.39 04/06/2021   INR 1.0 12/15/2014   HGBA1C 5.1 03/07/2017     Assessment & Plan:    See Problem List for Assessment and Plan of chronic medical problems.

## 2021-09-23 ENCOUNTER — Ambulatory Visit: Payer: Medicare PPO | Admitting: Internal Medicine

## 2021-09-23 DIAGNOSIS — F419 Anxiety disorder, unspecified: Secondary | ICD-10-CM

## 2021-09-23 DIAGNOSIS — R490 Dysphonia: Secondary | ICD-10-CM | POA: Diagnosis not present

## 2021-09-23 DIAGNOSIS — R12 Heartburn: Secondary | ICD-10-CM | POA: Diagnosis not present

## 2021-09-23 MED ORDER — FAMOTIDINE 40 MG PO TABS: 40.0000 mg | ORAL_TABLET | Freq: Every day | ORAL | 1 refills | Status: DC

## 2021-09-23 MED ORDER — SERTRALINE HCL 25 MG PO TABS: 12.5000 mg | ORAL_TABLET | Freq: Every day | ORAL | 5 refills | Status: DC

## 2021-09-23 NOTE — Assessment & Plan Note (Signed)
Chronic Currently taking sertraline 12.5 mg daily-we will continue.  She can continue to taper down if she wishes

## 2021-09-23 NOTE — Assessment & Plan Note (Signed)
Chronic Improved Currently taking Prilosec 40 mg once a week and probiotics and digestive enzymes daily-those have helped her get down on the Prilosec dose Also taking sertraline 12.5 mg daily-she has decreased from 25 mg daily We will try Pepcid 40 mg instead of the Prilosec to see if that helps her get off the Prilosec.  Discussed she may need to increase the Pepcid slightly when she stops the Prilosec, but this is better than taking the Prilosec and can slowly taper down off the Pepcid which is weaker Continue digestive enzymes and probiotics She will let me know if she has any questions or concerns Pepcid 40 mg daily sent to pharmacy

## 2021-09-23 NOTE — Patient Instructions (Addendum)
     Medications changes include :   pepcid 40 mg      Your prescription(s) have been sent to your pharmacy.

## 2021-09-23 NOTE — Assessment & Plan Note (Signed)
Acute Likely related to recent URI It sounds like her symptoms were relatively mild No treatment needed-recommended voice rest and symptomatic treatment for any residual symptoms

## 2021-10-03 ENCOUNTER — Other Ambulatory Visit: Payer: Self-pay | Admitting: Pulmonary Disease

## 2021-10-19 ENCOUNTER — Other Ambulatory Visit: Payer: Self-pay | Admitting: Internal Medicine

## 2021-11-09 DIAGNOSIS — L2089 Other atopic dermatitis: Secondary | ICD-10-CM | POA: Diagnosis not present

## 2021-11-09 DIAGNOSIS — L309 Dermatitis, unspecified: Secondary | ICD-10-CM | POA: Diagnosis not present

## 2021-11-09 DIAGNOSIS — L570 Actinic keratosis: Secondary | ICD-10-CM | POA: Diagnosis not present

## 2021-11-09 DIAGNOSIS — D485 Neoplasm of uncertain behavior of skin: Secondary | ICD-10-CM | POA: Diagnosis not present

## 2021-11-09 DIAGNOSIS — L300 Nummular dermatitis: Secondary | ICD-10-CM | POA: Diagnosis not present

## 2021-11-27 ENCOUNTER — Encounter: Payer: Self-pay | Admitting: Internal Medicine

## 2021-11-27 NOTE — Progress Notes (Signed)
Subjective:    Patient ID: Tina Lucas, female    DOB: May 16, 1950, 71 y.o.   MRN: [id number]      HPI Tina Lucas is here for  Chief Complaint  Patient presents with   Referrals    Wants referral for PT for right hip/upper leg (note-card with physician's she wants to go to)     5 weeks  right lateral hip pain- pain with activity - walking, pickleball, certain exercises at gym.  She is very active and exercising on a daily basis.  She can feel the pain when laying on that side or with most of her activities.  She has been using Voltaren gel once a day and not every day.  No buttock muscle pain, back pain.  No radiation down the leg.    Medications and allergies reviewed with patient and updated if appropriate.  Current Outpatient Medications on File Prior to Visit  Medication Sig Dispense Refill   celecoxib (CELEBREX) 100 MG capsule Take 100 mg by mouth daily.     D 1000 25 MCG (1000 UT) capsule SMARTSIG:1 By Mouth     Digestive Enzymes (MULTI-ENZYME PO) Take by mouth daily before breakfast. Patient takes [employer]     famotidine (PEPCID) 40 MG tablet Take 1 tablet (40 mg total) by mouth daily. 90 tablet 1   fexofenadine (ALLEGRA) 180 MG tablet Take 180 mg by mouth daily.     fluocinonide cream (LIDEX) 0.05 % Apply 1 application topically 2 (two) times daily as needed (FOR PSORIASIS).      hydrocortisone 2.5 % cream      levalbuterol (XOPENEX HFA) 45 MCG/ACT inhaler INHALE 1 TO 2 PUFFS BY MOUTH EVERY 4 HOURS AS NEEDED FOR WHEEZING 15 g 2   levothyroxine (SYNTHROID) 50 MCG tablet TAKE ONE TABLET BY MOUTH DAILY WITH BREAKFAST 90 tablet 3   Magnesium 250 MG TABS Take 125 mg by mouth daily.     Melatonin 5 MG CHEW Chew by mouth at bedtime.     mometasone-formoterol (DULERA) 200-5 MCG/ACT AERO Inhale 2 puffs into the lungs in the morning and at bedtime. 13 g 11   Multiple Vitamins-Minerals (ONE-A-DAY WOMENS 50+ ADVANTAGE PO) Take by mouth daily.     Probiotic Product  (PROBIOTIC PO) Take by mouth daily before breakfast. Patient takes Shelbie Ammons Probiotic Eleven (one at breakfast)     triamcinolone (KENALOG) 0.1 % paste      No current facility-administered medications on file prior to visit.    Review of Systems     Objective:   Vitals:   11/29/21 0851  BP: 122/72  Pulse: 62  Temp: 97.9 F (36.6 C)  SpO2: 99%   BP Readings from Last 3 Encounters:  11/29/21 122/72  09/23/21 (!) 142/80  09/06/21 122/74   Wt Readings from Last 3 Encounters:  11/29/21 123 lb 3.2 oz (55.9 kg)  09/23/21 123 lb (55.8 kg)  09/06/21 119 lb (54 kg)   Body mass index is 19.3 kg/m.    Physical Exam Constitutional:      General: She is not in acute distress.    Appearance: Normal appearance. She is not ill-appearing.  HENT:     Head: Normocephalic and atraumatic.  Musculoskeletal:        General: No tenderness (No tenderness lateral right hip/trochanter or proximal lateral right upper leg, no right buttock tenderness or lower back tenderness).  Skin:    General: Skin is warm and dry.  Findings: No bruising, erythema, lesion or rash.  Neurological:     Mental Status: She is alert.     Sensory: No sensory deficit.     Motor: No weakness.            Assessment & Plan:    See Problem List for Assessment and Plan of chronic medical problems.

## 2021-11-28 DIAGNOSIS — L309 Dermatitis, unspecified: Secondary | ICD-10-CM | POA: Diagnosis not present

## 2021-11-28 DIAGNOSIS — L308 Other specified dermatitis: Secondary | ICD-10-CM | POA: Diagnosis not present

## 2021-11-28 DIAGNOSIS — L570 Actinic keratosis: Secondary | ICD-10-CM | POA: Diagnosis not present

## 2021-11-28 DIAGNOSIS — L2089 Other atopic dermatitis: Secondary | ICD-10-CM | POA: Diagnosis not present

## 2021-11-28 DIAGNOSIS — L239 Allergic contact dermatitis, unspecified cause: Secondary | ICD-10-CM | POA: Diagnosis not present

## 2021-11-28 DIAGNOSIS — D692 Other nonthrombocytopenic purpura: Secondary | ICD-10-CM | POA: Diagnosis not present

## 2021-11-29 ENCOUNTER — Ambulatory Visit: Payer: Medicare PPO | Admitting: Internal Medicine

## 2021-11-29 VITALS — BP 122/72 | HR 62 | Temp 97.9°F | Ht 67.0 in | Wt 123.2 lb

## 2021-11-29 DIAGNOSIS — M25551 Pain in right hip: Secondary | ICD-10-CM

## 2021-11-29 NOTE — Patient Instructions (Addendum)
You may have hip bursitis.        Use the voltaren gel on the area twice a day.     Hip Bursitis Rehab Ask your health care provider which exercises are safe for you. Do exercises exactly as told by your health care provider and adjust them as directed. It is normal to feel mild stretching, pulling, tightness, or discomfort as you do these exercises. Stop right away if you feel sudden pain or your pain gets worse. Do not begin these exercises until told by your health care provider. Stretching exercise This exercise warms up your muscles and joints and improves the movement and flexibility of your hip. This exercise also helps to relieve pain and stiffness. Iliotibial band stretch An iliotibial band is a strong band of muscle tissue that runs from the outer side of your hip to the outer side of your thigh and knee. Lie on your side with your left / right leg in the top position. Bend your left / right knee and grab your ankle. Stretch out your bottom arm to help you balance. Slowly bring your knee back so your thigh is slightly behind your body. Slowly lower your knee toward the floor until you feel a gentle stretch on the outside of your left / right thigh. If you do not feel a stretch and your knee will not lower more toward the floor, place the heel of your other foot on top of your knee and pull your knee down toward the floor with your foot. Hold this position for __________ seconds. Slowly return to the starting position. Repeat __________ times. Complete this exercise __________ times a day. Strengthening exercises These exercises build strength and endurance in your hip and pelvis. Endurance is the ability to use your muscles for a long time, even after they get tired. Bridge This exercise strengthens the muscles that move your thigh backward (hip extensors). Lie on your back on a firm surface with your knees bent and your feet flat on the floor. Tighten your buttocks muscles and  lift your buttocks off the floor until your trunk is level with your thighs. Do not arch your back. You should feel the muscles working in your buttocks and the back of your thighs. If you do not feel these muscles, slide your feet 1-2 inches (2.5-5 cm) farther away from your buttocks. If this exercise is too easy, try doing it with your arms crossed over your chest. Hold this position for __________ seconds. Slowly lower your hips to the starting position. Let your muscles relax completely after each repetition. Repeat __________ times. Complete this exercise __________ times a day. Squats This exercise strengthens the muscles in front of your thigh and knee (quadriceps). Stand in front of a table, with your feet and knees pointing straight ahead. You may rest your hands on the table for balance but not for support. Slowly bend your knees and lower your hips like you are going to sit in a chair. Keep your weight over your heels, not over your toes. Keep your lower legs upright so they are parallel with the table legs. Do not let your hips go lower than your knees. Do not bend lower than told by your health care provider. If your hip pain increases, do not bend as low. Hold the squat position for __________ seconds. Slowly push with your legs to return to standing. Do not use your hands to pull yourself to standing. Repeat __________ times. Complete this  exercise __________ times a day. Hip hike  Stand sideways on a bottom step. Stand on your left / right leg with your other foot unsupported next to the step. You can hold on to the railing or wall for balance if needed. Keep your knees straight and your torso square. Then lift your left / right hip up toward the ceiling. Hold this position for __________ seconds. Slowly let your left / right hip lower toward the floor, past the starting position. Your foot should get closer to the floor. Do not lean or bend your knees. Repeat __________  times. Complete this exercise __________ times a day. Single leg stand This exercise increases your balance. Without shoes, stand near a railing or in a doorway. You may hold on to the railing or door frame as needed for balance. Squeeze your left / right buttock muscles, then lift up your other foot. Do not let your left / right hip push out to the side. It is helpful to stand in front of a mirror for this exercise so you can watch your hip. Hold this position for __________ seconds. Repeat __________ times. Complete this exercise __________ times a day. This information is not intended to replace advice given to you by your health care provider. Make sure you discuss any questions you have with your health care provider. Document Revised: 12/22/2020 Document Reviewed: 12/22/2020 Elsevier Patient Education  [place].

## 2021-11-29 NOTE — Assessment & Plan Note (Signed)
Started about 5 weeks ago Possible bursitis She is exercising constantly and likely an overuse issue Exercise given Will refer to PT Continue Voltaren gel-advised to apply at least twice a day every day to see if that helps calm down the inflammation Revise activities If there is no improvement she will let me know and I can refer to Ortho

## 2021-12-07 ENCOUNTER — Encounter: Payer: Self-pay | Admitting: Podiatry

## 2021-12-07 ENCOUNTER — Ambulatory Visit: Payer: Medicare PPO | Admitting: Podiatry

## 2021-12-07 DIAGNOSIS — M79674 Pain in right toe(s): Secondary | ICD-10-CM

## 2021-12-07 DIAGNOSIS — M79675 Pain in left toe(s): Secondary | ICD-10-CM | POA: Diagnosis not present

## 2021-12-07 DIAGNOSIS — B351 Tinea unguium: Secondary | ICD-10-CM | POA: Diagnosis not present

## 2021-12-12 NOTE — Progress Notes (Signed)
  Subjective:  Patient ID: Tina Lucas, female    DOB: 1950/10/01,  MRN: [id number]  Tina Lucas presents to clinic today for:  Chief Complaint  Patient presents with   Nail Problem    Routine foot care PCP-Stacy Burns PCP VST-11/29/2021   PCP is Binnie Rail, MD.  Allergies  Allergen Reactions   Singulair [Montelukast Sodium] Other (See Comments)    headaches    Review of Systems: Negative except as noted in the HPI.  Objective: No changes noted in today's physical examination. Tina Lucas is a pleasant 71 y.o. female in NAD. AAO x 3.  Vascular Examination: Capillary refill time <3 seconds b/l LE. Palpable pedal pulses b/l LE. Digital hair present b/l. No pedal edema b/l. Skin temperature gradient WNL b/l. No varicosities b/l. Marland Kitchen  Dermatological Examination: Pedal skin with normal turgor, texture and tone b/l. No open wounds. No interdigital macerations b/l. Toenails 1-5 b/l thickened, discolored, dystrophic with subungual debris. There is pain on palpation to dorsal aspect of nailplates. .  Neurological Examination: Protective sensation intact with 10 gram monofilament b/l LE. Vibratory sensation intact b/l LE.   Musculoskeletal Examination: Muscle strength 5/5 to all LE muscle groups b/l. HAV with bunion deformity noted b/l LE.  Assessment/Plan: 1. Pain due to onychomycosis of toenails of both feet     No orders of the defined types were placed in this encounter.   -Consent given for treatment as described below: -Continue supportive shoe gear daily. -Toenails 1-5 b/l were debrided in length and girth with sterile nail nippers and dremel without iatrogenic bleeding.  -Patient/POA to call should there be question/concern in the interim.   No follow-ups on file.  Marzetta Board, DPM

## 2021-12-23 DIAGNOSIS — L309 Dermatitis, unspecified: Secondary | ICD-10-CM | POA: Diagnosis not present

## 2021-12-23 DIAGNOSIS — L282 Other prurigo: Secondary | ICD-10-CM | POA: Diagnosis not present

## 2022-01-11 DIAGNOSIS — L299 Pruritus, unspecified: Secondary | ICD-10-CM | POA: Diagnosis not present

## 2022-01-11 DIAGNOSIS — L905 Scar conditions and fibrosis of skin: Secondary | ICD-10-CM | POA: Diagnosis not present

## 2022-01-11 DIAGNOSIS — L570 Actinic keratosis: Secondary | ICD-10-CM | POA: Diagnosis not present

## 2022-01-11 DIAGNOSIS — L309 Dermatitis, unspecified: Secondary | ICD-10-CM | POA: Diagnosis not present

## 2022-01-17 NOTE — Therapy (Addendum)
OUTPATIENT PHYSICAL THERAPY LOWER EXTREMITY EVALUATION   Patient Name: Tina Lucas MRN: [id number] DOB:11-May-1950, 71 y.o., female Today's Date: 01/18/2022  END OF SESSION:  PT End of Session - 01/18/22 1115     Visit Number 1    Date for PT Re-Evaluation 05/10/22    Authorization Type [employer]    PT Start Time 1016    PT Stop Time 1115    PT Time Calculation (min) 59 min    Activity Tolerance Patient tolerated treatment well    Behavior During Therapy WFL for tasks assessed/performed             Past Medical History:  Diagnosis Date   Allergic rhinitis    Allergy    Asthma    as a child   Cataract    surgical correction, 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   COPD (chronic obstructive pulmonary disease) ([place])    History of asthma 1956-2000   Hypothyroid 2013   Osteopenia    Primary HSV infection of mouth 2008   Pulmonary nodule    Sunlight-induced angio-edema-urticaria 07/11/2011   unknown trigger, neg autoimmune workup   Past Surgical History:  Procedure Laterality Date   49 HOUR [place] STUDY N/A 08/13/2019   Procedure: 24 HOUR PH STUDY- impedence;  Surgeon: Irving Copas., MD;  Location: [facility];  Service: Gastroenterology;  Laterality: N/A;   COLONOSCOPY     ESOPHAGEAL MANOMETRY N/A 08/13/2019   Procedure: ESOPHAGEAL MANOMETRY (EM);  Surgeon: Irving Copas., MD;  Location: [facility];  Service: Gastroenterology;  Laterality: N/A;   [place] IMPEDANCE STUDY  08/13/2019   Procedure: [place] IMPEDANCE STUDY;  Surgeon: Irving Copas., MD;  Location: [facility];  Service: Gastroenterology;;   polyp on vocal chord      TONSILLECTOMY  1958   Patient Active Problem List   Diagnosis Date Noted   Lateral pain of right hip 11/29/2021   Hoarseness 09/23/2021   Allergic angioedema 09/06/2021   Acute pain of right shoulder 05/06/2021   Anxiety 04/06/2021   Dextroscoliosis 04/06/2021   Senile purpura ([place]) 03/31/2020   Hiatal hernia 06/15/2019    Functional heartburn 06/15/2019   Abnormal findings on esophagogastroduodenoscopy (EGD) 04/26/2018   Atypical chest pain 02/26/2018   Asthma-COPD overlap syndrome 01/31/2018   Osteopenia 12/10/2014   Allergic urticaria 12/10/2014   Hypothyroid    Primary HSV infection of mouth     PCP: Binnie Rail, MD  REFERRING PROVIDER: Binnie Rail, MD  REFERRING DIAG: [phone] (ICD-10-CM) - Lateral pain of right hip  THERAPY DIAG:  Pain in right hip  Difficulty in walking, not elsewhere classified  Other muscle spasm  Rationale for Evaluation and Treatment: Rehabilitation  ONSET DATE: October 2023  SUBJECTIVE:   SUBJECTIVE STATEMENT: I have been able to play pickleball, workout, walking 3-4. Walking, pickleball, gym exercises causing increased lateral hip and upper leg pain. Using Voltaren on hip and hands before activities. Piriformis stretch on Rt hurts the greater trochanter.  Pt has osteoporosis -2.9T score.  Pt states hip hurts lying on it at night and sometimes it hurts when standing after sitting.  Pt is sitting 45-60 min when that happens.  PERTINENT HISTORY: Osteoporosis, some autoimmune symptoms, COPD (mild),  PAIN:  Are you having pain? Yes NPRS scale: 5/10 Pain location: hip Pain orientation: Right  PAIN TYPE: aching, bruised Pain description: intermittent  Aggravating factors: sleeping, sometimes after sitting a while Relieving factors: moving  PRECAUTIONS: None  WEIGHT BEARING RESTRICTIONS: No  FALLS:  Has patient fallen in last 6 months? No  LIVING ENVIRONMENT: Lives with: lives with their spouse Lives in: House/apartment   OCCUPATION: retired - Market researcher,   PLOF: Independent  PATIENT GOALS: be able to sleep on Rt side and not have hip pain  NEXT MD VISIT: NA  OBJECTIVE:   DIAGNOSTIC FINDINGS:   PATIENT SURVEYS:  FOTO = 78  COGNITION: Overall cognitive status: Within functional limits for tasks assessed     SENSATION: Not  tested  EDEMA:    MUSCLE LENGTH: Hamstrings: 20-30 deg knee bend with toe touch Thomas test:  POSTURE: rounded shoulders, decreased lumbar lordosis, increased thoracic kyphosis, flexed trunk , and scoliosis  PALPATION: Tender around greater trochanter on the Rt side; IT band and TFL tenderness  LOWER EXTREMITY ROM:  Passive ROM Right eval Left eval  Hip flexion    Hip extension    Hip abduction    Hip adduction +pain   Hip internal rotation    Hip external rotation 75% +pain   Knee flexion    Knee extension    Ankle dorsiflexion    Ankle plantarflexion    Ankle inversion    Ankle eversion     (Blank rows = not tested) LUMBAR ROM:   Active  A/PROM  eval  Flexion WFL +pain coming up  Extension WFL   Right lateral flexion pain  Left lateral flexion pain  Right rotation WFL   Left rotation WFL    (Blank rows = not tested)  LOWER EXTREMITY MMT:  MMT Right eval Left eval  Hip flexion    Hip extension 4/5 5/5  Hip abduction 4/5 5/5  Hip adduction 5/5 5/5  Hip internal rotation 4/5 5/5  Hip external rotation 4/5 5/5  Knee flexion    Knee extension    Ankle dorsiflexion    Ankle plantarflexion    Ankle inversion    Ankle eversion     (Blank rows = not tested)  LOWER EXTREMITY SPECIAL TESTS:  Hip special tests: Saralyn Pilar (FABER) test: positive  for pain on Rt lateral hip  FUNCTIONAL TESTS:  Single leg stand 10 sec bil - trendelenburg and needs UE 1x for Rt side  GAIT: Distance walked: from gym to room 4 Assistive device utilized: no Level of assistance: Complete Independence Comments: flexed trunk, internal hip rotation Rt>Lt   TODAY'S TREATMENT:                                                                                                                              DATE: 01/18/22           Eval and educated and performed intial HEP as detailed below  PATIENT EDUCATION:  Education details: Access Code: [email or web address] Person educated:  Patient Education method: Explanation, Demonstration, Corporate treasurer cues, Verbal cues, and Handouts Education comprehension: verbalized understanding and returned demonstration  HOME EXERCISE PROGRAM: Access Code: [email or web address] URL: [email or web address] Date: 01/18/2022 Prepared by: Jari Favre  Exercises -  Push-Up on Counter  - 1 x daily - 7 x weekly - 2-3 sets - 10 reps - Plank on Counter  - 1 x daily - 7 x weekly - 2-3 sets - 10 reps - Standing Lumbar Extension at [place]  - 1 x daily - 7 x weekly - 3 sets - 10 reps - Standing with Forearms Thoracic Rotation  - 1 x daily - 7 x weekly - 3 sets - 10 reps - Marching Bridge  - 1 x daily - 7 x weekly - 1 sets - 10 reps - Bridge with Hip Abduction and Resistance  - 1 x daily - 7 x weekly - 3 sets - 10 reps  ASSESSMENT:  CLINICAL IMPRESSION: Patient is a 71 y.o. female who was seen today for physical therapy evaluation and treatment for Rt lateral hip pain. Pt has tenderness to greater trocanteric bursa on the Rt side.  She has weakness and some pain with Rt ER and abduction.  Pt has some pain with TFL and IT band stretches as well as Rt lumbar side bending.  Pt has some decreased pain with lumbar extension.  Pt is able to do normal activities but sleeping causes the most discomfort.  Pt appears to have greater leg length on Rt side.  She has less stability on Rt with lateral lean in single leg stand and takes longer to get her balance on the Rt side.  Pt will benefit from skilled PT to address impairments for pain management so she can maintain her active lifestyle. Pt has high level of motivation and due to busy schedule her appointments will be spaced out so duration of plan of care is expected to be 16 weeks.  OBJECTIVE IMPAIRMENTS: decreased endurance, difficulty walking, decreased ROM, decreased strength, postural dysfunction, and pain.   ACTIVITY LIMITATIONS: sitting, sleeping, and exercise  PARTICIPATION  LIMITATIONS: community activity  PERSONAL FACTORS: Age, Time since onset of injury/illness/exacerbation, and 1-2 comorbidities: COPD, arthritis, osteoporosis  are also affecting patient's functional outcome.   REHAB POTENTIAL: Excellent  CLINICAL DECISION MAKING: Stable/uncomplicated  EVALUATION COMPLEXITY: Low   GOALS: Goals reviewed with patient? Yes  SHORT TERM GOALS: Target date: 02/15/22 Ind with initial HEP Baseline: Goal status: INITIAL  LONG TERM GOALS: Target date: 05/10/22  Pt will be independent with advanced HEP to maintain improvements made throughout therapy  Baseline:  Goal status: INITIAL  2.  FOTO is 80 or higher based on predicted outcomes Baseline:  Goal status: INITIAL  3.  Pt will have 5/5 Rt hip abduction and ER for improved stability during pickleball Baseline:  Goal status: INITIAL  4.  Pt will be able to sleep on Rt side for part of the night without pain. Baseline:  Goal status: INITIAL  5.  Pt will have at least 3/4 days that are pain free during typical activities. Baseline:  Goal status: INITIAL   PLAN:  PT FREQUENCY: every other week or monthly  PT DURATION:  6 sessions  PLANNED INTERVENTIONS: Therapeutic exercises, Therapeutic activity, Neuromuscular re-education, Balance training, Gait training, Patient/Family education, Self Care, Joint mobilization, Dry Needling, Electrical stimulation, Cryotherapy, Moist heat, Taping, Traction, Biofeedback, Manual therapy, and Re-evaluation, Iontophoresis, ultrasound)  PLAN FOR NEXT SESSION: single leg stability and lumbar multifidi strength, review initial HEP   Jakki L Amra Shukla, PT 01/18/2022, 11:21 AM

## 2022-01-18 ENCOUNTER — Other Ambulatory Visit: Payer: Self-pay

## 2022-01-18 ENCOUNTER — Ambulatory Visit: Payer: Medicare PPO | Attending: Internal Medicine | Admitting: Physical Therapy

## 2022-01-18 DIAGNOSIS — R262 Difficulty in walking, not elsewhere classified: Secondary | ICD-10-CM | POA: Insufficient documentation

## 2022-01-18 DIAGNOSIS — M25551 Pain in right hip: Secondary | ICD-10-CM | POA: Diagnosis not present

## 2022-01-18 DIAGNOSIS — M62838 Other muscle spasm: Secondary | ICD-10-CM | POA: Diagnosis not present

## 2022-02-17 ENCOUNTER — Telehealth: Payer: Self-pay | Admitting: Internal Medicine

## 2022-02-17 DIAGNOSIS — J310 Chronic rhinitis: Secondary | ICD-10-CM | POA: Diagnosis not present

## 2022-02-17 DIAGNOSIS — J449 Chronic obstructive pulmonary disease, unspecified: Secondary | ICD-10-CM | POA: Diagnosis not present

## 2022-02-17 DIAGNOSIS — R21 Rash and other nonspecific skin eruption: Secondary | ICD-10-CM | POA: Diagnosis not present

## 2022-02-17 MED ORDER — SERTRALINE HCL 25 MG PO TABS: 25.0000 mg | ORAL_TABLET | Freq: Every day | ORAL | 3 refills | Status: DC

## 2022-02-17 NOTE — Telephone Encounter (Signed)
Sent to pharmacy 

## 2022-02-17 NOTE — Telephone Encounter (Signed)
Called pt and let her know the prescription was sent.

## 2022-02-17 NOTE — Telephone Encounter (Signed)
Patient had medication removed from her medication list and would like it to be refilled. She is wanting to try it again.  Callback at [phone]  Sertraline hcl '25mg'$  tablet issued 04/22/21

## 2022-02-22 ENCOUNTER — Encounter: Payer: Self-pay | Admitting: Physical Therapy

## 2022-02-22 ENCOUNTER — Ambulatory Visit: Payer: Medicare PPO | Attending: Internal Medicine | Admitting: Physical Therapy

## 2022-02-22 DIAGNOSIS — R262 Difficulty in walking, not elsewhere classified: Secondary | ICD-10-CM | POA: Diagnosis not present

## 2022-02-22 DIAGNOSIS — M25551 Pain in right hip: Secondary | ICD-10-CM

## 2022-02-22 DIAGNOSIS — M62838 Other muscle spasm: Secondary | ICD-10-CM | POA: Diagnosis not present

## 2022-02-22 NOTE — Addendum Note (Signed)
Addended by: Su Hoff on: 02/22/2022 02:02 PM   Modules accepted: Orders

## 2022-02-22 NOTE — Therapy (Signed)
OUTPATIENT PHYSICAL THERAPY LOWER EXTREMITY TREATMENT   Patient Name: Tina Lucas MRN: [id number] DOB:1950-10-14, 72 y.o., female Today's Date: 02/22/2022  END OF SESSION:  PT End of Session - 02/22/22 0932     Visit Number 2    Date for PT Re-Evaluation 05/10/22    Authorization Type [employer]-  6 visits until 05/10/22    PT Start Time 0930    PT Stop Time 1010    PT Time Calculation (min) 40 min    Activity Tolerance Patient tolerated treatment well    Behavior During Therapy [facility] for tasks assessed/performed              Past Medical History:  Diagnosis Date   Allergic rhinitis    Allergy    Asthma    as a child   Cataract    surgical correction, 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   COPD (chronic obstructive pulmonary disease) ([place])    History of asthma 1956-2000   Hypothyroid 2013   Osteopenia    Primary HSV infection of mouth 2008   Pulmonary nodule    Sunlight-induced angio-edema-urticaria 07/11/2011   unknown trigger, neg autoimmune workup   Past Surgical History:  Procedure Laterality Date   64 HOUR [place] STUDY N/A 08/13/2019   Procedure: 24 HOUR PH STUDY- impedence;  Surgeon: Irving Copas., MD;  [facility];  Service: Gastroenterology;  Laterality: N/A;   COLONOSCOPY     ESOPHAGEAL MANOMETRY N/A 08/13/2019   Procedure: ESOPHAGEAL MANOMETRY (EM);  Surgeon: Irving Copas., MD;  [facility];  Service: Gastroenterology;  Laterality: N/A;   [place] IMPEDANCE STUDY  08/13/2019   Procedure: [place] IMPEDANCE STUDY;  Surgeon: Irving Copas., MD;  [facility];  Service: Gastroenterology;;   polyp on vocal chord      TONSILLECTOMY  1958   Patient Active Problem List   Diagnosis Date Noted   Lateral pain of right hip 11/29/2021   Hoarseness 09/23/2021   Allergic angioedema 09/06/2021   Acute pain of right shoulder 05/06/2021   Anxiety 04/06/2021   Dextroscoliosis 04/06/2021   Senile purpura ([place]) 03/31/2020    Hiatal hernia 06/15/2019   Functional heartburn 06/15/2019   Abnormal findings on esophagogastroduodenoscopy (EGD) 04/26/2018   Atypical chest pain 02/26/2018   Asthma-COPD overlap syndrome 01/31/2018   Osteopenia 12/10/2014   Allergic urticaria 12/10/2014   Hypothyroid    Primary HSV infection of mouth     PCP: Binnie Rail, MD  REFERRING PROVIDER: Binnie Rail, MD  REFERRING DIAG: [phone] (ICD-10-CM) - Lateral pain of right hip  THERAPY DIAG:  Pain in right hip  Difficulty in walking, not elsewhere classified  Other muscle spasm  Rationale for Evaluation and Treatment: Rehabilitation  ONSET DATE: October 2023  SUBJECTIVE:   SUBJECTIVE STATEMENT: Last week I didn't get winded playing.  Yesterday was the worst and I played 6 games and I will see tomorrow what happens.  The breathing and then having rashes on my skin with water is affecting the exercises. Feel my hip is just sore.  Eval Statement: I have been able to play pickleball, workout, walking 3-4. Walking, pickleball, gym exercises causing increased lateral hip and upper leg pain. Using Voltaren on hip and hands before activities. Piriformis stretch on Rt hurts the greater trochanter.  Pt has osteoporosis -2.9T score.  Pt states hip hurts lying on it at night and sometimes it hurts when standing after sitting.  Pt is sitting 45-60 min when that happens.  PERTINENT HISTORY: Osteoporosis, some autoimmune symptoms, COPD (mild),  PAIN:  Are you having pain? Yes NPRS scale: 5/10 Pain location: hip Pain orientation: Right  PAIN TYPE: aching, bruised Pain description: intermittent  Aggravating factors: sleeping, sometimes after sitting a while Relieving factors: moving  PRECAUTIONS: None  WEIGHT BEARING RESTRICTIONS: No  FALLS:  Has patient fallen in last 6 months? No  LIVING ENVIRONMENT: Lives with: lives with their spouse Lives in: House/apartment   OCCUPATION: retired - Market researcher,   PLOF:  Independent  PATIENT GOALS: be able to sleep on Rt side and not have hip pain  NEXT MD VISIT: NA  OBJECTIVE:   DIAGNOSTIC FINDINGS:   PATIENT SURVEYS:  FOTO = 34  COGNITION: Overall cognitive status: Within functional limits for tasks assessed     SENSATION: Not tested  EDEMA:    MUSCLE LENGTH: Hamstrings: 20-30 deg knee bend with toe touch Thomas test:  POSTURE: rounded shoulders, decreased lumbar lordosis, increased thoracic kyphosis, flexed trunk , and scoliosis  PALPATION: Tender around greater trochanter on the Rt side; IT band and TFL tenderness  LOWER EXTREMITY ROM:  Passive ROM Right eval Left eval  Hip flexion    Hip extension    Hip abduction    Hip adduction +pain   Hip internal rotation    Hip external rotation 75% +pain   Knee flexion    Knee extension    Ankle dorsiflexion    Ankle plantarflexion    Ankle inversion    Ankle eversion     (Blank rows = not tested) LUMBAR ROM:   Active  A/PROM  eval  Flexion WFL +pain coming up  Extension WFL   Right lateral flexion pain  Left lateral flexion pain  Right rotation WFL   Left rotation WFL    (Blank rows = not tested)  LOWER EXTREMITY MMT:  MMT Right eval Left eval  Hip flexion    Hip extension 4/5 5/5  Hip abduction 4/5 5/5  Hip adduction 5/5 5/5  Hip internal rotation 4/5 5/5  Hip external rotation 4/5 5/5  Knee flexion    Knee extension    Ankle dorsiflexion    Ankle plantarflexion    Ankle inversion    Ankle eversion     (Blank rows = not tested)  LOWER EXTREMITY SPECIAL TESTS:  Hip special tests: Saralyn Pilar (FABER) test: positive  for pain on Rt lateral hip  FUNCTIONAL TESTS:  Single leg stand 10 sec bil - trendelenburg and needs UE 1x for Rt side  GAIT: Distance walked: from gym to room 4 Assistive device utilized: no Level of assistance: Complete Independence Comments: flexed trunk, internal hip rotation Rt>Lt   TODAY'S TREATMENT:                                                                                                                               DATE: 02/22/22           Exercises:  Thoracic rotation - sitting and standing , lumbar rotation standing Review HEP and making sure all exercises correct and not increasing pain  Manual:  Obturator Gluteals with addaday  PATIENT EDUCATION:  Education details: Access Code: [email or web address] Person educated: Patient Education method: Explanation, Demonstration, Tactile cues, Verbal cues, and Handouts Education comprehension: verbalized understanding and returned demonstration  HOME EXERCISE PROGRAM: Access Code: [email or web address] URL: [email or web address] Date: 01/18/2022 Prepared by: Jari Favre  Exercises - Push-Up on Counter  - 1 x daily - 7 x weekly - 2-3 sets - 10 reps - Plank on Counter  - 1 x daily - 7 x weekly - 2-3 sets - 10 reps - Standing Lumbar Extension at [place]  - 1 x daily - 7 x weekly - 3 sets - 10 reps - Standing with Forearms Thoracic Rotation  - 1 x daily - 7 x weekly - 3 sets - 10 reps - Marching Bridge  - 1 x daily - 7 x weekly - 1 sets - 10 reps - Bridge with Hip Abduction and Resistance  - 1 x daily - 7 x weekly - 3 sets - 10 reps  ASSESSMENT:  CLINICAL IMPRESSION: Today;s session focused on going over information on HEP to ensure exercises are pain free. Gave new band since original band did not get tightened correctly and did thoracic rotation in more gentle seated position.  Pt responded well to massage gun on gluteals to help ease hip pain.  Release of obturator in sidelying and achieved some relase. Pain is on bursa and new order will be sent to include ionto as this is expected to help get rid of inflammation.  OBJECTIVE IMPAIRMENTS: decreased endurance, difficulty walking, decreased ROM, decreased strength, postural dysfunction, and pain.   ACTIVITY LIMITATIONS: sitting, sleeping, and exercise  PARTICIPATION LIMITATIONS: community  activity  PERSONAL FACTORS: Age, Time since onset of injury/illness/exacerbation, and 1-2 comorbidities: COPD, arthritis, osteoporosis  are also affecting patient's functional outcome.   REHAB POTENTIAL: Excellent  CLINICAL DECISION MAKING: Stable/uncomplicated  EVALUATION COMPLEXITY: Low   GOALS: Goals reviewed with patient? Yes  SHORT TERM GOALS: Target date: 02/15/22 Ind with initial HEP Baseline: Goal status: IN PROGRESS  LONG TERM GOALS: Target date: 05/10/22  Pt will be independent with advanced HEP to maintain improvements made throughout therapy  Baseline:  Goal status: INITIAL  2.  FOTO is 80 or higher based on predicted outcomes Baseline:  Goal status: INITIAL  3.  Pt will have 5/5 Rt hip abduction and ER for improved stability during pickleball Baseline:  Goal status: INITIAL  4.  Pt will be able to sleep on Rt side for part of the night without pain. Baseline:  Goal status: INITIAL  5.  Pt will have at least 3/4 days that are pain free during typical activities. Baseline:  Goal status: INITIAL   PLAN:  PT FREQUENCY: every other week or monthly  PT DURATION:  6 sessions  PLANNED INTERVENTIONS: Therapeutic exercises, Therapeutic activity, Neuromuscular re-education, Balance training, Gait training, Patient/Family education, Self Care, Joint mobilization, Dry Needling, Electrical stimulation, Cryotherapy, Moist heat, Taping, Traction, Biofeedback, Ionotophoresis '4mg'$ /ml Dexamethasone, Manual therapy, and Re-evaluation  PLAN FOR NEXT SESSION: start with addaday on gluteals; ionto if new order signed and insurance, single leg stability and lumbar multifidi strength, review initial HEP, pivot   [employer], PT 02/22/2022, 1:54 PM

## 2022-02-22 NOTE — Patient Instructions (Signed)

## 2022-02-27 DIAGNOSIS — R21 Rash and other nonspecific skin eruption: Secondary | ICD-10-CM | POA: Diagnosis not present

## 2022-02-27 DIAGNOSIS — J310 Chronic rhinitis: Secondary | ICD-10-CM | POA: Diagnosis not present

## 2022-02-27 DIAGNOSIS — J449 Chronic obstructive pulmonary disease, unspecified: Secondary | ICD-10-CM | POA: Diagnosis not present

## 2022-02-28 ENCOUNTER — Encounter (HOSPITAL_BASED_OUTPATIENT_CLINIC_OR_DEPARTMENT_OTHER): Payer: Self-pay | Admitting: Pulmonary Disease

## 2022-03-01 DIAGNOSIS — J449 Chronic obstructive pulmonary disease, unspecified: Secondary | ICD-10-CM | POA: Diagnosis not present

## 2022-03-01 DIAGNOSIS — R21 Rash and other nonspecific skin eruption: Secondary | ICD-10-CM | POA: Diagnosis not present

## 2022-03-01 DIAGNOSIS — J310 Chronic rhinitis: Secondary | ICD-10-CM | POA: Diagnosis not present

## 2022-03-01 DIAGNOSIS — L309 Dermatitis, unspecified: Secondary | ICD-10-CM | POA: Diagnosis not present

## 2022-03-01 DIAGNOSIS — L282 Other prurigo: Secondary | ICD-10-CM | POA: Diagnosis not present

## 2022-03-01 DIAGNOSIS — L2089 Other atopic dermatitis: Secondary | ICD-10-CM | POA: Diagnosis not present

## 2022-03-01 DIAGNOSIS — L820 Inflamed seborrheic keratosis: Secondary | ICD-10-CM | POA: Diagnosis not present

## 2022-03-03 DIAGNOSIS — J449 Chronic obstructive pulmonary disease, unspecified: Secondary | ICD-10-CM | POA: Diagnosis not present

## 2022-03-03 DIAGNOSIS — J310 Chronic rhinitis: Secondary | ICD-10-CM | POA: Diagnosis not present

## 2022-03-03 DIAGNOSIS — R21 Rash and other nonspecific skin eruption: Secondary | ICD-10-CM | POA: Diagnosis not present

## 2022-03-03 DIAGNOSIS — L2089 Other atopic dermatitis: Secondary | ICD-10-CM | POA: Diagnosis not present

## 2022-03-08 ENCOUNTER — Encounter: Payer: Self-pay | Admitting: Physical Therapy

## 2022-03-08 ENCOUNTER — Ambulatory Visit: Payer: Medicare PPO | Attending: Internal Medicine | Admitting: Physical Therapy

## 2022-03-08 DIAGNOSIS — M62838 Other muscle spasm: Secondary | ICD-10-CM | POA: Insufficient documentation

## 2022-03-08 DIAGNOSIS — R262 Difficulty in walking, not elsewhere classified: Secondary | ICD-10-CM | POA: Insufficient documentation

## 2022-03-08 DIAGNOSIS — M25551 Pain in right hip: Secondary | ICD-10-CM | POA: Insufficient documentation

## 2022-03-08 NOTE — Therapy (Signed)
OUTPATIENT PHYSICAL THERAPY LOWER EXTREMITY TREATMENT   Patient Name: Tina Lucas MRN: [id number] DOB:1950-09-17, 72 y.o., female Today's Date: 03/08/2022  END OF SESSION:  PT End of Session - 03/08/22 0937     Visit Number 3    Date for PT Re-Evaluation 05/10/22    Authorization Type [employer]-  6 visits until 05/10/22    PT Start Time 0934    PT Stop Time 1014    PT Time Calculation (min) 40 min    Activity Tolerance Patient tolerated treatment well    Behavior During Therapy [facility] for tasks assessed/performed               Past Medical History:  Diagnosis Date   Allergic rhinitis    Allergy    Asthma    as a child   Cataract    surgical correction, 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   COPD (chronic obstructive pulmonary disease) ([place])    History of asthma 1956-2000   Hypothyroid 2013   Osteopenia    Primary HSV infection of mouth 2008   Pulmonary nodule    Sunlight-induced angio-edema-urticaria 07/11/2011   unknown trigger, neg autoimmune workup   Past Surgical History:  Procedure Laterality Date   51 HOUR [place] STUDY N/A 08/13/2019   Procedure: 24 HOUR PH STUDY- impedence;  Surgeon: Irving Copas., MD;  Location: [facility];  Service: Gastroenterology;  Laterality: N/A;   COLONOSCOPY     ESOPHAGEAL MANOMETRY N/A 08/13/2019   Procedure: ESOPHAGEAL MANOMETRY (EM);  Surgeon: Irving Copas., MD;  Location: [facility];  Service: Gastroenterology;  Laterality: N/A;   [place] IMPEDANCE STUDY  08/13/2019   Procedure: [place] IMPEDANCE STUDY;  Surgeon: Irving Copas., MD;  Location: [facility];  Service: Gastroenterology;;   polyp on vocal chord      TONSILLECTOMY  1958   Patient Active Problem List   Diagnosis Date Noted   Lateral pain of right hip 11/29/2021   Hoarseness 09/23/2021   Allergic angioedema 09/06/2021   Acute pain of right shoulder 05/06/2021   Anxiety 04/06/2021   Dextroscoliosis 04/06/2021   Senile purpura ([place]) 03/31/2020    Hiatal hernia 06/15/2019   Functional heartburn 06/15/2019   Abnormal findings on esophagogastroduodenoscopy (EGD) 04/26/2018   Atypical chest pain 02/26/2018   Asthma-COPD overlap syndrome 01/31/2018   Osteopenia 12/10/2014   Allergic urticaria 12/10/2014   Hypothyroid    Primary HSV infection of mouth     PCP: Binnie Rail, MD  REFERRING PROVIDER: Binnie Rail, MD  REFERRING DIAG: [phone] (ICD-10-CM) - Lateral pain of right hip  THERAPY DIAG:  Pain in right hip  Other muscle spasm  Difficulty in walking, not elsewhere classified  Rationale for Evaluation and Treatment: Rehabilitation  ONSET DATE: October 2023  SUBJECTIVE:   SUBJECTIVE STATEMENT: I haven't been using Voltaren since I wasn't sure if we would do ionto.  I have an ointment for the water allergie.  There are a lot of skin issues right now  Eval Statement: I have been able to play pickleball, workout, walking 3-4. Walking, pickleball, gym exercises causing increased lateral hip and upper leg pain. Using Voltaren on hip and hands before activities. Piriformis stretch on Rt hurts the greater trochanter.  Pt has osteoporosis -2.9T score.  Pt states hip hurts lying on it at night and sometimes it hurts when standing after sitting.  Pt is sitting 45-60 min when that happens.  PERTINENT HISTORY: Osteoporosis, some autoimmune symptoms, COPD (mild),  PAIN:  Are  you having pain? Yes NPRS scale: 5/10 Pain location: hip Pain orientation: Right  PAIN TYPE: aching, bruised Pain description: intermittent  Aggravating factors: sleeping, sometimes after sitting a while Relieving factors: moving  PRECAUTIONS: None  WEIGHT BEARING RESTRICTIONS: No  FALLS:  Has patient fallen in last 6 months? No  LIVING ENVIRONMENT: Lives with: lives with their spouse Lives in: House/apartment   OCCUPATION: retired - Market researcher,   PLOF: Independent  PATIENT GOALS: be able to sleep on Rt side and not have hip  pain  NEXT MD VISIT: NA  OBJECTIVE:   DIAGNOSTIC FINDINGS:   PATIENT SURVEYS:  FOTO = 17  COGNITION: Overall cognitive status: Within functional limits for tasks assessed     SENSATION: Not tested  EDEMA:    MUSCLE LENGTH: Hamstrings: 20-30 deg knee bend with toe touch Thomas test:  POSTURE: rounded shoulders, decreased lumbar lordosis, increased thoracic kyphosis, flexed trunk , and scoliosis  PALPATION: Tender around greater trochanter on the Rt side; IT band and TFL tenderness  LOWER EXTREMITY ROM:  Passive ROM Right eval Left eval  Hip flexion    Hip extension    Hip abduction    Hip adduction +pain   Hip internal rotation    Hip external rotation 75% +pain   Knee flexion    Knee extension    Ankle dorsiflexion    Ankle plantarflexion    Ankle inversion    Ankle eversion     (Blank rows = not tested) LUMBAR ROM:   Active  A/PROM  eval  Flexion WFL +pain coming up  Extension WFL   Right lateral flexion pain  Left lateral flexion pain  Right rotation WFL   Left rotation WFL    (Blank rows = not tested)  LOWER EXTREMITY MMT:  MMT Right eval Left eval  Hip flexion    Hip extension 4/5 5/5  Hip abduction 4/5 5/5  Hip adduction 5/5 5/5  Hip internal rotation 4/5 5/5  Hip external rotation 4/5 5/5  Knee flexion    Knee extension    Ankle dorsiflexion    Ankle plantarflexion    Ankle inversion    Ankle eversion     (Blank rows = not tested)  LOWER EXTREMITY SPECIAL TESTS:  Hip special tests: Saralyn Pilar (FABER) test: positive  for pain on Rt lateral hip  FUNCTIONAL TESTS:  Single leg stand 10 sec bil - trendelenburg and needs UE 1x for Rt side  GAIT: Distance walked: from gym to room 4 Assistive device utilized: no Level of assistance: Complete Independence Comments: flexed trunk, internal hip rotation Rt>Lt   TODAY'S TREATMENT:                                                                                                                               DATE: 03/08/22           Exercises:  Review HEP briefly - exercise at wall to make sure not  overdoing as pt feeling more pain in pickleball  Manual:  Gluteals, Itband, TFL, vastus lateralis with addaday and fascial release  Ionto: Rt greater trochanteric bursa - 4m 4-6 hour patch  PATIENT EDUCATION:  Education details: Access Code: [email or web address] educated: Patient Education method: Explanation, Demonstration, Tactile cues, Verbal cues, and Handouts Education comprehension: verbalized understanding and returned demonstration  HOME EXERCISE PROGRAM: Access Code: [email or web address]: [email or web address] Date: 01/18/2022 Prepared by: JJari Favre Exercises - Push-Up on Counter  - 1 x daily - 7 x weekly - 2-3 sets - 10 reps - Plank on Counter  - 1 x daily - 7 x weekly - 2-3 sets - 10 reps - Standing Lumbar Extension at [place] - 1 x daily - 7 x weekly - 3 sets - 10 reps - Standing with Forearms Thoracic Rotation  - 1 x daily - 7 x weekly - 3 sets - 10 reps - Marching Bridge  - 1 x daily - 7 x weekly - 1 sets - 10 reps - Bridge with Hip Abduction and Resistance  - 1 x daily - 7 x weekly - 3 sets - 10 reps  ASSESSMENT:  CLINICAL IMPRESSION: Pt felt good initially with ionto but will follow up for long term results.  Pt responded well to STM.  Pt is recommended to continue skilled therapy to address pain management so that she can begin more aggressive strengthening of hip without increased pain.  OBJECTIVE IMPAIRMENTS: decreased endurance, difficulty walking, decreased ROM, decreased strength, postural dysfunction, and pain.   ACTIVITY LIMITATIONS: sitting, sleeping, and exercise  PARTICIPATION LIMITATIONS: community activity  PERSONAL FACTORS: Age, Time since onset of injury/illness/exacerbation, and 1-2 comorbidities: COPD, arthritis, osteoporosis  are also affecting patient's functional outcome.   REHAB POTENTIAL:  Excellent  CLINICAL DECISION MAKING: Stable/uncomplicated  EVALUATION COMPLEXITY: Low   GOALS: Goals reviewed with patient? Yes  SHORT TERM GOALS: Target date: 02/15/22 Ind with initial HEP Baseline: Goal status: IN PROGRESS  LONG TERM GOALS: Target date: 05/10/22  Pt will be independent with advanced HEP to maintain improvements made throughout therapy  Baseline:  Goal status: INITIAL  2.  FOTO is 80 or higher based on predicted outcomes Baseline:  Goal status: INITIAL  3.  Pt will have 5/5 Rt hip abduction and ER for improved stability during pickleball Baseline:  Goal status: INITIAL  4.  Pt will be able to sleep on Rt side for part of the night without pain. Baseline:  Goal status: INITIAL  5.  Pt will have at least 3/4 days that are pain free during typical activities. Baseline:  Goal status: INITIAL   PLAN:  PT FREQUENCY: every other week or monthly  PT DURATION:  6 sessions  PLANNED INTERVENTIONS: Therapeutic exercises, Therapeutic activity, Neuromuscular re-education, Balance training, Gait training, Patient/Family education, Self Care, Joint mobilization, Dry Needling, Electrical stimulation, Cryotherapy, Moist heat, Taping, Traction, Biofeedback, Ionotophoresis 460mml Dexamethasone, Manual therapy, and Re-evaluation  PLAN FOR NEXT SESSION: info on addaday and addaday on gluteals; f/u on ionto add as able single leg stability and lumbar multifidi strength, review HEP, pivot   JaCendant CorporationPT 03/08/2022, 9:38 AM

## 2022-03-14 NOTE — Telephone Encounter (Signed)
I rescheduled patient to 3/13 arrive 10:00 for 10:30 appointment at [facility].  No prep.  Will route to triage so pt can be made aware thru [place].

## 2022-03-22 ENCOUNTER — Ambulatory Visit: Payer: Medicare PPO | Admitting: Physical Therapy

## 2022-03-22 ENCOUNTER — Telehealth: Payer: Self-pay

## 2022-03-22 DIAGNOSIS — M62838 Other muscle spasm: Secondary | ICD-10-CM | POA: Diagnosis not present

## 2022-03-22 DIAGNOSIS — M25551 Pain in right hip: Secondary | ICD-10-CM | POA: Diagnosis not present

## 2022-03-22 DIAGNOSIS — R262 Difficulty in walking, not elsewhere classified: Secondary | ICD-10-CM | POA: Diagnosis not present

## 2022-03-22 NOTE — Therapy (Signed)
OUTPATIENT PHYSICAL THERAPY LOWER EXTREMITY TREATMENT   Patient Name: Tina Lucas MRN: [id number] DOB:[birth year], 72 y.o., female Today's Date: 03/22/2022  END OF SESSION:  PT End of Session - 03/22/22 1053     Visit Number 4    Date for PT Re-Evaluation 05/10/22    Authorization Type [employer]-  6 visits until 05/10/22    PT Start Time 1017    PT Stop Time 1051    PT Time Calculation (min) 34 min    Activity Tolerance Patient tolerated treatment well    Behavior During Therapy [facility] for tasks assessed/performed                Past Medical History:  Diagnosis Date   Allergic rhinitis    Allergy    Asthma    as a child   Cataract    surgical correction, 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   COPD (chronic obstructive pulmonary disease) ([place])    History of asthma 1956-2000   Hypothyroid 2013   Osteopenia    Primary HSV infection of mouth 2008   Pulmonary nodule    Sunlight-induced angio-edema-urticaria 07/11/2011   unknown trigger, neg autoimmune workup   Past Surgical History:  Procedure Laterality Date   34 HOUR [place] STUDY N/A 08/13/2019   Procedure: 24 HOUR PH STUDY- impedence;  Surgeon: Irving Copas., MD;  [facility];  Service: Gastroenterology;  Laterality: N/A;   COLONOSCOPY     ESOPHAGEAL MANOMETRY N/A 08/13/2019   Procedure: ESOPHAGEAL MANOMETRY (EM);  Surgeon: Irving Copas., MD;  [facility];  Service: Gastroenterology;  Laterality: N/A;   [place] IMPEDANCE STUDY  08/13/2019   Procedure: [place] IMPEDANCE STUDY;  Surgeon: Irving Copas., MD;  Location: [facility];  Service: Gastroenterology;;   polyp on vocal chord      TONSILLECTOMY  1958   Patient Active Problem List   Diagnosis Date Noted   Lateral pain of right hip 11/29/2021   Hoarseness 09/23/2021   Allergic angioedema 09/06/2021   Acute pain of right shoulder 05/06/2021   Anxiety 04/06/2021   Dextroscoliosis 04/06/2021   Senile purpura ([place]) 03/31/2020    Hiatal hernia 06/15/2019   Functional heartburn 06/15/2019   Abnormal findings on esophagogastroduodenoscopy (EGD) 04/26/2018   Atypical chest pain 02/26/2018   Asthma-COPD overlap syndrome 01/31/2018   Osteopenia 12/10/2014   Allergic urticaria 12/10/2014   Hypothyroid    Primary HSV infection of mouth     PCP: Binnie Rail, MD  REFERRING PROVIDER: Binnie Rail, MD  REFERRING DIAG: [phone] (ICD-10-CM) - Lateral pain of right hip  THERAPY DIAG:  Pain in right hip  Other muscle spasm  Rationale for Evaluation and Treatment: Rehabilitation  ONSET DATE: October 2023  SUBJECTIVE:   SUBJECTIVE STATEMENT: I had the ionto and it did okay that day, but then after that there was pain again.  Eval Statement: I have been able to play pickleball, workout, walking 3-4. Walking, pickleball, gym exercises causing increased lateral hip and upper leg pain. Using Voltaren on hip and hands before activities. Piriformis stretch on Rt hurts the greater trochanter.  Pt has osteoporosis -2.9T score.  Pt states hip hurts lying on it at night and sometimes it hurts when standing after sitting.  Pt is sitting 45-60 min when that happens.  PERTINENT HISTORY: Osteoporosis, some autoimmune symptoms, COPD (mild),  PAIN:  Are you having pain? Yes NPRS scale: 5/10 Pain location: hip Pain orientation: Right  PAIN TYPE: aching, bruised Pain description: intermittent  Aggravating factors: sleeping, sometimes after sitting a while Relieving factors: moving  PRECAUTIONS: None  WEIGHT BEARING RESTRICTIONS: No  FALLS:  Has patient fallen in last 6 months? No  LIVING ENVIRONMENT: Lives with: lives with their spouse Lives in: House/apartment   OCCUPATION: retired - Market researcher,   PLOF: Independent  PATIENT GOALS: be able to sleep on Rt side and not have hip pain  NEXT MD VISIT: NA  OBJECTIVE:   DIAGNOSTIC FINDINGS:   PATIENT SURVEYS:  FOTO = 51  COGNITION: Overall  cognitive status: Within functional limits for tasks assessed     SENSATION: Not tested  EDEMA:    MUSCLE LENGTH: Hamstrings: 20-30 deg knee bend with toe touch Thomas test:  POSTURE: rounded shoulders, decreased lumbar lordosis, increased thoracic kyphosis, flexed trunk , and scoliosis  PALPATION: Tender around greater trochanter on the Rt side; IT band and TFL tenderness  LOWER EXTREMITY ROM:  Passive ROM Right eval Left eval  Hip flexion    Hip extension    Hip abduction    Hip adduction +pain   Hip internal rotation    Hip external rotation 75% +pain   Knee flexion    Knee extension    Ankle dorsiflexion    Ankle plantarflexion    Ankle inversion    Ankle eversion     (Blank rows = not tested) LUMBAR ROM:   Active  A/PROM  eval  Flexion WFL +pain coming up  Extension WFL   Right lateral flexion pain  Left lateral flexion pain  Right rotation WFL   Left rotation WFL    (Blank rows = not tested)  LOWER EXTREMITY MMT:  MMT Right eval Left eval  Hip flexion    Hip extension 4/5 5/5  Hip abduction 4/5 5/5  Hip adduction 5/5 5/5  Hip internal rotation 4/5 5/5  Hip external rotation 4/5 5/5  Knee flexion    Knee extension    Ankle dorsiflexion    Ankle plantarflexion    Ankle inversion    Ankle eversion     (Blank rows = not tested)  LOWER EXTREMITY SPECIAL TESTS:  Hip special tests: Saralyn Pilar (FABER) test: positive  for pain on Rt lateral hip  FUNCTIONAL TESTS:  Single leg stand 10 sec bil - trendelenburg and needs UE 1x for Rt side  GAIT: Distance walked: from gym to room 4 Assistive device utilized: no Level of assistance: Complete Independence Comments: flexed trunk, internal hip rotation Rt>Lt   TODAY'S TREATMENT:                                                                                                                              DATE: 03/22/22             Manual:  Gluteals, Itband, TFL, vastus lateralis with addaday and fascial  release  Ionto: Rt greater trochanteric bursa - 81m 4-6 hour patch  PATIENT EDUCATION:  Education details: Access Code: [email or web address]  educated: Patient Education method: Explanation, Demonstration, Tactile cues, Verbal cues, and Handouts Education comprehension: verbalized understanding and returned demonstration  HOME EXERCISE PROGRAM: Access Code: [email or web address] URL: [email or web address] Date: 01/18/2022 Prepared by: Jari Favre  Exercises - Push-Up on Counter  - 1 x daily - 7 x weekly - 2-3 sets - 10 reps - Plank on Counter  - 1 x daily - 7 x weekly - 2-3 sets - 10 reps - Standing Lumbar Extension at [place]  - 1 x daily - 7 x weekly - 3 sets - 10 reps - Standing with Forearms Thoracic Rotation  - 1 x daily - 7 x weekly - 3 sets - 10 reps - Marching Bridge  - 1 x daily - 7 x weekly - 1 sets - 10 reps - Bridge with Hip Abduction and Resistance  - 1 x daily - 7 x weekly - 3 sets - 10 reps  ASSESSMENT:  CLINICAL IMPRESSION: Pt had much less tenderness with manual and had good response with ionto patch last visit.  Continues to have good response to manual techniques and ionto and will benefit from skilled PT to continue with this for longer lasting improvements expected.    OBJECTIVE IMPAIRMENTS: decreased endurance, difficulty walking, decreased ROM, decreased strength, postural dysfunction, and pain.   ACTIVITY LIMITATIONS: sitting, sleeping, and exercise  PARTICIPATION LIMITATIONS: community activity  PERSONAL FACTORS: Age, Time since onset of injury/illness/exacerbation, and 1-2 comorbidities: COPD, arthritis, osteoporosis  are also affecting patient's functional outcome.   REHAB POTENTIAL: Excellent  CLINICAL DECISION MAKING: Stable/uncomplicated  EVALUATION COMPLEXITY: Low   GOALS: Goals reviewed with patient? Yes  SHORT TERM GOALS: Target date: 02/15/22 Ind with initial HEP Baseline: Goal status: MET  LONG TERM GOALS: Target date:  05/10/22  Pt will be independent with advanced HEP to maintain improvements made throughout therapy  Baseline:  Goal status: IN PROGRESS  2.  FOTO is 80 or higher based on predicted outcomes Baseline:  Goal status: IN PROGRESS  3.  Pt will have 5/5 Rt hip abduction and ER for improved stability during pickleball Baseline:  Goal status: IN PROGRESS  4.  Pt will be able to sleep on Rt side for part of the night without pain. Baseline:  Goal status: IN PROGRESS  5.  Pt will have at least 3/4 days that are pain free during typical activities. Baseline:  Goal status: IN PROGRESS   PLAN:  PT FREQUENCY: every other week or monthly  PT DURATION:  6 sessions  PLANNED INTERVENTIONS: Therapeutic exercises, Therapeutic activity, Neuromuscular re-education, Balance training, Gait training, Patient/Family education, Self Care, Joint mobilization, Dry Needling, Electrical stimulation, Cryotherapy, Moist heat, Taping, Traction, Biofeedback, Ionotophoresis '4mg'$ /ml Dexamethasone, Manual therapy, and Re-evaluation  PLAN FOR NEXT SESSION: ionto and manual soft tissue lenghtening, gluteal strength progression as tolerated   Camillo Flaming Jasmia Angst, PT 03/22/2022, 10:57 AM

## 2022-03-22 NOTE — Telephone Encounter (Signed)
[place] to schedule their annual wellness visit. Patient declined to schedule AWV at this time.  DO NOT CALL BACK, she does not think it necessary and does not want it completed.  Norton Blizzard, [place] (AAMA)  [facility] [facility] [phone]

## 2022-03-24 ENCOUNTER — Encounter: Payer: Self-pay | Admitting: Physical Therapy

## 2022-03-24 ENCOUNTER — Telehealth: Payer: Self-pay | Admitting: Podiatry

## 2022-03-24 NOTE — Telephone Encounter (Signed)
Left message that Dr Elisha Ponder is not able to do the toenail removal and she would recommend pt to see Dr Sherryle Lis and he would take good care of her. I asked her to call to get scheduled with Dr Sherryle Lis.

## 2022-03-24 NOTE — Telephone Encounter (Signed)
Pt called states you told her she would know about when her toenails need to be removed and she said it is time. Currently right is worse. She is asking if you would be able to do this procedure on 3.6 at her appt. She would prefer that you did it. I did tell her you may send her to another provider but she is really wanting you to do it. Please advise.

## 2022-03-29 ENCOUNTER — Ambulatory Visit: Payer: Medicare PPO | Admitting: Podiatry

## 2022-04-03 ENCOUNTER — Ambulatory Visit: Payer: Medicare PPO | Attending: Internal Medicine | Admitting: Physical Therapy

## 2022-04-03 DIAGNOSIS — R262 Difficulty in walking, not elsewhere classified: Secondary | ICD-10-CM | POA: Diagnosis not present

## 2022-04-03 DIAGNOSIS — M62838 Other muscle spasm: Secondary | ICD-10-CM | POA: Diagnosis not present

## 2022-04-03 DIAGNOSIS — M25551 Pain in right hip: Secondary | ICD-10-CM | POA: Diagnosis not present

## 2022-04-03 DIAGNOSIS — L309 Dermatitis, unspecified: Secondary | ICD-10-CM | POA: Diagnosis not present

## 2022-04-03 DIAGNOSIS — L2089 Other atopic dermatitis: Secondary | ICD-10-CM | POA: Diagnosis not present

## 2022-04-03 NOTE — Therapy (Signed)
OUTPATIENT PHYSICAL THERAPY LOWER EXTREMITY TREATMENT   Patient Name: Tina Lucas MRN: [id number] DOB:09/29/1950, 72 y.o., female Today's Date: 04/03/2022  END OF SESSION:  PT End of Session - 04/03/22 0933     Visit Number 5    Date for PT Re-Evaluation 05/10/22    Authorization Type [employer]-  6 visits until 05/10/22    PT Start Time 0932    PT Stop Time 1011    PT Time Calculation (min) 39 min    Activity Tolerance Patient tolerated treatment well    Behavior During Therapy [facility] for tasks assessed/performed                 Past Medical History:  Diagnosis Date   Allergic rhinitis    Allergy    Asthma    as a child   Cataract    surgical correction, 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   COPD (chronic obstructive pulmonary disease) ([place])    History of asthma 1956-2000   Hypothyroid 2013   Osteopenia    Primary HSV infection of mouth 2008   Pulmonary nodule    Sunlight-induced angio-edema-urticaria 07/11/2011   unknown trigger, neg autoimmune workup   Past Surgical History:  Procedure Laterality Date   42 HOUR [place] STUDY N/A 08/13/2019   Procedure: 24 HOUR PH STUDY- impedence;  Surgeon: Irving Copas., MD;  Location: [facility];  Service: Gastroenterology;  Laterality: N/A;   COLONOSCOPY     ESOPHAGEAL MANOMETRY N/A 08/13/2019   Procedure: ESOPHAGEAL MANOMETRY (EM);  Surgeon: Irving Copas., MD;  Location[facility];  Service: Gastroenterology;  Laterality: N/A;   [place] IMPEDANCE STUDY  08/13/2019   Procedure: [place] IMPEDANCE STUDY;  Surgeon: Irving Copas., MD;  Location: [facility];  Service: Gastroenterology;;   polyp on vocal chord      TONSILLECTOMY  1958   Patient Active Problem List   Diagnosis Date Noted   Lateral pain of right hip 11/29/2021   Hoarseness 09/23/2021   Allergic angioedema 09/06/2021   Acute pain of right shoulder 05/06/2021   Anxiety 04/06/2021   Dextroscoliosis 04/06/2021   Senile purpura ([place])  03/31/2020   Hiatal hernia 06/15/2019   Functional heartburn 06/15/2019   Abnormal findings on esophagogastroduodenoscopy (EGD) 04/26/2018   Atypical chest pain 02/26/2018   Asthma-COPD overlap syndrome 01/31/2018   Osteopenia 12/10/2014   Allergic urticaria 12/10/2014   Hypothyroid    Primary HSV infection of mouth     PCP: Binnie Rail, MD  REFERRING PROVIDER: Binnie Rail, MD  REFERRING DIAG: [phone] (ICD-10-CM) - Lateral pain of right hip  THERAPY DIAG:  Pain in right hip  Other muscle spasm  Difficulty in walking, not elsewhere classified  Rationale for Evaluation and Treatment: Rehabilitation  ONSET DATE: October 2023  SUBJECTIVE:   SUBJECTIVE STATEMENT: I am more sore today.  Not using voltran   Eval Statement: I have been able to play pickleball, workout, walking 3-4. Walking, pickleball, gym exercises causing increased lateral hip and upper leg pain. Using Voltaren on hip and hands before activities. Piriformis stretch on Rt hurts the greater trochanter.  Pt has osteoporosis -2.9T score.  Pt states hip hurts lying on it at night and sometimes it hurts when standing after sitting.  Pt is sitting 45-60 min when that happens.  PERTINENT HISTORY: Osteoporosis, some autoimmune symptoms, COPD (mild),  PAIN:  Are you having pain? Yes NPRS scale: 5/10 Pain location: hip Pain orientation: Right  PAIN TYPE: aching, bruised Pain description: intermittent  Aggravating factors: sleeping, sometimes after sitting a while Relieving factors: moving  PRECAUTIONS: None  WEIGHT BEARING RESTRICTIONS: No  FALLS:  Has patient fallen in last 6 months? No  LIVING ENVIRONMENT: Lives with: lives with their spouse Lives in: House/apartment   OCCUPATION: retired - Market researcher,   PLOF: Independent  PATIENT GOALS: be able to sleep on Rt side and not have hip pain  NEXT MD VISIT: NA  OBJECTIVE:   DIAGNOSTIC FINDINGS:   PATIENT SURVEYS:  FOTO =  63  COGNITION: Overall cognitive status: Within functional limits for tasks assessed     SENSATION: Not tested  EDEMA:    MUSCLE LENGTH: Hamstrings: 20-30 deg knee bend with toe touch Thomas test:  POSTURE: rounded shoulders, decreased lumbar lordosis, increased thoracic kyphosis, flexed trunk , and scoliosis  PALPATION: Tender around greater trochanter on the Rt side; IT band and TFL tenderness  LOWER EXTREMITY ROM:  Passive ROM Right eval Left eval  Hip flexion    Hip extension    Hip abduction    Hip adduction +pain   Hip internal rotation    Hip external rotation 75% +pain   Knee flexion    Knee extension    Ankle dorsiflexion    Ankle plantarflexion    Ankle inversion    Ankle eversion     (Blank rows = not tested) LUMBAR ROM:   Active  A/PROM  eval  Flexion WFL +pain coming up  Extension WFL   Right lateral flexion pain  Left lateral flexion pain  Right rotation WFL   Left rotation WFL    (Blank rows = not tested)  LOWER EXTREMITY MMT:  MMT Right eval Left eval  Hip flexion    Hip extension 4/5 5/5  Hip abduction 4/5 5/5  Hip adduction 5/5 5/5  Hip internal rotation 4/5 5/5  Hip external rotation 4/5 5/5  Knee flexion    Knee extension    Ankle dorsiflexion    Ankle plantarflexion    Ankle inversion    Ankle eversion     (Blank rows = not tested)  LOWER EXTREMITY SPECIAL TESTS:  Hip special tests: Saralyn Pilar (FABER) test: positive  for pain on Rt lateral hip  FUNCTIONAL TESTS:  Single leg stand 10 sec bil - trendelenburg and needs UE 1x for Rt side  GAIT: Distance walked: from gym to room 4 Assistive device utilized: no Level of assistance: Complete Independence Comments: flexed trunk, internal hip rotation Rt>Lt   TODAY'S TREATMENT:                                                                                                                              DATE: 04/03/22             Manual:  Gluteals, Itband, TFL, vastus  lateralis with addaday and fascial release  Ionto: Rt greater trochanteric bursa - 30m 4-6 hour patch #3  Exercises: Glute med step ups 4" - holding  5 lb - 10 reps  PATIENT EDUCATION:  Education details: Access Code: [phone] Person educated: Patient Education method: Explanation, Demonstration, Tactile cues, Verbal cues, and Handouts Education comprehension: verbalized understanding and returned demonstration  HOME EXERCISE PROGRAM: Access Code: [email or web address] URL: [email or web address] Date: 01/18/2022 Prepared by: Jari Favre  Exercises - Push-Up on Counter  - 1 x daily - 7 x weekly - 2-3 sets - 10 reps - Plank on Counter  - 1 x daily - 7 x weekly - 2-3 sets - 10 reps - Standing Lumbar Extension at [place]  - 1 x daily - 7 x weekly - 3 sets - 10 reps - Standing with Forearms Thoracic Rotation  - 1 x daily - 7 x weekly - 3 sets - 10 reps - Marching Bridge  - 1 x daily - 7 x weekly - 1 sets - 10 reps - Bridge with Hip Abduction and Resistance  - 1 x daily - 7 x weekly - 3 sets - 10 reps  ASSESSMENT:  CLINICAL IMPRESSION: Pt had muscle tension in glute med and tenderness with manual.  Had good response with ionto patch last visit and again this time feeling better at the end of today's session.  Able to add exercises for hip strength today.  Continues to have good response to manual techniques and ionto #3 and will benefit from skilled PT to continue with this for longer lasting improvements expected.    OBJECTIVE IMPAIRMENTS: decreased endurance, difficulty walking, decreased ROM, decreased strength, postural dysfunction, and pain.   ACTIVITY LIMITATIONS: sitting, sleeping, and exercise  PARTICIPATION LIMITATIONS: community activity  PERSONAL FACTORS: Age, Time since onset of injury/illness/exacerbation, and 1-2 comorbidities: COPD, arthritis, osteoporosis  are also affecting patient's functional outcome.   REHAB POTENTIAL: Excellent  CLINICAL  DECISION MAKING: Stable/uncomplicated  EVALUATION COMPLEXITY: Low   GOALS: Goals reviewed with patient? Yes  SHORT TERM GOALS: Target date: 02/15/22 Ind with initial HEP Baseline: Goal status: MET  LONG TERM GOALS: Target date: 05/10/22 Updated 04/03/22  Pt will be independent with advanced HEP to maintain improvements made throughout therapy  Baseline:  Goal status: IN PROGRESS  2.  FOTO is 80 or higher based on predicted outcomes Baseline:  Goal status: IN PROGRESS  3.  Pt will have 5/5 Rt hip abduction and ER for improved stability during pickleball Baseline:  Goal status: IN PROGRESS  4.  Pt will be able to sleep on Rt side for part of the night without pain. Baseline:  Goal status: IN PROGRESS  5.  Pt will have at least 3/4 days that are pain free during typical activities. Baseline:  Goal status: IN PROGRESS   PLAN:  PT FREQUENCY: every other week or monthly  PT DURATION:  6 sessions  PLANNED INTERVENTIONS: Therapeutic exercises, Therapeutic activity, Neuromuscular re-education, Balance training, Gait training, Patient/Family education, Self Care, Joint mobilization, Dry Needling, Electrical stimulation, Cryotherapy, Moist heat, Taping, Traction, Biofeedback, Ionotophoresis '4mg'$ /ml Dexamethasone, Manual therapy, and Re-evaluation  PLAN FOR NEXT SESSION: ionto, glute step ups and manual soft tissue lenghtening, gluteal strength progression as tolerated   Janey Genta L Elester Apodaca, PT 04/03/2022, 10:14 AM

## 2022-04-05 ENCOUNTER — Encounter (HOSPITAL_COMMUNITY): Payer: Self-pay

## 2022-04-05 ENCOUNTER — Ambulatory Visit (HOSPITAL_COMMUNITY)
Admission: RE | Admit: 2022-04-05 | Discharge: 2022-04-05 | Disposition: A | Discharge status: Home / Self Care | Payer: Medicare PPO | Source: Ambulatory Visit | Attending: Pulmonary Disease | Admitting: Pulmonary Disease

## 2022-04-05 DIAGNOSIS — R918 Other nonspecific abnormal finding of lung field: Secondary | ICD-10-CM

## 2022-04-05 DIAGNOSIS — J449 Chronic obstructive pulmonary disease, unspecified: Secondary | ICD-10-CM | POA: Diagnosis not present

## 2022-04-05 MED ORDER — IOHEXOL 300 MG/ML  SOLN
75.0000 mL | Freq: Once | INTRAMUSCULAR | Status: AC | PRN
  Administered 2022-04-05: 75 mL via INTRAVENOUS

## 2022-04-10 ENCOUNTER — Ambulatory Visit: Payer: Medicare PPO | Admitting: Physical Therapy

## 2022-04-10 DIAGNOSIS — M62838 Other muscle spasm: Secondary | ICD-10-CM | POA: Diagnosis not present

## 2022-04-10 DIAGNOSIS — R262 Difficulty in walking, not elsewhere classified: Secondary | ICD-10-CM

## 2022-04-10 DIAGNOSIS — M25551 Pain in right hip: Secondary | ICD-10-CM

## 2022-04-10 NOTE — Therapy (Signed)
OUTPATIENT PHYSICAL THERAPY LOWER EXTREMITY TREATMENT   Patient Name: Tina Lucas MRN: [id number] DOB:[birth year], 72 y.o., female Today's Date: 04/10/2022  END OF SESSION:  PT End of Session - 04/10/22 1231     Visit Number 6    Date for PT Re-Evaluation 06/05/22    Authorization Type [employer]-  6 visits until 05/10/22    PT Start Time 1231    PT Stop Time 1308    PT Time Calculation (min) 37 min    Activity Tolerance Patient tolerated treatment well    Behavior During Therapy [facility] for tasks assessed/performed                  Past Medical History:  Diagnosis Date   Allergic rhinitis    Allergy    Asthma    as a child   Cataract    surgical correction, 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   COPD (chronic obstructive pulmonary disease) ([place])    History of asthma 1956-2000   Hypothyroid 2013   Osteopenia    Primary HSV infection of mouth 2008   Pulmonary nodule    Sunlight-induced angio-edema-urticaria 07/11/2011   unknown trigger, neg autoimmune workup   Past Surgical History:  Procedure Laterality Date   30 HOUR [place] STUDY N/A 08/13/2019   Procedure: 24 HOUR PH STUDY- impedence;  Surgeon: Irving Copas., MD;  [facility];  Service: Gastroenterology;  Laterality: N/A;   COLONOSCOPY     ESOPHAGEAL MANOMETRY N/A 08/13/2019   Procedure: ESOPHAGEAL MANOMETRY (EM);  Surgeon: Irving Copas., MD;  [facility];  Service: Gastroenterology;  Laterality: N/A;   [place] IMPEDANCE STUDY  08/13/2019   Procedure: [place] IMPEDANCE STUDY;  Surgeon: Irving Copas., MD;  [facility];  Service: Gastroenterology;;   polyp on vocal chord      TONSILLECTOMY  1958   Patient Active Problem List   Diagnosis Date Noted   Lateral pain of right hip 11/29/2021   Hoarseness 09/23/2021   Allergic angioedema 09/06/2021   Acute pain of right shoulder 05/06/2021   Anxiety 04/06/2021   Dextroscoliosis 04/06/2021   Senile purpura ([place])  03/31/2020   Hiatal hernia 06/15/2019   Functional heartburn 06/15/2019   Abnormal findings on esophagogastroduodenoscopy (EGD) 04/26/2018   Atypical chest pain 02/26/2018   Asthma-COPD overlap syndrome 01/31/2018   Osteopenia 12/10/2014   Allergic urticaria 12/10/2014   Hypothyroid    Primary HSV infection of mouth     PCP: Binnie Rail, MD  REFERRING PROVIDER: Binnie Rail, MD  REFERRING DIAG: [phone] (ICD-10-CM) - Lateral pain of right hip  THERAPY DIAG:  Pain in right hip  Other muscle spasm  Difficulty in walking, not elsewhere classified  Rationale for Evaluation and Treatment: Rehabilitation  ONSET DATE: October 2023  SUBJECTIVE:   SUBJECTIVE STATEMENT: I walked Saturday and Sunday 3-4 miles.  I want to do the plank on the wall but it was bothering my back and the bridge was feeling it in my hip.  Walking is okay and no pain.  My pain is   Eval Statement: I have been able to play pickleball, workout, walking 3-4. Walking, pickleball, gym exercises causing increased lateral hip and upper leg pain. Using Voltaren on hip and hands before activities. Piriformis stretch on Rt hurts the greater trochanter.  Pt has osteoporosis -2.9T score.  Pt states hip hurts lying on it at night and sometimes it hurts when standing after sitting.  Pt is sitting 45-60 min when that  happens.  PERTINENT HISTORY: Osteoporosis, some autoimmune symptoms, COPD (mild),  PAIN:  Are you having pain? Yes NPRS scale: 5/10 Pain location: hip Pain orientation: Right  PAIN TYPE: aching, bruised Pain description: intermittent  Aggravating factors: sleeping, sometimes after sitting a while Relieving factors: moving  PRECAUTIONS: None  WEIGHT BEARING RESTRICTIONS: No  FALLS:  Has patient fallen in last 6 months? No  LIVING ENVIRONMENT: Lives with: lives with their spouse Lives in: House/apartment   OCCUPATION: retired - Market researcher,   PLOF: Independent  PATIENT GOALS: be  able to sleep on Rt side and not have hip pain  NEXT MD VISIT: NA  OBJECTIVE:   DIAGNOSTIC FINDINGS:   PATIENT SURVEYS:  FOTO = 38  COGNITION: Overall cognitive status: Within functional limits for tasks assessed     SENSATION: Not tested  EDEMA:    MUSCLE LENGTH: Hamstrings: 20-30 deg knee bend with toe touch Thomas test:  POSTURE: rounded shoulders, decreased lumbar lordosis, increased thoracic kyphosis, flexed trunk , and scoliosis  PALPATION: Tender around greater trochanter on the Rt side; IT band and TFL tenderness  LOWER EXTREMITY ROM:  Passive ROM Right eval Left eval  Hip flexion    Hip extension    Hip abduction    Hip adduction +pain   Hip internal rotation    Hip external rotation 75% +pain   Knee flexion    Knee extension    Ankle dorsiflexion    Ankle plantarflexion    Ankle inversion    Ankle eversion     (Blank rows = not tested) LUMBAR ROM:   Active  A/PROM  eval  Flexion WFL +pain coming up  Extension WFL   Right lateral flexion pain  Left lateral flexion pain  Right rotation WFL   Left rotation WFL    (Blank rows = not tested)  LOWER EXTREMITY MMT:  MMT Right eval Left eval Right 04/10/22   Hip flexion     Hip extension 4/5 5/5   Hip abduction 4/5 5/5 5/5  Hip adduction 5/5 5/5 5/5  Hip internal rotation 4/5 5/5 5/5  Hip external rotation 4/5 5/5 5/5  Knee flexion     Knee extension     Ankle dorsiflexion     Ankle plantarflexion     Ankle inversion     Ankle eversion      (Blank rows = not tested)  LOWER EXTREMITY SPECIAL TESTS:  Hip special tests: Saralyn Pilar (FABER) test: positive  for pain on Rt lateral hip  FUNCTIONAL TESTS:  Single leg stand 10 sec bil - trendelenburg and needs UE 1x for Rt side  GAIT: Distance walked: from gym to room 4 Assistive device utilized: no Level of assistance: Complete Independence Comments: flexed trunk, internal hip rotation Rt>Lt   TODAY'S TREATMENT:  DATE: 04/10/22             Manual:  Gluteals, Itband, TFL, vastus lateralis with addaday and fascial release  Ionto: Rt greater trochanteric bursa - 18mL 4-6 hour patch #4  Exercises: Hip flexion, ER, abduction, IR in sitting Review amount of exercise to do - starting with 5 reps and do daily then build from there  PATIENT EDUCATION:  Education details: Access Code: [email or web address] Person educated: Patient Education method: Explanation, Demonstration, Tactile cues, Verbal cues, and Handouts Education comprehension: verbalized understanding and returned demonstration  HOME EXERCISE PROGRAM: Access Code: [email or web address] URL: [email or web address] Date: 01/18/2022 Prepared by: Jari Favre  Exercises - Push-Up on Counter  - 1 x daily - 7 x weekly - 2-3 sets - 10 reps - Plank on Counter  - 1 x daily - 7 x weekly - 2-3 sets - 10 reps - Standing Lumbar Extension at [place]  - 1 x daily - 7 x weekly - 3 sets - 10 reps - Standing with Forearms Thoracic Rotation  - 1 x daily - 7 x weekly - 3 sets - 10 reps - Marching Bridge  - 1 x daily - 7 x weekly - 1 sets - 10 reps - Bridge with Hip Abduction and Resistance  - 1 x daily - 7 x weekly - 3 sets - 10 reps  ASSESSMENT:  CLINICAL IMPRESSION: Pt does have improved strength as noted above for Rt hip assessment.  Pt gets a little inflamed after MMT of Rt hip. Pt needing very gradual strengthening to better manage the inflammatory process that naturally occurs with exercises.  Continues to have good response to manual techniques and ionto #4 and will benefit from skilled PT to continue with this for longer lasting improvements expected.    OBJECTIVE IMPAIRMENTS: decreased endurance, difficulty walking, decreased ROM, decreased strength, postural dysfunction, and pain.   ACTIVITY LIMITATIONS: sitting, sleeping, and  exercise  PARTICIPATION LIMITATIONS: community activity  PERSONAL FACTORS: Age, Time since onset of injury/illness/exacerbation, and 1-2 comorbidities: COPD, arthritis, osteoporosis  are also affecting patient's functional outcome.   REHAB POTENTIAL: Excellent  CLINICAL DECISION MAKING: Stable/uncomplicated  EVALUATION COMPLEXITY: Low   GOALS: Goals reviewed with patient? Yes  SHORT TERM GOALS: Target date: 02/15/22 Ind with initial HEP Baseline: Goal status: MET  LONG TERM GOALS: Target date: 06/05/22 Updated 04/10/22  Pt will be independent with advanced HEP to maintain improvements made throughout therapy  Baseline:  Goal status: IN PROGRESS  2.  FOTO is 80 or higher based on predicted outcomes Baseline:  Goal status: IN PROGRESS  3.  Pt will have 5/5 Rt hip abduction and ER for improved stability during pickleball Baseline:  Goal status: IN PROGRESS  4.  Pt will be able to sleep on Rt side for part of the night without pain. Baseline: cannot - only for 1-2 minutes Goal status: IN PROGRESS  5.  Pt will have at least 3/4 days that are pain free during typical activities. Baseline: only pickle ball brings it out now Goal status: IN PROGRESS   PLAN:  PT FREQUENCY: every other week or monthly  PT DURATION:  6 sessions  PLANNED INTERVENTIONS: Therapeutic exercises, Therapeutic activity, Neuromuscular re-education, Balance training, Gait training, Patient/Family education, Self Care, Joint mobilization, Dry Needling, Electrical stimulation, Cryotherapy, Moist heat, Taping, Traction, Biofeedback, Ionotophoresis 4mg /ml Dexamethasone, Manual therapy, and Re-evaluation  PLAN FOR NEXT SESSION: ionto #5, glute step ups and manual soft tissue lenghtening, gluteal strength progression as tolerated, hip abduction and  glute med hip hike alternating in parallel bars   [employer], PT 04/10/2022, 1:15 PM

## 2022-04-11 ENCOUNTER — Ambulatory Visit: Payer: Medicare PPO | Admitting: Podiatry

## 2022-04-11 DIAGNOSIS — L84 Corns and callosities: Secondary | ICD-10-CM | POA: Diagnosis not present

## 2022-04-11 DIAGNOSIS — B351 Tinea unguium: Secondary | ICD-10-CM

## 2022-04-11 DIAGNOSIS — M79675 Pain in left toe(s): Secondary | ICD-10-CM | POA: Diagnosis not present

## 2022-04-11 DIAGNOSIS — L603 Nail dystrophy: Secondary | ICD-10-CM | POA: Diagnosis not present

## 2022-04-11 DIAGNOSIS — M79674 Pain in right toe(s): Secondary | ICD-10-CM | POA: Diagnosis not present

## 2022-04-11 DIAGNOSIS — M21621 Bunionette of right foot: Secondary | ICD-10-CM | POA: Diagnosis not present

## 2022-04-11 MED ORDER — NEOMYCIN-POLYMYXIN-HC 3.5-10000-1 OT SUSP: OTIC | 0 refills | Status: DC

## 2022-04-11 NOTE — Progress Notes (Unsigned)
Subjective:    Patient ID: Tina Lucas, female    DOB: 05-08-1950, 72 y.o.   MRN: [id number]      HPI Garla is here for a Physical exam and her chronic medical problems.    Currently doing PT for right hip pain.   Has bursitis.  She thinks the PT is helping, but still has some pain.  She is still able to do all of her activities.  Had toenails removed from b/l second toes yesterday -still oozing.    Has had a flare of her aquagenic urticaria.  This started the end of last year and she is working closely with her dermatologist.  Medications and allergies reviewed with patient and updated if appropriate.  Current Outpatient Medications on File Prior to Visit  Medication Sig Dispense Refill   Calcium Carb-Cholecalciferol (CALTRATE 600+D3 PO) Take by mouth.     D 1000 25 MCG (1000 UT) capsule SMARTSIG:1 By Mouth     Digestive Enzymes (MULTI-ENZYME PO) Take by mouth daily before breakfast. Patient takes Pure Encapsulations Digestive Enzymes Ultra     fexofenadine (ALLEGRA) 180 MG tablet Take 180 mg by mouth daily.     fluocinonide cream (LIDEX) [id number] % Apply 1 application topically 2 (two) times daily as needed (FOR PSORIASIS).      hydrocortisone 2.5 % cream      levalbuterol (XOPENEX HFA) 45 MCG/ACT inhaler INHALE 1 TO 2 PUFFS BY MOUTH EVERY 4 HOURS AS NEEDED FOR WHEEZING 15 g 2   Magnesium 250 MG TABS Take 125 mg by mouth daily.     Melatonin 5 MG CHEW Chew by mouth at bedtime.     mometasone-formoterol (DULERA) 200-5 MCG/ACT AERO Inhale 2 puffs into the lungs in the morning and at bedtime. 13 g 11   Multiple Vitamins-Minerals (ONE-A-DAY WOMENS 50+ ADVANTAGE PO) Take by mouth daily.     neomycin-polymyxin-hydrocortisone (CORTISPORIN) 3.5-10000-1 OTIC suspension Apply 1-2 drops daily after soaking and cover with bandaid 10 mL 0   Probiotic Product (PROBIOTIC PO) Take by mouth daily before breakfast. Patient takes Emiliano Dyer Probiotic Eleven (one at breakfast)      triamcinolone (KENALOG) 0.1 % paste      No current facility-administered medications on file prior to visit.    Review of Systems  Constitutional:  Negative for fever.  Eyes:  Negative for visual disturbance.  Respiratory:  Negative for cough, shortness of breath and wheezing.   Cardiovascular:  Negative for chest pain, palpitations and leg swelling.  Gastrointestinal:  Negative for abdominal pain, blood in stool, constipation and diarrhea.       No gerd  Genitourinary:  Negative for dysuria.  Musculoskeletal:  Positive for arthralgias. Negative for back pain.  Skin:  Positive for rash (following with derm).  Neurological:  Negative for light-headedness and headaches.  Psychiatric/Behavioral:  Positive for sleep disturbance. Negative for dysphoric mood. The patient is nervous/anxious.        Objective:   Vitals:   04/12/22 0806 04/12/22 0912  BP: (!) 146/82 134/80  Pulse: 80   Temp: 98.2 F (36.8 C)   SpO2: 98%    Filed Weights   04/12/22 0806  Weight: 120 lb (54.4 kg)   Body mass index is 18.79 kg/m.  BP Readings from Last 3 Encounters:  04/12/22 134/80  11/29/21 122/72  09/23/21 (!) 142/80    Wt Readings from Last 3 Encounters:  04/12/22 120 lb (54.4 kg)  11/29/21 123 lb 3.2 oz (55.9 kg)  09/23/21  123 lb (55.8 kg)       Physical Exam Constitutional: She appears well-developed and well-nourished. No distress.  HENT:  Head: Normocephalic and atraumatic.  Right Ear: External ear normal. Normal ear canal and TM Left Ear: External ear normal.  Normal ear canal and TM Mouth/Throat: Oropharynx is clear and moist.  Eyes: Conjunctivae normal.  Neck: Neck supple. No tracheal deviation present. No thyromegaly present.  No carotid bruit  Cardiovascular: Normal rate, regular rhythm and normal heart sounds.   No murmur heard.  No edema. Pulmonary/Chest: Effort normal and breath sounds normal. No respiratory distress. She has no wheezes. She has no rales.  Breast:  deferred   Abdominal: Soft. She exhibits no distension. There is no tenderness.  Lymphadenopathy: She has no cervical adenopathy.  Skin: Skin is warm and dry. She is not diaphoretic.  Psychiatric: She has a normal mood and affect. Her behavior is normal.     Lab Results  Component Value Date   WBC 6.9 04/12/2022   HGB 15.0 04/12/2022   HCT 43.7 04/12/2022   PLT 318.0 04/12/2022   GLUCOSE 94 04/12/2022   CHOL 213 (H) 04/12/2022   TRIG 46.0 04/12/2022   HDL 103.20 04/12/2022   LDLCALC 100 (H) 04/12/2022   ALT 24 04/12/2022   AST 23 04/12/2022   NA 136 04/12/2022   K 3.7 04/12/2022   CL 98 04/12/2022   CREATININE 0.62 04/12/2022   BUN 9 04/12/2022   CO2 30 04/12/2022   TSH 5.26 04/12/2022   INR 1.0 12/15/2014   HGBA1C 5.1 03/07/2017         Assessment & Plan:   Physical exam: Screening blood work  ordered Exercise  walking pickleball, weights Weight  normal Substance abuse  none   Reviewed recommended immunizations.   Health Maintenance  Topic Date Due   DTaP/Tdap/Td (1 - Tdap) Never done   Zoster Vaccines- Shingrix (1 of 2) Never done   Medicare Annual Wellness (AWV)  03/31/2022   DEXA SCAN  04/14/2023   MAMMOGRAM  06/09/2023   COLONOSCOPY (Pts 45-66yrs Insurance coverage will need to be confirmed)  06/25/2028   Pneumonia Vaccine 32+ Years old  Completed   INFLUENZA VACCINE  Completed   COVID-19 Vaccine  Completed   Hepatitis C Screening  Completed   HPV VACCINES  Aged Out   Fecal DNA (Cologuard)  Discontinued          See Problem List for Assessment and Plan of chronic medical problems.

## 2022-04-11 NOTE — Patient Instructions (Addendum)
  [employer] will not cover routine foot care for patients without diabetes or vascular disease. Trimming of 1 callus would $190 out of pocket  Look for urea 40% cream or ointment and apply to the thickened dry skin / calluses. This can be bought over the counter, at a pharmacy or online such as [employer].    Soak Instructions    THE DAY AFTER THE PROCEDURE  Place 1/4 cup of epsom salts (or betadine, or white vinegar) in a quart of warm tap water.  Submerge your foot or feet with outer bandage intact for the initial soak; this will allow the bandage to become moist and wet for easy lift off.  Once you remove your bandage, continue to soak in the solution for 20 minutes.  This soak should be done twice a day.  Next, remove your foot or feet from solution, blot dry the affected area and cover.  You may use a band aid large enough to cover the area or use gauze and tape.  Apply other medications to the area as directed by the doctor such as polysporin neosporin.  IF YOUR SKIN BECOMES IRRITATED WHILE USING THESE INSTRUCTIONS, IT IS OKAY TO SWITCH TO  WHITE VINEGAR AND WATER. Or you may use antibacterial soap and water to keep the toe clean  Monitor for any signs/symptoms of infection. Call the office immediately if any occur or go directly to the emergency room. Call with any questions/concerns.    [facility] Instructions-Post Nail Surgery  You have had your ingrown toenail and root treated with a chemical.  This chemical causes a burn that will drain and ooze like a blister.  This can drain for 6-8 weeks or longer.  It is important to keep this area clean, covered, and follow the soaking instructions dispensed at the time of your surgery.  This area will eventually dry and form a scab.  Once the scab forms you no longer need to soak or apply a dressing.  If at any time you experience an increase in pain, redness, swelling, or drainage, you should contact the office as soon as possible.

## 2022-04-11 NOTE — Patient Instructions (Addendum)
Blood work was ordered.   The lab is on the first floor.    Medications changes include :   none    A referral was ordered for XXX.     Someone will call you to schedule an appointment.    Return in about 1 year (around 04/12/2023) for Physical Exam.   Health Maintenance, Female Adopting a healthy lifestyle and getting preventive care are important in promoting health and wellness. Ask your health care provider about: The right schedule for you to have regular tests and exams. Things you can do on your own to prevent diseases and keep yourself healthy. What should I know about diet, weight, and exercise? Eat a healthy diet  Eat a diet that includes plenty of vegetables, fruits, low-fat dairy products, and lean protein. Do not eat a lot of foods that are high in solid fats, added sugars, or sodium. Maintain a healthy weight Body mass index (BMI) is used to identify weight problems. It estimates body fat based on height and weight. Your health care provider can help determine your BMI and help you achieve or maintain a healthy weight. Get regular exercise Get regular exercise. This is one of the most important things you can do for your health. Most adults should: Exercise for at least 150 minutes each week. The exercise should increase your heart rate and make you sweat (moderate-intensity exercise). Do strengthening exercises at least twice a week. This is in addition to the moderate-intensity exercise. Spend less time sitting. Even light physical activity can be beneficial. Watch cholesterol and blood lipids Have your blood tested for lipids and cholesterol at 72 years of age, then have this test every 5 years. Have your cholesterol levels checked more often if: Your lipid or cholesterol levels are high. You are older than 73 years of age. You are at high risk for heart disease. What should I know about cancer screening? Depending on your health history and family  history, you may need to have cancer screening at various ages. This may include screening for: Breast cancer. Cervical cancer. Colorectal cancer. Skin cancer. Lung cancer. What should I know about heart disease, diabetes, and high blood pressure? Blood pressure and heart disease High blood pressure causes heart disease and increases the risk of stroke. This is more likely to develop in people who have high blood pressure readings or are overweight. Have your blood pressure checked: Every 3-5 years if you are 64-61 years of age. Every year if you are 85 years old or older. Diabetes Have regular diabetes screenings. This checks your fasting blood sugar level. Have the screening done: Once every three years after age 51 if you are at a normal weight and have a low risk for diabetes. More often and at a younger age if you are overweight or have a high risk for diabetes. What should I know about preventing infection? Hepatitis B If you have a higher risk for hepatitis B, you should be screened for this virus. Talk with your health care provider to find out if you are at risk for hepatitis B infection. Hepatitis C Testing is recommended for: Everyone born from [birth year] through 1966. Anyone with known risk factors for hepatitis C. Sexually transmitted infections (STIs) Get screened for STIs, including gonorrhea and chlamydia, if: You are sexually active and are younger than 73 years of age. You are older than 73 years of age and your health care provider tells you that you are at  risk for this type of infection. Your sexual activity has changed since you were last screened, and you are at increased risk for chlamydia or gonorrhea. Ask your health care provider if you are at risk. Ask your health care provider about whether you are at high risk for HIV. Your health care provider may recommend a prescription medicine to help prevent HIV infection. If you choose to take medicine to prevent HIV, you  should first get tested for HIV. You should then be tested every 3 months for as long as you are taking the medicine. Pregnancy If you are about to stop having your period (premenopausal) and you may become pregnant, seek counseling before you get pregnant. Take 400 to 800 micrograms (mcg) of folic acid every day if you become pregnant. Ask for birth control (contraception) if you want to prevent pregnancy. Osteoporosis and menopause Osteoporosis is a disease in which the bones lose minerals and strength with aging. This can result in bone fractures. If you are 65 years old or older, or if you are at risk for osteoporosis and fractures, ask your health care provider if you should: Be screened for bone loss. Take a calcium or vitamin D supplement to lower your risk of fractures. Be given hormone replacement therapy (HRT) to treat symptoms of menopause. Follow these instructions at home: Alcohol use Do not drink alcohol if: Your health care provider tells you not to drink. You are pregnant, may be pregnant, or are planning to become pregnant. If you drink alcohol: Limit how much you have to: 0-1 drink a day. Know how much alcohol is in your drink. In the [place] one drink equals one 12 oz bottle of beer (355 mL), one 5 oz glass of wine (148 mL), or one 1 oz glass of hard liquor (44 mL). Lifestyle Do not use any products that contain nicotine or tobacco. These products include cigarettes, chewing tobacco, and vaping devices, such as e-cigarettes. If you need help quitting, ask your health care provider. Do not use street drugs. Do not share needles. Ask your health care provider for help if you need support or information about quitting drugs. General instructions Schedule regular health, dental, and eye exams. Stay current with your vaccines. Tell your health care provider if: You often feel depressed. You have ever been abused or do not feel safe at home. Summary Adopting a healthy  lifestyle and getting preventive care are important in promoting health and wellness. Follow your health care provider's instructions about healthy diet, exercising, and getting tested or screened for diseases. Follow your health care provider's instructions on monitoring your cholesterol and blood pressure. This information is not intended to replace advice given to you by your health care provider. Make sure you discuss any questions you have with your health care provider. Document Revised: 05/31/2020 Document Reviewed: 05/31/2020 Elsevier Patient Education  [place].

## 2022-04-12 ENCOUNTER — Ambulatory Visit (INDEPENDENT_AMBULATORY_CARE_PROVIDER_SITE_OTHER): Payer: Medicare PPO | Admitting: Internal Medicine

## 2022-04-12 ENCOUNTER — Encounter: Payer: Self-pay | Admitting: Internal Medicine

## 2022-04-12 VITALS — BP 134/80 | HR 80 | Temp 98.2°F | Ht 67.0 in | Wt 120.0 lb

## 2022-04-12 DIAGNOSIS — J4489 Other specified chronic obstructive pulmonary disease: Secondary | ICD-10-CM

## 2022-04-12 DIAGNOSIS — M85859 Other specified disorders of bone density and structure, unspecified thigh: Secondary | ICD-10-CM

## 2022-04-12 DIAGNOSIS — L508 Other urticaria: Secondary | ICD-10-CM | POA: Diagnosis not present

## 2022-04-12 DIAGNOSIS — Z0000 Encounter for general adult medical examination without abnormal findings: Secondary | ICD-10-CM

## 2022-04-12 DIAGNOSIS — E039 Hypothyroidism, unspecified: Secondary | ICD-10-CM

## 2022-04-12 DIAGNOSIS — F419 Anxiety disorder, unspecified: Secondary | ICD-10-CM | POA: Diagnosis not present

## 2022-04-12 LAB — COMPREHENSIVE METABOLIC PANEL
ALT: 24 U/L (ref 0–35)
AST: 23 U/L (ref 0–37)
Albumin: 4.4 g/dL (ref 3.5–5.2)
Alkaline Phosphatase: 87 U/L (ref 39–117)
BUN: 9 mg/dL (ref 6–23)
CO2: 30 mEq/L (ref 19–32)
Calcium: 9.7 mg/dL (ref 8.4–10.5)
Chloride: 98 mEq/L (ref 96–112)
Creatinine, Ser: 0.62 mg/dL (ref 0.40–1.20)
GFR: 88.76 mL/min (ref >60.00)
Glucose, Bld: 94 mg/dL (ref 70–99)
Potassium: 3.7 mEq/L (ref 3.5–5.1)
Sodium: 136 mEq/L (ref 135–145)
Total Bilirubin: 0.6 mg/dL (ref 0.2–1.2)
Total Protein: 7.2 g/dL (ref 6.0–8.3)

## 2022-04-12 LAB — LIPID PANEL
Cholesterol: 213 mg/dL — ABNORMAL HIGH (ref 0–200)
HDL: 103.2 mg/dL (ref >39.00)
LDL Cholesterol: 100 mg/dL — ABNORMAL HIGH (ref 0–99)
NonHDL: 109.51
Total CHOL/HDL Ratio: 2
Triglycerides: 46 mg/dL (ref 0.0–149.0)
VLDL: 9.2 mg/dL (ref 0.0–40.0)

## 2022-04-12 LAB — CBC WITH DIFFERENTIAL/PLATELET
Basophils Absolute: 0.1 10*3/uL (ref 0.0–0.1)
Basophils Relative: 1.2 % (ref 0.0–3.0)
Eosinophils Absolute: 0.4 10*3/uL (ref 0.0–0.7)
Eosinophils Relative: 6.4 % — ABNORMAL HIGH (ref 0.0–5.0)
HCT: 43.7 % (ref 36.0–46.0)
Hemoglobin: 15 g/dL (ref 12.0–15.0)
Lymphocytes Relative: 13.7 % (ref 12.0–46.0)
Lymphs Abs: 0.9 10*3/uL (ref 0.7–4.0)
MCHC: 34.4 g/dL (ref 30.0–36.0)
MCV: 91.4 fl (ref 78.0–100.0)
Monocytes Absolute: 0.6 10*3/uL (ref 0.1–1.0)
Monocytes Relative: 9.1 % (ref 3.0–12.0)
Neutro Abs: 4.8 10*3/uL (ref 1.4–7.7)
Neutrophils Relative %: 69.6 % (ref 43.0–77.0)
Platelets: 318 10*3/uL (ref 150.0–400.0)
RBC: 4.78 Mil/uL (ref 3.87–5.11)
RDW: 13.2 % (ref 11.5–15.5)
WBC: 6.9 10*3/uL (ref 4.0–10.5)

## 2022-04-12 LAB — TSH: TSH: 5.26 u[IU]/mL (ref 0.35–5.50)

## 2022-04-12 LAB — VITAMIN D 25 HYDROXY (VIT D DEFICIENCY, FRACTURES): VITD: 50.91 ng/mL (ref 30.00–100.00)

## 2022-04-12 MED ORDER — SERTRALINE HCL 25 MG PO TABS: 12.5000 mg | ORAL_TABLET | Freq: Every day | ORAL | 3 refills | Status: DC

## 2022-04-12 MED ORDER — CELECOXIB 100 MG PO CAPS: 100.0000 mg | ORAL_CAPSULE | Freq: Every day | ORAL | 3 refills | Status: DC

## 2022-04-12 MED ORDER — LEVOTHYROXINE SODIUM 50 MCG PO TABS: 50.0000 ug | ORAL_TABLET | Freq: Every day | ORAL | 3 refills | Status: DC

## 2022-04-12 NOTE — Assessment & Plan Note (Signed)
Chronic  Clinically euthyroid Currently taking levothyroxine 50 mcg daily Check tsh  Titrate med dose if needed  

## 2022-04-12 NOTE — Assessment & Plan Note (Signed)
Chronic Currently is in a flare that is improving Following closely with Dr. Amy Martinique Her allergist wants to consider Dupixent if there is no improvement

## 2022-04-12 NOTE — Progress Notes (Signed)
  Subjective:  Patient ID: Tina Lucas, female    DOB: 1950-04-14,  MRN: [id number]  Chief Complaint  Patient presents with   Nail Problem    Nail trim and  bilateral second toe nail pain (right worse) -referred by Elisha Ponder    72 y.o. female presents with the above complaint. History confirmed with patient.  Her nails are thickened and elongated causing pain discomfort.  She has had intermittent debridement of these that has been helpful.  She notes persistent pain that does not improve even after debridements in the bilateral second toe.  She also started callus on the outside of the right foot.  She likes to be active and play pickle ball.  Objective:  Physical Exam: warm, good capillary refill, no trophic changes or ulcerative lesions, normal DP and PT pulses, and normal sensory exam. Left Foot: dystrophic yellowed discolored nail plates with subungual debris and severe dystrophy and pain of the second toenail especially Right Foot: dystrophic yellowed discolored nail plates with subungual debris and severe dystrophy and pain of the second toenail especially, she has a tailor's bunion on the plantar lateral callus  Assessment:   1. Pain due to onychomycosis of toenails of both feet   2. Nail dystrophy   3. Callus of foot   4. Tailor's bunionette, right      Plan:  Patient was evaluated and treated and all questions answered.  Discussed the etiology and treatment options for the condition in detail with the patient.  Routine debridement has been helpful.. Recommended debridement of the nails today. Sharp and mechanical debridement performed of all painful and mycotic nails today. Nails debrided in length and thickness using a nail nipper to level of comfort. Discussed treatment options including appropriate shoe gear. Follow up as needed for painful nails.  Regarding the callus on the outside of the right foot I recommended utilizing a pumice stone and urea cream daily in order to  reduce the hyperkeratosis and prevent quickly it redevelops.  Discussed this would be considered routine care likely would not be covered for her without a qualifying diagnosis in the future if she would like debrided   Finally also discussed treatment of her severe nail dystrophy of the second toenail.  This has been particularly limiting and painful.  I do not think that there is a reasonable way to make a normal toenail from this again.  I recommended permanent removal of the entire toenail.  Following consent and digital block with lidocaine and Marcaine the bilateral second toenail was avulsed from the nail bed.  3 applications of phenolic acid were applied for 30 seconds each.  These were then irrigated with alcohol and dressed with Silvadene and a bandage.  Post care instructions were given including Cortisporin drops sent to pharmacy advised to do this with soaks for 2 to 4 weeks.  Return as needed to see me.  Return in 3 months (on 07/12/2022), or if symptoms worsen or fail to improve, for RFC.

## 2022-04-12 NOTE — Assessment & Plan Note (Signed)
Chronic Stable On inhalers Had recent CT Sees Dr Loanne Drilling tomorrow

## 2022-04-12 NOTE — Assessment & Plan Note (Signed)
Chronic Currently taking sertraline 12.5 mg daily-we will continue.  She can continue to taper down if she wishes 

## 2022-04-12 NOTE — Assessment & Plan Note (Addendum)
Chronic DEXA up-to-date exercises regularly Taking calcium 600 mg daily, tumw 750 mg at night and vitamin d Ck vitamin d level

## 2022-04-13 ENCOUNTER — Ambulatory Visit (HOSPITAL_BASED_OUTPATIENT_CLINIC_OR_DEPARTMENT_OTHER): Payer: Medicare PPO | Admitting: Pulmonary Disease

## 2022-04-13 ENCOUNTER — Other Ambulatory Visit (HOSPITAL_COMMUNITY): Payer: Self-pay

## 2022-04-13 ENCOUNTER — Encounter (HOSPITAL_BASED_OUTPATIENT_CLINIC_OR_DEPARTMENT_OTHER): Payer: Self-pay | Admitting: Pulmonary Disease

## 2022-04-13 VITALS — BP 132/60 | HR 85 | Ht 67.0 in | Wt 123.6 lb

## 2022-04-13 DIAGNOSIS — R918 Other nonspecific abnormal finding of lung field: Secondary | ICD-10-CM | POA: Diagnosis not present

## 2022-04-13 DIAGNOSIS — J4489 Other specified chronic obstructive pulmonary disease: Secondary | ICD-10-CM

## 2022-04-13 MED ORDER — BUDESONIDE-FORMOTEROL FUMARATE 160-4.5 MCG/ACT IN AERO: 2.0000 | INHALATION_SPRAY | Freq: Two times a day (BID) | RESPIRATORY_TRACT | 11 refills | Status: DC

## 2022-04-13 NOTE — Progress Notes (Signed)
Subjective:   PATIENT ID: Tina Lucas GENDER: female DOB: [birth year], MRN: [id number]   HPI  Chief Complaint  Patient presents with   Follow-up    Breathing doing well from an asthma point    Reason for Visit: Follow-up  [name] is a 72 year old female never smoker with childhood asthma (shots, nebulizers and inhalers) who presents for follow-up.  02/16/20 Since our last visit, she reports shortness of breath that restarted when she cut back on prilosec 40 mg BID. She had decreased her PPI after her GI work-up but noticed gradually worsening symptoms like belching, retrosternal burning. This is associated with shortness of breath and needing her Xopenex once a day. Denies cough or wheezing. She discussed with her PCP last week and is restarting her PPI. Otherwise she has been compliant with her Dulera. Still plays pickleball with her grandchildren. Symptoms are triggered by activity and heat. No cough or wheezing.  11/18/20 Since our last visit, she had COVID-19 in 07/2021 and has recovered fully. She has been compliant with her Dulera. She is playing pickleball 3-4 times a week and only needing xopenex once a day for shortness of breath. Denies wheezing or coughing She is pleased with her current quality of life. She is anxious regarding her CT scan.  04/25/21 She continues to be active active as baseline. Compliant with her Dulera. Only uses her xopenex daily and as needed when playing pickleball for shortness of breath. Denies coughing or wheezing.   04/13/22 Presents for follow-up for asthma and CT scan. Compliant on Dulera for her asthma. Denies coughing and wheezing. Does use Xopenex 2-3 times a week prior to pickleball which she plays 3x a week. Plays for 3 hour sessions and does not need rescue usually. Walk 4 miles 3x a week. Goes to the gym to 3x a week for upper and lower workout. Patient reports near the time of the CT scan she was treated for recurrence of severe  eczema in the face and upper body, which she previously needed methotrexate in 2013. Advised by Dermatology for Madrone but she has declined as it has cleared with steroid creams and she does not like shots. Usually exacerbates with hot showers, sweat, heat along tight areas associated with her clothes.  Asthma Control Test ACT Total Score  04/13/2022  9:23 AM 22  02/16/2020  9:29 AM 12  12/15/2019  9:47 AM 20   Social History: Never smoker Retired Pharmacist, hospital MS degree in exercise physiology No exposures to COVID-19.  She lives in [place] near her grandchildren  Environmental exposures:  Significant second-hand (father) smoke exposure and noticeably improved after moving for college.   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     celecoxib (CELEBREX) 100 MG capsule Take 1 capsule (100  mg total) by mouth daily. 90 capsule 3   D 1000 25 MCG (1000 UT) capsule SMARTSIG:1 By Mouth     Digestive Enzymes (MULTI-ENZYME PO) Take by mouth daily before breakfast. Patient takes Pure Encapsulations Digestive Enzymes Ultra     fexofenadine (ALLEGRA) 180 MG tablet Take 180 mg by mouth daily.     fluocinonide cream (LIDEX) 3.53 % Apply 1 application topically 2 (two) times daily as needed (FOR PSORIASIS).      hydrocortisone 2.5 % cream       levalbuterol (XOPENEX HFA) 45 MCG/ACT inhaler INHALE 1 TO 2 PUFFS BY MOUTH EVERY 4 HOURS AS NEEDED FOR WHEEZING 15 g 2   levothyroxine (SYNTHROID) 50 MCG tablet Take 1 tablet (50 mcg total) by mouth daily with breakfast. 90 tablet 3   Magnesium 250 MG TABS Take 125 mg by mouth daily.     Melatonin 5 MG CHEW Chew by mouth at bedtime.     Multiple Vitamins-Minerals (ONE-A-DAY WOMENS 50+ ADVANTAGE PO) Take by mouth daily.     neomycin-polymyxin-hydrocortisone (CORTISPORIN) 3.5-10000-1 OTIC suspension Apply 1-2 drops daily after soaking and cover with bandaid 10 mL 0   Probiotic Product (PROBIOTIC PO) Take by mouth daily before breakfast. Patient takes Emiliano Dyer Probiotic Eleven (one at breakfast)     sertraline (ZOLOFT) 25 MG tablet Take 0.5 tablets (12.5 mg total) by mouth daily. 45 tablet 3   triamcinolone (KENALOG) 0.1 % paste      mometasone-formoterol (DULERA) 200-5 MCG/ACT AERO Inhale 2 puffs into the lungs in the morning and at bedtime. 13 g 11   No facility-administered medications prior to visit.    Review of Systems  Constitutional:  Negative for chills, diaphoresis, fever, malaise/fatigue and weight loss.  HENT:  Negative for congestion.   Respiratory:  Negative for cough, hemoptysis, sputum production, shortness of breath and wheezing.   Cardiovascular:  Negative for chest pain, palpitations and leg swelling.    Objective:   Vitals:   04/13/22 0906  BP: 132/60  Pulse: 85  SpO2: 100%  Weight: 123 lb 9.6 oz (56.1 kg)  Height: 5\' 7"  (1.702 m)    SpO2: 100 % O2 Device: None (Room air)  Physical Exam: General: Well-appearing, no acute distress HENT: [place], AT Eyes: EOMI, no scleral icterus Respiratory: Clear to auscultation bilaterally.  No crackles, wheezing or rales Cardiovascular: RRR, -M/R/G, no JVD Extremities:-Edema,-tenderness Neuro: AAO x4, CNII-XII grossly intact Psych: Normal mood, normal affect  Data Reviewed:  Imaging: CT Chest 08/01/18- RUL GGO nodule  measured 33mm and solid nodule measured 70mm CT Chest 01/30/19 - RUL nodule increased to 54mm. Scattered lung nodules <6mm in left and right upper lobes. CT Chest 02/16/19 - Unchanged prior lung nodules. Interval solid 86mm RUL and multifocal ground glass nodular opacities. CT Chest 01/30/20 - Interval development of solid 17mm nodule in RUL with adjacent smaller nodes and ground glass nodule in RUL 11x40mm. Remaininig nodules unchanged. CT Chest 08/04/20 - New 7 mm nodule in the anterior right lung apex with mild subpleural retraction. Previously seen 5 and 11 mm nodules in the right upper lobe have resolved. New tiny clustered centrilobular nodules in the central right lower lobe, nonspecific resolved CT Chest 11/15/20 - Interval RLL subsolid nodule ~13mm. Similar/slightly decreased lesions in RUL and LUL CT chest 02/15/2021- Resolved right lower lobe nodule, no longer present.  Stable right upper lobe nodules ~38mm CT Chest  04/05/22  Interval development of multifocal peribronchial nodular ground-glass pulmonary infiltrates, most severe within the right  upper lobe, likely infectious or inflammatory in the acute setting. Hyperinflation  PFT: 08/27/2018 FVC 2.78 (78 %) FEV1 1.76 (65 %) Ratio 63 TLC 117 % RV 175% RV/TLC 144% DLCO 107 % Interpretation: Moderate obstructive defect with air trapping.  No significant bronchodilator response.  Normal TLC.  Normal DLCO  Labs: Since 2015 absolute eos range from 400-600. Absolute eos  08/06/18 - 500 03/31/19 - 200 04/06/21 - 400 04/13/22 - 400    Assessment & Plan:   Discussion: 72 year old female never smoker with history of childhood asthma with moderate eosinophilic asthma and pulmonary nodules who presents for follow-up. She has history of waxing and waning nodules since 2020 that has been followed on surveillance imaging. Recent CT imaging from 04/04/22 reviewed with patient demonstrating interval ground glass infiltrates with increased density in RUL. This likely  inflammatory and infectious. She is asymptomatic from a respiratory standpoint on her current inhaler regimen.  Moderate persistent COPD asthma overlap- well-controlled --STOP Dulera due to non-formulary on insurance --START Symbicort 160-4.5 mcg TWO puffs TWICE a day. REFILLED  Discussed de-escalating ONE puff TWICE a day during the summer --CONTINUE Xopenex inhaler as needed for chest tightness. OK to take before activity. --CONTINUE Protonix --Up-to-date on vaccinations   Multiple subsolid lung nodules Waxing and waning pulmonary nodules.  Last CT on 04/05/22 reviewed as noted above --ORDER CT Chest without contrast in 6 months (Sept 2024)  Health Maintenance Immunization History  Administered Date(s) Administered   COVID-19, mRNA, vaccine(Comirnaty)12 years and older 10/13/2021   Fluad Quad(high Dose 65+) 10/09/2018, 10/28/2020, 10/28/2021   Influenza Split 10/30/2013   Influenza, High Dose Seasonal PF 10/25/2017, 10/17/2019, 10/28/2021   Influenza-Unspecified 10/19/2014, 10/19/2015, 10/27/2016   PFIZER(Purple Top)SARS-COV-2 Vaccination 02/12/2019, 03/05/2019, 10/30/2019   Pfizer Covid-19 Vaccine Bivalent Booster 30yrs & up 11/23/2020, 06/17/2021, 10/13/2021   Pneumococcal Conjugate-13 11/28/2013   Pneumococcal Polysaccharide-23 03/29/2018   Respiratory Syncytial Virus Vaccine,Recomb Aduvanted(Arexvy) 01/02/2022   Zoster, Live 02/18/2009   Orders Placed This Encounter  Procedures   CT Chest Wo Contrast    Standing Status:   Future    Standing Expiration Date:   04/13/2023    Scheduling Instructions:     Schedule in 6 months (Sept 2024)    Order Specific Question:   Preferred imaging location?    Answer:   [facility]    Meds ordered this encounter  Medications   budesonide-formoterol (SYMBICORT) 160-4.5 MCG/ACT inhaler    Sig: Inhale 2 puffs into the lungs in the morning and at bedtime.    Dispense:  1 each    Refill:  11    Return in about 6 months (around  10/14/2022) for for 30 min slot, with Dr. Loanne Drilling, after CT scan.  I have spent a total time of 35-minutes on the day of the appointment reviewing prior documentation, coordinating care and discussing medical diagnosis and plan with the patient/family. Past medical history, allergies, medications were reviewed. Pertinent imaging, labs and tests included in this note have been reviewed and interpreted independently by me.  [place], MD [facility] 04/13/2022 9:48 AM  Office Number [phone]

## 2022-04-18 ENCOUNTER — Ambulatory Visit: Payer: Medicare PPO | Admitting: Podiatry

## 2022-04-18 NOTE — Therapy (Unsigned)
OUTPATIENT PHYSICAL THERAPY LOWER EXTREMITY TREATMENT   Patient Name: Tina Lucas MRN: [id number] DOB:[birth year], 72 y.o., female Today's Date: 04/19/2022  END OF SESSION:  PT End of Session - 04/19/22 1155     Visit Number 7    Date for PT Re-Evaluation 06/05/22    Authorization Type [employer]-  6 visits until 05/10/22    PT Start Time 1153    PT Stop Time 1225    PT Time Calculation (min) 32 min    Activity Tolerance Patient tolerated treatment well    Behavior During Therapy [facility] for tasks assessed/performed                   Past Medical History:  Diagnosis Date   Allergic rhinitis    Allergy    Asthma    as a child   Cataract    surgical correction, 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   COPD (chronic obstructive pulmonary disease) ([place])    History of asthma 1956-2000   Hypothyroid 2013   Osteopenia    Primary HSV infection of mouth 2008   Pulmonary nodule    Sunlight-induced angio-edema-urticaria 07/11/2011   unknown trigger, neg autoimmune workup   Past Surgical History:  Procedure Laterality Date   16 HOUR [place] STUDY N/A 08/13/2019   Procedure: 24 HOUR PH STUDY- impedence;  Surgeon: Irving Copas., MD;  Location: [facility];  Service: Gastroenterology;  Laterality: N/A;   COLONOSCOPY     ESOPHAGEAL MANOMETRY N/A 08/13/2019   Procedure: ESOPHAGEAL MANOMETRY (EM);  Surgeon: Irving Copas., MD;  Location: [facility];  Service: Gastroenterology;  Laterality: N/A;   [place] IMPEDANCE STUDY  08/13/2019   Procedure: [place] IMPEDANCE STUDY;  Surgeon: Irving Copas., MD;  Location: [facility];  Service: Gastroenterology;;   polyp on vocal chord      TONSILLECTOMY  1958   Patient Active Problem List   Diagnosis Date Noted   Aquagenic urticaria 04/12/2022   Lateral pain of right hip 11/29/2021   Allergic angioedema 09/06/2021   Anxiety 04/06/2021   Dextroscoliosis 04/06/2021   Senile purpura ([place]) 03/31/2020   Hiatal hernia 06/15/2019    Functional heartburn 06/15/2019   Abnormal findings on esophagogastroduodenoscopy (EGD) 04/26/2018   Atypical chest pain 02/26/2018   Asthma-COPD overlap syndrome 01/31/2018   Osteopenia 12/10/2014   Allergic urticaria 12/10/2014   Hypothyroid    Primary HSV infection of mouth     PCP: Binnie Rail, MD  REFERRING PROVIDER: Binnie Rail, MD  REFERRING DIAG: [phone] (ICD-10-CM) - Lateral pain of right hip  THERAPY DIAG:  Pain in right hip  Other muscle spasm  Difficulty in walking, not elsewhere classified  Rationale for Evaluation and Treatment: Rehabilitation  ONSET DATE: October 2023  SUBJECTIVE:   SUBJECTIVE STATEMENT: I had two  toe nails taken off so not playing pickle ball right now.  Overall, maybe some stronger in the hip.   Eval Statement: I have been able to play pickleball, workout, walking 3-4. Walking, pickleball, gym exercises causing increased lateral hip and upper leg pain. Using Voltaren on hip and hands before activities. Piriformis stretch on Rt hurts the greater trochanter.  Pt has osteoporosis -2.9T score.  Pt states hip hurts lying on it at night and sometimes it hurts when standing after sitting.  Pt is sitting 45-60 min when that happens.  PERTINENT HISTORY: Osteoporosis, some autoimmune symptoms, COPD (mild),  PAIN:  Are you having pain? Yes NPRS scale: 5/10 Pain location: hip Pain orientation:  Right  PAIN TYPE: aching, bruised Pain description: intermittent  Aggravating factors: sleeping, sometimes after sitting a while Relieving factors: moving  PRECAUTIONS: None  WEIGHT BEARING RESTRICTIONS: No  FALLS:  Has patient fallen in last 6 months? No  LIVING ENVIRONMENT: Lives with: lives with their spouse Lives in: House/apartment   OCCUPATION: retired - Market researcher,   PLOF: Independent  PATIENT GOALS: be able to sleep on Rt side and not have hip pain  NEXT MD VISIT: NA  OBJECTIVE:   DIAGNOSTIC FINDINGS:   PATIENT  SURVEYS:  FOTO = 57  COGNITION: Overall cognitive status: Within functional limits for tasks assessed     SENSATION: Not tested  EDEMA:    MUSCLE LENGTH: Hamstrings: 20-30 deg knee bend with toe touch Thomas test:  POSTURE: rounded shoulders, decreased lumbar lordosis, increased thoracic kyphosis, flexed trunk , and scoliosis  PALPATION: Tender around greater trochanter on the Rt side; IT band and TFL tenderness  LOWER EXTREMITY ROM:  Passive ROM Right eval Left eval  Hip flexion    Hip extension    Hip abduction    Hip adduction +pain   Hip internal rotation    Hip external rotation 75% +pain   Knee flexion    Knee extension    Ankle dorsiflexion    Ankle plantarflexion    Ankle inversion    Ankle eversion     (Blank rows = not tested) LUMBAR ROM:   Active  A/PROM  eval  Flexion WFL +pain coming up  Extension WFL   Right lateral flexion pain  Left lateral flexion pain  Right rotation WFL   Left rotation WFL    (Blank rows = not tested)  LOWER EXTREMITY MMT:  MMT Right eval Left eval Right 04/10/22   Hip flexion     Hip extension 4/5 5/5   Hip abduction 4/5 5/5 5/5  Hip adduction 5/5 5/5 5/5  Hip internal rotation 4/5 5/5 5/5  Hip external rotation 4/5 5/5 5/5  Knee flexion     Knee extension     Ankle dorsiflexion     Ankle plantarflexion     Ankle inversion     Ankle eversion      (Blank rows = not tested)  LOWER EXTREMITY SPECIAL TESTS:  Hip special tests: Saralyn Pilar (FABER) test: positive  for pain on Rt lateral hip  FUNCTIONAL TESTS:  Single leg stand 10 sec bil - trendelenburg and needs UE 1x for Rt side  GAIT: Distance walked: from gym to room 4 Assistive device utilized: no Level of assistance: Complete Independence Comments: flexed trunk, internal hip rotation Rt>Lt   TODAY'S TREATMENT:                                                                                                                              DATE: 04/19/22              Manual:  Gluteals, Itband, TFL, vastus lateralis with  addaday and fascial release  Ionto: Rt greater trochanteric bursa - 75mL 4-6 hour patch #5  Exercises: H/s stretch on vibration plate Review amount of exercise to do - standing on foam mat on vibration plate 15 reps - caused some soreness  PATIENT EDUCATION:  Education details: Access Code: [email or web address] Person educated: Patient Education method: Explanation, Demonstration, Tactile cues, Verbal cues, and Handouts Education comprehension: verbalized understanding and returned demonstration  HOME EXERCISE PROGRAM: Access Code: [email or web address] URL: [email or web address] Date: 01/18/2022 Prepared by: Jari Favre  Exercises - Push-Up on Counter  - 1 x daily - 7 x weekly - 2-3 sets - 10 reps - Plank on Counter  - 1 x daily - 7 x weekly - 2-3 sets - 10 reps - Standing Lumbar Extension at [place]  - 1 x daily - 7 x weekly - 3 sets - 10 reps - Standing with Forearms Thoracic Rotation  - 1 x daily - 7 x weekly - 3 sets - 10 reps - Marching Bridge  - 1 x daily - 7 x weekly - 1 sets - 10 reps - Bridge with Hip Abduction and Resistance  - 1 x daily - 7 x weekly - 3 sets - 10 reps  ASSESSMENT:  CLINICAL IMPRESSION: Pt continues to have good response to manual techniques and ionto #5 today.  Working on ways to progress hip strength but is still very irritable.  Pt educated in using ice for 5-10 minutes after walks and exercise.  Pt will benefit from skilled PT to continue with this for longer lasting improvements expected.    OBJECTIVE IMPAIRMENTS: decreased endurance, difficulty walking, decreased ROM, decreased strength, postural dysfunction, and pain.   ACTIVITY LIMITATIONS: sitting, sleeping, and exercise  PARTICIPATION LIMITATIONS: community activity  PERSONAL FACTORS: Age, Time since onset of injury/illness/exacerbation, and 1-2 comorbidities: COPD, arthritis, osteoporosis  are also affecting patient's  functional outcome.   REHAB POTENTIAL: Excellent  CLINICAL DECISION MAKING: Stable/uncomplicated  EVALUATION COMPLEXITY: Low   GOALS: Goals reviewed with patient? Yes  SHORT TERM GOALS: Target date: 02/15/22 Ind with initial HEP Baseline: Goal status: MET  LONG TERM GOALS: Target date: 06/05/22 Updated 04/10/22  Pt will be independent with advanced HEP to maintain improvements made throughout therapy  Baseline:  Goal status: IN PROGRESS  2.  FOTO is 80 or higher based on predicted outcomes Baseline:  Goal status: IN PROGRESS  3.  Pt will have 5/5 Rt hip abduction and ER for improved stability during pickleball Baseline:  Goal status: IN PROGRESS  4.  Pt will be able to sleep on Rt side for part of the night without pain. Baseline: cannot - only for 1-2 minutes Goal status: IN PROGRESS  5.  Pt will have at least 3/4 days that are pain free during typical activities. Baseline: only pickle ball brings it out now Goal status: IN PROGRESS   PLAN:  PT FREQUENCY: every other week or monthly  PT DURATION:  6 sessions  PLANNED INTERVENTIONS: Therapeutic exercises, Therapeutic activity, Neuromuscular re-education, Balance training, Gait training, Patient/Family education, Self Care, Joint mobilization, Dry Needling, Electrical stimulation, Cryotherapy, Moist heat, Taping, Traction, Biofeedback, Ionotophoresis 4mg /ml Dexamethasone, Manual therapy, and Re-evaluation  PLAN FOR NEXT SESSION: ionto #6, glute step ups and manual soft tissue lenghtening, gluteal strength progression as tolerated, hip abduction and glute med hip hike alternating in parallel bars   [employer], PT 04/19/2022, 1:21 PM

## 2022-04-19 ENCOUNTER — Ambulatory Visit: Payer: Medicare PPO | Admitting: Physical Therapy

## 2022-04-19 ENCOUNTER — Encounter: Payer: Self-pay | Admitting: Physical Therapy

## 2022-04-19 DIAGNOSIS — M25551 Pain in right hip: Secondary | ICD-10-CM | POA: Diagnosis not present

## 2022-04-19 DIAGNOSIS — R262 Difficulty in walking, not elsewhere classified: Secondary | ICD-10-CM | POA: Diagnosis not present

## 2022-04-19 DIAGNOSIS — M62838 Other muscle spasm: Secondary | ICD-10-CM

## 2022-04-21 ENCOUNTER — Ambulatory Visit (HOSPITAL_COMMUNITY): Payer: Medicare PPO

## 2022-04-24 ENCOUNTER — Ambulatory Visit: Payer: Medicare PPO | Attending: Internal Medicine | Admitting: Physical Therapy

## 2022-04-24 DIAGNOSIS — M62838 Other muscle spasm: Secondary | ICD-10-CM | POA: Diagnosis not present

## 2022-04-24 DIAGNOSIS — R262 Difficulty in walking, not elsewhere classified: Secondary | ICD-10-CM | POA: Diagnosis not present

## 2022-04-24 DIAGNOSIS — M25551 Pain in right hip: Secondary | ICD-10-CM | POA: Diagnosis not present

## 2022-04-24 NOTE — Therapy (Signed)
OUTPATIENT PHYSICAL THERAPY LOWER EXTREMITY TREATMENT   Patient Name: Tina Lucas MRN: [id number] DOB:1950-08-30, 72 y.o., female Today's Date: 04/24/2022  END OF SESSION:  PT End of Session - 04/24/22 1401     Visit Number 8    Date for PT Re-Evaluation 06/05/22    Authorization Type [employer]-  6 visits until 05/10/22    PT Start Time 1233    PT Stop Time 1312    PT Time Calculation (min) 39 min    Activity Tolerance Patient tolerated treatment well    Behavior During Therapy [facility] for tasks assessed/performed                    Past Medical History:  Diagnosis Date   Allergic rhinitis    Allergy    Asthma    as a child   Cataract    surgical correction, 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   COPD (chronic obstructive pulmonary disease) ([place])    History of asthma 1956-2000   Hypothyroid 2013   Osteopenia    Primary HSV infection of mouth 2008   Pulmonary nodule    Sunlight-induced angio-edema-urticaria 07/11/2011   unknown trigger, neg autoimmune workup   Past Surgical History:  Procedure Laterality Date   66 HOUR [place] STUDY N/A 08/13/2019   Procedure: 24 HOUR PH STUDY- impedence;  Surgeon: Irving Copas., MD;  [facility];  Service: Gastroenterology;  Laterality: N/A;   COLONOSCOPY     ESOPHAGEAL MANOMETRY N/A 08/13/2019   Procedure: ESOPHAGEAL MANOMETRY (EM);  Surgeon: Irving Copas., MD;  [facility];  Service: Gastroenterology;  Laterality: N/A;   [place] IMPEDANCE STUDY  08/13/2019   Procedure: [place] IMPEDANCE STUDY;  Surgeon: Irving Copas., MD;  Location[facility];  Service: Gastroenterology;;   polyp on vocal chord      TONSILLECTOMY  1958   Patient Active Problem List   Diagnosis Date Noted   Aquagenic urticaria 04/12/2022   Lateral pain of right hip 11/29/2021   Allergic angioedema 09/06/2021   Anxiety 04/06/2021   Dextroscoliosis 04/06/2021   Senile purpura 03/31/2020   Hiatal hernia 06/15/2019    Functional heartburn 06/15/2019   Abnormal findings on esophagogastroduodenoscopy (EGD) 04/26/2018   Atypical chest pain 02/26/2018   Asthma-COPD overlap syndrome 01/31/2018   Osteopenia 12/10/2014   Allergic urticaria 12/10/2014   Hypothyroid    Primary HSV infection of mouth     PCP: Binnie Rail, MD  REFERRING PROVIDER: Binnie Rail, MD  REFERRING DIAG: [phone] (ICD-10-CM) - Lateral pain of right hip  THERAPY DIAG:  Pain in right hip  Other muscle spasm  Difficulty in walking, not elsewhere classified  Rationale for Evaluation and Treatment: Rehabilitation  ONSET DATE: October 2023  SUBJECTIVE:   SUBJECTIVE STATEMENT: I did 12 of every exercise yesterday.  It is irritated today.  Pt walked this morning and going up the hill is was irritated it   Eval Statement: I have been able to play pickleball, workout, walking 3-4. Walking, pickleball, gym exercises causing increased lateral hip and upper leg pain. Using Voltaren on hip and hands before activities. Piriformis stretch on Rt hurts the greater trochanter.  Pt has osteoporosis -2.9T score.  Pt states hip hurts lying on it at night and sometimes it hurts when standing after sitting.  Pt is sitting 45-60 min when that happens.  PERTINENT HISTORY: Osteoporosis, some autoimmune symptoms, COPD (mild),  PAIN:  Are you having pain? Yes NPRS scale: 5/10 Pain location:  hip Pain orientation: Right  PAIN TYPE: aching, bruised Pain description: intermittent  Aggravating factors: sleeping, sometimes after sitting a while Relieving factors: moving  PRECAUTIONS: None  WEIGHT BEARING RESTRICTIONS: No  FALLS:  Has patient fallen in last 6 months? No  LIVING ENVIRONMENT: Lives with: lives with their spouse Lives in: House/apartment   OCCUPATION: retired - Market researcher,   PLOF: Independent  PATIENT GOALS: be able to sleep on Rt side and not have hip pain  NEXT MD VISIT: NA  OBJECTIVE:   DIAGNOSTIC  FINDINGS:   PATIENT SURVEYS:  FOTO = 70  COGNITION: Overall cognitive status: Within functional limits for tasks assessed     SENSATION: Not tested  EDEMA:    MUSCLE LENGTH: Hamstrings: 20-30 deg knee bend with toe touch Thomas test:  POSTURE: rounded shoulders, decreased lumbar lordosis, increased thoracic kyphosis, flexed trunk , and scoliosis  PALPATION: Tender around greater trochanter on the Rt side; IT band and TFL tenderness  LOWER EXTREMITY ROM:  Passive ROM Right eval Left eval  Hip flexion    Hip extension    Hip abduction    Hip adduction +pain   Hip internal rotation    Hip external rotation 75% +pain   Knee flexion    Knee extension    Ankle dorsiflexion    Ankle plantarflexion    Ankle inversion    Ankle eversion     (Blank rows = not tested) LUMBAR ROM:   Active  A/PROM  eval  Flexion WFL +pain coming up  Extension WFL   Right lateral flexion pain  Left lateral flexion pain  Right rotation WFL   Left rotation WFL    (Blank rows = not tested)  LOWER EXTREMITY MMT:  MMT Right eval Left eval Right 04/10/22   Hip flexion     Hip extension 4/5 5/5   Hip abduction 4/5 5/5 5/5  Hip adduction 5/5 5/5 5/5  Hip internal rotation 4/5 5/5 5/5  Hip external rotation 4/5 5/5 5/5  Knee flexion     Knee extension     Ankle dorsiflexion     Ankle plantarflexion     Ankle inversion     Ankle eversion      (Blank rows = not tested)  LOWER EXTREMITY SPECIAL TESTS:  Hip special tests: Saralyn Pilar (FABER) test: positive  for pain on Rt lateral hip  FUNCTIONAL TESTS:  Single leg stand 10 sec bil - trendelenburg and needs UE 1x for Rt side  GAIT: Distance walked: from gym to room 4 Assistive device utilized: no Level of assistance: Complete Independence Comments: flexed trunk, internal hip rotation Rt>Lt   TODAY'S TREATMENT:                                                                                                                               DATE: 04/24/22             Manual:  Gluteals, Itband, TFL,  vastus lateralis with addaday and fascial release  Ionto: Rt greater trochanteric bursa - 35mL 4-6 hour patch #6  Exercises: H/s stretch on vibration plate, mini squat vibration plate Standing weight shift wide stance  PATIENT EDUCATION:  Education details: Access Code: [email or web address] Person educated: Patient Education method: Explanation, Demonstration, Tactile cues, Verbal cues, and Handouts Education comprehension: verbalized understanding and returned demonstration  HOME EXERCISE PROGRAM: Access Code: [email or web address] URL: [email or web address] Date: 01/18/2022 Prepared by: Jari Favre  Exercises - Push-Up on Counter  - 1 x daily - 7 x weekly - 2-3 sets - 10 reps - Plank on Counter  - 1 x daily - 7 x weekly - 2-3 sets - 10 reps - Standing Lumbar Extension at [place]  - 1 x daily - 7 x weekly - 3 sets - 10 reps - Standing with Forearms Thoracic Rotation  - 1 x daily - 7 x weekly - 3 sets - 10 reps - Marching Bridge  - 1 x daily - 7 x weekly - 1 sets - 10 reps - Bridge with Hip Abduction and Resistance  - 1 x daily - 7 x weekly - 3 sets - 10 reps  ASSESSMENT:  CLINICAL IMPRESSION: Pt arrives for final ionto patch.  We were able to find easier glute med exercises with weight shifting since glute med on step was still bothering pt. Pt will d/c with HEP today and is expected to be able to manage pain with exercises and still be able to play pickle ball with minimal pain    OBJECTIVE IMPAIRMENTS: decreased endurance, difficulty walking, decreased ROM, decreased strength, postural dysfunction, and pain.   ACTIVITY LIMITATIONS: sitting, sleeping, and exercise  PARTICIPATION LIMITATIONS: community activity  PERSONAL FACTORS: Age, Time since onset of injury/illness/exacerbation, and 1-2 comorbidities: COPD, arthritis, osteoporosis  are also affecting patient's functional outcome.   REHAB POTENTIAL:  Excellent  CLINICAL DECISION MAKING: Stable/uncomplicated  EVALUATION COMPLEXITY: Low   GOALS: Goals reviewed with patient? Yes  SHORT TERM GOALS: Target date: 02/15/22 Ind with initial HEP Baseline: Goal status: MET  LONG TERM GOALS: Target date: 06/05/22 Updated 04/10/22  Pt will be independent with advanced HEP to maintain improvements made throughout therapy  Baseline:  Goal status: MET  2.  FOTO is 80 or higher based on predicted outcomes Baseline:  Goal status: NOT MET  3.  Pt will have 5/5 Rt hip abduction and ER for improved stability during pickleball Baseline:  Goal status: MET  4.  Pt will be able to sleep on Rt side for part of the night without pain. Baseline: cannot - only for 1-2 minutes Goal status: NOT MET  5.  Pt will have at least 3/4 days that are pain free during typical activities. Baseline: only pickle ball brings it out now Goal status: NOT MET   PLAN:  PT FREQUENCY: every other week or monthly  PT DURATION:  6 sessions  PLANNED INTERVENTIONS: Therapeutic exercises, Therapeutic activity, Neuromuscular re-education, Balance training, Gait training, Patient/Family education, Self Care, Joint mobilization, Dry Needling, Electrical stimulation, Cryotherapy, Moist heat, Taping, Traction, Biofeedback, Ionotophoresis 4mg /ml Dexamethasone, Manual therapy, and Re-evaluation  PLAN FOR NEXT SESSION: d/c today   Jule Ser, PT 04/24/2022, 2:02 PM   PHYSICAL THERAPY DISCHARGE SUMMARY  Visits from Start of Care: 8  Current functional level related to goals / functional outcomes: See above goals   Remaining deficits: See Jethro Poling   Education / Equipment: HEP   Patient agrees to discharge. Patient goals were partially met.  Patient is being discharged due to being pleased with the current functional level.  Gustavus Bryant, PT, DPT 04/24/22 2:06 PM

## 2022-05-01 ENCOUNTER — Other Ambulatory Visit: Payer: Self-pay | Admitting: Pulmonary Disease

## 2022-05-01 ENCOUNTER — Telehealth: Payer: Self-pay | Admitting: Internal Medicine

## 2022-05-01 NOTE — Telephone Encounter (Signed)
Contacted Tina Lucas to schedule their annual wellness visit. Patient declined to schedule AWV at this time.  [facility] Care Guide [facility] AWV TEAM Direct Dial: [phone]

## 2022-05-08 ENCOUNTER — Ambulatory Visit: Payer: Medicare PPO | Admitting: Podiatry

## 2022-05-11 ENCOUNTER — Ambulatory Visit: Payer: Medicare PPO | Admitting: Podiatry

## 2022-05-11 DIAGNOSIS — M79675 Pain in left toe(s): Secondary | ICD-10-CM

## 2022-05-11 DIAGNOSIS — M2041 Other hammer toe(s) (acquired), right foot: Secondary | ICD-10-CM | POA: Diagnosis not present

## 2022-05-11 DIAGNOSIS — M2042 Other hammer toe(s) (acquired), left foot: Secondary | ICD-10-CM | POA: Diagnosis not present

## 2022-05-11 DIAGNOSIS — B351 Tinea unguium: Secondary | ICD-10-CM

## 2022-05-11 DIAGNOSIS — M79674 Pain in right toe(s): Secondary | ICD-10-CM | POA: Diagnosis not present

## 2022-05-11 NOTE — Progress Notes (Signed)
  Subjective:  Patient ID: Tina Lucas, female    DOB: August 30, 1950,  MRN: [id number]  Chief Complaint  Patient presents with   Nail Problem    right toenail that was removed and still concerned painful and 3rd toenail trimmed to close and nail black and still bleeds sometimes/ has been a month and pain is worse than before    72 y.o. female presents with the above complaint. History confirmed with patient.  She returns for follow-up, she has concerns that the toes are not healing well they have been very sensitive.  She is still bandaging every day with 2 Band-Aids and soaking and applying Cortisporin.  She also thinks the knuckles on the toes appear to be sticking out more than they did before the procedure  Objective:  Physical Exam: warm, good capillary refill, no trophic changes or ulcerative lesions, normal DP and PT pulses, normal sensory exam, and matricectomy sites are healing well.  There is maceration.  Granular wound bed of the toenail bed.  Unchanged appearance of hammertoe deformities.  Well-organized subungual hematoma and hemorrhage of the third toenail on the right  Assessment:   1. Pain due to onychomycosis of toenails of both feet   2. Hammertoe of right foot   3. Hammertoe of left foot      Plan:  Patient was evaluated and treated and all questions answered.  Matricectomy sites are healing well she needs to leave open to air at this point they are being over bandaged and macerating.  Advised this will take some time to fully heal.  The third toenail on the right should continue to grow without issue.  I do not think that her hammertoe deformities were created by the procedure itself.  No follow-ups on file.

## 2022-05-17 DIAGNOSIS — L309 Dermatitis, unspecified: Secondary | ICD-10-CM | POA: Diagnosis not present

## 2022-05-17 DIAGNOSIS — L2089 Other atopic dermatitis: Secondary | ICD-10-CM | POA: Diagnosis not present

## 2022-06-01 ENCOUNTER — Ambulatory Visit: Payer: Medicare PPO | Admitting: Podiatry

## 2022-06-12 DIAGNOSIS — Z1231 Encounter for screening mammogram for malignant neoplasm of breast: Secondary | ICD-10-CM | POA: Diagnosis not present

## 2022-06-13 ENCOUNTER — Encounter: Payer: Self-pay | Admitting: Internal Medicine

## 2022-06-13 DIAGNOSIS — R197 Diarrhea, unspecified: Secondary | ICD-10-CM | POA: Insufficient documentation

## 2022-06-13 NOTE — Progress Notes (Unsigned)
    Subjective:    Patient ID: Tina Lucas, female    DOB: March 20, 1950, 72 y.o.   MRN: [id number]      HPI Tina Lucas is here for No chief complaint on file.    Diarrhea -     Medications and allergies reviewed with patient and updated if appropriate.  Current Outpatient Medications on File Prior to Visit  Medication Sig Dispense Refill   budesonide-formoterol (SYMBICORT) 160-4.5 MCG/ACT inhaler Inhale 2 puffs into the lungs in the morning and at bedtime. 1 each 11   Calcium Carb-Cholecalciferol (CALTRATE 600+D3 PO) Take by mouth.     celecoxib (CELEBREX) 100 MG capsule Take 1 capsule (100 mg total) by mouth daily. 90 capsule 3   D 1000 25 MCG (1000 UT) capsule SMARTSIG:1 By Mouth     Digestive Enzymes (MULTI-ENZYME PO) Take by mouth daily before breakfast. Patient takes Pure Encapsulations Digestive Enzymes Ultra     fexofenadine (ALLEGRA) 180 MG tablet Take 180 mg by mouth daily.     fluocinonide cream (LIDEX) 0.05 % Apply 1 application topically 2 (two) times daily as needed (FOR PSORIASIS).      hydrocortisone 2.5 % cream      levalbuterol (XOPENEX HFA) 45 MCG/ACT inhaler INHALE 1 TO 2 PUFFS BY MOUTH EVERY 4 HOURS AS NEEDED FOR WHEEZING 15 g 2   levothyroxine (SYNTHROID) 50 MCG tablet Take 1 tablet (50 mcg total) by mouth daily with breakfast. 90 tablet 3   Magnesium 250 MG TABS Take 125 mg by mouth daily.     Melatonin 5 MG CHEW Chew by mouth at bedtime.     Multiple Vitamins-Minerals (ONE-A-DAY WOMENS 50+ ADVANTAGE PO) Take by mouth daily.     neomycin-polymyxin-hydrocortisone (CORTISPORIN) 3.5-10000-1 OTIC suspension Apply 1-2 drops daily after soaking and cover with bandaid 10 mL 0   Probiotic Product (PROBIOTIC PO) Take by mouth daily before breakfast. Patient takes Shelbie Ammons Probiotic Eleven (one at breakfast)     sertraline (ZOLOFT) 25 MG tablet Take 0.5 tablets (12.5 mg total) by mouth daily. 45 tablet 3   triamcinolone (KENALOG) 0.1 % paste      No current  facility-administered medications on file prior to visit.    Review of Systems     Objective:  There were no vitals filed for this visit. BP Readings from Last 3 Encounters:  04/13/22 132/60  04/12/22 134/80  11/29/21 122/72   Wt Readings from Last 3 Encounters:  04/13/22 123 lb 9.6 oz (56.1 kg)  04/12/22 120 lb (54.4 kg)  11/29/21 123 lb 3.2 oz (55.9 kg)   There is no height or weight on file to calculate BMI.    Physical Exam         Assessment & Plan:    See Problem List for Assessment and Plan of chronic medical problems.

## 2022-06-14 ENCOUNTER — Ambulatory Visit: Payer: Medicare PPO | Admitting: Internal Medicine

## 2022-06-14 VITALS — BP 128/80 | HR 70 | Temp 98.4°F | Ht 67.0 in | Wt 122.6 lb

## 2022-06-14 DIAGNOSIS — E039 Hypothyroidism, unspecified: Secondary | ICD-10-CM | POA: Diagnosis not present

## 2022-06-14 DIAGNOSIS — R922 Inconclusive mammogram: Secondary | ICD-10-CM | POA: Diagnosis not present

## 2022-06-14 DIAGNOSIS — R197 Diarrhea, unspecified: Secondary | ICD-10-CM | POA: Diagnosis not present

## 2022-06-14 DIAGNOSIS — R92322 Mammographic fibroglandular density, left breast: Secondary | ICD-10-CM | POA: Diagnosis not present

## 2022-06-14 LAB — TSH: TSH: 2.7 u[IU]/mL (ref 0.35–5.50)

## 2022-06-14 LAB — COMPREHENSIVE METABOLIC PANEL
ALT: 27 U/L (ref 0–35)
AST: 29 U/L (ref 0–37)
Albumin: 4.4 g/dL (ref 3.5–5.2)
Alkaline Phosphatase: 68 U/L (ref 39–117)
BUN: 11 mg/dL (ref 6–23)
CO2: 29 mEq/L (ref 19–32)
Calcium: 9.5 mg/dL (ref 8.4–10.5)
Chloride: 97 mEq/L (ref 96–112)
Creatinine, Ser: 0.66 mg/dL (ref 0.40–1.20)
GFR: 87.33 mL/min (ref >60.00)
Glucose, Bld: 97 mg/dL (ref 70–99)
Potassium: 3.7 mEq/L (ref 3.5–5.1)
Sodium: 134 mEq/L — ABNORMAL LOW (ref 135–145)
Total Bilirubin: 0.7 mg/dL (ref 0.2–1.2)
Total Protein: 6.9 g/dL (ref 6.0–8.3)

## 2022-06-14 LAB — CBC WITH DIFFERENTIAL/PLATELET
Basophils Absolute: 0.1 10*3/uL (ref 0.0–0.1)
Basophils Relative: 0.9 % (ref 0.0–3.0)
Eosinophils Absolute: 0.2 10*3/uL (ref 0.0–0.7)
Eosinophils Relative: 3.7 % (ref 0.0–5.0)
HCT: 42.8 % (ref 36.0–46.0)
Hemoglobin: 14.2 g/dL (ref 12.0–15.0)
Lymphocytes Relative: 11.1 % — ABNORMAL LOW (ref 12.0–46.0)
Lymphs Abs: 0.7 10*3/uL (ref 0.7–4.0)
MCHC: 33.1 g/dL (ref 30.0–36.0)
MCV: 92.9 fl (ref 78.0–100.0)
Monocytes Absolute: 0.6 10*3/uL (ref 0.1–1.0)
Monocytes Relative: 10.1 % (ref 3.0–12.0)
Neutro Abs: 4.5 10*3/uL (ref 1.4–7.7)
Neutrophils Relative %: 74.2 % (ref 43.0–77.0)
Platelets: 256 10*3/uL (ref 150.0–400.0)
RBC: 4.61 Mil/uL (ref 3.87–5.11)
RDW: 13 % (ref 11.5–15.5)
WBC: 6.1 10*3/uL (ref 4.0–10.5)

## 2022-06-14 LAB — HM MAMMOGRAPHY

## 2022-06-14 NOTE — Assessment & Plan Note (Signed)
Acute Diarrhea started approximately 11 days ago No obvious cause-no change in medications, diet.  Symptoms less likely to be infection 3-4 bowel movements a day-watery, nonbloody No associated abdominal pain, nausea, vomiting, fever, change in diet Does not seem to be improving Check CBC, CMP, TSH Will check stool studies just to make sure Tina Lucas did not come in contact with foodborne illness or having unusual viral diarrhea illness Tina Lucas will avoid Pepto-Bismol or Imodium unless Tina Lucas really needs to take it Depending on results above we will determine if Tina Lucas needs to see GI for possible colonoscopy-to rule out microscopic colitis Advised her to avoid frozen yogurt or other foods that could cause diarrhea to worsen

## 2022-06-14 NOTE — Assessment & Plan Note (Signed)
Chronic Last TSH in normal range, but on the higher end of normal Thyroid unlikely to be related to her current diarrhea Will check TSH today Will adjust medication if necessary Continue levothyroxine 50 mcg daily

## 2022-06-14 NOTE — Patient Instructions (Addendum)
      Blood work was ordered.   The lab is on the first floor.    Medications changes include :   none     Return if symptoms worsen or fail to improve.  

## 2022-06-15 ENCOUNTER — Encounter: Payer: Self-pay | Admitting: Internal Medicine

## 2022-06-16 ENCOUNTER — Encounter: Payer: Self-pay | Admitting: Internal Medicine

## 2022-06-16 NOTE — Progress Notes (Signed)
Outside notes received. Information abstracted. Notes sent to scan.  

## 2022-06-17 LAB — GI PROFILE, STOOL, PCR

## 2022-06-26 ENCOUNTER — Telehealth: Payer: Self-pay | Admitting: Internal Medicine

## 2022-06-26 NOTE — Telephone Encounter (Signed)
Order form for diagnostic mammogram received from [facility] marked "third attempt". Please have provider sign and fax to [facility]. Form up front in provider box.

## 2022-06-26 NOTE — Telephone Encounter (Signed)
Order faxed back to [facility] again.  Fax conformation received where 2 had already been faxed back.

## 2022-07-26 ENCOUNTER — Ambulatory Visit: Payer: Medicare PPO | Admitting: Podiatry

## 2022-07-26 ENCOUNTER — Encounter: Payer: Self-pay | Admitting: Podiatry

## 2022-07-26 DIAGNOSIS — B351 Tinea unguium: Secondary | ICD-10-CM

## 2022-07-26 DIAGNOSIS — M79675 Pain in left toe(s): Secondary | ICD-10-CM

## 2022-07-26 DIAGNOSIS — M79674 Pain in right toe(s): Secondary | ICD-10-CM

## 2022-07-31 NOTE — Progress Notes (Signed)
  Subjective:  Patient ID: Tina Lucas, female    DOB: October 27, 1950,  MRN: [id number]  Tina Lucas presents to clinic today for painful elongated mycotic toenails 1-5 bilaterally which are tender when wearing enclosed shoe gear. Pain is relieved with periodic professional debridement.   Patient presents with concern of toenail right 3rd digit that came off a few months ago. She did express concern at her prior visit, but felt her concern was not addressed at that visit. She would like to know why her toenail came off and if it will grow back. Chief Complaint  Patient presents with   NAIL CARE    RFC   New problem(s): None.   PCP is Pincus Sanes, MD.  Allergies  Allergen Reactions   Singulair [Montelukast Sodium] Other (See Comments)    headaches    Review of Systems: Negative except as noted in the HPI.  Objective: No changes noted in today's physical examination. There were no vitals filed for this visit. Tina Lucas is a pleasant 72 y.o. female WD, WN in NAD. AAO x 3.  Vascular Examination: Capillary refill time <3 seconds b/l LE. Palpable pedal pulses b/l LE. Digital hair present b/l. No pedal edema b/l. Skin temperature gradient WNL b/l. No varicosities b/l. Marland Kitchen  Dermatological Examination: Pedal skin with normal turgor, texture and tone b/l. No open wounds. No interdigital macerations b/l.   Toenails bilateral great toes, b/l 4th, 5th digits and left 3rd digit thickened, discolored, dystrophic with subungual debris. There is pain on palpation to dorsal aspect of nailplates.   Nailbed b/l 2nd toes with hyperkeratotic tissue dorsal procedure sites. Post removal of hyperkeratotic tissue reveals completely healed nailbeds.   Patient brought in intact nailplate from right 3rd digit which possesses characteristics of old subungual hematoma as nailplate with old heme inferiorly. Inspection  Anonychia noted R 3rd toe. Nailbed(s) epithelialized.   Assessment/Plan: 1. Pain due  to onychomycosis of toenails of both feet     -Patient was evaluated and treated. All patient's and/or POA's questions/concerns answered on today's visit. -She is s/p total nail avulsion b/l 2nd digits and the nailbeds are completely healed. -Discussed loose nailplate right 3rd digit appears to be secondary to old subungual hematoma. Reassured Mrs. Rajewski her nailbed is intact and a new toenail should grow back in 3-4 months . -Toenails bilateral great toes, bilateral 4th toes, bilateral 5th toes, and L 3rd toe were debrided in length and girth with sterile nail nippers without iatrogenic bleeding.  -Patient/POA to call should there be question/concern in the interim.   Return in about 3 months (around 10/26/2022).  Freddie Breech, DPM

## 2022-09-20 ENCOUNTER — Ambulatory Visit (HOSPITAL_BASED_OUTPATIENT_CLINIC_OR_DEPARTMENT_OTHER)
Admission: RE | Admit: 2022-09-20 | Discharge: 2022-09-20 | Disposition: A | Discharge status: Home / Self Care | Payer: Medicare PPO | Source: Ambulatory Visit | Attending: Internal Medicine | Admitting: Internal Medicine

## 2022-09-20 DIAGNOSIS — R590 Localized enlarged lymph nodes: Secondary | ICD-10-CM | POA: Diagnosis not present

## 2022-09-20 DIAGNOSIS — R918 Other nonspecific abnormal finding of lung field: Secondary | ICD-10-CM | POA: Diagnosis not present

## 2022-09-20 DIAGNOSIS — J180 Bronchopneumonia, unspecified organism: Secondary | ICD-10-CM | POA: Diagnosis not present

## 2022-09-22 ENCOUNTER — Other Ambulatory Visit (HOSPITAL_COMMUNITY): Payer: Medicare PPO

## 2022-09-27 ENCOUNTER — Telehealth: Payer: Self-pay | Admitting: Pulmonary Disease

## 2022-09-27 NOTE — Telephone Encounter (Signed)
I spoke with the patient and let her know the official report is not back yet. She wants to know if you can look at the images and tell her anything. She does have an appt with you on Friday but she does not want to come in without knowing something.

## 2022-09-27 NOTE — Telephone Encounter (Signed)
Patient wants to know the results of her CT Scan. Please call and advise [phone]

## 2022-09-28 NOTE — Telephone Encounter (Signed)
Patient aware of results and recommendations. °

## 2022-09-28 NOTE — Telephone Encounter (Signed)
Please call regarding the following:  We have requested for radiology to have a finalized read today before her appointment. Based on the results I still encourage an appointment tomorrow.   Please communicate that her results are overall stable but still require discussion and monitoring. It is available in mychart if she wishes to read it. But this can be further discussed in our appointment.

## 2022-09-29 ENCOUNTER — Encounter (HOSPITAL_BASED_OUTPATIENT_CLINIC_OR_DEPARTMENT_OTHER): Payer: Self-pay | Admitting: Pulmonary Disease

## 2022-09-29 ENCOUNTER — Ambulatory Visit (HOSPITAL_BASED_OUTPATIENT_CLINIC_OR_DEPARTMENT_OTHER): Payer: Medicare PPO | Admitting: Pulmonary Disease

## 2022-09-29 VITALS — BP 140/78 | HR 68 | Resp 18 | Ht 67.0 in | Wt 122.6 lb

## 2022-09-29 DIAGNOSIS — J4489 Other specified chronic obstructive pulmonary disease: Secondary | ICD-10-CM

## 2022-09-29 DIAGNOSIS — R918 Other nonspecific abnormal finding of lung field: Secondary | ICD-10-CM | POA: Diagnosis not present

## 2022-09-29 MED ORDER — LEVALBUTEROL TARTRATE 45 MCG/ACT IN AERO: 1.0000 | INHALATION_SPRAY | Freq: Four times a day (QID) | RESPIRATORY_TRACT | 2 refills | Status: DC | PRN

## 2022-09-29 NOTE — Progress Notes (Signed)
Subjective:   PATIENT ID: Tina Lucas GENDER: female DOB: [birth year], MRN: [id number]   HPI  Chief Complaint  Patient presents with   Follow-up    FU after CT scan    Reason for Visit: Follow-up  [name] is a 72 year old female never smoker with childhood asthma (shots, nebulizers and inhalers) who presents for follow-up.  Synopsis Established care with [facility] in 2020. Previously seen by pulmonologist (Dr. Iverson Alamin) for asthma until early adulthood.  She was advised to stop Advair at age 46 because she "outgrew" asthma and required minimal treatment.  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  Followed for COPD/asthma and pulmonary nodules.  2020 PFT with obstructive defect. Started on high dose Dulera 2021 No exacerbations. No changes in meds. Waxing and waning pulmonary nodules monitored 2022 SOB worsened when decreasing BID PPI 2023 Remains on Dulera and Xopenex daily. Remains active in pickleball.  2024 Changed from [facility] to Symbicort due to insurance. Pneumonia in March  09/29/22 Since our last visit she is compliant with Symbicort with TWO puffs twice a day. Uses Xopenex prior to exercise 6-7 times a week. Continues to play pickleball 3x a week and walking 2 days a week for over an hour (4-5 miles around [facility]). Denies shortness of breath except with heavy exertion. Doesn't need rescue with walking. Denies wheezing or coughing.   Prior inhalers: Elwin Sleight stopped due to non-formulary on insurance  Asthma Control Test ACT Total Score  04/13/2022  9:23 AM 22  02/16/2020  9:29 AM 12  12/15/2019  9:47 AM 20   Social History: Never smoker Retired Runner, broadcasting/film/video MS degree in exercise physiology No exposures to COVID-19.  She lives in [place] near her grandchildren  Environmental exposures:  Significant second-hand (father) smoke exposure and noticeably improved after moving for college.   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 1 each 11   Calcium Carb-Cholecalciferol (CALTRATE 600+D3 PO) Take by mouth.     celecoxib (CELEBREX) 100 MG capsule Take 1 capsule (100 mg total) by mouth daily. 90 capsule 3   D 1000 25 MCG (1000 UT) capsule SMARTSIG:1 By Mouth     Digestive Enzymes (MULTI-ENZYME PO) Take by mouth daily before breakfast. Patient takes Pure Encapsulations Digestive Enzymes Ultra     fexofenadine (ALLEGRA) 180 MG tablet Take 180 mg by mouth daily.     fluocinonide cream (LIDEX) 0.05 % Apply 1 application topically 2 (two) times daily as needed (FOR PSORIASIS).      hydrocortisone 2.5 % cream      levothyroxine (SYNTHROID) 50 MCG tablet Take 1 tablet (50 mcg total) by mouth daily with breakfast. 90 tablet 3  Magnesium 250 MG TABS Take 125 mg by mouth daily.     Melatonin 5 MG CHEW Chew by mouth at bedtime.     Multiple Vitamins-Minerals (ONE-A-DAY WOMENS 50+ ADVANTAGE PO) Take by mouth daily.     Probiotic Product (PROBIOTIC PO) Take by mouth daily before breakfast. Patient takes Shelbie Ammons Probiotic Eleven (one at breakfast)     sertraline (ZOLOFT) 25 MG tablet Take 0.5 tablets (12.5 mg total) by mouth daily. 45 tablet 3   triamcinolone (KENALOG) 0.1 % paste       levalbuterol (XOPENEX HFA) 45 MCG/ACT inhaler INHALE 1 TO 2 PUFFS BY MOUTH EVERY 4 HOURS AS NEEDED FOR WHEEZING 15 g 2   neomycin-polymyxin-hydrocortisone (CORTISPORIN) 3.5-10000-1 OTIC suspension Apply 1-2 drops daily after soaking and cover with bandaid 10 mL 0   No facility-administered medications prior to visit.    Review of Systems  Constitutional:  Negative for chills, diaphoresis, fever, malaise/fatigue and weight loss.  HENT:  Negative for congestion.   Respiratory:  Negative for cough, hemoptysis, sputum production, shortness of breath and wheezing.   Cardiovascular:  Negative for chest pain, palpitations and leg swelling.    Objective:   Vitals:   09/29/22 0910 09/29/22 0915  BP: (!) 170/80 (!) 164/78  Pulse: 68   Resp: 18   SpO2: 99%   Weight: 122 lb 9.6 oz (55.6 kg)   Height: 5\' 7"  (1.702 m)     SpO2: 99 %  Physical Exam: General: Well-appearing, no acute distress HENT: [place], AT Eyes: EOMI, no scleral icterus Respiratory: Clear to auscultation bilaterally.  No crackles, wheezing or rales Cardiovascular: RRR, -M/R/G, no JVD Extremities:-Edema,-tenderness Neuro: AAO x4, CNII-XII grossly intact Psych: Normal mood, normal affect   Data Reviewed:  Imaging: CT Chest 08/01/18- RUL GGO nodule measured 6mm and solid nodule measured 5mm CT Chest 01/30/19 - RUL nodule increased to 9mm. Scattered lung nodules <73mm in left and right upper lobes. CT Chest 02/16/19 - Unchanged prior lung nodules. Interval solid 5mm RUL and multifocal ground glass nodular opacities. CT Chest 01/30/20 - Interval development of solid 5mm nodule in RUL with adjacent smaller nodes and ground glass nodule in RUL 11x27mm. Remaininig nodules unchanged. CT Chest 08/04/20 - New 7 mm nodule in the anterior right lung apex with mild subpleural retraction. Previously seen 5 and 11 mm nodules in the right upper lobe have resolved. New tiny clustered centrilobular nodules in the central right lower lobe,  nonspecific resolved CT Chest 11/15/20 - Interval RLL subsolid nodule ~65mm. Similar/slightly decreased lesions in RUL and LUL CT chest 02/15/2021- Resolved right lower lobe nodule, no longer present.  Stable right upper lobe nodules ~18mm CT Chest  04/05/22  Interval development of multifocal peribronchial nodular ground-glass pulmonary infiltrates, most severe within the right uppe//r lobe, likely infectious or inflammatory in the acute setting. Hyperinflation CT Chest 09/20/22 - Resolved RUL bronchopneumonia. Mild cylindrical bronchiectasis with micronodules and ground glass that are stable. No new nodules, remaining stable  PFT: 08/27/2018 FVC 2.78 (78 %) FEV1 1.76 (65 %) Ratio 63 TLC 117 % RV 175% RV/TLC 144% DLCO 107 % Interpretation: Moderate obstructive defect with air trapping.  No significant bronchodilator response.  Normal TLC.  Normal DLCO  Labs: Since 2015 absolute eos range from 400-600. Absolute eos  08/06/18 - 500 03/31/19 - 200 04/06/21 - 400 04/13/22 - 400    Assessment & Plan:   Discussion: 72 year old female never smoker with history of childhood asthma with moderate eosinophilic asthma and pulmonary  nodules for follow-up. Reviewed CT with resolved areas of infiltrate in RUL and stable micronodules and no new pulmonary nodules, overall stable. Likely inflammatory/infectious. Well controlled asthma symptoms on current inhaler regimen. Discussed clinical course and management of COPD/asthma including bronchodilator regimen, preventive care including vaccinations and action plan for exacerbation.  Moderate persistent COPD asthma overlap- well-controlled --CONTINUE Symbicort 160-4.5 mcg TWO puffs TWICE a day --CONTINUE Xopenex inhaler as needed for chest tightness. OK to take before activity. REFILLED --CONTINUE Protonix --Counseled on vaccines including on RSV and covid. Planning on flu and repeat new Covid vaccine later this year   Multiple subsolid lung nodules - stable  micronodules Waxing and waning pulmonary nodules since [address]109.  Last CT on 08/2022 reviewed as noted above --Plan on CT in 1 year. Order at next visit  White coat hypertension --Reviewed home SBP 110s  Health Maintenance Immunization History  Administered Date(s) Administered   COVID-19, mRNA, vaccine(Comirnaty)12 years and older 10/13/2021   Fluad Quad(high Dose 65+) 10/09/2018, 10/28/2020, 10/28/2021   Influenza Split 10/30/2013   Influenza, High Dose Seasonal PF 10/25/2017, 10/17/2019, 10/28/2021   Influenza-Unspecified 10/19/2014, 10/19/2015, 10/27/2016   PFIZER(Purple Top)SARS-COV-2 Vaccination 02/12/2019, 03/05/2019, 10/30/2019, 06/17/2020, 11/23/2020   Pfizer Covid Bivalent Pediatric Vaccine(53mos to <41yrs) 08/07/2022   Pfizer Covid-19 Vaccine Bivalent Booster 88yrs & up 11/23/2020, 06/17/2021, 10/13/2021   Pneumococcal Conjugate-13 11/28/2013   Pneumococcal Polysaccharide-23 03/29/2018   Respiratory Syncytial Virus Vaccine,Recomb Aduvanted(Arexvy) 01/02/2022   Zoster, Live 02/18/2009   No orders of the defined types were placed in this encounter.   Meds ordered this encounter  Medications   levalbuterol (XOPENEX HFA) 45 MCG/ACT inhaler    Sig: Inhale 1-2 puffs into the lungs every 6 (six) hours as needed for wheezing or shortness of breath.    Dispense:  15 g    Refill:  2    Return in about 6 months (around 03/29/2023).  I have spent a total time of 31-minutes on the day of the appointment including chart review, data review, collecting history, coordinating care and discussing medical diagnosis and plan with the patient/family. Past medical history, allergies, medications were reviewed. Pertinent imaging, labs and tests included in this note have been reviewed and interpreted independently by me.  Yousef Huge Mechele Collin, MD [facility] 09/29/2022 9:52 AM  Office Number [phone]

## 2022-09-29 NOTE — Patient Instructions (Signed)
Moderate persistent COPD asthma overlap- well-controlled --CONTINUE Symbicort 160-4.5 mcg TWO puffs TWICE a day --CONTINUE Xopenex inhaler as needed for chest tightness. OK to take before activity. REFILLED --CONTINUE Protonix --Counseled on vaccines including on RSV and covid. Planning on flu and repeat new Covid vaccine later this year   Multiple subsolid lung nodules - stable micronodules Waxing and waning pulmonary nodules.  Last CT on 08/2022 reviewed as noted above --Plan on CT in 1 year. Order at next visit  White coat hypertension --Reviewed home SBP 110s

## 2022-10-10 ENCOUNTER — Encounter (HOSPITAL_BASED_OUTPATIENT_CLINIC_OR_DEPARTMENT_OTHER): Payer: Self-pay | Admitting: Pulmonary Disease

## 2022-11-01 ENCOUNTER — Ambulatory Visit: Payer: Medicare PPO | Admitting: Podiatry

## 2022-11-01 DIAGNOSIS — M79674 Pain in right toe(s): Secondary | ICD-10-CM

## 2022-11-01 DIAGNOSIS — B351 Tinea unguium: Secondary | ICD-10-CM

## 2022-11-01 DIAGNOSIS — M79675 Pain in left toe(s): Secondary | ICD-10-CM

## 2022-11-05 ENCOUNTER — Encounter: Payer: Self-pay | Admitting: Podiatry

## 2022-11-05 NOTE — Progress Notes (Signed)
  Subjective:  Patient ID: Tina Lucas, female    DOB: [birth year],  MRN: [id number]  Tina Lucas presents to clinic today for painful elongated mycotic toenails bilaterally which are tender when wearing enclosed shoe gear. Pain is relieved with periodic professional debridement.  Chief Complaint  Patient presents with   RFC    RFC   New problem(s): Painful lesion plantar aspect of right foot with weightbearing.   PCP is Pincus Sanes, MD.  Allergies  Allergen Reactions   Singulair [Montelukast Sodium] Other (See Comments)    headaches   Review of Systems: Negative except as noted in the HPI.  Objective:  There were no vitals filed for this visit. Tina Lucas is a pleasant 72 y.o. female WD, WN in NAD. AAO x 3.  Vascular Examination: Capillary refill time <3 seconds b/l LE. Palpable pedal pulses b/l LE. Digital hair present b/l. No pedal edema b/l. Skin temperature gradient WNL b/l. No varicosities b/l. Marland Kitchen  Dermatological Examination: Pedal skin with normal turgor, texture and tone b/l. No open wounds. No interdigital macerations b/l.   Toenails bilateral b/l 3rd, 4th, 5th digits  thickened, discolored, dystrophic with subungual debris. There is pain on palpation to dorsal aspect of nailplates.   Bilateral great toenails mycotic and shortened.  Anonychia noted b/l 2nd digits. Nailbed(s) epithelialized.   Porokeratotic lesion submet head 5 right foot with tenderness to palpation. No erythema, no edema, no drainage, no fluctuance.  Assessment/Plan: 1. Pain due to onychomycosis of toenails of both feet    -Consent given for treatment as described below: -Examined patient. -Will submit preauthorization to insurance company for paring of painful porokeratotic lesion. -Patient to continue soft, supportive shoe gear daily. -Toenails bilateral 3rd toes, bilateral 4th toes, and bilateral 5th toes debrided in length and girth without iatrogenic bleeding with sterile nail nipper  and dremel. Bilateral great toenails not debrided today. -As a courtesy, porokeratotic lesion(s) submet head 5 right foot pared and enucleated without complication or incident. Total number pared=1. -Patient/POA to call should there be question/concern in the interim.   Return in about 3 months (around 02/01/2023).  Freddie Breech, DPM

## 2022-11-22 DIAGNOSIS — D2271 Melanocytic nevi of right lower limb, including hip: Secondary | ICD-10-CM | POA: Diagnosis not present

## 2022-11-22 DIAGNOSIS — D692 Other nonthrombocytopenic purpura: Secondary | ICD-10-CM | POA: Diagnosis not present

## 2022-11-22 DIAGNOSIS — L570 Actinic keratosis: Secondary | ICD-10-CM | POA: Diagnosis not present

## 2022-11-22 DIAGNOSIS — D1721 Benign lipomatous neoplasm of skin and subcutaneous tissue of right arm: Secondary | ICD-10-CM | POA: Diagnosis not present

## 2022-11-22 DIAGNOSIS — L309 Dermatitis, unspecified: Secondary | ICD-10-CM | POA: Diagnosis not present

## 2022-11-22 DIAGNOSIS — L2089 Other atopic dermatitis: Secondary | ICD-10-CM | POA: Diagnosis not present

## 2022-11-22 DIAGNOSIS — L718 Other rosacea: Secondary | ICD-10-CM | POA: Diagnosis not present

## 2022-11-22 DIAGNOSIS — I788 Other diseases of capillaries: Secondary | ICD-10-CM | POA: Diagnosis not present

## 2022-12-27 DIAGNOSIS — H26491 Other secondary cataract, right eye: Secondary | ICD-10-CM | POA: Diagnosis not present

## 2022-12-27 DIAGNOSIS — H04123 Dry eye syndrome of bilateral lacrimal glands: Secondary | ICD-10-CM | POA: Diagnosis not present

## 2022-12-27 DIAGNOSIS — Z961 Presence of intraocular lens: Secondary | ICD-10-CM | POA: Diagnosis not present

## 2023-02-14 ENCOUNTER — Encounter: Payer: Self-pay | Admitting: Podiatry

## 2023-02-14 ENCOUNTER — Ambulatory Visit: Payer: Medicare PPO | Admitting: Podiatry

## 2023-02-14 DIAGNOSIS — B351 Tinea unguium: Secondary | ICD-10-CM

## 2023-02-14 DIAGNOSIS — M79674 Pain in right toe(s): Secondary | ICD-10-CM | POA: Diagnosis not present

## 2023-02-14 DIAGNOSIS — M79675 Pain in left toe(s): Secondary | ICD-10-CM | POA: Diagnosis not present

## 2023-02-20 NOTE — Progress Notes (Signed)
  Subjective:  Patient ID: Tina Lucas, female    DOB: [birth year],  MRN: [id number]  73 y.o. female presents to clinic with  painful mycotic toenails of both feet that are difficult to trim. Pain interferes with daily activities and wearing enclosed shoe gear comfortably.  Chief Complaint  Patient presents with   RFC    RFC     New problem(s): None   PCP is Burns, Bobette Mo, MD.  Allergies  Allergen Reactions   Singulair [Montelukast Sodium] Other (See Comments)    headaches    Review of Systems: Negative except as noted in the HPI.   Objective:  ISABELLAROSE Lucas is a pleasant 73 y.o. female WD, WN in NAD. AAO x 3.  Vascular Examination: Vascular status intact b/l with palpable pedal pulses. CFT immediate b/l. No edema. No pain with calf compression b/l. Skin temperature gradient WNL b/l. No cyanosis or clubbing noted b/l LE.  Neurological Examination: Sensation grossly intact b/l with 10 gram monofilament.   Dermatological Examination: Pedal skin with normal turgor, texture and tone b/l. Toenails 2-5 b/l thick, discolored, elongated with subungual debris and pain on dorsal palpation. No hyperkeratotic lesions noted b/l. Anonychia noted bilateral 2nd toes. Nailbed(s) epithelialized.   Musculoskeletal Examination: Muscle strength 5/5 to b/l LE. HAV with bunion deformity noted b/l LE.  Radiographs: None  Assessment:   1. Pain due to onychomycosis of toenails of both feet    Plan:  Patient was evaluated and treated. All patient's and/or POA's questions/concerns addressed on today's visit. Toenails bilateral great toes and 3-5 bilaterally debrided in length and girth without incident. Continue soft, supportive shoe gear daily. Report any pedal injuries to medical professional. Call office if there are any questions/concerns. -Patient/POA to call should there be question/concern in the interim.  Return in about 3 months (around 05/15/2023).  Freddie Breech, DPM       [place] LOCATION: [address], [place] [address]                   Office [phone]   [facility] LOCATION: [address] [place], [place] [address] Office [phone]

## 2023-03-12 ENCOUNTER — Other Ambulatory Visit: Payer: Self-pay | Admitting: Internal Medicine

## 2023-04-03 ENCOUNTER — Other Ambulatory Visit: Payer: Self-pay | Admitting: Internal Medicine

## 2023-04-05 ENCOUNTER — Other Ambulatory Visit (HOSPITAL_BASED_OUTPATIENT_CLINIC_OR_DEPARTMENT_OTHER): Payer: Self-pay | Admitting: Pulmonary Disease

## 2023-04-17 ENCOUNTER — Encounter: Payer: Self-pay | Admitting: Internal Medicine

## 2023-04-17 NOTE — Progress Notes (Unsigned)
 Subjective:    Patient ID: Tina Lucas, female    DOB: June 23, 1950, 73 y.o.   MRN: [id number]      HPI Tina Lucas is here for a Physical exam and her chronic medical problems.   BP well controlled at home - she brought her log.     Overall doing well.  Still very active.  Does have a little right shoulder/scapular pain but is still able to do all her activities including weightlifting and pickleball.  It does get irritated at times, but does not last.  She also notes it hurting more when she picks up her granddaughter   Medications and allergies reviewed with patient and updated if appropriate.  Current Outpatient Medications on File Prior to Visit  Medication Sig Dispense Refill   metroNIDAZOLE (METROCREAM) 0.75 % cream Apply 1 Application topically 2 (two) times daily.     budesonide-formoterol (SYMBICORT) 160-4.5 MCG/ACT inhaler Inhale 2 puffs into the lungs in the morning and at bedtime. 1 each 11   Calcium Carb-Cholecalciferol (CALTRATE 600+D3 PO) Take by mouth.     celecoxib (CELEBREX) 100 MG capsule TAKE 1 CAPSULE BY MOUTH DAILY 90 capsule 3   D 1000 25 MCG (1000 UT) capsule SMARTSIG:1 By Mouth     Digestive Enzymes (MULTI-ENZYME PO) Take by mouth daily before breakfast. Patient takes Pure Encapsulations Digestive Enzymes Ultra     fexofenadine (ALLEGRA) 180 MG tablet Take 180 mg by mouth daily.     fluocinonide cream (LIDEX) 0.05 % Apply 1 application topically 2 (two) times daily as needed (FOR PSORIASIS).      hydrocortisone 2.5 % cream      levalbuterol (XOPENEX HFA) 45 MCG/ACT inhaler INHALE ONE TO TWO PUFFS BY MOUTH EVERY 4 HOURS AS NEEDED FOR WHEEZING 15 g 2   levothyroxine (SYNTHROID) 50 MCG tablet Take 1 tablet (50 mcg total) by mouth daily with breakfast. 90 tablet 3   Magnesium 250 MG TABS Take 125 mg by mouth daily.     Melatonin 5 MG CHEW Chew by mouth at bedtime.     Multiple Vitamins-Minerals (ONE-A-DAY WOMENS 50+ ADVANTAGE PO) Take by mouth daily.      Probiotic Product (PROBIOTIC PO) Take by mouth daily before breakfast. Patient takes Shelbie Ammons Probiotic Eleven (one at breakfast)     sertraline (ZOLOFT) 25 MG tablet TAKE 1/2 TABLET BY MOUTH DAILY 45 tablet 3   triamcinolone (KENALOG) 0.1 % paste      No current facility-administered medications on file prior to visit.    Review of Systems  Constitutional:  Negative for fever.  Eyes:  Negative for visual disturbance.  Respiratory:  Positive for shortness of breath (controlled with inhalers). Negative for cough and wheezing.   Cardiovascular:  Negative for chest pain, palpitations and leg swelling.  Gastrointestinal:  Negative for abdominal pain, blood in stool, constipation and diarrhea.       No gerd  Genitourinary:  Negative for dysuria.  Musculoskeletal:  Positive for arthralgias (mild achiness). Negative for back pain.       Right shoulder/scapular region mild pain  Skin:  Positive for rash (chronic - intermittent - sees derm).  Neurological:  Negative for light-headedness and headaches.  Hematological:  Bruises/bleeds easily (related to celebrex).  Psychiatric/Behavioral:  Negative for dysphoric mood. The patient is not nervous/anxious.        Objective:   Vitals:   04/18/23 0810 04/18/23 0828  BP: (!) 140/64 (!) 144/72  Pulse: 71   SpO2: 96%  Filed Weights   04/18/23 0810  Weight: 117 lb (53.1 kg)   Body mass index is 18.32 kg/m.  BP Readings from Last 3 Encounters:  04/18/23 (!) 144/72  09/29/22 (!) 140/78  06/14/22 128/80    Wt Readings from Last 3 Encounters:  04/18/23 117 lb (53.1 kg)  09/29/22 122 lb 9.6 oz (55.6 kg)  06/14/22 122 lb 9.6 oz (55.6 kg)       Physical Exam Constitutional: She appears well-developed and well-nourished. No distress.  HENT:  Head: Normocephalic and atraumatic.  Right Ear: External ear normal. Normal ear canal and TM Left Ear: External ear normal.  Normal ear canal and TM Mouth/Throat: Oropharynx is clear and  moist.  Eyes: Conjunctivae normal.  Neck: Neck supple. No tracheal deviation present. No thyromegaly present.  No carotid bruit  Cardiovascular: Normal rate, regular rhythm and normal heart sounds.   No murmur heard.  No edema. Pulmonary/Chest: Effort normal and breath sounds normal. No respiratory distress. She has no wheezes. She has no rales.  Breast: deferred   Abdominal: Soft. She exhibits no distension. There is no tenderness.  Lymphadenopathy: She has no cervical adenopathy.  Msk: right shoulder, scapular area w/o deformity or swelling - no tenderness with palpation, tightness in upper back trapezius muscle, pain localized to [facility] joint region - likely OA Skin: Skin is warm and dry. She is not diaphoretic.  Psychiatric: She has a normal mood and affect. Her behavior is normal.     Lab Results  Component Value Date   WBC 6.1 06/14/2022   HGB 14.2 06/14/2022   HCT 42.8 06/14/2022   PLT 256.0 06/14/2022   GLUCOSE 97 06/14/2022   CHOL 213 (H) 04/12/2022   TRIG 46.0 04/12/2022   HDL 103.20 04/12/2022   LDLCALC 100 (H) 04/12/2022   ALT 27 06/14/2022   AST 29 06/14/2022   NA 134 (L) 06/14/2022   K 3.7 06/14/2022   CL 97 06/14/2022   CREATININE 0.66 06/14/2022   BUN 11 06/14/2022   CO2 29 06/14/2022   TSH 2.70 06/14/2022   INR 1.0 12/15/2014   HGBA1C 5.1 03/07/2017         Assessment & Plan:   Physical exam: Screening blood work  ordered Exercise  regular - pickleball three times per week, weights three times per week, walking 3-4 miles 3-4/week Weight  normal Substance abuse  none Sees derm annually  Reviewed recommended immunizations.   Health Maintenance  Topic Date Due   Medicare Annual Wellness (AWV)  03/31/2022   DEXA SCAN  04/14/2023   DTaP/Tdap/Td (3 - Tdap) 04/17/2024 (Originally 07/[birth year]/1968)   COVID-19 Vaccine (12 - 2024-25 season) 05/23/2023   MAMMOGRAM  06/13/2024   Colonoscopy  06/25/2028   Pneumonia Vaccine 61+ Years old  Completed   INFLUENZA  VACCINE  Completed   Hepatitis C Screening  Completed   HPV VACCINES  Aged Out   Fecal DNA (Cologuard)  Discontinued   Zoster Vaccines- Shingrix  Discontinued          See Problem List for Assessment and Plan of chronic medical problems.

## 2023-04-17 NOTE — Patient Instructions (Addendum)
 Blood work was ordered.       Medications changes include :   None    A bone density scan was ordered for [facility] and someone will call you to schedule an appointment.     Return in about 1 year (around 04/17/2024) for Physical Exam.   Health Maintenance, Female Adopting a healthy lifestyle and getting preventive care are important in promoting health and wellness. Ask your health care provider about: The right schedule for you to have regular tests and exams. Things you can do on your own to prevent diseases and keep yourself healthy. What should I know about diet, weight, and exercise? Eat a healthy diet  Eat a diet that includes plenty of vegetables, fruits, low-fat dairy products, and lean protein. Do not eat a lot of foods that are high in solid fats, added sugars, or sodium. Maintain a healthy weight Body mass index (BMI) is used to identify weight problems. It estimates body fat based on height and weight. Your health care provider can help determine your BMI and help you achieve or maintain a healthy weight. Get regular exercise Get regular exercise. This is one of the most important things you can do for your health. Most adults should: Exercise for at least 150 minutes each week. The exercise should increase your heart rate and make you sweat (moderate-intensity exercise). Do strengthening exercises at least twice a week. This is in addition to the moderate-intensity exercise. Spend less time sitting. Even light physical activity can be beneficial. Watch cholesterol and blood lipids Have your blood tested for lipids and cholesterol at 74 years of age, then have this test every 5 years. Have your cholesterol levels checked more often if: Your lipid or cholesterol levels are high. You are older than 74 years of age. You are at high risk for heart disease. What should I know about cancer screening? Depending on your health history and family history,  you may need to have cancer screening at various ages. This may include screening for: Breast cancer. Cervical cancer. Colorectal cancer. Skin cancer. Lung cancer. What should I know about heart disease, diabetes, and high blood pressure? Blood pressure and heart disease High blood pressure causes heart disease and increases the risk of stroke. This is more likely to develop in people who have high blood pressure readings or are overweight. Have your blood pressure checked: Every 3-5 years if you are 36-83 years of age. Every year if you are 62 years old or older. Diabetes Have regular diabetes screenings. This checks your fasting blood sugar level. Have the screening done: Once every three years after age 55 if you are at a normal weight and have a low risk for diabetes. More often and at a younger age if you are overweight or have a high risk for diabetes. What should I know about preventing infection? Hepatitis B If you have a higher risk for hepatitis B, you should be screened for this virus. Talk with your health care provider to find out if you are at risk for hepatitis B infection. Hepatitis C Testing is recommended for: Everyone born from [birth year] through 1966. Anyone with known risk factors for hepatitis C. Sexually transmitted infections (STIs) Get screened for STIs, including gonorrhea and chlamydia, if: You are sexually active and are younger than 74 years of age. You are older than 73 years of age and your health care provider tells you that you are at risk for this type of  infection. Your sexual activity has changed since you were last screened, and you are at increased risk for chlamydia or gonorrhea. Ask your health care provider if you are at risk. Ask your health care provider about whether you are at high risk for HIV. Your health care provider may recommend a prescription medicine to help prevent HIV infection. If you choose to take medicine to prevent HIV, you should  first get tested for HIV. You should then be tested every 3 months for as long as you are taking the medicine. Pregnancy If you are about to stop having your period (premenopausal) and you may become pregnant, seek counseling before you get pregnant. Take 400 to 800 micrograms (mcg) of folic acid every day if you become pregnant. Ask for birth control (contraception) if you want to prevent pregnancy. Osteoporosis and menopause Osteoporosis is a disease in which the bones lose minerals and strength with aging. This can result in bone fractures. If you are 87 years old or older, or if you are at risk for osteoporosis and fractures, ask your health care provider if you should: Be screened for bone loss. Take a calcium or vitamin D supplement to lower your risk of fractures. Be given hormone replacement therapy (HRT) to treat symptoms of menopause. Follow these instructions at home: Alcohol use Do not drink alcohol if: Your health care provider tells you not to drink. You are pregnant, may be pregnant, or are planning to become pregnant. If you drink alcohol: Limit how much you have to: 0-1 drink a day. Know how much alcohol is in your drink. In the [place] one drink equals one 12 oz bottle of beer (355 mL), one 5 oz glass of wine (148 mL), or one 1 oz glass of hard liquor (44 mL). Lifestyle Do not use any products that contain nicotine or tobacco. These products include cigarettes, chewing tobacco, and vaping devices, such as e-cigarettes. If you need help quitting, ask your health care provider. Do not use street drugs. Do not share needles. Ask your health care provider for help if you need support or information about quitting drugs. General instructions Schedule regular health, dental, and eye exams. Stay current with your vaccines. Tell your health care provider if: You often feel depressed. You have ever been abused or do not feel safe at home. Summary Adopting a healthy lifestyle  and getting preventive care are important in promoting health and wellness. Follow your health care provider's instructions about healthy diet, exercising, and getting tested or screened for diseases. Follow your health care provider's instructions on monitoring your cholesterol and blood pressure. This information is not intended to replace advice given to you by your health care provider. Make sure you discuss any questions you have with your health care provider. Document Revised: 05/31/2020 Document Reviewed: 05/31/2020 Elsevier Patient Education  2024 [employer].

## 2023-04-18 ENCOUNTER — Ambulatory Visit (INDEPENDENT_AMBULATORY_CARE_PROVIDER_SITE_OTHER): Payer: Medicare PPO | Admitting: Internal Medicine

## 2023-04-18 VITALS — BP 144/72 | HR 71 | Ht 67.0 in | Wt 117.0 lb

## 2023-04-18 DIAGNOSIS — F419 Anxiety disorder, unspecified: Secondary | ICD-10-CM | POA: Diagnosis not present

## 2023-04-18 DIAGNOSIS — Z0001 Encounter for general adult medical examination with abnormal findings: Secondary | ICD-10-CM

## 2023-04-18 DIAGNOSIS — E039 Hypothyroidism, unspecified: Secondary | ICD-10-CM

## 2023-04-18 DIAGNOSIS — J4489 Other specified chronic obstructive pulmonary disease: Secondary | ICD-10-CM | POA: Diagnosis not present

## 2023-04-18 DIAGNOSIS — M25511 Pain in right shoulder: Secondary | ICD-10-CM | POA: Diagnosis not present

## 2023-04-18 DIAGNOSIS — M85859 Other specified disorders of bone density and structure, unspecified thigh: Secondary | ICD-10-CM

## 2023-04-18 DIAGNOSIS — Z0000 Encounter for general adult medical examination without abnormal findings: Secondary | ICD-10-CM

## 2023-04-18 LAB — CBC WITH DIFFERENTIAL/PLATELET
Basophils Absolute: 0.1 10*3/uL (ref 0.0–0.1)
Basophils Relative: 1.2 % (ref 0.0–3.0)
Eosinophils Absolute: 0.4 10*3/uL (ref 0.0–0.7)
Eosinophils Relative: 7.9 % — ABNORMAL HIGH (ref 0.0–5.0)
HCT: 43.9 % (ref 36.0–46.0)
Hemoglobin: 15 g/dL (ref 12.0–15.0)
Lymphocytes Relative: 16.1 % (ref 12.0–46.0)
Lymphs Abs: 0.8 10*3/uL (ref 0.7–4.0)
MCHC: 34.1 g/dL (ref 30.0–36.0)
MCV: 93.4 fl (ref 78.0–100.0)
Monocytes Absolute: 0.5 10*3/uL (ref 0.1–1.0)
Monocytes Relative: 10.6 % (ref 3.0–12.0)
Neutro Abs: 3.3 10*3/uL (ref 1.4–7.7)
Neutrophils Relative %: 64.2 % (ref 43.0–77.0)
Platelets: 280 10*3/uL (ref 150.0–400.0)
RBC: 4.7 Mil/uL (ref 3.87–5.11)
RDW: 13.2 % (ref 11.5–15.5)
WBC: 5.2 10*3/uL (ref 4.0–10.5)

## 2023-04-18 LAB — LIPID PANEL
Cholesterol: 207 mg/dL — ABNORMAL HIGH (ref 0–200)
HDL: 94.4 mg/dL (ref >39.00)
LDL Cholesterol: 105 mg/dL — ABNORMAL HIGH (ref 0–99)
NonHDL: 112.6
Total CHOL/HDL Ratio: 2
Triglycerides: 38 mg/dL (ref 0.0–149.0)
VLDL: 7.6 mg/dL (ref 0.0–40.0)

## 2023-04-18 LAB — COMPREHENSIVE METABOLIC PANEL WITH GFR
ALT: 25 U/L (ref 0–35)
AST: 25 U/L (ref 0–37)
Albumin: 4.7 g/dL (ref 3.5–5.2)
Alkaline Phosphatase: 63 U/L (ref 39–117)
BUN: 9 mg/dL (ref 6–23)
CO2: 30 meq/L (ref 19–32)
Calcium: 9.5 mg/dL (ref 8.4–10.5)
Chloride: 101 meq/L (ref 96–112)
Creatinine, Ser: 0.62 mg/dL (ref 0.40–1.20)
GFR: 88.13 mL/min (ref >60.00)
Glucose, Bld: 98 mg/dL (ref 70–99)
Potassium: 3.8 meq/L (ref 3.5–5.1)
Sodium: 138 meq/L (ref 135–145)
Total Bilirubin: 0.7 mg/dL (ref 0.2–1.2)
Total Protein: 7.1 g/dL (ref 6.0–8.3)

## 2023-04-18 NOTE — Assessment & Plan Note (Signed)
 Acute Having right shoulder pain Tightness in her right trapezius/upper back Pain localized to top of shoulder-possible some AC joint inflammation or arthritis versus internal shoulder strain or injury Continue conservative treatment Avoid overdoing it with all of her activities If symptoms not improving or worsening can refer to sports medicine or orthopedics

## 2023-04-18 NOTE — Assessment & Plan Note (Addendum)
 Chronic DEXA due-ordered exercises regularly Taking calcium 600 mg daily, tums 750 mg at night and vitamin d Ck vitamin d level

## 2023-04-18 NOTE — Assessment & Plan Note (Addendum)
 Chronic Following with pulmonology Stable Symbicort 160-4.5 twice daily prn ( uses daily), Xopenex as needed

## 2023-04-18 NOTE — Assessment & Plan Note (Signed)
 Chronic Will check TSH today Will adjust medication if necessary Continue levothyroxine 50 mcg daily

## 2023-04-18 NOTE — Assessment & Plan Note (Addendum)
 Chronic Currently taking sertraline 12.5 mg daily Will continue-can try to decrease it anytime if she wishes

## 2023-04-19 ENCOUNTER — Encounter: Payer: Self-pay | Admitting: Internal Medicine

## 2023-04-19 LAB — VITAMIN D 25 HYDROXY (VIT D DEFICIENCY, FRACTURES): VITD: 57.28 ng/mL (ref 30.00–100.00)

## 2023-04-19 LAB — TSH: TSH: 2.29 u[IU]/mL (ref 0.35–5.50)

## 2023-04-24 ENCOUNTER — Other Ambulatory Visit: Payer: Self-pay

## 2023-04-24 ENCOUNTER — Other Ambulatory Visit (HOSPITAL_BASED_OUTPATIENT_CLINIC_OR_DEPARTMENT_OTHER): Payer: Self-pay | Admitting: Pulmonary Disease

## 2023-04-24 MED ORDER — BUDESONIDE-FORMOTEROL FUMARATE 160-4.5 MCG/ACT IN AERO: 2.0000 | INHALATION_SPRAY | Freq: Two times a day (BID) | RESPIRATORY_TRACT | 6 refills | Status: DC

## 2023-04-24 NOTE — Telephone Encounter (Signed)
 Rx sent to pharmacy

## 2023-04-26 NOTE — Telephone Encounter (Signed)
 Patient is nee

## 2023-04-27 ENCOUNTER — Telehealth: Payer: Self-pay | Admitting: Internal Medicine

## 2023-04-27 NOTE — Telephone Encounter (Signed)
 Copied from CRM [id number]. Topic: Clinical - Medical Advice >> Apr 26, 2023  3:30 PM Tina Lucas wrote: Reason for CRM: patient was ordered a bone density test and a message was left for her but when she called back it was not the correct place. She looked on her Mychart but couldn't tell where it was ordered to, but needs her bone density test ordered at [facility] please. She was also wondering if either Dr. Lawerance Bach or Dr. Lawerance Bach' nurse could give her a call once ordered so she can make sure it is done. She apologizes for having to call and just thinks there was a little mix-up. Please advise. Thank you.  ---  I don't see any dexa appointments, were these orders for another facility?

## 2023-04-30 ENCOUNTER — Encounter (HOSPITAL_BASED_OUTPATIENT_CLINIC_OR_DEPARTMENT_OTHER): Payer: Self-pay | Admitting: Pulmonary Disease

## 2023-04-30 ENCOUNTER — Ambulatory Visit (HOSPITAL_BASED_OUTPATIENT_CLINIC_OR_DEPARTMENT_OTHER): Payer: Medicare PPO | Admitting: Pulmonary Disease

## 2023-04-30 VITALS — BP 128/62 | HR 68 | Ht 67.0 in | Wt 121.1 lb

## 2023-04-30 DIAGNOSIS — R918 Other nonspecific abnormal finding of lung field: Secondary | ICD-10-CM

## 2023-04-30 DIAGNOSIS — J4489 Other specified chronic obstructive pulmonary disease: Secondary | ICD-10-CM

## 2023-04-30 NOTE — Progress Notes (Signed)
 Subjective:   PATIENT ID: Tina Lucas GENDER: female DOB: Mar 15, 1950, MRN: [id number]   HPI  Chief Complaint  Patient presents with   Follow-up    Lung nodules    Reason for Visit: Follow-up  Tina Lucas is a 73 year old female never smoker with childhood asthma (shots, nebulizers and inhalers) who presents for follow-up.  Synopsis Established care with [facility] in 2020. Previously seen by pulmonologist (Dr. Iverson Alamin) for asthma until early adulthood.  She was advised to stop Advair at age 58 because she "outgrew" asthma and required minimal treatment.  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  Followed for COPD/asthma and pulmonary nodules.  2020 PFT with obstructive defect. Started on high dose Dulera 2021 No exacerbations. No changes in meds. Waxing and waning pulmonary nodules monitored 2022 SOB worsened when decreasing BID PPI 2023 Remains on Dulera and Xopenex daily. Remains active in pickleball.  2024 Changed from [facility] to Symbicort due to insurance. Pneumonia in March  09/29/22 Since our last visit she is compliant with Symbicort with TWO puffs twice a day. Uses Xopenex prior to exercise 6-7 times a week. Continues to play pickleball 3x a week and walking 2 days a week for over an hour (4-5 miles around [facility]). Denies shortness of breath except with heavy exertion. Doesn't need rescue with walking. Denies wheezing or coughing.   04/30/23 Since our last visit she has been compliant with her Symbicort. She uses levalbuterol 1-2 puffs 4-7 times a week prior to work-outs. Shortness of breath with high intensity activity. Denies wheezes or cough. Continues to play pickleball and walks daily at least. Will play pickleball on average 2-3 hour sessions. No exacerbations since our last visit.  Prior inhalers: Elwin Sleight stopped due to non-formulary on insurance  Asthma Control Test ACT Total Score  04/30/2023   9:45 AM 24  04/13/2022  9:23 AM 22  02/16/2020  9:29 AM 12   Social History: Never smoker Retired Runner, broadcasting/film/video MS degree in exercise physiology No exposures to COVID-19.  She lives in [place] near her grandchildren  Environmental exposures:  Significant second-hand (father) smoke exposure and noticeably improved after moving for college.   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 1 each 6   Calcium Carb-Cholecalciferol (CALTRATE 600+D3 PO) Take by mouth.     celecoxib (CELEBREX) 100 MG capsule TAKE 1 CAPSULE BY MOUTH DAILY 90 capsule 3   D 1000 25 MCG (1000 UT) capsule SMARTSIG:1 By Mouth     Digestive Enzymes (MULTI-ENZYME PO) Take by mouth daily before breakfast. Patient takes Pure Encapsulations Digestive Enzymes Ultra     fexofenadine (ALLEGRA) 180 MG tablet Take 180 mg by mouth daily.  fluocinonide cream (LIDEX) 0.05 % Apply 1 application topically 2 (two) times daily as needed (FOR PSORIASIS).      hydrocortisone 2.5 % cream      levalbuterol (XOPENEX HFA) 45 MCG/ACT inhaler INHALE ONE TO TWO PUFFS BY MOUTH EVERY 4 HOURS AS NEEDED FOR WHEEZING 15 g 2   levothyroxine (SYNTHROID) 50 MCG tablet Take 1 tablet (50 mcg total) by mouth daily with breakfast. 90 tablet 3   Magnesium 250  MG TABS Take 125 mg by mouth daily.     Melatonin 5 MG CHEW Chew by mouth at bedtime.     metroNIDAZOLE (METROCREAM) 0.75 % cream Apply 1 Application topically 2 (two) times daily.     Multiple Vitamins-Minerals (ONE-A-DAY WOMENS 50+ ADVANTAGE PO) Take by mouth daily.     Probiotic Product (PROBIOTIC PO) Take by mouth daily before breakfast. Patient takes Shelbie Ammons Probiotic Eleven (one at breakfast)     sertraline (ZOLOFT) 25 MG tablet TAKE 1/2 TABLET BY MOUTH DAILY 45 tablet 3   triamcinolone (KENALOG) 0.1 % paste  (Patient not taking: Reported on 04/30/2023)     No facility-administered medications prior to visit.    Review of Systems  Constitutional:  Negative for chills, diaphoresis, fever, malaise/fatigue and weight loss.  HENT:  Negative for congestion.   Respiratory:  Negative for cough, hemoptysis, sputum production, shortness of breath and wheezing.   Cardiovascular:  Negative for chest pain, palpitations and leg swelling.    Objective:   Vitals:   04/30/23 0928  BP: 128/62  Pulse: 68  SpO2: 99%  Weight: 121 lb 1.6 oz (54.9 kg)  Height: 5\' 7"  (1.702 m)    SpO2: 99 %  Physical Exam: General: Well-appearing, no acute distress HENT: [place], AT Eyes: EOMI, no scleral icterus Respiratory: Clear to auscultation bilaterally.  No crackles, wheezing or rales Cardiovascular: RRR, -M/R/G, no JVD Extremities:-Edema,-tenderness Neuro: AAO x4, CNII-XII grossly intact Psych: Normal mood, normal affect  Data Reviewed:  Imaging: CT Chest 08/01/18- RUL GGO nodule measured 6mm and solid nodule measured 5mm CT Chest 01/30/19 - RUL nodule increased to 9mm. Scattered lung nodules <16mm in left and right upper lobes. CT Chest 02/16/19 - Unchanged prior lung nodules. Interval solid 5mm RUL and multifocal ground glass nodular opacities. CT Chest 01/30/20 - Interval development of solid 5mm nodule in RUL with adjacent smaller nodes and ground glass nodule in RUL 11x2mm. Remaininig nodules  unchanged. CT Chest 08/04/20 - New 7 mm nodule in the anterior right lung apex with mild subpleural retraction. Previously seen 5 and 11 mm nodules in the right upper lobe have resolved. New tiny clustered centrilobular nodules in the central right lower lobe, nonspecific resolved CT Chest 11/15/20 - Interval RLL subsolid nodule ~85mm. Similar/slightly decreased lesions in RUL and LUL CT chest 02/15/2021- Resolved right lower lobe nodule, no longer present.  Stable right upper lobe nodules ~71mm CT Chest  04/05/22  Interval development of multifocal peribronchial nodular ground-glass pulmonary infiltrates, most severe within the right uppe//r lobe, likely infectious or inflammatory in the acute setting. Hyperinflation CT Chest 09/20/22 - Resolved RUL bronchopneumonia. Mild cylindrical bronchiectasis with micronodules and ground glass that are stable. No new nodules, remaining stable  PFT: 08/27/2018 FVC 2.78 (78 %) FEV1 1.76 (65 %) Ratio 63 TLC 117 % RV 175% RV/TLC 144% DLCO 107 % Interpretation: Moderate obstructive defect with air trapping.  No significant bronchodilator response.  Normal TLC.  Normal DLCO  Labs: Since 2015 absolute eos range from 400-600. 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 Well controlled on Symbicort. Discussed clinical course and management of COPD/asthma including bronchodilator regimen, preventive care including vaccinations and action plan for exacerbation.   Moderate persistent COPD asthma overlap- well controlled --CONTINUE Symbicort 160-4.5 mcg TWO puffs TWICE a day. OK to decrease to 1 puff in the morning and evening. --CONTINUE Xopenex inhaler as needed for chest tightness. OK to take before activity --CONTINUE Protonix   Multiple subsolid lung nodules - stable  micronodules Bronchiectasis Waxing and waning pulmonary nodules since [address].  Last CT on 08/2022 reviewed as noted above --ORDERED CT chest without contrast for 09/2023 --Please schedule Pulmonary appointment after CT scan   Health Maintenance Immunization History  Administered Date(s) Administered   Dtap, Unspecified 01/12/1950, 04/14/1955   Fluad Quad(high Dose 65+) 10/09/2018, 10/28/2020, 10/28/2021   Fluzone Influenza virus vaccine,trivalent (IIV3), split virus 10/27/2022   Influenza Split 10/30/2013   Influenza, High Dose Seasonal PF 10/25/2017, 10/17/2019, 10/28/2021, 10/27/2022   Influenza-Unspecified 10/19/2014, 10/19/2015, 10/27/2016, 10/28/2020, 10/28/2021, 10/27/2022   PFIZER Comirnaty(Gray Top)Covid-19 Tri-Sucrose Vaccine 06/17/2020   PFIZER(Purple Top)SARS-COV-2 Vaccination 02/12/2019, 03/05/2019, 10/30/2019, 06/17/2020, 11/23/2020   PNEUMOCOCCAL CONJUGATE-20 02/02/2023   Pfizer Covid Bivalent Pediatric Vaccine(35mos to <50yrs) 08/07/2022   Pfizer Covid-19 Vaccine Bivalent Booster 8yrs & up 11/23/2020, 06/17/2021, 10/13/2021   Pfizer(Comirnaty)Fall Seasonal Vaccine 12 years and older 10/13/2021, 08/07/2022, 11/23/2022, 11/23/2022   Pneumococcal Conjugate-13 11/28/2013   Pneumococcal Polysaccharide-23 03/29/2018   Polio, Unspecified 09/22/1953, 10/30/1953, 05/28/1954   Respiratory Syncytial Virus Vaccine,Recomb Aduvanted(Arexvy) 01/02/2022   Rsv, Bivalent, Protein Subunit Rsvpref,pf Verdis Frederickson) 01/02/2022   Zoster, Live 02/18/2009   Orders Placed This Encounter  Procedures   CT Chest Wo Contrast    Standing Status:   Future    Expiration Date:   04/29/2024    Scheduling Instructions:     Schedule in Sept 2025. Please schedule appointment end of Sept for CT follow-up    Preferred imaging location?:   [facility]    No orders of the defined types were placed in this encounter.   Return in about 5 months (around 09/30/2023) for after CT scan, with Dr. Everardo All.  I  have spent a total time of 31-minutes on the day of the appointment including chart review, data review, collecting history, coordinating care and discussing medical diagnosis and plan with the patient/family. Past medical history, allergies, medications were reviewed. Pertinent imaging, labs and tests included in this note have been reviewed and interpreted independently by me.  Hanad Leino Mechele Collin, MD [facility] 04/30/2023 9:56 AM  Office Number [phone]

## 2023-04-30 NOTE — Patient Instructions (Addendum)
 Moderate persistent COPD asthma overlap- well controlled --CONTINUE Symbicort 160-4.5 mcg TWO puffs TWICE a day. OK to decrease to 1 puff in the morning and evening. --CONTINUE Xopenex inhaler as needed for chest tightness. OK to take before activity --CONTINUE Protonix     Multiple subsolid lung nodules - stable micronodules Bronchiectasis Waxing and waning pulmonary nodules since [address].  Last CT on 08/2022 reviewed as noted above --CT chest without contrast for 09/2023 --Please schedule Pulmonary appointment after CT scan

## 2023-05-01 NOTE — Telephone Encounter (Signed)
 Spoke with patient and she has been scheduled for [facility] location so she is all set.

## 2023-05-10 ENCOUNTER — Other Ambulatory Visit (HOSPITAL_BASED_OUTPATIENT_CLINIC_OR_DEPARTMENT_OTHER)

## 2023-05-15 ENCOUNTER — Other Ambulatory Visit: Payer: Self-pay | Admitting: Internal Medicine

## 2023-05-21 ENCOUNTER — Ambulatory Visit (HOSPITAL_BASED_OUTPATIENT_CLINIC_OR_DEPARTMENT_OTHER)
Admission: RE | Admit: 2023-05-21 | Discharge: 2023-05-21 | Disposition: A | Discharge status: Home / Self Care | Source: Ambulatory Visit | Attending: Internal Medicine | Admitting: Internal Medicine

## 2023-05-21 DIAGNOSIS — M810 Age-related osteoporosis without current pathological fracture: Secondary | ICD-10-CM | POA: Insufficient documentation

## 2023-05-21 DIAGNOSIS — M85859 Other specified disorders of bone density and structure, unspecified thigh: Secondary | ICD-10-CM | POA: Diagnosis present

## 2023-05-21 DIAGNOSIS — Z780 Asymptomatic menopausal state: Secondary | ICD-10-CM | POA: Diagnosis not present

## 2023-05-22 ENCOUNTER — Encounter: Payer: Self-pay | Admitting: Internal Medicine

## 2023-05-22 ENCOUNTER — Other Ambulatory Visit: Payer: Self-pay | Admitting: Internal Medicine

## 2023-05-23 DIAGNOSIS — L2089 Other atopic dermatitis: Secondary | ICD-10-CM | POA: Diagnosis not present

## 2023-05-23 DIAGNOSIS — D692 Other nonthrombocytopenic purpura: Secondary | ICD-10-CM | POA: Diagnosis not present

## 2023-05-23 DIAGNOSIS — L309 Dermatitis, unspecified: Secondary | ICD-10-CM | POA: Diagnosis not present

## 2023-05-23 DIAGNOSIS — L718 Other rosacea: Secondary | ICD-10-CM | POA: Diagnosis not present

## 2023-05-23 DIAGNOSIS — D1721 Benign lipomatous neoplasm of skin and subcutaneous tissue of right arm: Secondary | ICD-10-CM | POA: Diagnosis not present

## 2023-05-30 ENCOUNTER — Ambulatory Visit: Payer: Medicare PPO | Admitting: Podiatry

## 2023-05-30 ENCOUNTER — Encounter: Payer: Self-pay | Admitting: Podiatry

## 2023-05-30 VITALS — Ht 67.0 in | Wt 121.1 lb

## 2023-05-30 DIAGNOSIS — B351 Tinea unguium: Secondary | ICD-10-CM | POA: Diagnosis not present

## 2023-05-30 DIAGNOSIS — M79675 Pain in left toe(s): Secondary | ICD-10-CM | POA: Diagnosis not present

## 2023-05-30 DIAGNOSIS — M79674 Pain in right toe(s): Secondary | ICD-10-CM | POA: Diagnosis not present

## 2023-06-03 NOTE — Progress Notes (Signed)
  Subjective:  Patient ID: Tina Lucas, female    DOB: [birth year],  MRN: [id number]  73 y.o. female presents painful mycotic toenails of both feet that are difficult to trim. Pain interferes with daily activities and wearing enclosed shoe gear comfortably. Chief Complaint  Patient presents with   Nail Problem    Pt is here for [facility] PCP is Dr Donnette Gal and LOV was in March.   New problem(s): None   PCP is Burns, Beckey Bourgeois, MD.  Allergies  Allergen Reactions   Singulair  [Montelukast  Sodium] Other (See Comments)    headaches    Review of Systems: Negative except as noted in the HPI.   Objective:  Tina Lucas is a pleasant 73 y.o. female WD, WN in NAD. AAO x 3.  Vascular Examination: Vascular status intact b/l with palpable pedal pulses. CFT immediate b/l. Pedal hair present. No edema. No pain with calf compression b/l. Skin temperature gradient WNL b/l. No varicosities noted. No cyanosis or clubbing noted.  Neurological Examination: Sensation grossly intact b/l with 10 gram monofilament. Vibratory sensation intact b/l.  Dermatological Examination: Pedal skin with normal turgor, texture and tone b/l. No open wounds nor interdigital macerations noted. Toenails 3-5 b/l thick, discolored, elongated with subungual debris and pain on dorsal palpation. Anonychia b/l 2nd toes. Bilateral great toes adequate length. No hyperkeratotic lesions noted b/l.   Musculoskeletal Examination: Muscle strength 5/5 to b/l LE.  No pain, crepitus noted b/l. HAV with bunion deformity noted b/l LE.  Radiographs: None  Last A1c:       No data to display         Assessment:   1. Pain due to onychomycosis of toenails of both feet     Plan:  -Consent given for treatment as described below: -Examined patient. -Patient to continue soft, supportive shoe gear daily. -Mycotic toenails 3-5 bilaterally were debrided in length and girth with sterile nail nippers and dremel without iatrogenic  bleeding. -Patient/POA to call should there be question/concern in the interim.  No follow-ups on file.  Tina Lucas, DPM      [place] LOCATION: [address] [place] [address]                   Office [phone]   [facility] LOCATION: [address] [place], [place] [address] Office [phone]

## 2023-07-18 DIAGNOSIS — Z1231 Encounter for screening mammogram for malignant neoplasm of breast: Secondary | ICD-10-CM | POA: Diagnosis not present

## 2023-07-18 LAB — HM MAMMOGRAPHY

## 2023-07-20 ENCOUNTER — Encounter: Payer: Self-pay | Admitting: Internal Medicine

## 2023-08-01 DIAGNOSIS — H01132 Eczematous dermatitis of right lower eyelid: Secondary | ICD-10-CM | POA: Diagnosis not present

## 2023-08-01 DIAGNOSIS — H04123 Dry eye syndrome of bilateral lacrimal glands: Secondary | ICD-10-CM | POA: Diagnosis not present

## 2023-08-01 DIAGNOSIS — H01131 Eczematous dermatitis of right upper eyelid: Secondary | ICD-10-CM | POA: Diagnosis not present

## 2023-08-01 DIAGNOSIS — Z961 Presence of intraocular lens: Secondary | ICD-10-CM | POA: Diagnosis not present

## 2023-08-01 DIAGNOSIS — H01135 Eczematous dermatitis of left lower eyelid: Secondary | ICD-10-CM | POA: Diagnosis not present

## 2023-08-01 DIAGNOSIS — H01134 Eczematous dermatitis of left upper eyelid: Secondary | ICD-10-CM | POA: Diagnosis not present

## 2023-08-09 ENCOUNTER — Telehealth (HOSPITAL_BASED_OUTPATIENT_CLINIC_OR_DEPARTMENT_OTHER): Payer: Self-pay | Admitting: Pulmonary Disease

## 2023-08-09 NOTE — Telephone Encounter (Signed)
 Patient needs repeat CT prior to appointment 9/15. Please call to schedule.

## 2023-08-13 DIAGNOSIS — H01132 Eczematous dermatitis of right lower eyelid: Secondary | ICD-10-CM | POA: Diagnosis not present

## 2023-08-13 DIAGNOSIS — H01134 Eczematous dermatitis of left upper eyelid: Secondary | ICD-10-CM | POA: Diagnosis not present

## 2023-08-13 DIAGNOSIS — H01131 Eczematous dermatitis of right upper eyelid: Secondary | ICD-10-CM | POA: Diagnosis not present

## 2023-08-13 DIAGNOSIS — H01135 Eczematous dermatitis of left lower eyelid: Secondary | ICD-10-CM | POA: Diagnosis not present

## 2023-09-12 ENCOUNTER — Encounter: Payer: Self-pay | Admitting: Podiatry

## 2023-09-12 ENCOUNTER — Ambulatory Visit: Admitting: Podiatry

## 2023-09-12 DIAGNOSIS — B351 Tinea unguium: Secondary | ICD-10-CM | POA: Diagnosis not present

## 2023-09-12 DIAGNOSIS — M79675 Pain in left toe(s): Secondary | ICD-10-CM

## 2023-09-12 DIAGNOSIS — M79674 Pain in right toe(s): Secondary | ICD-10-CM

## 2023-09-17 ENCOUNTER — Encounter: Payer: Self-pay | Admitting: Podiatry

## 2023-09-17 NOTE — Progress Notes (Signed)
  Subjective:  Patient ID: Tina Lucas, female    DOB: 09-25-1950,  MRN: [id number]  Tina Lucas presents to clinic today for preventative diabetic foot care for painful, discolored, thick toenails which interfere with daily activities  Chief Complaint  Patient presents with   RFC     RFC Non diabetic A1C 5.1. Toienail trim. LOV with PVP 03/2023.   New problem(s): None.   PCP is Geofm Glade PARAS, MD.  Allergies  Allergen Reactions   Singulair  [Montelukast  Sodium] Other (See Comments)    headaches    Review of Systems: Negative except as noted in the HPI.  Objective: No changes noted in today's physical examination. There were no vitals filed for this visit. Tina Lucas is a pleasant 73 y.o. female WD, WN in NAD. AAO x 3.  Vascular Examination: Vascular status intact b/l with palpable pedal pulses. CFT immediate b/l. Pedal hair present. No edema. No pain with calf compression b/l. Skin temperature gradient WNL b/l. No varicosities noted. No cyanosis or clubbing noted.  Neurological Examination: Sensation grossly intact b/l with 10 gram monofilament. Vibratory sensation intact b/l.  Dermatological Examination: Pedal skin with normal turgor, texture and tone b/l. No open wounds nor interdigital macerations noted. Toenails 3-5 b/l thick, discolored, elongated with subungual debris and pain on dorsal palpation. Anonychia b/l 2nd toes. Bilateral great toes adequate length. No hyperkeratotic lesions noted b/l.   Musculoskeletal Examination: Muscle strength 5/5 to b/l LE.  No pain, crepitus noted b/l. HAV with bunion deformity noted b/l LE.  Radiographs: None  Assessment/Plan: 1. Pain due to onychomycosis of toenails of both feet     Patient was evaluated and treated. All patient's and/or POA's questions/concerns addressed on today's visit. Toenails bilateral great toes and 3-5 bilaterally debrided in length and girth without incident. Continue soft, supportive shoe gear daily.  Report any pedal injuries to medical professional. Call office if there are any questions/concerns. -Patient/POA to call should there be question/concern in the interim.   Return in about 4 months (around 01/12/2024).  Tina Lucas, DPM      [place] LOCATION: [address], [place] [address]                   Office [phone]   [facility] LOCATION: [address] [place], [place] [address] Office [phone]

## 2023-09-25 ENCOUNTER — Other Ambulatory Visit (HOSPITAL_BASED_OUTPATIENT_CLINIC_OR_DEPARTMENT_OTHER)

## 2023-09-28 ENCOUNTER — Ambulatory Visit (HOSPITAL_BASED_OUTPATIENT_CLINIC_OR_DEPARTMENT_OTHER)
Admission: RE | Admit: 2023-09-28 | Discharge: 2023-09-28 | Disposition: A | Discharge status: Home / Self Care | Source: Ambulatory Visit | Attending: Pulmonary Disease | Admitting: Pulmonary Disease

## 2023-09-28 DIAGNOSIS — J479 Bronchiectasis, uncomplicated: Secondary | ICD-10-CM | POA: Diagnosis not present

## 2023-09-28 DIAGNOSIS — R918 Other nonspecific abnormal finding of lung field: Secondary | ICD-10-CM | POA: Insufficient documentation

## 2023-09-28 DIAGNOSIS — J439 Emphysema, unspecified: Secondary | ICD-10-CM | POA: Diagnosis not present

## 2023-10-08 ENCOUNTER — Ambulatory Visit (HOSPITAL_BASED_OUTPATIENT_CLINIC_OR_DEPARTMENT_OTHER): Admitting: Pulmonary Disease

## 2023-10-08 ENCOUNTER — Encounter (HOSPITAL_BASED_OUTPATIENT_CLINIC_OR_DEPARTMENT_OTHER): Payer: Self-pay | Admitting: Pulmonary Disease

## 2023-10-08 VITALS — BP 126/82 | HR 78 | Ht 67.0 in | Wt 119.6 lb

## 2023-10-08 DIAGNOSIS — J4489 Other specified chronic obstructive pulmonary disease: Secondary | ICD-10-CM

## 2023-10-08 DIAGNOSIS — R918 Other nonspecific abnormal finding of lung field: Secondary | ICD-10-CM | POA: Diagnosis not present

## 2023-10-08 DIAGNOSIS — J479 Bronchiectasis, uncomplicated: Secondary | ICD-10-CM

## 2023-10-08 MED ORDER — BUDESONIDE-FORMOTEROL FUMARATE 160-4.5 MCG/ACT IN AERO: 2.0000 | INHALATION_SPRAY | Freq: Two times a day (BID) | RESPIRATORY_TRACT | 11 refills | Status: AC

## 2023-10-08 MED ORDER — LEVALBUTEROL TARTRATE 45 MCG/ACT IN AERO: 1.0000 | INHALATION_SPRAY | RESPIRATORY_TRACT | 2 refills | Status: AC

## 2023-10-08 NOTE — Patient Instructions (Addendum)
  Moderate persistent COPD asthma overlap- well controlled --CONTINUE Symbicort  160-4.5 mcg TWO puffs TWICE a day. OK to decrease to 1 puff in the morning and evening. --CONTINUE Xopenex  inhaler as needed for chest tightness. OK to take before activity --CONTINUE Protonix --Planning for influenza and covid vaccine this year   Multiple subsolid lung nodules - overall stable micronodules Bronchiectasis Waxing and waning pulmonary nodules since [address].  Last CT on 09/2023 reviewed as noted above --ORDER CT chest without contrast for 09/2024

## 2023-10-08 NOTE — Progress Notes (Signed)
 Subjective:   PATIENT ID: [place]: female DOB: November 03, 1950, MRN: [id number]   HPI  Chief Complaint  Patient presents with   Follow-up    Lung nodules   Asthma    Reason for Visit: Follow-up  [name] is a 73 year old female never smoker with childhood asthma (shots, nebulizers and inhalers) who presents for follow-up.  Synopsis Established care with [facility] in 2020. Previously seen by pulmonologist (Dr. Lamar Gentry) for asthma until early adulthood.  She was advised to stop Advair at age 12 because she outgrew asthma and required minimal treatment.  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  Followed for COPD/asthma and pulmonary nodules.  2020 PFT with obstructive defect. Started on high dose Dulera  2021 No exacerbations. No changes in meds. Waxing and waning pulmonary nodules monitored 2022 SOB worsened when decreasing BID PPI 2023 Remains on Dulera  and Xopenex  daily. Remains active in pickleball.  2024 Changed from Dulera  to Symbicort  due to insurance. Pneumonia in March  09/29/22 Since our last visit she is compliant with Symbicort  with TWO puffs twice a day. Uses Xopenex  prior to exercise 6-7 times a week. Continues to play pickleball 3x a week and walking 2 days a week for over an hour (4-5 miles around [facility]). Denies shortness of breath except with heavy exertion. Doesn't need rescue with walking. Denies wheezing or coughing.   04/30/23 Since our last visit she has been compliant with her Symbicort . She uses levalbuterol  1-2 puffs 4-7 times a week prior to work-outs. Shortness of breath with high intensity activity. Denies wheezes or cough. Continues to play pickleball and walks daily at least. Will play pickleball on average 2-3 hour sessions. No exacerbations since our last visit.  10/08/23 Since our last visit she has had no changes with her inhalers. She uses Symbicort  2 puffs twice a  day and uses levalbuterol  1-2 puffs 5-7 times a week before exercise when she is walking 3-4 miles. Still playing pickleball. Denies coughing or wheezing. Remains active.   Prior inhalers: Dulera  stopped due to non-formulary on insurance  Asthma Control Test ACT Total Score  04/30/2023  9:45 AM 24  04/13/2022  9:23 AM 22  02/16/2020  9:29 AM 12   Social History: Never smoker Retired Runner, broadcasting/film/video MS degree in exercise physiology No exposures to COVID-19.  She lives in [place]  near [place] grandchildren  Environmental exposures:  Significant second-hand (father) smoke exposure and noticeably improved after moving for college.   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     celecoxib  (CELEBREX ) 100 MG capsule TAKE 1 CAPSULE BY MOUTH DAILY 90 capsule 3   D 1000 25 MCG (1000 UT) capsule SMARTSIG:1 By Mouth     Digestive Enzymes (  MULTI-ENZYME PO) Take by mouth daily before breakfast. Patient takes Pure Encapsulations Digestive Enzymes Ultra     fexofenadine (ALLEGRA) 180 MG tablet Take 180 mg by mouth daily.     fluocinonide  cream (LIDEX ) 0.05 % Apply 1 application topically 2 (two) times daily as needed (FOR PSORIASIS).      hydrocortisone 2.5 % cream      levothyroxine  (SYNTHROID ) 50 MCG tablet TAKE 1 TABLET BY MOUTH DAILY WITH BREAKFAST 90 tablet  3   Magnesium  250 MG TABS Take 125 mg by mouth daily.     Melatonin 5 MG CHEW Chew by mouth at bedtime.     metroNIDAZOLE (METROCREAM) 0.75 % cream Apply 1 Application topically 2 (two) times daily.     Multiple Vitamins-Minerals (ONE-A-DAY WOMENS 50+ ADVANTAGE PO) Take by mouth daily.     Probiotic Product (PROBIOTIC PO) Take by mouth daily before breakfast. Patient takes Lysle Stacks Probiotic Eleven (one at breakfast)     sertraline  (ZOLOFT ) 25 MG tablet TAKE 1/2 TABLET BY MOUTH DAILY 45 tablet 3   budesonide -formoterol  (SYMBICORT ) 160-4.5 MCG/ACT inhaler Inhale 2 puffs into the lungs in the morning and at bedtime. 1 each 6   levalbuterol  (XOPENEX  HFA) 45 MCG/ACT inhaler INHALE ONE TO TWO PUFFS BY MOUTH EVERY 4 HOURS AS NEEDED FOR WHEEZING 15 g 2   No facility-administered medications prior to visit.    Review of Systems  Constitutional:  Negative for chills, diaphoresis, fever, malaise/fatigue and weight loss.  HENT:  Negative for congestion.   Respiratory:  Negative for cough, hemoptysis, sputum production, shortness of breath and wheezing.   Cardiovascular:  Negative for chest pain, palpitations and leg swelling.    Objective:   Vitals:   10/08/23 0912  BP: 126/82  Pulse: 78  SpO2: 98%  Weight: 119 lb 9.6 oz (54.3 kg)  Height: 5' 7 (1.702 m)    SpO2: 98 %  Physical Exam: General: Well-appearing, no acute distress HENT: [place], AT Eyes: EOMI, no scleral icterus Respiratory: Clear to auscultation bilaterally.  No crackles, wheezing or rales Cardiovascular: RRR, -M/R/G, no JVD Extremities:-Edema,-tenderness Neuro: AAO x4, CNII-XII grossly intact Psych: Normal mood, normal affect   Data Reviewed:  Imaging: CT Chest 08/01/18- RUL GGO nodule measured 6mm and solid nodule measured 5mm CT Chest 01/30/19 - RUL nodule increased to 9mm. Scattered lung nodules <79mm in left and right upper lobes. CT Chest 02/16/19 - Unchanged prior lung nodules. Interval solid 5mm RUL and  multifocal ground glass nodular opacities. CT Chest 01/30/20 - Interval development of solid 5mm nodule in RUL with adjacent smaller nodes and ground glass nodule in RUL 11x74mm. Remaininig nodules unchanged. CT Chest 08/04/20 - New 7 mm nodule in the anterior right lung apex with mild subpleural retraction. Previously seen 5 and 11 mm nodules in the right upper lobe have resolved. New tiny clustered centrilobular nodules in the central right lower lobe, nonspecific resolved CT Chest 11/15/20 - Interval RLL subsolid nodule ~40mm. Similar/slightly decreased lesions in RUL and LUL CT chest 02/15/2021- Resolved right lower lobe nodule, no longer present.  Stable right upper lobe nodules ~68mm CT Chest  04/05/22  Interval development of multifocal peribronchial nodular ground-glass pulmonary infiltrates, most severe within the right uppe//r lobe, likely infectious or inflammatory in the acute setting. Hyperinflation CT Chest 09/20/22 - Resolved RUL bronchopneumonia. Mild cylindrical bronchiectasis with micronodules and ground glass that are stable. No new nodules, remaining stable CT Chest 09/28/23 - Stable scattered small nodules. Improved prior peribronchovascular nodules. Mild emphysematous changes.  PFT: 08/27/2018 FVC 2.78 (78 %) FEV1 1.76 (65 %) Ratio 63 TLC 117 % RV 175% RV/TLC 144% DLCO 107 % Interpretation: Moderate obstructive defect with air trapping.  No significant bronchodilator response.  Normal TLC.  Normal DLCO  Labs: Since 2015 absolute eos range from 400-600. 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 Discussed clinical course and management of COPD/asthma including bronchodilator regimen, preventive care including vaccinations and action plan for exacerbation.  Moderate persistent COPD asthma  overlap- well controlled --CONTINUE Symbicort  160-4.5 mcg TWO puffs TWICE a day. OK to decrease to 1 puff in the morning and evening. --CONTINUE Xopenex  inhaler as needed for chest tightness. OK to take before activity --CONTINUE Protonix --Planning for influenza and covid vaccine this year   Multiple subsolid lung nodules - stable micronodules Bronchiectasis Waxing and waning pulmonary nodules since [address].  Last CT on 09/2023 reviewed as noted above --ORDER CT chest without contrast for 09/2024 --Please schedule Pulmonary appointment after CT scan   Health Maintenance Immunization History  Administered Date(s) Administered    sv, Bivalent, Protein Subunit Rsvpref,pf [place]) 01/02/2022   Dtap, Unspecified 01/12/1950, 04/14/1955   Fluad Quad(high Dose 65+) 10/09/2018, 10/28/2020, 10/28/2021   Fluzone Influenza virus vaccine,trivalent (IIV3), split virus 10/27/2022   INFLUENZA, HIGH DOSE SEASONAL PF 10/25/2017, 10/17/2019, 10/28/2021, 10/27/2022   Influenza Split 10/30/2013   Influenza-Unspecified 10/19/2014, 10/19/2015, 10/27/2016, 10/28/2020, 10/28/2021, 10/27/2022   PFIZER Comirnaty(Gray Top)Covid-19 Tri-Sucrose Vaccine 06/17/2020   PFIZER(Purple Top)SARS-COV-2 Vaccination 02/12/2019, 03/05/2019, 10/30/2019, 06/17/2020, 11/23/2020   PNEUMOCOCCAL CONJUGATE-20 02/02/2023   Pfizer Covid Bivalent Pediatric Vaccine(72mos to <57yrs) 08/07/2022   Pfizer Covid-19 Vaccine Bivalent Booster 43yrs & up 11/23/2020, 06/17/2021, 10/13/2021   Pfizer(Comirnaty)Fall Seasonal Vaccine 12 years and older 10/13/2021, 08/07/2022, 11/23/2022, 11/23/2022   Pneumococcal Conjugate-13 11/28/2013   Pneumococcal Polysaccharide-23 03/29/2018   Polio, Unspecified 09/22/1953, 10/30/1953, 05/28/1954   Respiratory Syncytial Virus Vaccine,Recomb Aduvanted(Arexvy) 01/02/2022   Zoster, Live 02/18/2009   Orders Placed This Encounter  Procedures   CT Chest Wo Contrast    Standing Status:   Future    Expected Date:    09/23/2024    Expiration Date:   12/22/2024    Scheduling Instructions:     Schedule for September 2026    Preferred imaging location?:   [facility]    Meds ordered this encounter  Medications   levalbuterol  (XOPENEX  HFA) 45 MCG/ACT inhaler    Sig: Inhale 1-2 puffs into the lungs every 4 (four) hours.    Dispense:  15 g    Refill:  2   budesonide -formoterol  (SYMBICORT ) 160-4.5 MCG/ACT inhaler    Sig: Inhale 2 puffs into the lungs in the morning and at bedtime.    Dispense:  1 each    Refill:  11    Return in about 1 year (around 10/07/2024).  I have spent a total time of 31-minutes on the day of the appointment including chart review, data review, collecting history, coordinating care and discussing medical diagnosis and plan with the patient/family. Past medical history, allergies, medications were reviewed. Pertinent imaging, labs and tests included in this note have been reviewed and interpreted independently by me.  Eyal Greenhaw Slater Staff, MD [facility] 10/08/2023 9:32 AM

## 2023-11-13 DIAGNOSIS — H01131 Eczematous dermatitis of right upper eyelid: Secondary | ICD-10-CM | POA: Diagnosis not present

## 2023-11-13 DIAGNOSIS — H01132 Eczematous dermatitis of right lower eyelid: Secondary | ICD-10-CM | POA: Diagnosis not present

## 2023-11-13 DIAGNOSIS — H01134 Eczematous dermatitis of left upper eyelid: Secondary | ICD-10-CM | POA: Diagnosis not present

## 2023-11-13 DIAGNOSIS — H01135 Eczematous dermatitis of left lower eyelid: Secondary | ICD-10-CM | POA: Diagnosis not present

## 2023-11-13 DIAGNOSIS — H1013 Acute atopic conjunctivitis, bilateral: Secondary | ICD-10-CM | POA: Diagnosis not present

## 2023-11-21 DIAGNOSIS — R21 Rash and other nonspecific skin eruption: Secondary | ICD-10-CM | POA: Diagnosis not present

## 2023-11-21 DIAGNOSIS — D1721 Benign lipomatous neoplasm of skin and subcutaneous tissue of right arm: Secondary | ICD-10-CM | POA: Diagnosis not present

## 2023-11-21 DIAGNOSIS — D692 Other nonthrombocytopenic purpura: Secondary | ICD-10-CM | POA: Diagnosis not present

## 2023-11-21 DIAGNOSIS — L2089 Other atopic dermatitis: Secondary | ICD-10-CM | POA: Diagnosis not present

## 2023-11-21 DIAGNOSIS — D225 Melanocytic nevi of trunk: Secondary | ICD-10-CM | POA: Diagnosis not present

## 2023-11-21 DIAGNOSIS — L218 Other seborrheic dermatitis: Secondary | ICD-10-CM | POA: Diagnosis not present

## 2023-11-21 DIAGNOSIS — L718 Other rosacea: Secondary | ICD-10-CM | POA: Diagnosis not present

## 2023-11-21 DIAGNOSIS — L245 Irritant contact dermatitis due to other chemical products: Secondary | ICD-10-CM | POA: Diagnosis not present

## 2023-12-24 DIAGNOSIS — H0288A Meibomian gland dysfunction right eye, upper and lower eyelids: Secondary | ICD-10-CM | POA: Diagnosis not present

## 2023-12-24 DIAGNOSIS — H1013 Acute atopic conjunctivitis, bilateral: Secondary | ICD-10-CM | POA: Diagnosis not present

## 2023-12-24 DIAGNOSIS — Z961 Presence of intraocular lens: Secondary | ICD-10-CM | POA: Diagnosis not present

## 2023-12-24 DIAGNOSIS — H0288B Meibomian gland dysfunction left eye, upper and lower eyelids: Secondary | ICD-10-CM | POA: Diagnosis not present

## 2023-12-24 DIAGNOSIS — H04123 Dry eye syndrome of bilateral lacrimal glands: Secondary | ICD-10-CM | POA: Diagnosis not present

## 2023-12-26 ENCOUNTER — Ambulatory Visit: Admitting: Podiatry

## 2023-12-26 ENCOUNTER — Encounter: Payer: Self-pay | Admitting: Podiatry

## 2023-12-26 DIAGNOSIS — M79674 Pain in right toe(s): Secondary | ICD-10-CM | POA: Diagnosis not present

## 2023-12-26 DIAGNOSIS — B351 Tinea unguium: Secondary | ICD-10-CM

## 2023-12-26 DIAGNOSIS — M79675 Pain in left toe(s): Secondary | ICD-10-CM

## 2023-12-30 NOTE — Progress Notes (Signed)
  Subjective:  Patient ID: [place], female    DOB: August 13, 1950,  MRN: [id number]  [place] presents to clinic today for painful mycotic toenails of both feet that are difficult to trim. Pain interferes with daily activities and wearing enclosed shoe gear comfortably.  Chief Complaint  Patient presents with   RFC    Rm17 Routine foot care/ Dr. Glade Hope last visit    New problem(s): None.   PCP is Hope Glade PARAS, MD. [place] 04/18/23.  Allergies  Allergen Reactions   Singulair  [Montelukast  Sodium] Other (See Comments)    headaches    Review of Systems: Negative except as noted in the HPI.  Objective: No changes noted in today's physical examination. There were no vitals filed for this visit. [place] is a pleasant 73 y.o. female WD, WN in NAD. AAO x 3.  Vascular Examination: Vascular status intact b/l with palpable pedal pulses. CFT immediate b/l. Pedal hair present. No edema. No pain with calf compression b/l. Skin temperature gradient WNL b/l. No varicosities noted. No cyanosis or clubbing noted.  Neurological Examination: Sensation grossly intact b/l with 10 gram monofilament. Vibratory sensation intact b/l.  Dermatological Examination: Pedal skin with normal turgor, texture and tone b/l. No open wounds nor interdigital macerations noted. Toenails 3-5 b/l thick, discolored, elongated with subungual debris and pain on dorsal palpation. Anonychia b/l 2nd toes. Bilateral great toes adequate length. No hyperkeratotic lesions noted b/l.   Musculoskeletal Examination: Muscle strength 5/5 to b/l LE.  No pain, crepitus noted b/l. HAV with bunion deformity noted b/l LE.  Assessment/Plan: 1. Pain due to onychomycosis of toenails of both feet   -Consent given for treatment as described below: -Examined patient. -Patient to continue soft, supportive shoe gear daily. -Mycotic toenails 3-5 bilaterally were debrided in length and girth with sterile nail nippers and dremel  without iatrogenic bleeding. -Patient/POA to call should there be question/concern in the interim.   Return in about 3 months (around 03/25/2024).  [place], DPM      [place] LOCATION: [address], [place] [address]                   Office [phone]   [facility] LOCATION: [address] [place], [place] [address] Office [phone]

## 2024-04-09 ENCOUNTER — Ambulatory Visit: Admitting: Podiatry

## 2024-04-21 ENCOUNTER — Encounter: Admitting: Internal Medicine
